# Patient Record
Sex: Female | Born: 1945 | Hispanic: No | Marital: Married | Smoking: Never smoker
Health system: Southern US, Community
[De-identification: ages and names within clinical notes are randomized; demographics above are authoritative.]

## PROBLEM LIST (undated history)

## (undated) DIAGNOSIS — J189 Pneumonia, unspecified organism: Secondary | ICD-10-CM

## (undated) DIAGNOSIS — N289 Disorder of kidney and ureter, unspecified: Secondary | ICD-10-CM

## (undated) DIAGNOSIS — R5600 Simple febrile convulsions: Secondary | ICD-10-CM

## (undated) DIAGNOSIS — K219 Gastro-esophageal reflux disease without esophagitis: Secondary | ICD-10-CM

## (undated) DIAGNOSIS — C539 Malignant neoplasm of cervix uteri, unspecified: Secondary | ICD-10-CM

## (undated) DIAGNOSIS — E785 Hyperlipidemia, unspecified: Secondary | ICD-10-CM

## (undated) DIAGNOSIS — Z8719 Personal history of other diseases of the digestive system: Secondary | ICD-10-CM

## (undated) DIAGNOSIS — M109 Gout, unspecified: Secondary | ICD-10-CM

## (undated) DIAGNOSIS — F329 Major depressive disorder, single episode, unspecified: Secondary | ICD-10-CM

## (undated) DIAGNOSIS — Z992 Dependence on renal dialysis: Secondary | ICD-10-CM

## (undated) DIAGNOSIS — M797 Fibromyalgia: Secondary | ICD-10-CM

## (undated) DIAGNOSIS — F32A Depression, unspecified: Secondary | ICD-10-CM

## (undated) DIAGNOSIS — Z9289 Personal history of other medical treatment: Secondary | ICD-10-CM

## (undated) DIAGNOSIS — A759 Typhus fever, unspecified: Secondary | ICD-10-CM

## (undated) DIAGNOSIS — E119 Type 2 diabetes mellitus without complications: Secondary | ICD-10-CM

## (undated) DIAGNOSIS — I10 Essential (primary) hypertension: Secondary | ICD-10-CM

## (undated) DIAGNOSIS — D649 Anemia, unspecified: Secondary | ICD-10-CM

## (undated) DIAGNOSIS — C649 Malignant neoplasm of unspecified kidney, except renal pelvis: Secondary | ICD-10-CM

## (undated) DIAGNOSIS — G43909 Migraine, unspecified, not intractable, without status migrainosus: Secondary | ICD-10-CM

## (undated) DIAGNOSIS — M545 Low back pain, unspecified: Secondary | ICD-10-CM

## (undated) DIAGNOSIS — J42 Unspecified chronic bronchitis: Secondary | ICD-10-CM

## (undated) DIAGNOSIS — F419 Anxiety disorder, unspecified: Secondary | ICD-10-CM

## (undated) DIAGNOSIS — G8929 Other chronic pain: Secondary | ICD-10-CM

## (undated) DIAGNOSIS — G629 Polyneuropathy, unspecified: Secondary | ICD-10-CM

## (undated) DIAGNOSIS — E215 Disorder of parathyroid gland, unspecified: Secondary | ICD-10-CM

## (undated) DIAGNOSIS — G4730 Sleep apnea, unspecified: Secondary | ICD-10-CM

## (undated) DIAGNOSIS — M810 Age-related osteoporosis without current pathological fracture: Secondary | ICD-10-CM

## (undated) HISTORY — DX: Malignant neoplasm of cervix uteri, unspecified: C53.9

## (undated) HISTORY — DX: Malignant neoplasm of unspecified kidney, except renal pelvis: C64.9

## (undated) HISTORY — PX: UPPER GI ENDOSCOPY: SHX6162

## (undated) HISTORY — DX: Age-related osteoporosis without current pathological fracture: M81.0

## (undated) HISTORY — DX: Depression, unspecified: F32.A

## (undated) HISTORY — PX: APPENDECTOMY: SHX54

## (undated) HISTORY — DX: Hyperlipidemia, unspecified: E78.5

## (undated) HISTORY — PX: BILATERAL SALPINGOOPHORECTOMY: SHX1223

## (undated) HISTORY — PX: BACK SURGERY: SHX140

## (undated) HISTORY — PX: HERNIA REPAIR: SHX51

## (undated) HISTORY — PX: CARDIAC CATHETERIZATION: SHX172

## (undated) HISTORY — PX: EYE SURGERY: SHX253

## (undated) HISTORY — DX: Fibromyalgia: M79.7

## (undated) HISTORY — DX: Essential (primary) hypertension: I10

## (undated) HISTORY — DX: Major depressive disorder, single episode, unspecified: F32.9

## (undated) HISTORY — DX: Polyneuropathy, unspecified: G62.9

## (undated) HISTORY — DX: Personal history of other medical treatment: Z92.89

## (undated) HISTORY — PX: DILATION AND CURETTAGE OF UTERUS: SHX78

---

## 1973-02-12 HISTORY — PX: DIAGNOSTIC LAPAROSCOPY: SUR761

## 1974-02-12 HISTORY — PX: TONSILLECTOMY: SUR1361

## 1975-02-13 HISTORY — PX: ABDOMINAL HYSTERECTOMY: SHX81

## 1981-02-12 HISTORY — PX: INCONTINENCE SURGERY: SHX676

## 1986-02-12 HISTORY — PX: TMJ ARTHROPLASTY: SHX1066

## 1986-02-12 HISTORY — PX: CYSTOSCOPY W/ STONE MANIPULATION: SHX1427

## 1988-10-13 HISTORY — PX: LAPAROSCOPIC CHOLECYSTECTOMY: SUR755

## 1989-02-12 HISTORY — PX: MOUTH SURGERY: SHX715

## 1994-02-12 HISTORY — PX: HIATAL HERNIA REPAIR: SHX195

## 1995-02-13 HISTORY — PX: ESOPHAGOGASTRODUODENOSCOPY (EGD) WITH ESOPHAGEAL DILATION: SHX5812

## 1998-10-14 HISTORY — PX: KNEE ARTHROSCOPY: SUR90

## 1999-01-24 ENCOUNTER — Encounter: Payer: Self-pay | Admitting: Specialist

## 1999-01-30 ENCOUNTER — Encounter (INDEPENDENT_AMBULATORY_CARE_PROVIDER_SITE_OTHER): Payer: Self-pay

## 1999-01-30 ENCOUNTER — Observation Stay (HOSPITAL_COMMUNITY): Admission: RE | Admit: 1999-01-30 | Discharge: 1999-01-31 | Payer: Self-pay | Admitting: Specialist

## 1999-01-30 ENCOUNTER — Encounter: Payer: Self-pay | Admitting: Specialist

## 1999-02-13 HISTORY — PX: LUMBAR DISC SURGERY: SHX700

## 2000-02-09 ENCOUNTER — Ambulatory Visit (HOSPITAL_COMMUNITY): Admission: RE | Admit: 2000-02-09 | Discharge: 2000-02-09 | Payer: Self-pay | Admitting: Specialist

## 2000-02-09 ENCOUNTER — Encounter: Payer: Self-pay | Admitting: Specialist

## 2000-02-13 HISTORY — PX: NEPHRECTOMY: SHX65

## 2000-06-25 ENCOUNTER — Ambulatory Visit (HOSPITAL_COMMUNITY): Admission: RE | Admit: 2000-06-25 | Discharge: 2000-06-25 | Payer: Self-pay | Admitting: Internal Medicine

## 2000-10-15 ENCOUNTER — Encounter: Payer: Self-pay | Admitting: Internal Medicine

## 2000-10-15 ENCOUNTER — Ambulatory Visit (HOSPITAL_COMMUNITY): Admission: RE | Admit: 2000-10-15 | Discharge: 2000-10-15 | Payer: Self-pay | Admitting: Internal Medicine

## 2000-10-30 ENCOUNTER — Ambulatory Visit (HOSPITAL_COMMUNITY): Admission: RE | Admit: 2000-10-30 | Discharge: 2000-10-30 | Payer: Self-pay | Admitting: Internal Medicine

## 2000-10-31 ENCOUNTER — Encounter: Payer: Self-pay | Admitting: Internal Medicine

## 2000-11-14 ENCOUNTER — Encounter: Payer: Self-pay | Admitting: Anesthesiology

## 2000-11-20 ENCOUNTER — Encounter (INDEPENDENT_AMBULATORY_CARE_PROVIDER_SITE_OTHER): Payer: Self-pay | Admitting: *Deleted

## 2000-11-20 ENCOUNTER — Inpatient Hospital Stay (HOSPITAL_COMMUNITY): Admission: RE | Admit: 2000-11-20 | Discharge: 2000-11-25 | Payer: Self-pay | Admitting: Urology

## 2000-11-22 ENCOUNTER — Encounter: Payer: Self-pay | Admitting: Internal Medicine

## 2000-12-17 ENCOUNTER — Ambulatory Visit (HOSPITAL_COMMUNITY): Admission: RE | Admit: 2000-12-17 | Discharge: 2000-12-17 | Payer: Self-pay | Admitting: Internal Medicine

## 2000-12-17 ENCOUNTER — Encounter: Payer: Self-pay | Admitting: Internal Medicine

## 2001-01-07 ENCOUNTER — Emergency Department (HOSPITAL_COMMUNITY): Admission: EM | Admit: 2001-01-07 | Discharge: 2001-01-07 | Payer: Self-pay | Admitting: Emergency Medicine

## 2001-01-07 ENCOUNTER — Encounter: Payer: Self-pay | Admitting: Emergency Medicine

## 2001-02-11 ENCOUNTER — Ambulatory Visit (HOSPITAL_COMMUNITY): Admission: RE | Admit: 2001-02-11 | Discharge: 2001-02-11 | Payer: Self-pay | Admitting: Internal Medicine

## 2001-02-11 ENCOUNTER — Encounter: Payer: Self-pay | Admitting: Internal Medicine

## 2001-02-13 ENCOUNTER — Ambulatory Visit (HOSPITAL_COMMUNITY): Admission: RE | Admit: 2001-02-13 | Discharge: 2001-02-13 | Payer: Self-pay | Admitting: Internal Medicine

## 2001-02-13 ENCOUNTER — Encounter: Payer: Self-pay | Admitting: Internal Medicine

## 2001-02-27 ENCOUNTER — Ambulatory Visit (HOSPITAL_COMMUNITY): Admission: RE | Admit: 2001-02-27 | Discharge: 2001-02-27 | Payer: Self-pay | Admitting: Internal Medicine

## 2001-02-27 ENCOUNTER — Encounter: Payer: Self-pay | Admitting: Internal Medicine

## 2001-04-15 ENCOUNTER — Encounter: Payer: Self-pay | Admitting: Internal Medicine

## 2001-04-15 ENCOUNTER — Ambulatory Visit (HOSPITAL_COMMUNITY): Admission: RE | Admit: 2001-04-15 | Discharge: 2001-04-15 | Payer: Self-pay | Admitting: Internal Medicine

## 2001-07-31 ENCOUNTER — Emergency Department (HOSPITAL_COMMUNITY): Admission: EM | Admit: 2001-07-31 | Discharge: 2001-07-31 | Payer: Self-pay | Admitting: Emergency Medicine

## 2001-07-31 ENCOUNTER — Encounter: Payer: Self-pay | Admitting: Emergency Medicine

## 2001-08-12 ENCOUNTER — Ambulatory Visit (HOSPITAL_COMMUNITY): Admission: RE | Admit: 2001-08-12 | Discharge: 2001-08-12 | Payer: Self-pay | Admitting: Internal Medicine

## 2001-08-12 ENCOUNTER — Encounter: Payer: Self-pay | Admitting: Internal Medicine

## 2002-01-19 ENCOUNTER — Inpatient Hospital Stay (HOSPITAL_COMMUNITY): Admission: AD | Admit: 2002-01-19 | Discharge: 2002-01-21 | Payer: Self-pay | Admitting: *Deleted

## 2002-01-20 ENCOUNTER — Encounter: Payer: Self-pay | Admitting: *Deleted

## 2002-02-26 ENCOUNTER — Encounter: Payer: Self-pay | Admitting: Internal Medicine

## 2002-02-26 ENCOUNTER — Encounter: Admission: RE | Admit: 2002-02-26 | Discharge: 2002-02-26 | Payer: Self-pay | Admitting: Internal Medicine

## 2002-07-29 ENCOUNTER — Encounter (HOSPITAL_COMMUNITY): Admission: RE | Admit: 2002-07-29 | Discharge: 2002-08-28 | Payer: Self-pay | Admitting: Internal Medicine

## 2002-08-05 ENCOUNTER — Emergency Department (HOSPITAL_COMMUNITY): Admission: EM | Admit: 2002-08-05 | Discharge: 2002-08-05 | Payer: Self-pay | Admitting: *Deleted

## 2002-08-05 ENCOUNTER — Encounter: Payer: Self-pay | Admitting: *Deleted

## 2003-04-07 ENCOUNTER — Encounter: Admission: RE | Admit: 2003-04-07 | Discharge: 2003-04-07 | Payer: Self-pay | Admitting: Internal Medicine

## 2003-05-11 ENCOUNTER — Ambulatory Visit (HOSPITAL_COMMUNITY): Admission: RE | Admit: 2003-05-11 | Discharge: 2003-05-11 | Payer: Self-pay | Admitting: Internal Medicine

## 2003-12-21 ENCOUNTER — Ambulatory Visit (HOSPITAL_COMMUNITY): Admission: RE | Admit: 2003-12-21 | Discharge: 2003-12-21 | Payer: Self-pay | Admitting: Internal Medicine

## 2004-02-02 ENCOUNTER — Ambulatory Visit (HOSPITAL_BASED_OUTPATIENT_CLINIC_OR_DEPARTMENT_OTHER): Admission: RE | Admit: 2004-02-02 | Discharge: 2004-02-02 | Payer: Self-pay | Admitting: Specialist

## 2004-02-02 ENCOUNTER — Ambulatory Visit (HOSPITAL_COMMUNITY): Admission: RE | Admit: 2004-02-02 | Discharge: 2004-02-02 | Payer: Self-pay | Admitting: Specialist

## 2004-04-18 ENCOUNTER — Encounter: Admission: RE | Admit: 2004-04-18 | Discharge: 2004-04-18 | Payer: Self-pay | Admitting: Internal Medicine

## 2004-05-16 ENCOUNTER — Ambulatory Visit (HOSPITAL_COMMUNITY): Admission: RE | Admit: 2004-05-16 | Discharge: 2004-05-16 | Payer: Self-pay | Admitting: General Surgery

## 2004-12-15 ENCOUNTER — Encounter (HOSPITAL_COMMUNITY): Admission: RE | Admit: 2004-12-15 | Discharge: 2005-01-14 | Payer: Self-pay | Admitting: Neurology

## 2005-02-23 ENCOUNTER — Ambulatory Visit (HOSPITAL_COMMUNITY): Admission: RE | Admit: 2005-02-23 | Discharge: 2005-02-23 | Payer: Self-pay | Admitting: Pediatrics

## 2005-03-01 ENCOUNTER — Inpatient Hospital Stay (HOSPITAL_COMMUNITY): Admission: AD | Admit: 2005-03-01 | Discharge: 2005-03-02 | Payer: Self-pay | Admitting: *Deleted

## 2006-10-12 ENCOUNTER — Emergency Department (HOSPITAL_COMMUNITY): Admission: EM | Admit: 2006-10-12 | Discharge: 2006-10-12 | Payer: Self-pay | Admitting: Emergency Medicine

## 2007-01-31 ENCOUNTER — Encounter: Admission: RE | Admit: 2007-01-31 | Discharge: 2007-01-31 | Payer: Self-pay | Admitting: Internal Medicine

## 2007-05-14 ENCOUNTER — Ambulatory Visit (HOSPITAL_COMMUNITY): Admission: RE | Admit: 2007-05-14 | Discharge: 2007-05-14 | Payer: Self-pay | Admitting: Internal Medicine

## 2007-06-10 ENCOUNTER — Encounter: Admission: RE | Admit: 2007-06-10 | Discharge: 2007-06-10 | Payer: Self-pay | Admitting: Internal Medicine

## 2008-02-03 ENCOUNTER — Ambulatory Visit (HOSPITAL_COMMUNITY): Admission: RE | Admit: 2008-02-03 | Discharge: 2008-02-03 | Payer: Self-pay | Admitting: Internal Medicine

## 2008-04-02 ENCOUNTER — Ambulatory Visit (HOSPITAL_COMMUNITY): Admission: RE | Admit: 2008-04-02 | Discharge: 2008-04-02 | Payer: Self-pay | Admitting: Internal Medicine

## 2008-06-01 ENCOUNTER — Ambulatory Visit (HOSPITAL_COMMUNITY): Admission: RE | Admit: 2008-06-01 | Discharge: 2008-06-01 | Payer: Self-pay | Admitting: Internal Medicine

## 2009-02-09 ENCOUNTER — Ambulatory Visit (HOSPITAL_COMMUNITY): Admission: RE | Admit: 2009-02-09 | Discharge: 2009-02-09 | Payer: Self-pay | Admitting: Internal Medicine

## 2009-11-14 ENCOUNTER — Ambulatory Visit (HOSPITAL_COMMUNITY): Admission: RE | Admit: 2009-11-14 | Discharge: 2009-11-14 | Payer: Self-pay | Admitting: Internal Medicine

## 2009-11-17 ENCOUNTER — Ambulatory Visit: Admission: RE | Admit: 2009-11-17 | Discharge: 2009-11-17 | Payer: Self-pay | Admitting: Internal Medicine

## 2009-11-17 ENCOUNTER — Encounter: Payer: Self-pay | Admitting: Pulmonary Disease

## 2009-12-28 ENCOUNTER — Ambulatory Visit: Payer: Self-pay | Admitting: Internal Medicine

## 2009-12-28 ENCOUNTER — Ambulatory Visit (HOSPITAL_COMMUNITY): Admission: RE | Admit: 2009-12-28 | Discharge: 2009-12-28 | Payer: Self-pay | Admitting: Internal Medicine

## 2010-01-10 ENCOUNTER — Ambulatory Visit: Payer: Self-pay | Admitting: Pulmonary Disease

## 2010-01-10 DIAGNOSIS — G4733 Obstructive sleep apnea (adult) (pediatric): Secondary | ICD-10-CM

## 2010-01-10 DIAGNOSIS — E785 Hyperlipidemia, unspecified: Secondary | ICD-10-CM

## 2010-01-10 DIAGNOSIS — I10 Essential (primary) hypertension: Secondary | ICD-10-CM

## 2010-02-07 ENCOUNTER — Telehealth: Payer: Self-pay | Admitting: Pulmonary Disease

## 2010-02-12 HISTORY — PX: GASTRIC BYPASS: SHX52

## 2010-02-22 ENCOUNTER — Ambulatory Visit (HOSPITAL_COMMUNITY)
Admission: RE | Admit: 2010-02-22 | Discharge: 2010-02-22 | Payer: Self-pay | Source: Home / Self Care | Attending: Internal Medicine | Admitting: Internal Medicine

## 2010-03-04 ENCOUNTER — Encounter: Payer: Self-pay | Admitting: Specialist

## 2010-03-05 ENCOUNTER — Encounter: Payer: Self-pay | Admitting: Internal Medicine

## 2010-03-09 ENCOUNTER — Telehealth: Payer: Self-pay | Admitting: Pulmonary Disease

## 2010-03-09 ENCOUNTER — Ambulatory Visit
Admission: RE | Admit: 2010-03-09 | Discharge: 2010-03-09 | Payer: Self-pay | Source: Home / Self Care | Attending: Pulmonary Disease | Admitting: Pulmonary Disease

## 2010-03-09 DIAGNOSIS — J309 Allergic rhinitis, unspecified: Secondary | ICD-10-CM | POA: Insufficient documentation

## 2010-03-14 NOTE — Assessment & Plan Note (Signed)
Summary: consult for management of osa   Visit Type:  Initial Consult Copy to:  [name] MD Primary Provider/Referring Provider:  [name] MD  CC:  Sleep Consult. Pt states she can't sleep atleast one night a week and stays up all night.  History of Present Illness: the pt is a 65y/o female who I have been asked to see for management of osa.  She has had multiple sleep studies in the past that are not available, but most recently diagnosed with moderate osa this year at [facility].  She had an AHI of 29/hr with desat to 83% as part of a split night study.  She was titrated to a pressure of 17cm, but continued to have breakthru events.  The pt has been noted to have loud snoring, but no one has ever commented on an abnormal breathing pattern during sleep.  She goes to bed btw 1-4am by choice, and arises 8-9 am to start her day.  She has frequent awakenings at night, as well as gasping arousals.  She does not feel rested in the am's upon arising.  She notes definite sleep pressure during the day with reading or watching tv.  She will often take naps during the day, but denies any issues with sleepiness while driving.  Her weight is actually down 25 pounds over the last 2 years.  Preventive Screening-Counseling & Management  Alcohol-Tobacco     Smoking Status: never  Current Medications (verified): 1)  Furosemide 80 Mg Tabs (Furosemide) .Marland Kitchen.. 1 Two Times A Day 2)  Micardis 80 Mg Tabs (Telmisartan) .... Once Daily 3)  Atenolol 100 Mg Tabs (Atenolol) .Marland Kitchen.. 1 Two Times A Day 4)  Cymbalta 60 Mg Cpep (Duloxetine Hcl) .... Once Daily 5)  Allopurinol 300 Mg Tabs (Allopurinol) .... Once Daily 6)  Crestor 20 Mg Tabs (Rosuvastatin Calcium) .... Once Daily 7)  Flexeril 10 Mg Tabs (Cyclobenzaprine Hcl) .Marland Kitchen.. 1 Three Times A Day 8)  Imdur 30 Mg Xr24h-Tab (Isosorbide Mononitrate) .... Once Daily 9)  Verapamil Hcl Cr 240 Mg Xr24h-Cap (Verapamil Hcl) .... Once Daily 10)  Victoza 1.8 Mg .... Once Daily For  Diabetes 11)  Lantus 100 Unit/ml Soln (Insulin Glargine) .... 70 Units Before Breakfast and 80 Before Dinner 12)  Novolog 100 Unit/ml Soln (Insulin Aspart) .... 5 Units Before Meals 13)  Aspirin 81 Mg Tabs (Aspirin) .Marland Kitchen.. 1 Two Times A Day 14)  Daily Multiple Vitamins  Tabs (Multiple Vitamin) .... Once Daily 15)  Vitamin B-12 1000 Mcg Tabs (Cyanocobalamin) .... Once Daily 16)  Niacin Cr 1000 Mg Cr-Tabs (Niacin) .Marland Kitchen.. 1 Two Times A Day 17)  Omega-3 1000 Mg Caps (Omega-3 Fatty Acids) .Marland Kitchen.. 1 Two Times A Day  Allergies (verified): 1)  ! Vicodin 2)  ! Demerol 3)  ! Morphine 4)  ! Codeine 5)  ! Naprosyn 6)  ! * Dilaudid  Past History:  Past Medical History: Diabetes Hyperlipidemia Hypertension Angina kidney cancer--s/p right nephrectomy cervical cancer  Past Surgical History: Cholecystectomy back surgery partial hysterectomy x2 Appendectomy TMJ surgery reflux surgery D&C X2 tonsillectomy right nephrectomy  Family History: Reviewed history and no changes required. emphysema--father allergies--whole family heart disease--father clotting disorders--mother lung cancer--maternal uncle  Social History: Reviewed history and no changes required. Patient never smoked.  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  The patient denies shortness of breath at rest, coughing up blood, irregular heartbeats, acid heartburn, indigestion, loss of appetite, weight change, tooth/dental problems, joint stiffness or pain, rash, and fever.    Vital Signs:  Patient profile:   65 year old female Height:      63 inches Weight:      265  pounds BMI:     47.11 O2 Sat:      94 % on Room air Temp:     98 degrees F oral Pulse rate:   97 / minute BP sitting:   160 / 92  Vitals Entered By: Charma Igo (January 10, 2010 9:55 AM)  O2 Flow:  Room air  Physical Exam  General:  obese female in nad Eyes:  PERRLA and EOMI.   Nose:  deviation to right with narrowing left patent Mouth:  small oral cavity.Marland Kitchennormal uvula and palate. Neck:  large, no jvd, tmg, LN Lungs:  clear to auscultation Heart:  rrr, no mrg Abdomen:  soft and nontender, bs+ Extremities:  mild edema, no cyanosis  pulses intact distally Neurologic:  alert and oriented,moves all 4. does not appear overly sleepy   Impression & Recommendations:  Problem # 1:  OBSTRUCTIVE SLEEP APNEA (ICD-327.23) the pt has moderate osa by her most recent sleep study, and has significant hypersomnia which is greatly impacting her QOL.  I have also discussed with her the CV effects as well.  Her best treatment options at this point are cpap while working on weight loss.  She is agreeable to trying.  I will set the patient up on cpap at a moderate pressure level to allow for desensitization, and will troubleshoot the device over the next 4-6weeks if needed.  The pt is to call me if having issues with tolerance.  Will then optimize the pressure once patient is able to wear cpap on a consistent basis.  Medications Added to Medication List This Visit: 1)  Furosemide 80 Mg Tabs (Furosemide) .Marland Kitchen.. 1 two times a day 2)  Micardis 80 Mg Tabs (Telmisartan) .... Once daily 3)  Atenolol 100 Mg Tabs (Atenolol) .Marland Kitchen.. 1 two times a day 4)  Cymbalta 60 Mg Cpep (Duloxetine hcl) .... Once daily 5)  Allopurinol 300 Mg Tabs (Allopurinol) .... Once daily 6)  Crestor 20 Mg Tabs (Rosuvastatin calcium) .... Once daily 7)  Flexeril 10 Mg Tabs (Cyclobenzaprine hcl) .Marland Kitchen.. 1 three times a day 8)  Imdur 30 Mg Xr24h-tab (Isosorbide mononitrate) .... Once daily 9)  Verapamil Hcl Cr 240 Mg Xr24h-cap (Verapamil  hcl) .... Once daily 10)  Victoza 1.8 Mg  .... Once daily for diabetes 11)  Lantus 100 Unit/ml Soln (Insulin glargine) .... 70 units before breakfast and 80 before dinner 12)  Novolog 100 Unit/ml Soln (Insulin aspart) .... 5 units before meals 13)  Aspirin 81 Mg Tabs (Aspirin) .Marland Kitchen.. 1 two times a day 14)  Daily Multiple Vitamins Tabs (Multiple vitamin) .... Once daily 15)  Vitamin B-12 1000 Mcg Tabs (Cyanocobalamin) .... Once daily 16)  Niacin Cr 1000 Mg Cr-tabs (Niacin) .Marland Kitchen.. 1 two times a day 17)  Omega-3 1000 Mg Caps (Omega-3 fatty acids) .Marland Kitchen.. 1 two times a day  Other Orders: Consultation Level IV [id number]) DME Referral (DME)  Patient Instructions: 1)  will set up on cpap at a moderate pressure level.  Please call if having issues with tolerance 2)  work on  weight loss 3)  followup with me in 5 weeks.

## 2010-03-16 NOTE — Progress Notes (Signed)
Summary: cpap titration ordered-[id number]  Phone Note From Other Clinic Call back at [phone]   Caller: [facility] Call For: Clance Summary of Call: Mariann Laster from [facility] called on triage line and states that the pt has not been tolerating her cpap well. She states she is feelin glike she is suffocating and she keeps pulling her mask off at night. Mariann Laster states that the current setting is on 10, but auto titration showed optimal [pressure was 17. Mariann Laster wanted to know if [facility] would write an order to have pt pressure changed to 17. Please advise.   Initial call taken by: [place] Bing CMA,  February 07, 2010 3:14 PM  Follow-up for Phone Call        why did she do an autotitration on the patient without an order??? pt was supposed to call me if she had issues with tolerance leading up to my visit.   need auto for 2 weeks with download to me...change pt's apptm so that I see her 2 weeks from now. Follow-up by: Kathee Delton MD,  February 08, 2010 6:15 PM  Additional Follow-up for Phone Call Additional follow up Details #1::        [facility] for [place] with [place] to return my call. Phillips Grout  February 09, 2010 9:46 AM  Mariann Laster with [facility] returned call and stated that they did not do a titration study, she was speaking of the sleep study that was done in the sleep lab. She stated on the sleep study report it stated pt was titrated to a pressure of 17 cm. I advised Mariann Laster that any of our patients that call having issues, she needs to advise the patients to call us with any issues they are having. Mariann Laster stated that she did instruct pt to do so, however, pt is not the one that called.  Please send order to pcc box for CPAP Auto x 2 wks.Phillips Grout  February 09, 2010 9:55 AM     Additional Follow-up for Phone Call Additional follow up Details #2::    Order for autotitrate sent to [facility]. [id number].  Need to resch appt w/ KC in 2-3 weeks to discuss  results.Francesca Jewett CMA  February 09, 2010 10:34 AM  LMTCBx2. to r/s appt with [place] CMA  February 10, 2010 11:52 AM  Pt returned call and is aware of cpap titration study to be arranged by [facility]. Return appt scheduled with Dr. Gwenette Greet for 03/09/10 at 9:30. Pt aware.Phillips Grout  February 10, 2010 2:38 PM

## 2010-03-16 NOTE — Assessment & Plan Note (Signed)
Summary: rov for osa   Copy to:  [name] MD Primary Provider/Referring Provider:  [name] MD  CC:  follow up. Pt states she wears her cpap everynight x 5 hrs a night. Pt states she has no problems with machine/mask. Pt states she can tell it helps her sleep better. Marland Kitchen  History of Present Illness: the pt comes in today for f/u of her osa.  She was started on cpap at 10cm last visit to allow for desensitization, but had an issue tolerating this.  She was subsequently changed over to auto mode, and has done very well.  She feels this is much more comfortable, and is able to wear consistently.  A recent download showed 100% compliance greater than 4 hours, and her pressure ranged as high as 19cm.  She is having no issues with her mask fit at this time.  Current Medications (verified): 1)  Furosemide 80 Mg Tabs (Furosemide) .Marland Kitchen.. 1 Two Times A Day 2)  Micardis 80 Mg Tabs (Telmisartan) .... Once Daily 3)  Atenolol 100 Mg Tabs (Atenolol) .Marland Kitchen.. 1 Two Times A Day 4)  Cymbalta 60 Mg Cpep (Duloxetine Hcl) .... Once Daily 5)  Allopurinol 300 Mg Tabs (Allopurinol) .... Once Daily 6)  Crestor 20 Mg Tabs (Rosuvastatin Calcium) .... Once Daily 7)  Flexeril 10 Mg Tabs (Cyclobenzaprine Hcl) .Marland Kitchen.. 1 Three Times A Day 8)  Imdur 30 Mg Xr24h-Tab (Isosorbide Mononitrate) .... Once Daily 9)  Verapamil Hcl Cr 240 Mg Xr24h-Cap (Verapamil Hcl) .... Once Daily 10)  Victoza 1.8 Mg .... Once Daily For Diabetes 11)  Lantus 100 Unit/ml Soln (Insulin Glargine) .... 70 Units Before Breakfast and 80 Before Dinner 12)  Novolog 100 Unit/ml Soln (Insulin Aspart) .... 5 Units Before Meals 13)  Aspirin 81 Mg Tabs (Aspirin) .Marland Kitchen.. 1 Two Times A Day 14)  Daily Multiple Vitamins  Tabs (Multiple Vitamin) .... Once Daily 15)  Vitamin B-12 1000 Mcg Tabs (Cyanocobalamin) .... Once Daily 16)  Niacin Cr 1000 Mg Cr-Tabs (Niacin) .Marland Kitchen.. 1 Two Times A Day 17)  Omega-3 1000 Mg Caps (Omega-3 Fatty Acids) .Marland Kitchen.. 1 Two Times A Day  Allergies  (verified): 1)  ! Vicodin 2)  ! Demerol 3)  ! Morphine 4)  ! Codeine 5)  ! Naprosyn 6)  ! * Dilaudid  Past History:  Past medical, surgical, family and social histories (including risk factors) reviewed, and no changes noted (except as noted below).  Past Medical History: Reviewed history from 01/10/2010 and no changes required. Diabetes Hyperlipidemia Hypertension Angina kidney cancer--s/p right nephrectomy cervical cancer  Past Surgical History: Reviewed history from 01/10/2010 and no changes required. Cholecystectomy back surgery partial hysterectomy x2 Appendectomy TMJ surgery reflux surgery D&C X2 tonsillectomy right nephrectomy  Family History: Reviewed history from 01/10/2010 and no changes required. emphysema--father allergies--whole family heart disease--father clotting disorders--mother lung cancer--maternal uncle  Social History: Reviewed history from 01/10/2010 and no changes required. Patient never smoked.  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  The patient denies shortness of breath at rest, coughing up blood, chest pain, irregular heartbeats, acid heartburn, indigestion, loss of appetite, weight change, abdominal pain, tooth/dental problems, headaches, sneezing, itching, ear ache, anxiety, depression, rash, change in color of mucus, and fever.    Vital Signs:  Patient profile:   65  year old female Height:      63 inches Weight:      273.50 pounds BMI:     48.62 O2 Sat:      96 % on Room air Temp:     98.1 degrees F oral Pulse rate:   75 / minute BP sitting:   110 / 78  (left arm) Cuff size:   large  Vitals Entered By: Charma Igo (March 09, 2010 9:26 AM)  O2 Flow:  Room air CC: follow  up. Pt states she wears her cpap everynight x 5 hrs a night. Pt states she has no problems with machine/mask. Pt states she can tell it helps her sleep better.  Comments meds and allergies updated Charma Igo  March 09, 2010 9:26 AM Phone number updated    Physical Exam  General:  obese female in nad Nose:  swollen turbs bilat with partial obstruction bilat no purulence seen Mouth:  clear, no exudates. Lungs:  clear Heart:  rrr Extremities:  mild edema, no cyanosis  Neurologic:  alert, does not appear sleepy, moves all 4.   Impression & Recommendations:  Problem # 1:  OBSTRUCTIVE SLEEP APNEA (ICD-327.23)  the pt is doing much better on auto mode on her cpap machine.  She feels this is much more comfortable, and is wearing very compliantly by her recent download.  I have asked her to continue with this, and to work on weight loss.  Problem # 2:  ALLERGIC RHINITIS (ICD-477.9)  the pt has large swollen turbinates with nasal obstruction.  I would like for her to try nasonex to see if will help.  I also asked her to turn up the heat on her humidifier if she feels she needs more moisture.  Other Orders: Est. Patient Level IV [id number]) DME Referral (DME)  Patient Instructions: 1)  will send an order to [place] to leave your machine on auto. 2)  trial of nasonex 2 each nostril with head leaned forward each am..let me know if helps and can send in script. 3)  turn up heat on humidifier if you need more moisture 4)  work on weight loss 5)  followup with me in 96mos.   Immunization History:  Influenza Immunization History:    Influenza:  historical (11/12/2009)  Pneumovax Immunization History:    Pneumovax:  historical (11/13/2007)

## 2010-03-16 NOTE — Progress Notes (Signed)
Summary: [facility] needs office notes for cpap  Phone Note From Pharmacy   Caller: tammy with [facility] Call For: dr Uthman Mroczkowski  Summary of Call: Tammy with [facility] phoned regarding Ms Stones visit today. She wants to know when the note will be ready. She needs office notes regarding patients CPAP. Tammy can be reached at [phone] fax [phone] Initial call taken by: Ozella Rocks,  March 09, 2010 1:37 PM  Follow-up for Phone Call        Will forward to [facility] per her request.  Tilden Dome  March 09, 2010 4:09 PM  Called and spoke with Tammy at [facility] and explained that we could not fax any ov notes. Advised that pt was in for her f/u on her cpap and that pt was doing well. Phillips Grout  March 09, 2010 5:13 PM

## 2010-03-29 ENCOUNTER — Encounter: Payer: Self-pay | Admitting: Pulmonary Disease

## 2010-04-11 NOTE — Letter (Signed)
Summary: CPAP Order / [facility]  CPAP Order / [place] By: Rise Patience 04/07/2010 11:49:35  _____________________________________________________________________  External Attachment:    Type:   Image     Comment:   External Document

## 2010-06-30 NOTE — H&P (Signed)
Margaret Huerta, Margaret Huerta                 ACCOUNT NO.:  [id number]   MEDICAL RECORD NO.:  [id number]          PATIENT TYPE:  AMB   LOCATION:                                 FACILITY:   PHYSICIAN:  Jamesetta So, M.D.  DATE OF BIRTH:  April 07, 1945   DATE OF ADMISSION:  05/16/2004  DATE OF DISCHARGE:  LH                                HISTORY & PHYSICAL   CHIEF COMPLAINT:  1.  Change in bowel habits.  2.  Raspy voice.   HISTORY OF PRESENT ILLNESS:  The patient is a 65 year old white female who  is referred for endoscopic evaluation.  She needs an EGD and colonoscopy for  change in bowel habits and a sore throat.  She has been having episodes of  diarrhea and constipation over the past few weeks.  She last had a  colonoscopy in 2002.  No abdominal pain, weight loss, nausea, vomiting,  melena or hematochezia have been noted.  She last had a colonoscopy in 2002.  She has also had a raspy voice since December of 2005.  She was seen by an  ENT which told her she had reflux disease.  She is status post a Nissan  fundoplication in [id number].  She does not feel that this reflux disease.  There  is no family history of colon carcinoma.   PAST MEDICAL HISTORY:  1.  Gout.  2.  Hypertension.  3.  Kidney disease.  4.  Migraines.  5.  High cholesterol.  6.  Diabetes mellitus.   PAST SURGICAL HISTORY:  Extensive and includes:  1.  Left knee arthroscopy.  2.  Right radical nephrectomy.  3.  Back surgery.  4.  Hysterectomy.  5.  Tonsillectomy.  6.  Cholecystectomy.  7.  Laparoscopic Nissan fundoplication.   CURRENT MEDICATIONS:  Rocaltrol, sodium bicarbonate, Lasix, Rhinocort,  Benicar, atenolol, Prevacid, Cymbalta, allopurinol, Niaspan, Zyrtec, Midrin,  Vytorin, Lantus injections, __________, Darvocet.   ALLERGIES:  1.  VICODIN.  2.  DEMEROL.  3.  MORPHINE.  4.  CODEINE.  5.  NAPROSYN.  6.  DILAUDID.   REVIEW OF SYSTEMS:  The patient does not drink or smoke.   PHYSICAL EXAMINATION:   GENERAL APPEARANCE:  The patient is a well-developed,  well-nourished, white female in no acute distress.  VITAL SIGNS:  She is afebrile and vital signs are stable.  LUNGS:  Clear to auscultation with equal breath sounds bilaterally.  HEART:  Regular rate and rhythm without S3, S4 or murmurs.  ABDOMEN:  Soft, nontender and nondistended.  No hepatosplenomegaly or masses  are noted.  RECTAL:  Examination was deferred to the procedure.   IMPRESSION:  .  1.  Gastrointestinal reflux disease.  2.  Diarrhea/change in bowel habits.   PLAN:  The patient is scheduled for an EGD and colonoscopy on May 16, 2004.  The risks and benefits of the procedure, including bleeding and perforation,  were fully explained to the patient, who gave informed consent.      MAJ/MEDQ  D:  04/27/2004  T:  04/27/2004  Job:  [id number]  cc:   [facility]  Fax: [place] Marnette Burgess, M.D.  [address]  [place]  [place] [address]  Fax: [place] [address], [place]  [place] [address]  Fax: [phone]

## 2010-06-30 NOTE — H&P (Signed)
[facility]  Patient:    Margaret Huerta, Margaret Huerta Visit Number: [id number] MRN: [id number]          Service Type: SUR Location: 1S X003 01 Attending Physician:  Deeann Cree Dictated by:   Lucina Mellow. Terance Hart, M.D. Admit Date:  11/20/2000                           History and Physical  HISTORY OF PRESENT ILLNESS:  This 65 year old white female was seen by me in consultation when she was sent by her internist in [place], [place], for evaluation of a suspected renal mass.  A right renal ultrasound was done at [facility] because she had some fleeting right-sided abdominal pain and a 3.5 cm complex lesion was seen in the mid section of the right kidney.  A CT scan with and without contrast on October 15, 2000, showed a 3.5 cm solid mass consistent with renal cell carcinoma in the mid section of the right kidney.  There was no evidence of metastatic disease. The left kidney was normal.  Pelvic CT was negative.  This lesion did not appear to be amenable to a partial nephrectomy and therefore she was scheduled for radical nephrectomy.  She was advised about the risks of pulmonary problems, DVT, hemorrhage, etc.  She was cleared medically by her internist, Paula Compton. Claudia Pollock, M.D., who had a Cardiolite done and it was normal.  PAST MEDICAL HISTORY:  Hypertension, diabetes, dyspepsia, migraine headaches, irritable bowel syndrome, menstrual abnormalities, and cervical carcinoma. She has also had rheumatic fever and gout.  MEDICATIONS:  1. Zestoretic 20/25 mg daily.  2. Zyrtec 10 mg daily.  3. Estraderm 0.5 mg patch twice weekly.  4. Glucophage 1000 mg b.i.d., which apparently she stopped five days     preoperatively.  5. Prilosec 20 mg b.i.d.  6. Atenolol 25 mg daily.  7. Glucotrol XL 5 mg daily.  8. Lasix 40 mg daily.  9. Nizoral shampoo twice a week. 10. Midrin as needed for migraine headaches along with Imitrex 50 mg and    Indocin 500 mg when her migraine headaches flare up. 11. Multivitamin preparation.  ALLERGIES:  She does not tolerate VICODIN, DEMEROL, CODEINE, MORPHINE, or NAPROSYN.  PAST SURGICAL HISTORY:  D&C, diagnostic laparoscopy, tonsillectomy, hysterectomy, bladder suspension, bilateral salpingo-oophorectomy, cystoscopic Hail removal, TMJ surgery, dental implants, cholecystectomy, hiatal hernia repair, esophageal dilatation, and sigmoidoscopy.  SOCIAL HISTORY:  Tobacco:  None.  Alcohol:  None.  FAMILY HISTORY:  Positive for heart disease, hypertension, diabetes, and kidney stones.  REVIEW OF SYSTEMS:  Noted on her health history form.  PHYSICAL EXAMINATION:  A heavy-set white female in no acute distress.  She is alert and oriented.  VITAL SIGNS:  Blood pressure 128/78, pulse 82, temperature 96.8 degrees.  SKIN:  Warm and dry.  NECK:  Supple.  CHEST:  Clear to auscultation.  HEART:  The heart tones are regular.  ABDOMEN:  Obese, soft, and nontender.  EXTREMITIES:  No edema.  IMPRESSION: 1. Right renal cell carcinoma. 2. Diabetes. 3. Hypertension. 4. Dyspepsia. 5. Migraine headaches. 6. Irritable bowel syndrome. 7. Gout.  PLAN:  Right radical nephrectomy. Dictated by:   Lucina Mellow. Terance Hart, M.D. Attending Physician:  Deeann Cree DD:  11/20/00 TD:  11/20/00 Job: [id number]

## 2010-06-30 NOTE — Discharge Summary (Signed)
Margaret Huerta, Margaret Huerta                 ACCOUNT NO.:  [id number]   MEDICAL RECORD NO.:  [id number]          PATIENT TYPE:  INP   LOCATION:  [id number]                         FACILITY:  [facility]   PHYSICIAN:  Leslye Peer, MD       DATE OF BIRTH:  07-06-1945   DATE OF ADMISSION:  03/01/2005  DATE OF DISCHARGE:  03/02/2005                                 DISCHARGE SUMMARY   ADMISSION DIAGNOSES:  1.  Chest pain with positive Cardiolite.  2.  Hypertension.  3.  Diabetes mellitus, insulin-dependent.  4.  Hyperlipidemia.  5.  Gastroesophageal reflux disease.  6.  History of right nephrectomy, status post renal cell carcinoma.  7.  Cervical cancer, status post resection.  8.  History of back surgery/total abdominal hysterectomy/bilateral salpingo-      oophorectomy/medial meniscus repair and chondroplasty of the patella.  9.  Renal insufficiency with a baseline creatinine of 2.0.   DISCHARGE DIAGNOSES:  1.  Chest pain with positive Cardiolite.  2.  Hypertension.  3.  Diabetes mellitus, insulin-dependent.  4.  Hyperlipidemia.  5.  Gastroesophageal reflux disease.  6.  History of right nephrectomy, status post renal cell carcinoma.  7.  Cervical cancer, status post resection.  8.  History of back surgery/total abdominal hysterectomy/bilateral salpingo-      oophorectomy/medial meniscus repair and chondroplasty of the patella.  9.  Renal insufficiency with a baseline creatinine of 2.   PROCEDURES:  None.   BRIEF HISTORY:  The patient is a 65 year old female with a very complicated  medical history who presented to Dr. Mathis Bud in November of 2006.  She had  previously undergone cardiac catheterization back in 2003 at which time she  had mild left atrial dilatation, mild LVH, normal systolic function, minimal  aortic sclerosis, trivial MR, and mild MAC.  Stress test was positive.  The  EF was 74%.  She had a catheterization by Dr. Nadyne Coombes at that time with normal  coronary arteries with an elevated  left ventricular end-diastolic pressure,  moderate pulmonary hypertension.  Right heart pressures were also checked at  that time and revealed RV pressures of 50/13 with a wedge of 23.  She was  recently referred back to Dr. Mathis Bud with chest pain which had been  ongoing for six months located in the left upper chest, radiation to both of  her arms.  It was associated with shortness of breath, frequently nauseated,  so this is not helpful with regard to her symptoms.  She does have  occasional diaphoresis with chest discomfort.  She has used nitroglycerin  and reported that no results were obtained.  Because of that, she was  admitted early for hydration and cardiac catheterization.  The patient has a  history of prior renal cell carcinoma with prior nephrectomy. She has one  kidney with a baseline creatinine of 2.  Because of this, she was admitted  for elective catheterization today.   CURRENT MEDICATIONS OFF OUR LIST:  1.  Rocaltrol 0.25 mcg daily.  2.  Lasix 40 mg b.i.d.  3.  Benicar 40 mg daily.  4.  Atenolol 100 mg b.i.d.  5.  Prevacid 30 mg b.i.d.  6.  Cymbalta 60 mg daily.  7.  Allopurinol 300 mg daily.  8.  Niaspan 500 mg b.i.d.  9.  Zyrtec 10 mg daily.  10. Midrin 1 tablet q.4 h. p.r.n.  11. Crestor 20 mg daily.  12. Lantus 40 units a.m. and 55 units p.m.  13. Byetta 10 mcg b.i.d.  14. Aspirin 81 mg daily.  15. Multivitamin daily.  16. Calcium supplements daily.   ALLERGIES:  NAPROSYN, IV DYE, VICODIN, DEMEROL, MORPHINE, CODEINE, DILAUDID.  The Naprosyn causes GI upset.  Vicodin and Demerol cause anaphylaxis.  Morphine causes vomiting.  Codeine causes hallucinations, and Dilaudid  causes rash and itching.   HOSPITAL COURSE:  The patient was admitted for catheterization for early  hydration.  After some consideration, she decided she did not want to have a  catheterization, did not want to risk her remaining kidney at this point.  Catheterization was cancelled, and  we requested a nephrology consult prior  to her departure.  It is now 3:30.  Renal has not had the opportunity to see  her.  She does have a kidney doctor/nephrologist in [place] and says  she would like to go back to see him and go one home.  Her husband is  anxious to return to [place].  We will subsequently discharge the patient  home at this time.   DISCHARGE MEDICATIONS:  As listed above.   FOLLOW UP:  We will see her back in the office with Dr. Mathis Bud in  approximately two weeks.  She is to contact her renal physician/nephrologist  in [facility] for a follow-up appointment next week.  She is currently  having no chest pain.      Lydia Guiles, P.A.    ______________________________  Leslye Peer, MD   WDJ/MEDQ  D:  03/02/2005  T:  03/03/2005  Job:  [id number]   cc:   Leslye Peer, MD  Fax: [place]. Willey Blade, MD  Fax: [phone]

## 2010-06-30 NOTE — Op Note (Signed)
[facility]  Patient:    Margaret Huerta, Margaret Huerta Visit Number: [id number] MRN: [id number]          Service Type: SUR Location: 3W [id number] Attending Physician:  Deeann Cree Proc. Date: 11/20/00 Admit Date:  11/20/2000   CC:         [name], M.D., [address], [place], [place] [address]   Operative Report  PREOPERATIVE DIAGNOSIS:  Renal cell carcinoma.  POSTOPERATIVE DIAGNOSIS:  Renal cell carcinoma.  PROCEDURE:  Right radical nephrectomy.  SURGEON:  Lucina Mellow. Terance Hart, M.D.  ASSISTANT:  Marshall Cork. Jeffie Pollock, M.D.  ANESTHESIA:  General.  INDICATIONS:  This 65 year old lady has had multiple medical problems and during the course of a work-up for abdominal pain, was found to have a 3.5 cm renal mass on ultrasound and CT consistent with a renal cell carcinoma of the mid section of the kidney which would not be amenable to partial resection. After medical clearance, she is brought to the OR today for a radical nephrectomy.  DESCRIPTION OF PROCEDURE:  The patient was placed in the supine position with the right side propped up 10 degrees and her abdominal cavity flexed open by table adjustment.  The abdomen was prepped and draped in the usual fashion. Right subcostal incision was made and entered the peritoneal cavity.  She had lots of adhesions from previous surgery.  She had a somewhat fatty-looking liver.  The peritoneum was incised laterally and around the top of the kidney to free it from the liver medially.  Dissection was carried from inferior to superior to open up the plane between the kidney and the duodenum, and a Kocher maneuver was performed without difficulty.  The upper ureter was divided and ligated with metal hemoclips.  The vascular pedicles dissected out using metal hemoclips and coagulation for hemostasis.  The renal vein was identified, and a branch of the renal artery was right behind it.  I was able to get a right angle and a black  silk around the renal vein to retract it, and then the two major branches of the right renal artery were each identified, isolated, and ligated proximally with two #1 silks and a metal hemoclip.  Then the vein was divided and ligated in a similar fashion.  The kidney was then freed up from some posterior attachments and sent to pathology.  There was excellent hemostasis.  In replacing the intestines in their proper position, a 1 cm tear was noted in the renal capsule where there were adhesions.  It was coagulated and obtained complete hemostasis.  Dr. Marlou Starks stepped in from general surgery and concurred with the fact that the coagulation was all that was necessary and seemed to control the problem.  I put some Surgicel on it, but it would not even adhere because there was such good hemostasis.  The renal fossa was hemostatic, and at this point, the wound was closed with two layers of running #1 PDS; the subcu was irrigated and the skin closed with running 3-0 nylon.  The wound was cleaned and dressed with dry sterile gauze dressings.  She was taken to the recovery room in good condition with a Foley catheter in place which had been put in at the beginning of the procedure. She tolerated the procedure well.  Blood loss was 200-250 cc.  Sponge, needle, and instrument counts were correct. Attending Physician:  Deeann Cree DD:  11/20/00 TD:  11/20/00 Job: [id number]

## 2010-06-30 NOTE — Cardiovascular Report (Signed)
NAME:  Margaret Huerta, Margaret Huerta                           ACCOUNT NO.:  [id number]   MEDICAL RECORD NO.:  [id number]                   PATIENT TYPE:  INP   LOCATION:  3706                                 FACILITY:  [facility]   PHYSICIAN:  Eden Lathe. Einar Gip, M.D.                  DATE OF BIRTH:  1945/09/02   DATE OF PROCEDURE:  01/20/2002  DATE OF DISCHARGE:                              CARDIAC CATHETERIZATION   PROCEDURES PERFORMED:  Right and left heart catheterizations including:  1. Selective coronary arteriography.  2. Abdominal aortogram with selective left renal arteriography.  3. Right heart catheterization including hemodynamic monitoring of right     heart pressures and measurement of cardiac output and cardiac index by     Fick method.   INDICATION:  The patient is a 65 year old female with history of  hypertension, diabetes, hypercholesterolemia, who had an abnormal Cardiolite  stress test.  She also has chronic renal insufficiency.  She has significant  symptoms of obstructive sleep apnea.  Given her obesity and multiple cardiac  risk factors, right and left heart catheterizations are being performed to  evaluate pulmonary hypertension and also to evaluate for coronary artery  disease.   LEFT HEART CATHETERIZATION:  Hemodynamic data:  The left ventricular  pressure was [id number] with end-diastolic pressure of 20 mmHg.  The aortic  pressure was 150/83 with a mean of 111 mmHg.  There was no pressure gradient  across the aortic valve.   ANGIOGRAPHIC DATA:  Right coronary artery:  The right coronary artery is a  dominant vessel.  It is normal.  It gives origin to a small PDA.   Left main coronary artery:  The left main coronary artery is a large-caliber  vessel.  It is normal.   Circumflex coronary artery:  Circumflex coronary artery is a large-caliber  vessel.  It gives origin to a large obtuse marginal 1 and continues in the A-  V groove.  It is normal.   LAD:  The LAD is a  large-caliber vessel.  It gives origin to a moderate-  sized diagonal 1 and diagonal 2.  It is normal.  It wraps around the apex.  It is smooth and large in caliber.   RIGHT HEART CATHETERIZATION:  Hemodynamic data:  The right atrial pressure  was 18/16 with a mean of 15 mmHg.  Right ventricular pressure was 50/13,  with an end-diastolic pressure of 16 mmHg.  The pulmonary arterial pressure  was 37/22 with a mean of 29 mmHg.  The pulmonary capillary wedge was 23/23  with a mean of 23 mmHg.  The cardiac output by Fick was 4.7 with a cardiac  index of 2.4.   IMPRESSION:  1. Normal coronary arteries.  2. Mildly elevated left ventricular end-diastolic pressure suggestive of     diastolic dysfunction.  3. Moderate pulmonary hypertension with normal cardiac output and cardiac  index.   RECOMMENDATION:  Based on the present data, continued medical therapy is  advised.  The patient will follow up with  Dr. Leslye Peer.   TECHNIQUE OF PROCEDURE:  Left heart catheterization:  Under the use of  sterile precautions, 6-French right femoral arterial and 8-French right  femoral venous accesses were obtained.  Initially, a 6-French multipurpose  _____ was advanced into the ascending aorta over a 0.035-inch J wire.  The  catheter was gently advanced in the left ventricle and left ventricular  pressures were monitored.  Then the catheter was pulled back into the aortic  arch and pressure gradient across the aortic valve was monitored.  Right  coronary artery was selectively cannulated and angiography was performed.  In a similar fashion, the left main coronary artery was selectively engaged  and angiography was performed.  Then the catheter was pulled back into the  abdominal aorta and selective left renal arteriography was performed and  then an abdominal aortogram was also performed, then the catheter was pulled  out of the body.  A 6-French LIMA catheter was advanced through the  abdominal  aorta and the right renal artery was attempted to cannulate,  however, it was realized that she had a right nephrectomy, hence, the  procedure was abandoned.   A total of 55 cc of contrast were utilized.  One cc of contrast was utilized  in the left renal artery.  The patient tolerated the procedure well.   Right heart catheterization:  A Swan-Ganz catheter was advanced through the  venous sheath and the pulmonary capillary wedge was easily located.  Right  heart pressures were monitoring, including pulmonary capillary wedge,  pulmonary arterial RV pressure, RA pressures and then the catheter was  pulled out of the body.  Peak pulmonary arterial saturation was also  obtained.  The cardiac output was calculated by Fick.  The patient was then  transferred to recovery in stable condition.                                               Eden Lathe. Einar Gip, M.D.    Minna Antis  D:  01/20/2002  T:  01/20/2002  Job:  [id number]   cc:   Paula Compton. Willey Blade, M.D.  [address]  [place] [address]  Fax: [phone]   Leslye Peer, M.D.  [phone] [address], [address]  [place] [address]  Fax: [phone]

## 2010-06-30 NOTE — Discharge Summary (Signed)
NAMEARETTA, Margaret Huerta                           ACCOUNT NO.:  [id number]   MEDICAL RECORD NO.:  [id number]                   PATIENT TYPE:  INP   LOCATION:  3706                                 FACILITY:  [facility]   PHYSICIAN:  Odette Fraction, M.D.                 DATE OF BIRTH:  02-24-1945   DATE OF ADMISSION:  01/19/2002  DATE OF DISCHARGE:  01/21/2002                                 DISCHARGE SUMMARY   DISCHARGE DIAGNOSES:  1. Chest pain, nonspecific.  2. Patent coronary arteries.  3. Type 2 diabetes mellitus.  4. Hyperlipidemia.  5. Gastroesophageal reflux disease.  6. History of right nephrectomy for right renal carcinoma with a stable     creatinine at 1.8.   CONDITION ON DISCHARGE:  Improved.   PROCEDURES:  On 01/20/02, left and right heart catheterization by Dr. Adrian Prows.   DISCHARGE MEDICATIONS:  1. Lipitor 20 mg daily.  2. Magnesium oxide 400 mg t.i.d.  3. Atenolol 50 mg daily.  4. Niaspan or Niacin 1200 mg at bedtime.  5. Enteric coated aspirin 81 mg daily.  6. Lantus 48 units q.h.s.  7. Glucotrol XL 5 mg one b.i.d.  8. Tylenol for pain.  9. Lotensin 20 mg one daily on 01/22/02.  10.      Lasix 20 mg one daily begin 01/23/02.  11.      Prevacid 30 mg b.i.d.   DISCHARGE INSTRUCTIONS:  1. Use Tylenol as needed for pain.  2. No strenuous activity for two days, then resume regular activities.  3. Low fat, low salt, diabetic diet.  4. Wash the catheterization site with soap and water, call if any swelling,     bleeding, or drainage.  5. Have blood work done on Thursday to check kidney function.  6. Follow up with Dr. Mathis Bud in [place] on 02/04/02 at 12:40.   HISTORY OF PRESENT ILLNESS:  The patient is a 65 year old female who had  seen Dr. Mathis Bud in the office for positive Cardiolite study and chest  pain.  It was felt due to multiple cardiac risk factors, including diabetes,  hypertension, hyperlipidemia, and strong family history of coronary artery  disease, with the father dying of myocardial infarction at 33, and sister  who had two myocardial infarctions first occurring at age 85, would be  prudent to do cardiac catheterization.   HOSPITAL COURSE:  The patient was brought into [facility] on the day prior to the  procedure so that she could undergo IV fluids and Mucomyst to protect her  single kidney with a creatinine of 1.8.   PHYSICAL EXAMINATION:  VITAL SIGNS:  Vital signs at discharge showed a blood  pressure of 124/64, pulse 74, respirations 18, temperature 98, oxygen  saturation on room air was 92%.  HEART:  S1 and S2.  Regular rate and rhythm.  LUNGS:  Clear.  ABDOMEN:  Positive bowel sounds.  Right groin catheter site with some  ecchymoses, but no hematoma, no bruit, 2+ pedal pulses bilaterally.   LABORATORY DATA:  Hemoglobin 12.4, hematocrit 37, white blood cell count  7.3, MCV 80.6, platelets 301.  Prothrombin time 13.6, INR 1, PTT 29.  Chemistries:  Sodium 138, potassium 3.8, chloride 104, CO2 26, glucose 241,  BUN 16, creatinine 1.4, calcium 8.3.  Labs remained stable, and at discharge  creatinine was 1.5, and BUN of 12.   Cardiac enzymes were negative x3, at 60,59, and 54, and MB 1.1, 1, and 1.  Troponin-I was negative at less then 0.01 x3.   Chest x-ray showed mild cardiomegaly with pulmonary venous hypertension, no  edema.  EKG showed normal sinus rhythm, within normal limits.  No  significant change from previous tracing.   Cardiac catheterization on 01/20/02, normal coronary arteries, mildly  elevated left ventricular end diastolic pressure, and suggestion of systolic  dysfunction and moderate pulmonary hypertension with normal cardiac output  and cardiac index.   HOSPITAL COURSE:  The patient was admitted on 01/19/02, for IV fluids and  Mucomyst prior to cardiac catheterization for a positive Cardiolite study.   On 01/20/02, the patient underwent cardiac catheterization, found to have  patent coronary arteries,  and did well.   She did develop some pain after the procedure.  Nitroglycerin did not help.  She was given Tylenol.  By the next morning, she was stable without any  complaints.  It was recommended to increase her Prevacid, perhaps chest pain  was related to reflux disease.  She was instructed not to use non-  steroidals.   She was ready for discharge on the afternoon of 01/21/02, we will follow her  creatinine as an outpatient.  Restarted her Lantus and Glucotrol.  She was  able to ambulate without problems.  Her groin remained stable.  She was seen  by Dr. Claiborne Billings and discharged home.     Margaret Huerta, N.P.                     Odette Fraction, M.D.    LRI/MEDQ  D:  03/15/2002  T:  03/15/2002  Job:  [id number]   cc:   Paula Compton. Willey Blade, M.D.  [address]  [place] [address]  Fax: [phone]   L. Deterding, M.D.  [place], [place]  Nephrology Department

## 2010-06-30 NOTE — Op Note (Signed)
NAMEAJALA, FARAONE                 ACCOUNT NO.:  [id number]   MEDICAL RECORD NO.:  [id number]          PATIENT TYPE:  AMB   LOCATION:  [facility]                         FACILITY:  [facility]   PHYSICIAN:  Susa Day, M.D.    DATE OF BIRTH:  November 29, 1945   DATE OF PROCEDURE:  02/02/2004  DATE OF DISCHARGE:                                 OPERATIVE REPORT   PREOPERATIVE DIAGNOSIS:  Degenerative joint disease, left knee.   POSTOPERATIVE DIAGNOSES:  1.  Grade 3 chondromalacia of the patella.  2.  Medial meniscus tear, loose body.   PROCEDURE PERFORMED:  1.  Left knee arthroscopy.  2.  Chondroplasty of the patella.  3.  Shaving and partial anterior horn medial meniscectomy.  4.  Evacuation of loose body.   ANESTHESIA:  General.   SURGEON:  Johnn Hai, M.D.   ASSISTANT:  None.   BRIEF HISTORY AND INDICATIONS:  A 65 year old with left knee pain, locking,  and giving way.  Operative intervention was indicated for diagnosis and  treatment of degenerative changes seen on x-ray.  Risks and benefits  discussed including bleeding, infection, damage to vascular structures, no  change in symptoms, worsening symptoms, etc.   TECHNIQUE:  The patient in supine position.  After induction of adequate  general anesthesia and 1 g of Kefzol, the left lower extremity was prepped  and draped in the usual sterile fashion.  A lateral parapatellar portal and  superior medial parapatellar portal was fashioned with a #11 blade, Ingress  cannula atraumatically placed.  Irrigant was utilized to insufflate the  joint.  Under direct visualization, medial parapatellar portal was fashioned  with a #11 blade after localization with an 18 gauge needle sparing the  medial meniscus.  Inspection of the suprapatellar pouch revealed extensive  grade 3 changes of the patella, and fibrillation was noted, normal  patellofemoral tracking.  The sulcus was unremarkable.  [place] Carbo was  introduced and utilized to perform a  chondroplasty to a stable base of the  patella.  Medial compartment revealed an anterior horn medial meniscus tear.  [place] Carbo was introduced and utilized to resect this to a stable base.  The  meniscus was probed.  It was without evidence of residual tear.  There were  some grade 3 changes of the tibial plateau, and the femoral condyle was  relatively unremarkable.  There was loose cartilaginous body that was  evacuated.  ACL and PCL were unremarkable.  The lateral compartment was  difficult to visualize, but we were able to visualize it.  Some minor grade  2 changes.  Meniscus was with evidence of tear and stable to probe  palpation.  The chondral surfaces were unremarkable.  They were probed.  There were no loose osteochondral defect noted.  Gutters were unremarkable.  The knee was copiously lavaged.  All instrumentation was removed.  Portals  were closed with 4-0 nylon simple sutures.  Marcaine 0.25% with epinephrine  was infiltrated in  the joint.  The wound was dressed sterilely.  She was then woken without  difficulty and transported to the recovery room in  satisfactory condition.   The patient tolerated the procedure well with no complications.     Merry Proud   JB/MEDQ  D:  02/02/2004  T:  02/02/2004  Job:  [id number]

## 2010-06-30 NOTE — Discharge Summary (Signed)
[facility]  Patient:    Margaret Huerta, Margaret Huerta Visit Number: [id number] MRN: [id number]          Service Type: SUR Location: 3W [id number] Attending Physician:  Deeann Cree Dictated by:   Lucina Mellow. Terance Hart, M.D. Admit Date:  11/20/2000 Discharge Date: 11/25/2000   CC:         Court Joy, M.D.                           Discharge Summary  PROCEDURE:  Right radical nephrectomy on 11/20/00.  FINAL DIAGNOSES: 1. Right renal cell carcinoma. 2. Diabetes mellitus. 3. Postoperative anemia. 4. Postoperative ATN. 5. Pneumonia. 6. Hypertension. 7. Dyspepsia. 8. Irritable bowel syndrome. 9. History of migraine headaches.  HISTORY OF PRESENT ILLNESS:  This 65 year old white female was having some fleeting abdominal pain, and an ultrasound done by her internist in [place] revealed a 3.5 cm complex mass in the mid section of the right kidney, and CT scan with and without contrast on October 15, 2000, showed a 3.5 cm solid mass consistent with a renal cell carcinoma located in the mid section of the right kidney with no evidence of metastatic disease.  This lesion was not amenable to partial nephrectomy, and therefore she was scheduled for radical nephrectomy.  She was advised of operative complications.  PAST MEDICAL HISTORY:  She has a history of multiple medical problems with hypertension, diabetes, and dyspepsia.  PAST SURGICAL HISTORY:  She has had multiple operative procedures in the past.  PHYSICAL EXAMINATION:  GENERAL:  A heavy set white female who is alert and oriented.  SKIN:  Warm and dry.  CHEST:  Clear.  ABDOMEN:  Obese, but soft and nontender.  HOSPITAL COURSE:  After admission, she underwent a right radical nephrectomy. [facility] were consulted to help manage her diabetes postoperatively.  She was intolerant of postoperative pain, but her Toradol had to be stopped because of rising creatinine of 2.9,  with a preoperative level of 1.1.  She also had a postoperative anemia of 10.5 g.  Her blood pressure was on the low side, so her blood pressure medications were held, and she was put on some dopamine.  She had her blood pressure medications held. She received some fluid boluses.  Gradually her incisional pain tapered off. She had a chest x-ray showing some left lower lobe infiltrate, and she was treated with pulmonary toilet and antibiotic coverage, included Tequin.  By the day of discharge she was tolerating a diet and ambulating satisfactorily, and she was afebrile.  DISCHARGE MEDICATIONS: 1. Darvocet-N 100 for pain. 2. Tequin 200 mg q.d. 3. Amaryl 2 mg q.d. 4. Humibid LA two tablets b.i.d. 5. Metered dose inhaler Combivent.  She was told to hold her atenolol and Lasix, and stop her Glucotrol and Glucophage.  ACTIVITY:  Limited.  DIET:  Diabetic.  FOLLOWUP:  She will return to the office in one week.  CONDITION ON DISCHARGE:  She was sent home in improved condition.  Her creatinine at the time of discharge had come back down to 1.8.  Her final pathology showed renal cell carcinoma that was felt to be confined t the kidney. Dictated by:   Lucina Mellow. Terance Hart, M.D. Attending Physician:  Deeann Cree DD:  12/09/00 TD:  12/10/00 Job: [id number]

## 2010-09-07 ENCOUNTER — Ambulatory Visit: Payer: Self-pay | Admitting: Pulmonary Disease

## 2010-09-08 ENCOUNTER — Encounter: Payer: Self-pay | Admitting: Pulmonary Disease

## 2010-09-13 ENCOUNTER — Ambulatory Visit: Payer: Self-pay | Admitting: Pulmonary Disease

## 2011-01-01 ENCOUNTER — Other Ambulatory Visit (HOSPITAL_COMMUNITY): Payer: Self-pay | Admitting: Internal Medicine

## 2011-01-01 DIAGNOSIS — Z139 Encounter for screening, unspecified: Secondary | ICD-10-CM

## 2011-01-02 ENCOUNTER — Ambulatory Visit (HOSPITAL_COMMUNITY)
Admission: RE | Admit: 2011-01-02 | Discharge: 2011-01-02 | Disposition: A | Discharge status: Home / Self Care | Payer: Medicare Other | Source: Ambulatory Visit | Attending: Internal Medicine | Admitting: Internal Medicine

## 2011-01-02 DIAGNOSIS — Z139 Encounter for screening, unspecified: Secondary | ICD-10-CM

## 2011-01-02 DIAGNOSIS — Z1231 Encounter for screening mammogram for malignant neoplasm of breast: Secondary | ICD-10-CM | POA: Insufficient documentation

## 2011-11-20 ENCOUNTER — Other Ambulatory Visit (HOSPITAL_COMMUNITY): Payer: Self-pay | Admitting: Internal Medicine

## 2011-11-20 DIAGNOSIS — Z139 Encounter for screening, unspecified: Secondary | ICD-10-CM

## 2012-01-07 ENCOUNTER — Ambulatory Visit (HOSPITAL_COMMUNITY)
Admission: RE | Admit: 2012-01-07 | Discharge: 2012-01-07 | Disposition: A | Discharge status: Home / Self Care | Payer: Medicare Other | Source: Ambulatory Visit | Attending: Internal Medicine | Admitting: Internal Medicine

## 2012-01-07 DIAGNOSIS — Z1231 Encounter for screening mammogram for malignant neoplasm of breast: Secondary | ICD-10-CM | POA: Insufficient documentation

## 2012-01-07 DIAGNOSIS — Z139 Encounter for screening, unspecified: Secondary | ICD-10-CM

## 2012-06-02 ENCOUNTER — Encounter (INDEPENDENT_AMBULATORY_CARE_PROVIDER_SITE_OTHER): Payer: Self-pay | Admitting: General Surgery

## 2012-06-02 ENCOUNTER — Encounter (INDEPENDENT_AMBULATORY_CARE_PROVIDER_SITE_OTHER): Payer: Self-pay

## 2012-06-10 ENCOUNTER — Encounter (INDEPENDENT_AMBULATORY_CARE_PROVIDER_SITE_OTHER): Payer: Self-pay | Admitting: General Surgery

## 2012-06-10 ENCOUNTER — Ambulatory Visit (INDEPENDENT_AMBULATORY_CARE_PROVIDER_SITE_OTHER): Payer: Medicare Other | Admitting: General Surgery

## 2012-06-10 ENCOUNTER — Telehealth (INDEPENDENT_AMBULATORY_CARE_PROVIDER_SITE_OTHER): Payer: Self-pay | Admitting: General Surgery

## 2012-06-10 ENCOUNTER — Other Ambulatory Visit (INDEPENDENT_AMBULATORY_CARE_PROVIDER_SITE_OTHER): Payer: Self-pay | Admitting: General Surgery

## 2012-06-10 VITALS — BP 132/88 | HR 64 | Temp 97.4°F | Resp 20 | Ht 63.0 in | Wt 226.6 lb

## 2012-06-10 DIAGNOSIS — K432 Incisional hernia without obstruction or gangrene: Secondary | ICD-10-CM

## 2012-06-10 NOTE — Telephone Encounter (Signed)
Spoke with patient she is aware of ct scan on 06/12/12 at 3pm she was instructed on contrast and no eating solids 4 hrs prior  To test  .

## 2012-06-10 NOTE — Progress Notes (Signed)
Patient ID: Margaret Huerta, female   DOB: 04/16/1945, 67 y.o.   MRN: [id number]  Chief Complaint  Patient presents with  . Incisional Hernia    new pt- eval incisional hernia    HPI Margaret Huerta is a 67 y.o. female.  Referred by Dr [name] HPI This is a 67 year old female with multiple medical problems including diabetes, high blood pressure, status post gastric bypass for morbid obesity, and multiple abdominal surgeries who presents with an incisional hernia. I have her operative report from her diagnostic laparoscopy, extensive lysis of adhesions, conversion to open laparotomy, Roux-en-Y gastric bypass and closure of abdominal wall with a wound VAC. This was completed on November 28 of 2012 at [facility]. This is a four-page operative report describes this case is a very difficult. She had both abdominal wall adhesions from her prior gynecologic surgery as well as significant lesions from her fundoplication then ended of making this a very difficult bypass. Postoperatively she spent 4 days in intensive care including some on the ventilator and 8 more days in the hospital following that. The procedure was about 8 hours in length she said today as well. Afterwards she began noticing some drainage as well as a bulge at this site. Since then this is gotten a little bit larger over the last year. She has some pain with movement. This goes down when she lies down. She is having bowel movements he really is at her baseline for upper GI function. She did sound like she may have had a wound infection postoperatively. She also has a history of renal cell cancer for which she's had a radical nephrectomy on the right for she has no evidence of disease now. She asked her bypass surgeon about the hernia not it sounds like he told her that he would just follow this for right now. She comes in today for an opinion.  Past Medical History  Diagnosis Date  . Diabetes mellitus   . Hyperlipidemia   .  Hypertension   . Angina pectoris   . Cervical cancer   . Renal cancer   . Thyroid disease     parathyroid  . Neuropathy   . Osteoporosis   . Fibromyalgia   . Depression     Past Surgical History  Procedure Laterality Date  . Nephrectomy      right  . Cholecystectomy    . Back surgery    . Partial hysterectomy    . Appendectomy    . Tmj arthroplasty    . Dilation and curettage of uterus    . Tonsillectomy    . Abdominal hysterectomy    . Incontinence surgery    . Kidney Glander surgery    . Mouth surgery    . Upper gi endoscopy    . Cardiac catheterization    . Knee arthroscopy    . Gastric bypass  2012    Family History  Problem Relation Age of Onset  . Emphysema Father   . Heart disease Father   . Allergies      whole family per pt  . Clotting disorder Mother   . Diabetes Mother   . Kidney disease Mother   . Lung cancer Maternal Uncle     Social History History  Substance Use Topics  . Smoking status: Never Smoker   . Smokeless tobacco: Not on file  . Alcohol Use: No    Allergies  Allergen Reactions  . Codeine  REACTION: hallucination, confused  . Demerol (Meperidine)   . Dilaudid (Hydromorphone Hcl)   . Hydrocodone-Acetaminophen     REACTION: stop breathing  . Hydromorphone Hcl     REACTION: itching, rash, nausea, vomiting  . Meperidine Hcl     REACTION: stop breathing  . Morphine     REACTION: vomiting  . Naproxen     REACTION: stomach lesion  . Vicodin (Hydrocodone-Acetaminophen)     Current Outpatient Prescriptions  Medication Sig Dispense Refill  . allopurinol (ZYLOPRIM) 300 MG tablet Take 300 mg by mouth daily.        Marland Kitchen atenolol (TENORMIN) 100 MG tablet Take 100 mg by mouth 2 (two) times daily.        . cholecalciferol (VITAMIN D) 1000 UNITS tablet Take 1,000 Units by mouth daily.      . cyclobenzaprine (FLEXERIL) 10 MG tablet Take 10 mg by mouth 3 (three) times daily as needed.        . DULoxetine (CYMBALTA) 60 MG capsule Take 60  mg by mouth daily.        . fenofibrate micronized (LOFIBRA) 134 MG capsule Take 134 mg by mouth daily before breakfast.      . furosemide (LASIX) 80 MG tablet Take 80 mg by mouth 2 (two) times daily.        . insulin aspart (NOVOLOG) 100 UNIT/ML injection Inject 5 Units into the skin 3 (three) times daily before meals.        . insulin glargine (LANTUS) 100 UNIT/ML injection 70 units before breakfast and 80 before dinner       . isosorbide mononitrate (IMDUR) 30 MG 24 hr tablet Take 30 mg by mouth daily.        . Multiple Vitamin (MULTIVITAMIN) capsule Take 1 capsule by mouth daily.        Marland Kitchen omeprazole (PRILOSEC) 20 MG capsule Take 20 mg by mouth daily.      . rosuvastatin (CRESTOR) 20 MG tablet Take 20 mg by mouth daily.        Marland Kitchen telmisartan (MICARDIS) 80 MG tablet Take 80 mg by mouth daily.        . verapamil (COVERA HS) 240 MG (CO) 24 hr tablet Take 240 mg by mouth daily.        . vitamin B-12 (CYANOCOBALAMIN) 1000 MCG tablet Take 1,000 mcg by mouth daily.         No current facility-administered medications for this visit.    Review of Systems Review of Systems  Constitutional: Positive for fatigue. Negative for fever, chills and unexpected weight change.  HENT: Negative for hearing loss, congestion, sore throat, trouble swallowing and voice change.   Eyes: Negative for visual disturbance.  Respiratory: Positive for cough. Negative for wheezing.   Cardiovascular: Negative for chest pain, palpitations and leg swelling.  Gastrointestinal: Positive for nausea and abdominal pain. Negative for vomiting, diarrhea, constipation, blood in stool, abdominal distention and anal bleeding.  Genitourinary: Negative for hematuria, vaginal bleeding and difficulty urinating.  Musculoskeletal: Negative for arthralgias.  Skin: Negative for rash and wound.  Neurological: Positive for weakness. Negative for seizures, syncope and headaches.  Hematological: Negative for adenopathy. Bruises/bleeds easily.    Psychiatric/Behavioral: Negative for confusion.    Blood pressure 132/88, pulse 64, temperature 97.4 F (36.3 C), temperature source Temporal, resp. rate 20, height 5\' 3"  (1.6 m), weight 226 lb 9.6 oz (102.785 kg).  Physical Exam Physical Exam  Vitals reviewed. Constitutional: She appears well-developed and well-nourished.  Eyes: No  scleral icterus.  Neck: Neck supple.  Cardiovascular: Normal rate, regular rhythm and normal heart sounds.   Pulmonary/Chest: Effort normal and breath sounds normal. She has no wheezes. She has no rales.  Abdominal: Soft. Bowel sounds are normal. She exhibits no ascites. There is no tenderness. A hernia is present. Hernia confirmed positive in the ventral area.    Lymphadenopathy:    She has no cervical adenopathy.    Data Reviewed All of her old records and note from Dr Willey Blade, Dr Portenier  Assessment    Incisional hernia     Plan    She has a very mildly symptomatic incisional hernia. We discussed the repair of this incisional hernia either with the laparoscope or in an open fashion. I have reviewed all of her records including particularly her operative note from her gastric bypass will be which will be scanned and the computer today. Apparently she also discussed his hernia with the surgeon who repaired( Dr Flavia Shipper) her gastric bypass and was told that he would be best to just monitor this. The operative report describes a difficult laparoscopic converted to open gastrostomy. Much of this appears due to her prior fundoplication but this certainly looks like she has a fair amount of adhesions throughout. She has lost about 60 pounds since then and has remained stable. Her surgery on her is not without its risk. She is interested in understanding what the risk is. I'm going to discuss this with her cardiologist as well as obtain a CT scan prior to proceeding any further. I may tell her that monitoring this is the best plan given her comorbidities as  well as her abdominal surgical history. She will see me after her CT scan.       Wenceslao Loper 06/10/2012, 2:59 PM

## 2012-06-12 ENCOUNTER — Ambulatory Visit
Admission: RE | Admit: 2012-06-12 | Discharge: 2012-06-12 | Disposition: A | Discharge status: Home / Self Care | Payer: Medicare Other | Source: Ambulatory Visit | Attending: General Surgery | Admitting: General Surgery

## 2012-06-12 DIAGNOSIS — K432 Incisional hernia without obstruction or gangrene: Secondary | ICD-10-CM

## 2012-06-20 ENCOUNTER — Telehealth (INDEPENDENT_AMBULATORY_CARE_PROVIDER_SITE_OTHER): Payer: Self-pay

## 2012-06-20 ENCOUNTER — Encounter (INDEPENDENT_AMBULATORY_CARE_PROVIDER_SITE_OTHER): Payer: Self-pay

## 2012-06-20 NOTE — Telephone Encounter (Signed)
Pt notified of our office receiving the cardiac clearance from Dr Kennon Holter office. The pt has a f/u appt with Dr Donne Hazel on 07/11/12 to discuss having surgery. I advised pt that her clearance will be scanned into our system. The pt understands.

## 2012-07-11 ENCOUNTER — Encounter (INDEPENDENT_AMBULATORY_CARE_PROVIDER_SITE_OTHER): Payer: Medicare Other | Admitting: General Surgery

## 2012-07-11 ENCOUNTER — Encounter (INDEPENDENT_AMBULATORY_CARE_PROVIDER_SITE_OTHER): Payer: Self-pay | Admitting: General Surgery

## 2012-07-11 ENCOUNTER — Ambulatory Visit (INDEPENDENT_AMBULATORY_CARE_PROVIDER_SITE_OTHER): Payer: Medicare Other | Admitting: General Surgery

## 2012-07-11 VITALS — BP 130/76 | HR 68 | Resp 18 | Ht 63.0 in | Wt 227.0 lb

## 2012-07-11 DIAGNOSIS — K432 Incisional hernia without obstruction or gangrene: Secondary | ICD-10-CM

## 2012-07-11 NOTE — Progress Notes (Signed)
Subjective:     Patient ID: Margaret Huerta, female   DOB: 03/19/1945, 67 y.o.   MRN: [id number]  HPI This is a 67 year old female whose history is documented well my prior note. She has a complicated surgical history and has multiple medical problems as well. I sent her for a CT scan which she comes back today to review. She has a pretty significant incisional hernia as expected. She also has a lot of laxity in the region of her kidney operation. She has been sick with what sounds like bronchitis for the last several weeks but is otherwise the same today.  Review of Systems    CT ABDOMEN AND PELVIS WITHOUT CONTRAST  Technique: Multidetector CT imaging of the abdomen and pelvis was  performed following the standard protocol without intravenous  contrast.  Comparison: 05/11/2003  Findings: The study was performed during Valsalva.  There is a ventral wall hernia just below the xyphoid. This  contains intraperitoneal fat and proximal small bowel. More  inferiorly, there is a hernia which contains transverse colon and a  knuckle of small bowel. No evidence of bowel obstruction. More  inferiorly, small supraumbilical hernia containing a knuckle of  small bowel. No evidence of bowel obstruction.  Postoperative changes from prior gastric bypass, right nephrectomy  and hysterectomy. Prior cholecystectomy. Liver, spleen, pancreas,  and adrenals have an unremarkable unenhanced appearance. Small  nonobstructing Briseno in the lower pole of the left kidney. No  hydronephrosis. Mild left perinephric stranding, likely related to  chronic/prior insult.  Urinary bladder is unremarkable. No adnexal masses. No free  fluid, free air or adenopathy. Aorta is normal caliber.  No acute bony abnormality.  IMPRESSION:  Multiple multiple ventral hernias as above.  Objective:   Physical Exam Deferred today     Assessment:     Incisional hernia     Plan:     I showed her and her husband her CT scan today.  Her husband was present for the visit today and states that she really has not even returned to her baseline since her gastric bypass. He is concerned about her health as I am as well. After reviewing her CT scan, prior operative report, and discussing her case with she and her husband I think it is best not to proceed to the operating room. We discussed there is a small risk of incarceration and an emergency procedure but I think that the risk of an operation for her for outweighs that. I offered her to go to [place] to see another hernia surgeon down near for another opinion. They're going to consider this for now. I think it even if this can be fixed he may be a better decision not to fix it in her given her comorbidities and other issues. I recommended her wear an abdominal binder as well. I will see her back as needed.

## 2012-07-13 ENCOUNTER — Other Ambulatory Visit: Payer: Self-pay | Admitting: Cardiovascular Disease

## 2012-07-15 ENCOUNTER — Telehealth: Payer: Self-pay | Admitting: Cardiovascular Disease

## 2012-07-15 NOTE — Telephone Encounter (Signed)
Margaret Huerta is needing a prescription refill on her Imdur 60mg  .. The pharmacy says they sent the request on Friday .[facility] in [place] .[facility] [phone]..    Thanks

## 2012-07-16 ENCOUNTER — Other Ambulatory Visit: Payer: Self-pay | Admitting: Cardiovascular Disease

## 2012-07-16 NOTE — Telephone Encounter (Signed)
Confirmation from pharmacy on 6.3.14.

## 2012-07-16 NOTE — Telephone Encounter (Signed)
Already done

## 2012-08-13 ENCOUNTER — Other Ambulatory Visit: Payer: Self-pay

## 2012-08-13 MED ORDER — ISOSORBIDE MONONITRATE ER 60 MG PO TB24: 60.0000 mg | ORAL_TABLET | Freq: Every day | ORAL | Status: DC

## 2012-08-13 NOTE — Telephone Encounter (Signed)
Rx was sent to pharmacy electronically. 

## 2012-08-18 ENCOUNTER — Other Ambulatory Visit: Payer: Self-pay | Admitting: *Deleted

## 2012-08-20 ENCOUNTER — Ambulatory Visit (INDEPENDENT_AMBULATORY_CARE_PROVIDER_SITE_OTHER): Payer: Medicare Other | Admitting: Cardiovascular Disease

## 2012-08-20 ENCOUNTER — Encounter: Payer: Self-pay | Admitting: Cardiovascular Disease

## 2012-08-20 VITALS — BP 128/72 | HR 66 | Ht 63.0 in | Wt 232.6 lb

## 2012-08-20 DIAGNOSIS — I10 Essential (primary) hypertension: Secondary | ICD-10-CM

## 2012-08-20 DIAGNOSIS — E785 Hyperlipidemia, unspecified: Secondary | ICD-10-CM

## 2012-08-20 NOTE — Assessment & Plan Note (Signed)
On statin drug followed by her PCP

## 2012-08-20 NOTE — Progress Notes (Signed)
08/20/2012 Margaret Huerta   [birth year]  [id number]  Primary Physician [name], MD Primary Cardiologist: Lorretta Harp MD Margaret Huerta   HPI:  The patient is a 67 year old, moderately overweight, married Caucasian female, mother of 2 living children (one died at age 79 from a gunshot wound), grandmother to 2 grandchildren who I last saw 3 months ago. She has a history of hypertension, hyperlipidemia, type 2 diabetes, and obstructive sleep apnea with a strong family history for heart disease with her father dying of an MI at age 34, and a sister, who is also a patient of mine, has had 2 myocardial infractions. She has had fibromyalgia as well as right nephrectomy back in 2003 for renal cell carcinoma. She has had bariatric surgery performed at [facility] in February of last year and a remote Nissen fundoplication back in [id number]. She was catheterized by Dr. Einar Gip in 2003 and was found to have normal coronary arteries and normal LV function. She was complaining of chest pain radiating to her jaw. A Myoview stress test performed in March 2013 was nonischemic. I increased her Imdur from 30 mg to 60 mg a day, which resulted in significant improvement in her chest pain. She has had no recurrent chest pain since I saw her 08/02/11. Dr. Willey Blade phospholipid profile.    Current Outpatient Prescriptions  Medication Sig Dispense Refill  . allopurinol (ZYLOPRIM) 300 MG tablet Take 300 mg by mouth daily.        Margaret Huerta atenolol (TENORMIN) 100 MG tablet Take 100 mg by mouth 2 (two) times daily.        . cholecalciferol (VITAMIN D) 1000 UNITS tablet Take 1,000 Units by mouth daily.      . cyclobenzaprine (FLEXERIL) 10 MG tablet Take 10 mg by mouth 3 (three) times daily as needed.        . DULoxetine (CYMBALTA) 60 MG capsule Take 60 mg by mouth daily.        . furosemide (LASIX) 80 MG tablet Take 40 mg by mouth 2 (two) times daily.       Margaret Huerta HUMALOG KWIKPEN 100 UNIT/ML SOPN Inject into the skin 3 (three) times daily  before meals.      . insulin glargine (LANTUS) 100 UNIT/ML injection Inject 50 Units into the skin 2 (two) times daily.       . isosorbide mononitrate (IMDUR) 60 MG 24 hr tablet Take 1 tablet (60 mg total) by mouth daily.  15 tablet  0  . Multiple Vitamin (MULTIVITAMIN) capsule Take 1 capsule by mouth daily.        . rosuvastatin (CRESTOR) 20 MG tablet Take 20 mg by mouth daily.        Margaret Huerta telmisartan (MICARDIS) 80 MG tablet Take 80 mg by mouth daily.        . verapamil (COVERA HS) 240 MG (CO) 24 hr tablet Take 240 mg by mouth daily.        . vitamin B-12 (CYANOCOBALAMIN) 1000 MCG tablet Take 1,000 mcg by mouth daily.         No current facility-administered medications for this visit.    Allergies  Allergen Reactions  . Demerol (Meperidine) Shortness Of Breath    Stop breathing  . Hydrocodone-Acetaminophen Shortness Of Breath    REACTION: stop breathing  . Meperidine Hcl Shortness Of Breath    REACTION: stop breathing  . Vicodin (Hydrocodone-Acetaminophen) Shortness Of Breath    Stop breathing  . Codeine     REACTION: hallucination, confused  .  Hydromorphone Hcl     REACTION: itching, rash, nausea, vomiting  . Morphine     REACTION: vomiting  . Naproxen     REACTION: stomach lesion  . Dilaudid (Hydromorphone Hcl) Itching, Nausea And Vomiting and Rash    History   Social History  . Marital Status: Married    Spouse Name: N/A    Number of Children: N/A  . Years of Education: N/A   Occupational History  . Housewife    Social History Main Topics  . Smoking status: Never Smoker   . Smokeless tobacco: Never Used  . Alcohol Use: No  . Drug Use: No  . Sexually Active: Not on file   Other Topics Concern  . Not on file   Social History Narrative  . No narrative on file     Review of Systems: General: negative for chills, fever, night sweats or weight changes.  Cardiovascular: negative for chest pain, dyspnea on exertion, edema, orthopnea, palpitations, paroxysmal  nocturnal dyspnea or shortness of breath Dermatological: negative for rash Respiratory: negative for cough or wheezing Urologic: negative for hematuria Abdominal: negative for nausea, vomiting, diarrhea, bright red blood per rectum, melena, or hematemesis Neurologic: negative for visual changes, syncope, or dizziness All other systems reviewed and are otherwise negative except as noted above.    Blood pressure 128/72, pulse 66, height 5\' 3"  (1.6 m), weight 232 lb 9.6 oz (105.507 kg).  General appearance: alert and no distress Neck: no adenopathy, no carotid bruit, no JVD, supple, symmetrical, trachea midline and thyroid not enlarged, symmetric, no tenderness/mass/nodules Lungs: clear to auscultation bilaterally Heart: regular rate and rhythm, S1, S2 normal, no murmur, click, rub or gallop Extremities: extremities normal, atraumatic, no cyanosis or edema  EKG normal sinus rhythm at 66 without ST or T wave changes  ASSESSMENT AND PLAN:   HYPERLIPIDEMIA On statin drug followed by her PCP  HYPERTENSION On appropriate medications with normal blood pressure today      Lorretta Harp MD [facility], [facility] 08/20/2012 10:05 AM

## 2012-08-20 NOTE — Assessment & Plan Note (Signed)
On appropriate medications with normal blood pressure today

## 2012-08-20 NOTE — Patient Instructions (Addendum)
Your physician recommends that you schedule a follow-up appointment in: 1 year  

## 2012-08-28 ENCOUNTER — Other Ambulatory Visit: Payer: Self-pay | Admitting: Cardiovascular Disease

## 2012-08-29 NOTE — Telephone Encounter (Signed)
Rx was sent to pharmacy electronically. 

## 2012-12-02 ENCOUNTER — Other Ambulatory Visit (HOSPITAL_COMMUNITY): Payer: Self-pay | Admitting: Internal Medicine

## 2012-12-02 DIAGNOSIS — Z139 Encounter for screening, unspecified: Secondary | ICD-10-CM

## 2012-12-18 ENCOUNTER — Other Ambulatory Visit: Payer: Self-pay

## 2013-01-13 ENCOUNTER — Ambulatory Visit (HOSPITAL_COMMUNITY)
Admission: RE | Admit: 2013-01-13 | Discharge: 2013-01-13 | Disposition: A | Discharge status: Home / Self Care | Payer: Medicare Other | Source: Ambulatory Visit | Attending: Internal Medicine | Admitting: Internal Medicine

## 2013-01-13 DIAGNOSIS — Z139 Encounter for screening, unspecified: Secondary | ICD-10-CM

## 2013-01-13 DIAGNOSIS — Z1231 Encounter for screening mammogram for malignant neoplasm of breast: Secondary | ICD-10-CM | POA: Insufficient documentation

## 2013-01-19 ENCOUNTER — Telehealth (INDEPENDENT_AMBULATORY_CARE_PROVIDER_SITE_OTHER): Payer: Self-pay

## 2013-01-19 DIAGNOSIS — K432 Incisional hernia without obstruction or gangrene: Secondary | ICD-10-CM

## 2013-01-19 NOTE — Telephone Encounter (Signed)
Message copied by Illene Regulus on Mon Jan 19, 2013 10:28 AM ------      Message from: Aviva Signs      Created: Thu Jan 15, 2013 12:26 PM       Pt would like a referel to a [place] doctor as discussed at last apt in May.Marland KitchenMarland KitchenThanks Thayer Headings  ------

## 2013-01-19 NOTE — Telephone Encounter (Signed)
Called pt to let her know that Dr Donne Hazel did give [place] 2 names of physicians to try and schedule with in [place]. I advised pt that once we have the referral taken care of for the appt we will call her back with the appt info. The pt understands.

## 2013-01-20 ENCOUNTER — Telehealth (INDEPENDENT_AMBULATORY_CARE_PROVIDER_SITE_OTHER): Payer: Self-pay | Admitting: *Deleted

## 2013-01-20 NOTE — Telephone Encounter (Signed)
LMOM for pt to return my call.  I was calling to inform pt of her appt with Dr. Trinidad Curet at [facility] surgical group on 12/23 with an arrival time of 1:15pm.  Their address is [phone] in [place].  Their phone number is [phone].

## 2013-01-20 NOTE — Telephone Encounter (Signed)
Pt returned my call and appt information below was given.  Pt was agreeable with this appt.

## 2013-02-12 HISTORY — PX: ABDOMINAL HERNIA REPAIR: SHX539

## 2013-02-12 HISTORY — PX: CATARACT EXTRACTION W/ INTRAOCULAR LENS  IMPLANT, BILATERAL: SHX1307

## 2013-03-02 DIAGNOSIS — K432 Incisional hernia without obstruction or gangrene: Secondary | ICD-10-CM | POA: Diagnosis not present

## 2013-03-09 DIAGNOSIS — R1084 Generalized abdominal pain: Secondary | ICD-10-CM | POA: Diagnosis not present

## 2013-03-09 DIAGNOSIS — K660 Peritoneal adhesions (postprocedural) (postinfection): Secondary | ICD-10-CM | POA: Diagnosis not present

## 2013-03-09 DIAGNOSIS — L299 Pruritus, unspecified: Secondary | ICD-10-CM | POA: Diagnosis not present

## 2013-03-09 DIAGNOSIS — J189 Pneumonia, unspecified organism: Secondary | ICD-10-CM | POA: Diagnosis not present

## 2013-03-09 DIAGNOSIS — Z6841 Body Mass Index (BMI) 40.0 and over, adult: Secondary | ICD-10-CM | POA: Diagnosis not present

## 2013-03-09 DIAGNOSIS — G8918 Other acute postprocedural pain: Secondary | ICD-10-CM | POA: Diagnosis not present

## 2013-03-09 DIAGNOSIS — K432 Incisional hernia without obstruction or gangrene: Secondary | ICD-10-CM | POA: Diagnosis not present

## 2013-03-09 DIAGNOSIS — R079 Chest pain, unspecified: Secondary | ICD-10-CM | POA: Diagnosis not present

## 2013-03-09 DIAGNOSIS — Z9884 Bariatric surgery status: Secondary | ICD-10-CM | POA: Diagnosis not present

## 2013-03-09 DIAGNOSIS — K436 Other and unspecified ventral hernia with obstruction, without gangrene: Secondary | ICD-10-CM | POA: Diagnosis not present

## 2013-03-24 DIAGNOSIS — K432 Incisional hernia without obstruction or gangrene: Secondary | ICD-10-CM | POA: Diagnosis not present

## 2013-04-03 DIAGNOSIS — Z79899 Other long term (current) drug therapy: Secondary | ICD-10-CM | POA: Diagnosis not present

## 2013-04-03 DIAGNOSIS — E119 Type 2 diabetes mellitus without complications: Secondary | ICD-10-CM | POA: Diagnosis not present

## 2013-04-06 DIAGNOSIS — K272 Acute peptic ulcer, site unspecified, with both hemorrhage and perforation: Secondary | ICD-10-CM | POA: Diagnosis not present

## 2013-04-06 DIAGNOSIS — F3289 Other specified depressive episodes: Secondary | ICD-10-CM | POA: Diagnosis not present

## 2013-04-06 DIAGNOSIS — E1129 Type 2 diabetes mellitus with other diabetic kidney complication: Secondary | ICD-10-CM | POA: Diagnosis not present

## 2013-04-06 DIAGNOSIS — F329 Major depressive disorder, single episode, unspecified: Secondary | ICD-10-CM | POA: Diagnosis not present

## 2013-07-07 DIAGNOSIS — E119 Type 2 diabetes mellitus without complications: Secondary | ICD-10-CM | POA: Diagnosis not present

## 2013-07-07 DIAGNOSIS — M109 Gout, unspecified: Secondary | ICD-10-CM | POA: Diagnosis not present

## 2013-07-07 DIAGNOSIS — E569 Vitamin deficiency, unspecified: Secondary | ICD-10-CM | POA: Diagnosis not present

## 2013-07-07 DIAGNOSIS — Z79899 Other long term (current) drug therapy: Secondary | ICD-10-CM | POA: Diagnosis not present

## 2013-07-09 DIAGNOSIS — I10 Essential (primary) hypertension: Secondary | ICD-10-CM | POA: Diagnosis not present

## 2013-07-09 DIAGNOSIS — E1129 Type 2 diabetes mellitus with other diabetic kidney complication: Secondary | ICD-10-CM | POA: Diagnosis not present

## 2013-07-09 DIAGNOSIS — E785 Hyperlipidemia, unspecified: Secondary | ICD-10-CM | POA: Diagnosis not present

## 2013-09-02 ENCOUNTER — Ambulatory Visit (INDEPENDENT_AMBULATORY_CARE_PROVIDER_SITE_OTHER): Payer: Medicare Other | Admitting: Cardiovascular Disease

## 2013-09-02 ENCOUNTER — Encounter: Payer: Self-pay | Admitting: Cardiovascular Disease

## 2013-09-02 VITALS — BP 120/62 | HR 64 | Ht 62.0 in | Wt 228.4 lb

## 2013-09-02 DIAGNOSIS — E785 Hyperlipidemia, unspecified: Secondary | ICD-10-CM | POA: Diagnosis not present

## 2013-09-02 DIAGNOSIS — I10 Essential (primary) hypertension: Secondary | ICD-10-CM | POA: Diagnosis not present

## 2013-09-02 NOTE — Progress Notes (Signed)
09/02/2013 Margaret Huerta   September 16, 1945  [id number]  Primary Physician [name], MD Primary Cardiologist: Lorretta Harp MD Margaret Huerta   HPI:  The patient is a 68 year old, moderately overweight, married Caucasian female, mother of 2 living children (one died at age 65 from a gunshot wound), grandmother to 2 grandchildren who I last saw 72 months ago. She has a history of hypertension, hyperlipidemia, type 2 diabetes, and obstructive sleep apnea with a strong family history for heart disease with her father dying of an MI at age 73, and a sister, who is also a patient of mine, has had 2 myocardial infractions. She has had fibromyalgia as well as right nephrectomy back in 2003 for renal cell carcinoma. She has had bariatric surgery performed at [facility] in February of last year and a remote Nissen fundoplication back in [id number]. She was catheterized by Dr. Einar Gip in 2003 and was found to have normal coronary arteries and normal LV function. She was complaining of chest pain radiating to her jaw. A Myoview stress test performed in March 2013 was nonischemic. I increased her Imdur from 30 mg to 60 mg a day, which resulted in significant improvement in her chest pain. Since I saw her one year ago she's remained clinically stable and denies chest pain or shortness of breath. She did recently come back between in the [facility] where she did hiking and is recovering from that trip.   Current Outpatient Prescriptions  Medication Sig Dispense Refill  . allopurinol (ZYLOPRIM) 300 MG tablet Take 300 mg by mouth daily.        Marland Kitchen atenolol (TENORMIN) 100 MG tablet Take 100 mg by mouth 2 (two) times daily.        . cholecalciferol (VITAMIN D) 1000 UNITS tablet Take 1,000 Units by mouth daily.      . cyclobenzaprine (FLEXERIL) 10 MG tablet Take 10 mg by mouth 3 (three) times daily as needed.        . DULoxetine (CYMBALTA) 60 MG capsule Take 60 mg by mouth daily.        . furosemide (LASIX) 80 MG  tablet Take 40 mg by mouth 2 (two) times daily.       . insulin glargine (LANTUS) 100 UNIT/ML injection Inject 50 Units into the skin 2 (two) times daily.       . isosorbide mononitrate (IMDUR) 60 MG 24 hr tablet Take 1 tablet (60 mg total) by mouth daily.  30 tablet  11  . Multiple Vitamin (MULTIVITAMIN) capsule Take 1 capsule by mouth daily.        . rosuvastatin (CRESTOR) 20 MG tablet Take 20 mg by mouth daily.        Marland Kitchen telmisartan (MICARDIS) 80 MG tablet Take 80 mg by mouth daily.        . verapamil (CALAN) 120 MG tablet Take 120 mg by mouth once.      . vitamin B-12 (CYANOCOBALAMIN) 1000 MCG tablet Take 1,000 mcg by mouth daily.         No current facility-administered medications for this visit.    Allergies  Allergen Reactions  . Demerol [Meperidine] Shortness Of Breath    Stop breathing  . Hydrocodone-Acetaminophen Shortness Of Breath    REACTION: stop breathing  . Meperidine Hcl Shortness Of Breath    REACTION: stop breathing  . Vicodin [Hydrocodone-Acetaminophen] Shortness Of Breath    Stop breathing  . Codeine     REACTION: hallucination, confused  . Hydromorphone  Hcl     REACTION: itching, rash, nausea, vomiting  . Morphine     REACTION: vomiting  . Naproxen     REACTION: stomach lesion  . Dilaudid [Hydromorphone Hcl] Itching, Nausea And Vomiting and Rash    History   Social History  . Marital Status: Married    Spouse Name: N/A    Number of Children: N/A  . Years of Education: N/A   Occupational History  . Housewife    Social History Main Topics  . Smoking status: Never Smoker   . Smokeless tobacco: Never Used  . Alcohol Use: No  . Drug Use: No  . Sexual Activity: Not on file   Other Topics Concern  . Not on file   Social History Narrative  . No narrative on file     Review of Systems: General: negative for chills, fever, night sweats or weight changes.  Cardiovascular: negative for chest pain, dyspnea on exertion, edema, orthopnea,  palpitations, paroxysmal nocturnal dyspnea or shortness of breath Dermatological: negative for rash Respiratory: negative for cough or wheezing Urologic: negative for hematuria Abdominal: negative for nausea, vomiting, diarrhea, bright red blood per rectum, melena, or hematemesis Neurologic: negative for visual changes, syncope, or dizziness All other systems reviewed and are otherwise negative except as noted above.    Blood pressure 120/62, pulse 64, height 5\' 2"  (1.575 m), weight 228 lb 6.4 oz (103.602 kg).  General appearance: alert and no distress Neck: no adenopathy, no carotid bruit, no JVD, supple, symmetrical, trachea midline and thyroid not enlarged, symmetric, no tenderness/mass/nodules Lungs: clear to auscultation bilaterally Heart: regular rate and rhythm, S1, S2 normal, no murmur, click, rub or gallop Extremities: extremities normal, atraumatic, no cyanosis or edema  EKG Normal sinus rhythm at 64 without ST or T wave changes  ASSESSMENT AND PLAN:   HYPERLIPIDEMIA On statin therapy with most recent lipid profile performed 07/07/13 which revealed a total cholesterol of 109, LDL 38 and HDL of 40  HYPERTENSION Controlled on current medications      Lorretta Harp MD [facility], [facility] 09/02/2013 11:27 AM

## 2013-09-02 NOTE — Assessment & Plan Note (Signed)
Controlled on current medications 

## 2013-09-02 NOTE — Assessment & Plan Note (Signed)
On statin therapy with most recent lipid profile performed 07/07/13 which revealed a total cholesterol of 109, LDL 38 and HDL of 40

## 2013-09-02 NOTE — Patient Instructions (Signed)
Your physician recommends that you schedule a follow-up appointment in: One year with Dr.Berry

## 2013-09-07 ENCOUNTER — Other Ambulatory Visit: Payer: Self-pay | Admitting: Cardiovascular Disease

## 2013-09-08 NOTE — Telephone Encounter (Signed)
Rx was sent to pharmacy electronically. 

## 2013-09-17 DIAGNOSIS — E119 Type 2 diabetes mellitus without complications: Secondary | ICD-10-CM | POA: Diagnosis not present

## 2013-09-17 DIAGNOSIS — H2510 Age-related nuclear cataract, unspecified eye: Secondary | ICD-10-CM | POA: Diagnosis not present

## 2013-09-21 DIAGNOSIS — H2510 Age-related nuclear cataract, unspecified eye: Secondary | ICD-10-CM | POA: Diagnosis not present

## 2013-10-05 DIAGNOSIS — Z79899 Other long term (current) drug therapy: Secondary | ICD-10-CM | POA: Diagnosis not present

## 2013-10-05 DIAGNOSIS — M109 Gout, unspecified: Secondary | ICD-10-CM | POA: Diagnosis not present

## 2013-10-05 DIAGNOSIS — E119 Type 2 diabetes mellitus without complications: Secondary | ICD-10-CM | POA: Diagnosis not present

## 2013-10-05 DIAGNOSIS — E569 Vitamin deficiency, unspecified: Secondary | ICD-10-CM | POA: Diagnosis not present

## 2013-10-12 DIAGNOSIS — I10 Essential (primary) hypertension: Secondary | ICD-10-CM | POA: Diagnosis not present

## 2013-10-12 DIAGNOSIS — H2589 Other age-related cataract: Secondary | ICD-10-CM | POA: Diagnosis not present

## 2013-10-12 DIAGNOSIS — E1129 Type 2 diabetes mellitus with other diabetic kidney complication: Secondary | ICD-10-CM | POA: Diagnosis not present

## 2013-10-12 DIAGNOSIS — H2510 Age-related nuclear cataract, unspecified eye: Secondary | ICD-10-CM | POA: Diagnosis not present

## 2013-10-28 DIAGNOSIS — D649 Anemia, unspecified: Secondary | ICD-10-CM | POA: Diagnosis not present

## 2013-10-28 DIAGNOSIS — I129 Hypertensive chronic kidney disease with stage 1 through stage 4 chronic kidney disease, or unspecified chronic kidney disease: Secondary | ICD-10-CM | POA: Diagnosis not present

## 2013-10-28 DIAGNOSIS — E559 Vitamin D deficiency, unspecified: Secondary | ICD-10-CM | POA: Diagnosis not present

## 2013-10-28 DIAGNOSIS — Z905 Acquired absence of kidney: Secondary | ICD-10-CM | POA: Diagnosis not present

## 2013-10-28 DIAGNOSIS — N189 Chronic kidney disease, unspecified: Secondary | ICD-10-CM | POA: Diagnosis not present

## 2013-11-04 DIAGNOSIS — H2510 Age-related nuclear cataract, unspecified eye: Secondary | ICD-10-CM | POA: Diagnosis not present

## 2013-11-09 DIAGNOSIS — N390 Urinary tract infection, site not specified: Secondary | ICD-10-CM | POA: Diagnosis not present

## 2013-11-16 DIAGNOSIS — H2512 Age-related nuclear cataract, left eye: Secondary | ICD-10-CM | POA: Diagnosis not present

## 2013-11-16 DIAGNOSIS — H25812 Combined forms of age-related cataract, left eye: Secondary | ICD-10-CM | POA: Diagnosis not present

## 2013-11-26 DIAGNOSIS — H3563 Retinal hemorrhage, bilateral: Secondary | ICD-10-CM | POA: Diagnosis not present

## 2013-11-26 DIAGNOSIS — G4730 Sleep apnea, unspecified: Secondary | ICD-10-CM | POA: Diagnosis not present

## 2013-11-26 DIAGNOSIS — E119 Type 2 diabetes mellitus without complications: Secondary | ICD-10-CM | POA: Diagnosis not present

## 2013-11-27 ENCOUNTER — Other Ambulatory Visit: Payer: Self-pay

## 2013-12-10 DIAGNOSIS — Z23 Encounter for immunization: Secondary | ICD-10-CM | POA: Diagnosis not present

## 2013-12-14 ENCOUNTER — Other Ambulatory Visit (HOSPITAL_COMMUNITY): Payer: Self-pay | Admitting: Internal Medicine

## 2013-12-14 DIAGNOSIS — Z1231 Encounter for screening mammogram for malignant neoplasm of breast: Secondary | ICD-10-CM

## 2013-12-31 DIAGNOSIS — H35042 Retinal micro-aneurysms, unspecified, left eye: Secondary | ICD-10-CM | POA: Diagnosis not present

## 2013-12-31 DIAGNOSIS — H35041 Retinal micro-aneurysms, unspecified, right eye: Secondary | ICD-10-CM | POA: Diagnosis not present

## 2013-12-31 DIAGNOSIS — H3561 Retinal hemorrhage, right eye: Secondary | ICD-10-CM | POA: Diagnosis not present

## 2014-01-12 DIAGNOSIS — E109 Type 1 diabetes mellitus without complications: Secondary | ICD-10-CM | POA: Diagnosis not present

## 2014-01-12 DIAGNOSIS — N189 Chronic kidney disease, unspecified: Secondary | ICD-10-CM | POA: Diagnosis not present

## 2014-01-12 DIAGNOSIS — Z79899 Other long term (current) drug therapy: Secondary | ICD-10-CM | POA: Diagnosis not present

## 2014-01-15 ENCOUNTER — Ambulatory Visit (HOSPITAL_COMMUNITY): Payer: PRIVATE HEALTH INSURANCE

## 2014-01-18 DIAGNOSIS — N189 Chronic kidney disease, unspecified: Secondary | ICD-10-CM | POA: Diagnosis not present

## 2014-01-18 DIAGNOSIS — G4700 Insomnia, unspecified: Secondary | ICD-10-CM | POA: Diagnosis not present

## 2014-01-18 DIAGNOSIS — E1129 Type 2 diabetes mellitus with other diabetic kidney complication: Secondary | ICD-10-CM | POA: Diagnosis not present

## 2014-01-22 ENCOUNTER — Ambulatory Visit (HOSPITAL_COMMUNITY): Payer: PRIVATE HEALTH INSURANCE

## 2014-01-27 ENCOUNTER — Ambulatory Visit (HOSPITAL_COMMUNITY)
Admission: RE | Admit: 2014-01-27 | Discharge: 2014-01-27 | Disposition: A | Discharge status: Home / Self Care | Payer: Medicare Other | Source: Ambulatory Visit | Attending: Internal Medicine | Admitting: Internal Medicine

## 2014-01-27 DIAGNOSIS — Z1231 Encounter for screening mammogram for malignant neoplasm of breast: Secondary | ICD-10-CM | POA: Insufficient documentation

## 2014-01-28 ENCOUNTER — Encounter: Payer: Medicare Other | Attending: Internal Medicine | Admitting: Nutrition

## 2014-02-25 ENCOUNTER — Encounter: Payer: Self-pay | Admitting: Nutrition

## 2014-02-25 ENCOUNTER — Encounter: Payer: Medicare Other | Attending: Internal Medicine | Admitting: Nutrition

## 2014-02-25 VITALS — Ht 62.5 in | Wt 224.0 lb

## 2014-02-25 DIAGNOSIS — Z794 Long term (current) use of insulin: Secondary | ICD-10-CM | POA: Diagnosis not present

## 2014-02-25 DIAGNOSIS — E118 Type 2 diabetes mellitus with unspecified complications: Secondary | ICD-10-CM | POA: Insufficient documentation

## 2014-02-25 DIAGNOSIS — IMO0002 Reserved for concepts with insufficient information to code with codable children: Secondary | ICD-10-CM

## 2014-02-25 DIAGNOSIS — E1165 Type 2 diabetes mellitus with hyperglycemia: Secondary | ICD-10-CM

## 2014-02-25 DIAGNOSIS — Z713 Dietary counseling and surveillance: Secondary | ICD-10-CM | POA: Insufficient documentation

## 2014-02-25 NOTE — Progress Notes (Signed)
  Medical Nutrition Therapy:  Appt start time: 1330 end time:  1430.  Assessment:  Primary concerns today: Diabetes. Live with her husband. Shops and and cooks. Testing a few times a week. Not daily. A1C was 8.6%. Non compliant with testing and sometimes forgets her insulin at meal times..Only has 1 kidney. Taking Lantus 50 unit twice a day, Metformin 500 MG ER daily, Diet is inconsistent in CHO intake and timing of meals and medications. Drinks excessive amounts of coffee and not enough water.  Preferred Learning Style:     No preference indicated   Learning Readiness:     Ready  Change in progress   MEDICATIONS: see list   DIETARY INTAKE:  24-hr recall:  B ( AM): Cofee Snk ( AM): A piece of fruit L ( PM): skipped: sandwich or bowl of soup Snk ( PM): fruit sometimes. D ( PM): Baked chicken, brown rice, salad, water Snk ( PM): fruit Beverages: water, green tea unsweetened  Usual physical activity: ADL's; walks some  Estimated energy needs: 1200 calories 135 g carbohydrates 90 g protein 33 g fat  Progress Towards Goal(s):  In progress.   Nutritional Diagnosis:  NB-1.1 Food and nutrition-related knowledge deficit As related to Diabetes.  As evidenced by A1C 8.6%.    Intervention:  Nutrition counseling on diabetes, CHO counting, meal planning, target ranges for blood sugars, treatment of hyper/hypoglycemia and complications from DM and benefits of exercise for needed weight loss..  Plan  1. Cut out snacks between meals.  2 Eat meals at regular times B) 6-8 am, Lujnch 12-2 and DInner 5-7 pm. Do not eat between meals. 3. Cut down on coffee and drink 5 bottles of water per day. 4. No cakes, candies, sweets, desserts, cookies or diet sodas. 5. Increase fresh fruits and vegetables. 6. Test blood sugar before meals and take appropriate amount on Novolog based on blood sugar reading. Do not skip medications or meals. Aim for fasting blood sugars before meals to be less  than 130 mg/dl and 2 hours after meal less than 180 mg/dl. Goal: Get A1C to 7.5% in three months.2. Walk 15 minutes twice a day. 3. Lose 1 lb per week.  Teaching Method Utilized:  Visual Auditory Hands on  Handouts given during visit include: The Plate Method Carb Counting and Food Label handouts Meal Plan Card  Barriers to learning/adherence to lifestyle change: none  Demonstrated degree of understanding via:  Teach Back   Monitoring/Evaluation:  Dietary intake, exercise, meal planning, SBG, and body weight in 1 month(s).

## 2014-02-25 NOTE — Patient Instructions (Signed)
Plan  1. Cut out snacks between meals.  2 Eat meals at regular times B) 6-8 am, Lujnch 12-2 and DInner 5-7 pm. Do not eat between meals. 3. Cut down on coffee and drink 5 bottles of water per day. 4. No cakes, candies, sweets, desserts, cookies or diet sodas. 5. Increase fresh fruits and vegetables. 6. Test blood sugar before meals and take appropriate amount on Novolog based on blood sugar reading. Do not skip medications or meals. Aim for fasting blood sugars before meals to be less than 130 mg/dl and 2 hours after meal less than 180 mg/dl. Goal: Get A1C to 7.5% in three months.2. Walk 15 minutes twice a day. 3. Lose 1 lb per week.

## 2014-04-07 ENCOUNTER — Ambulatory Visit: Payer: Medicare Other | Admitting: Nutrition

## 2014-04-19 DIAGNOSIS — E119 Type 2 diabetes mellitus without complications: Secondary | ICD-10-CM | POA: Diagnosis not present

## 2014-04-19 DIAGNOSIS — Z79899 Other long term (current) drug therapy: Secondary | ICD-10-CM | POA: Diagnosis not present

## 2014-04-26 DIAGNOSIS — F339 Major depressive disorder, recurrent, unspecified: Secondary | ICD-10-CM | POA: Diagnosis not present

## 2014-04-26 DIAGNOSIS — E1129 Type 2 diabetes mellitus with other diabetic kidney complication: Secondary | ICD-10-CM | POA: Diagnosis not present

## 2014-04-26 DIAGNOSIS — N183 Chronic kidney disease, stage 3 (moderate): Secondary | ICD-10-CM | POA: Diagnosis not present

## 2014-05-04 ENCOUNTER — Encounter (HOSPITAL_COMMUNITY): Payer: Self-pay | Admitting: *Deleted

## 2014-05-04 ENCOUNTER — Emergency Department (HOSPITAL_COMMUNITY): Payer: Medicare Other

## 2014-05-04 ENCOUNTER — Emergency Department (HOSPITAL_COMMUNITY)
Admission: EM | Admit: 2014-05-04 | Discharge: 2014-05-04 | Disposition: A | Discharge status: Home / Self Care | Payer: Medicare Other | Attending: Emergency Medicine | Admitting: Emergency Medicine

## 2014-05-04 DIAGNOSIS — Y9389 Activity, other specified: Secondary | ICD-10-CM | POA: Insufficient documentation

## 2014-05-04 DIAGNOSIS — E785 Hyperlipidemia, unspecified: Secondary | ICD-10-CM | POA: Diagnosis not present

## 2014-05-04 DIAGNOSIS — S4992XA Unspecified injury of left shoulder and upper arm, initial encounter: Secondary | ICD-10-CM | POA: Insufficient documentation

## 2014-05-04 DIAGNOSIS — I25119 Atherosclerotic heart disease of native coronary artery with unspecified angina pectoris: Secondary | ICD-10-CM | POA: Insufficient documentation

## 2014-05-04 DIAGNOSIS — I10 Essential (primary) hypertension: Secondary | ICD-10-CM | POA: Insufficient documentation

## 2014-05-04 DIAGNOSIS — S0083XA Contusion of other part of head, initial encounter: Secondary | ICD-10-CM | POA: Diagnosis not present

## 2014-05-04 DIAGNOSIS — Y998 Other external cause status: Secondary | ICD-10-CM | POA: Insufficient documentation

## 2014-05-04 DIAGNOSIS — E119 Type 2 diabetes mellitus without complications: Secondary | ICD-10-CM | POA: Diagnosis not present

## 2014-05-04 DIAGNOSIS — Y9289 Other specified places as the place of occurrence of the external cause: Secondary | ICD-10-CM | POA: Insufficient documentation

## 2014-05-04 DIAGNOSIS — Z85528 Personal history of other malignant neoplasm of kidney: Secondary | ICD-10-CM | POA: Diagnosis not present

## 2014-05-04 DIAGNOSIS — M25512 Pain in left shoulder: Secondary | ICD-10-CM | POA: Diagnosis not present

## 2014-05-04 DIAGNOSIS — W19XXXA Unspecified fall, initial encounter: Secondary | ICD-10-CM

## 2014-05-04 DIAGNOSIS — Z794 Long term (current) use of insulin: Secondary | ICD-10-CM | POA: Diagnosis not present

## 2014-05-04 DIAGNOSIS — Z8541 Personal history of malignant neoplasm of cervix uteri: Secondary | ICD-10-CM | POA: Diagnosis not present

## 2014-05-04 DIAGNOSIS — S0990XA Unspecified injury of head, initial encounter: Secondary | ICD-10-CM | POA: Diagnosis not present

## 2014-05-04 DIAGNOSIS — Z8659 Personal history of other mental and behavioral disorders: Secondary | ICD-10-CM | POA: Insufficient documentation

## 2014-05-04 DIAGNOSIS — Z8739 Personal history of other diseases of the musculoskeletal system and connective tissue: Secondary | ICD-10-CM | POA: Diagnosis not present

## 2014-05-04 DIAGNOSIS — W010XXA Fall on same level from slipping, tripping and stumbling without subsequent striking against object, initial encounter: Secondary | ICD-10-CM | POA: Insufficient documentation

## 2014-05-04 DIAGNOSIS — Z79899 Other long term (current) drug therapy: Secondary | ICD-10-CM | POA: Insufficient documentation

## 2014-05-04 LAB — CBC WITH DIFFERENTIAL/PLATELET
Basophils Absolute: 0.1 10*3/uL (ref 0.0–0.1)
Basophils Relative: 1 % (ref 0–1)
EOS PCT: 3 % (ref 0–5)
Eosinophils Absolute: 0.2 10*3/uL (ref 0.0–0.7)
HEMATOCRIT: 37 % (ref 36.0–46.0)
HEMOGLOBIN: 11.5 g/dL — AB (ref 12.0–15.0)
LYMPHS PCT: 30 % (ref 12–46)
Lymphs Abs: 1.6 10*3/uL (ref 0.7–4.0)
MCH: 26.3 pg (ref 26.0–34.0)
MCHC: 31.1 g/dL (ref 30.0–36.0)
MCV: 84.5 fL (ref 78.0–100.0)
MONO ABS: 0.3 10*3/uL (ref 0.1–1.0)
MONOS PCT: 6 % (ref 3–12)
NEUTROS ABS: 3.3 10*3/uL (ref 1.7–7.7)
NEUTROS PCT: 60 % (ref 43–77)
Platelets: 232 10*3/uL (ref 150–400)
RBC: 4.38 MIL/uL (ref 3.87–5.11)
RDW: 16.5 % — ABNORMAL HIGH (ref 11.5–15.5)
WBC: 5.5 10*3/uL (ref 4.0–10.5)

## 2014-05-04 LAB — BASIC METABOLIC PANEL
Anion gap: 6 (ref 5–15)
BUN: 33 mg/dL — AB (ref 6–23)
CHLORIDE: 106 mmol/L (ref 96–112)
CO2: 25 mmol/L (ref 19–32)
CREATININE: 1.73 mg/dL — AB (ref 0.50–1.10)
Calcium: 9.3 mg/dL (ref 8.4–10.5)
GFR calc Af Amer: 34 mL/min — ABNORMAL LOW (ref >90)
GFR, EST NON AFRICAN AMERICAN: 30 mL/min — AB (ref >90)
GLUCOSE: 187 mg/dL — AB (ref 70–99)
Potassium: 4.4 mmol/L (ref 3.5–5.1)
SODIUM: 137 mmol/L (ref 135–145)

## 2014-05-04 LAB — CBG MONITORING, ED: GLUCOSE-CAPILLARY: 191 mg/dL — AB (ref 70–99)

## 2014-05-04 NOTE — ED Notes (Signed)
[address] [address] this am, ? Lost balance, Denies LOC, alert, sl nausea,  Swelling, contusion to lt cheek.  Alert, No neck pain

## 2014-05-04 NOTE — ED Notes (Signed)
Incorrect entry on initial vitals. Incorrect pt.

## 2014-05-04 NOTE — ED Provider Notes (Signed)
CSN: [id number]     Arrival date & time 05/04/14  1126 History  This chart was scribed for Sherwood Gambler, MD by Zola Button, ED Scribe. This patient was seen in room APA14/APA14 and the patient's care was started at 12:06 PM.      Chief Complaint  Patient presents with  . Fall   The history is provided by the patient. No language interpreter was used.   HPI Comments: Margaret Huerta is a 69 y.o. female who presents to the Emergency Department complaining of a fall that occurred this morning. Patient was ambulating on the sidewalk when she fell onto the ground, the left side of her head. She presents with ecchymoses to the left side of her face. Patient also reports having a "funny" feeling to her nose and eyes, dull headache, nausea, left shoulder pain that may be due to the fall, but she is unsure. She does not remember the actual fall, but does remember everything else. She states that she has been falling often for the past few years, but with increased frequency of falls recently. Patient was noted to have fallen last week at a grocery store. As of 6 months ago, patient notes that she has been tending to lean to the left while ambulating.She has seen her PCP regarding her frequent falls, but she has not had any testing done yet. Patient denies lightheadedness, dizziness, SOB, LOC, chest pain, nausea, visual changes, eye pain, nosebleeds, weakness and numbness. She also denies tripping and EtOH use. She is on ASA.    Past Medical History  Diagnosis Date  . Diabetes mellitus   . Hyperlipidemia   . Hypertension   . Angina pectoris   . Cervical cancer   . Thyroid disease     parathyroid  . Neuropathy   . Osteoporosis   . Fibromyalgia   . Depression   . Renal cancer   . Coronary artery disease    Past Surgical History  Procedure Laterality Date  . Nephrectomy      right  . Cholecystectomy    . Back surgery    . Partial hysterectomy    . Appendectomy    . Tmj arthroplasty    .  Dilation and curettage of uterus    . Tonsillectomy    . Abdominal hysterectomy    . Incontinence surgery    . Kidney Hodge surgery    . Mouth surgery    . Upper gi endoscopy    . Knee arthroscopy    . Gastric bypass  2012  . Cardiac catheterization    . Eye surgery     Family History  Problem Relation Age of Onset  . Emphysema Father   . Heart disease Father   . Allergies      whole family per pt  . Clotting disorder Mother   . Diabetes Mother   . Kidney disease Mother   . Lung cancer Maternal Uncle    History  Substance Use Topics  . Smoking status: Never Smoker   . Smokeless tobacco: Never Used  . Alcohol Use: No   OB History    No data available     Review of Systems  HENT: Negative for nosebleeds.   Eyes: Negative for pain and visual disturbance.  Respiratory: Negative for shortness of breath.   Cardiovascular: Negative for chest pain.  Gastrointestinal: Positive for nausea.  Musculoskeletal: Positive for gait problem.  Skin: Positive for wound.  Neurological: Positive for headaches. Negative for dizziness,  syncope, weakness, light-headedness and numbness.  All other systems reviewed and are negative.     Allergies  Demerol; Hydrocodone-acetaminophen; Meperidine hcl; Vicodin; Codeine; Fentanyl; Hydromorphone hcl; Morphine; Naproxen; and Dilaudid  Home Medications   Prior to Admission medications   Medication Sig Start Date End Date Taking? Authorizing Provider  allopurinol (ZYLOPRIM) 300 MG tablet Take 300 mg by mouth daily.      Historical Provider, MD  atenolol (TENORMIN) 100 MG tablet Take 100 mg by mouth 2 (two) times daily.      Historical Provider, MD  cholecalciferol (VITAMIN D) 1000 UNITS tablet Take 1,000 Units by mouth daily.    Historical Provider, MD  cyclobenzaprine (FLEXERIL) 10 MG tablet Take 10 mg by mouth 3 (three) times daily as needed.      Historical Provider, MD  DULoxetine (CYMBALTA) 60 MG capsule Take 60 mg by mouth daily.       Historical Provider, MD  furosemide (LASIX) 80 MG tablet Take 40 mg by mouth 2 (two) times daily.     Historical Provider, MD  insulin aspart (NOVOLOG) 100 UNIT/ML injection Inject 5 Units into the skin 3 (three) times daily before meals.    Historical Provider, MD  insulin glargine (LANTUS) 100 UNIT/ML injection Inject 50 Units into the skin 2 (two) times daily.     Historical Provider, MD  isosorbide mononitrate (IMDUR) 60 MG 24 hr tablet TAKE ONE TABLET BY MOUTH ONCE DAILY    Lorretta Harp, MD  metFORMIN (GLUCOPHAGE-XR) 500 MG 24 hr tablet Take 500 mg by mouth daily with breakfast.    Historical Provider, MD  Multiple Vitamin (MULTIVITAMIN) capsule Take 1 capsule by mouth daily.      Historical Provider, MD  rosuvastatin (CRESTOR) 20 MG tablet Take 20 mg by mouth daily.      Historical Provider, MD  telmisartan (MICARDIS) 80 MG tablet Take 80 mg by mouth daily.      Historical Provider, MD  verapamil (CALAN) 120 MG tablet Take 120 mg by mouth once.    Historical Provider, MD  vitamin B-12 (CYANOCOBALAMIN) 1000 MCG tablet Take 1,000 mcg by mouth daily.      Historical Provider, MD   BP 135/67 mmHg  Pulse 58  Temp(Src) 98.6 F (37 C) (Oral)  Resp 16  Ht 5\' 2"  (1.575 m)  Wt 221 lb (100.245 kg)  BMI 40.41 kg/m2  SpO2 99% Physical Exam  Constitutional: She is oriented to person, place, and time. She appears well-developed and well-nourished. No distress.  HENT:  Head: Normocephalic.  Mouth/Throat: Oropharynx is clear and moist. No oropharyngeal exudate.  Ecchymoses over left maxilla.  Eyes: EOM are normal. Pupils are equal, round, and reactive to light.  Neck: Neck supple.  Cardiovascular: Normal rate, regular rhythm and normal heart sounds.   Pulmonary/Chest: Effort normal and breath sounds normal.  Abdominal: She exhibits no distension. There is no tenderness.  Musculoskeletal: She exhibits no edema.  Neurological: She is alert and oriented to person, place, and time. No cranial  nerve deficit.  CN 2-12 grossly intact. 5/5 strength in all 4 extremities. Normal finger to nose  Skin: Skin is warm and dry. No rash noted.  Psychiatric: She has a normal mood and affect. Her behavior is normal.  Nursing note and vitals reviewed.   ED Course  Procedures  DIAGNOSTIC STUDIES: Oxygen Saturation is 99% on room air, normal by my interpretation.    COORDINATION OF CARE: 12:13 PM-Discussed treatment plan which includes imaging and labs with pt  at bedside and pt agreed to plan.    Labs Review Labs Reviewed  BASIC METABOLIC PANEL - Abnormal; Notable for the following:    Glucose, Bld 187 (*)    BUN 33 (*)    Creatinine, Ser 1.73 (*)    GFR calc non Af Amer 30 (*)    GFR calc Af Amer 34 (*)    All other components within normal limits  CBC WITH DIFFERENTIAL/PLATELET - Abnormal; Notable for the following:    Hemoglobin 11.5 (*)    RDW 16.5 (*)    All other components within normal limits  CBG MONITORING, ED - Abnormal; Notable for the following:    Glucose-Capillary 191 (*)    All other components within normal limits    Imaging Review Dg Clavicle Left  05/04/2014   CLINICAL DATA:  Fall, acute left shoulder/ clavicle pain  EXAM: LEFT CLAVICLE - 2+ VIEWS  COMPARISON:  None.  FINDINGS: There is no evidence of fracture or other focal bone lesions. Soft tissues are unremarkable.  IMPRESSION: Negative.   Electronically Signed   By: Jerilynn Mages.  Shick M.D.   On: 05/04/2014 13:46   Ct Head Wo Contrast  05/04/2014   CLINICAL DATA:  Fall, left face bruising  EXAM: CT HEAD WITHOUT CONTRAST  CT MAXILLOFACIAL WITHOUT CONTRAST  TECHNIQUE: Multidetector CT imaging of the head and maxillofacial structures were performed using the standard protocol without intravenous contrast. Multiplanar CT image reconstructions of the maxillofacial structures were also generated.  COMPARISON:  None.  FINDINGS: CT HEAD FINDINGS  No skull fracture is noted. Paranasal sinuses and mastoid air cells are  unremarkable.  No intracranial hemorrhage, mass effect or midline shift. No acute infarction. Mild periventricular white matter decreased attenuation probable due to chronic small vessel ischemic changes. No mass lesion is noted on this unenhanced scan.  CT MAXILLOFACIAL FINDINGS  Mild mucosal thickening posterior inferior aspect bilateral maxillary sinus. No nasal bone fracture is noted. No intraorbital hematoma. No zygomatic fracture is noted. There is mild soft tissue swelling and mild subcutaneous stranding left face infraorbital. No facial fluid collection. Metallic dental artifacts are noted mandibular region and maxillary region.  No paranasal sinuses air-fluid levels.  There is right nasal septum deviation. Bilateral semilunar canal is patent. No orbital rim or orbital floor fracture. There is no TMJ dislocation. No mandibular fracture is noted.  Sagittal images shows no maxillary spine fracture. The nasopharyngeal and oropharyngeal airway is patent. Mild degenerative changes C1-C2 articulation.  IMPRESSION: 1. No acute intracranial abnormality. 2. Mild periventricular white matter decreased attenuation probable due to chronic small vessel ischemic changes. 3. No facial fractures are noted. No zygomatic fracture. No orbital rim or orbital floor fracture. No intraorbital hematoma. 4. There is mild soft tissue swelling and subcutaneous stranding left face infraorbital region. No facial fluid collection. 5. Mild mucosal thickening inferior posterior aspect bilateral maxillary sinus. No paranasal sinuses air-fluid levels. 6. Patent oropharyngeal and nasopharyngeal airway. 7. There is right nasal septum deviation.   Electronically Signed   By: Lahoma Crocker M.D.   On: 05/04/2014 13:31   Ct Maxillofacial Wo Cm  05/04/2014   CLINICAL DATA:  Fall, left face bruising  EXAM: CT HEAD WITHOUT CONTRAST  CT MAXILLOFACIAL WITHOUT CONTRAST  TECHNIQUE: Multidetector CT imaging of the head and maxillofacial structures were  performed using the standard protocol without intravenous contrast. Multiplanar CT image reconstructions of the maxillofacial structures were also generated.  COMPARISON:  None.  FINDINGS: CT HEAD FINDINGS  No skull fracture  is noted. Paranasal sinuses and mastoid air cells are unremarkable.  No intracranial hemorrhage, mass effect or midline shift. No acute infarction. Mild periventricular white matter decreased attenuation probable due to chronic small vessel ischemic changes. No mass lesion is noted on this unenhanced scan.  CT MAXILLOFACIAL FINDINGS  Mild mucosal thickening posterior inferior aspect bilateral maxillary sinus. No nasal bone fracture is noted. No intraorbital hematoma. No zygomatic fracture is noted. There is mild soft tissue swelling and mild subcutaneous stranding left face infraorbital. No facial fluid collection. Metallic dental artifacts are noted mandibular region and maxillary region.  No paranasal sinuses air-fluid levels.  There is right nasal septum deviation. Bilateral semilunar canal is patent. No orbital rim or orbital floor fracture. There is no TMJ dislocation. No mandibular fracture is noted.  Sagittal images shows no maxillary spine fracture. The nasopharyngeal and oropharyngeal airway is patent. Mild degenerative changes C1-C2 articulation.  IMPRESSION: 1. No acute intracranial abnormality. 2. Mild periventricular white matter decreased attenuation probable due to chronic small vessel ischemic changes. 3. No facial fractures are noted. No zygomatic fracture. No orbital rim or orbital floor fracture. No intraorbital hematoma. 4. There is mild soft tissue swelling and subcutaneous stranding left face infraorbital region. No facial fluid collection. 5. Mild mucosal thickening inferior posterior aspect bilateral maxillary sinus. No paranasal sinuses air-fluid levels. 6. Patent oropharyngeal and nasopharyngeal airway. 7. There is right nasal septum deviation.   Electronically Signed    By: Lahoma Crocker M.D.   On: 05/04/2014 13:31     EKG Interpretation   Date/Time:  Tuesday May 04 2014 12:34:57 EDT Ventricular Rate:  56 PR Interval:  132 QRS Duration: 91 QT Interval:  430 QTC Calculation: 415 R Axis:   36 Text Interpretation:  Sinus rhythm Low voltage, precordial leads Abnormal  R-wave progression, early transition no significant change since 2005  Confirmed by Wilder ([id number]) on 05/04/2014 1:12:32 PM      MDM   Final diagnoses:  Fall, initial encounter  Contusion of face, initial encounter    Patient has no significant injury on workup. Given she is not 100% sure it was mechanical labs and EKG obtained. It sounds like she's had falls like this before without clear etiology. No specific CVA like symptoms or syncopal symptoms. Cr 1.7, was 1.56 in December. Stable for discharge.   I personally performed the services described in this documentation, which was scribed in my presence. The recorded information has been reviewed and is accurate.    Sherwood Gambler, MD 05/04/14 [phone]

## 2014-05-10 ENCOUNTER — Telehealth: Payer: Self-pay | Admitting: Cardiovascular Disease

## 2014-05-10 NOTE — Telephone Encounter (Signed)
Episodes of "falling out".  Has had two episodes and does not remember falling. States she seems to walk sideways. Patient is extremely tired all the time.

## 2014-05-11 NOTE — Telephone Encounter (Signed)
Returned call to patient no answer.LMTC. 

## 2014-05-12 ENCOUNTER — Ambulatory Visit (INDEPENDENT_AMBULATORY_CARE_PROVIDER_SITE_OTHER): Payer: Medicare Other | Admitting: Nurse Practitioner

## 2014-05-12 ENCOUNTER — Encounter: Payer: Self-pay | Admitting: Nurse Practitioner

## 2014-05-12 ENCOUNTER — Encounter: Payer: Self-pay | Admitting: Radiology

## 2014-05-12 ENCOUNTER — Encounter (INDEPENDENT_AMBULATORY_CARE_PROVIDER_SITE_OTHER): Payer: Medicare Other

## 2014-05-12 ENCOUNTER — Ambulatory Visit: Payer: Medicare Other | Admitting: Nutrition

## 2014-05-12 VITALS — BP 131/74 | HR 59 | Ht 62.0 in | Wt 226.0 lb

## 2014-05-12 DIAGNOSIS — R55 Syncope and collapse: Secondary | ICD-10-CM | POA: Diagnosis not present

## 2014-05-12 DIAGNOSIS — R609 Edema, unspecified: Secondary | ICD-10-CM

## 2014-05-12 DIAGNOSIS — R079 Chest pain, unspecified: Secondary | ICD-10-CM | POA: Diagnosis not present

## 2014-05-12 LAB — TSH: TSH: 1.8 u[IU]/mL (ref 0.35–4.50)

## 2014-05-12 MED ORDER — FUROSEMIDE 40 MG PO TABS: 40.0000 mg | ORAL_TABLET | ORAL | Status: DC

## 2014-05-12 NOTE — Telephone Encounter (Signed)
Received call from patient.She stated she is blacking out.Stated she has been blacking out every week for the past 1 month.Stated she would like to be seen.Appointment scheduled with Truitt Merle NP today at 2:30 pm at [facility] office.

## 2014-05-12 NOTE — Progress Notes (Signed)
Patient ID: Margaret Huerta, female   DOB: 1946/02/02, 69 y.o.   MRN: [id number] [employer] 30 day monitor applied. EOS 06-11-14

## 2014-05-12 NOTE — Progress Notes (Signed)
CARDIOLOGY OFFICE NOTE  Date:  05/12/2014    [name] Date of Birth: [birth year] Huerta Medical Record [id number]  PCP:  [name], MD  Cardiologist:  Gwenlyn Found    Chief Complaint  Patient presents with  . Loss of Consciousness    Work in visit -seen for Dr. Gwenlyn Found     History of Present Illness: Margaret Huerta is a 70 y.o. female who presents today for a work in visit. Seen for Dr. Gwenlyn Found. She is moderately overweight, married Caucasian female, mother of 2 living children (one died at age 20 from a gunshot wound), and grandmother to 2 grandchildren. She has a history of hypertension, hyperlipidemia, type 2 diabetes, and obstructive sleep apnea with a strong family history for heart disease with her father dying of an MI at age 84, and a sister who has had 2 myocardial infractions. She has had fibromyalgia as well as right nephrectomy back in 2003 for renal cell carcinoma. She has had bariatric surgery performed at [facility] in February of last year and a remote Nissen fundoplication back in [id number]. She was catheterized by Dr. Einar Gip in 2003 and was found to have normal coronary arteries and normal LV function.  A Myoview stress test performed in March 2013 was nonischemic.  She was last seen by Dr. Gwenlyn Found in July of 2015. Felt to be stable. Had just been to the [facility].  In the ER last week with a "fall".   Phone call earlier this week:  Received call from patient.She stated she is blacking out.Stated she has been blacking out every week for the past 1 month.Stated she would like to be seen.Appointment scheduled with Truitt Merle NP today at 2:30 pm at [facility] office.           Thus added to the FLEX.  Comes in today. Here alone. Her sister brought her here today. She was doing ok up until about Christmas. She says she has had 10 to 12 episodes that she describes as a "fall". She notes she just "drops". No warning. No chest pain. No loss of bowel or bladder function. Always happens  when upright and not long after getting up. One spell she fell out of the bed. She does get dizzy with standing up. She has felt a little anxious. Maybe depressed. Pain in her legs and arms. Some palpitations. Her last fall was last Tuesday which led to the ER visit. She has had recent labs but no TSH. CT negative for acute abnormality. She has had more swelling with trying to cut her lasix back to just 40 mg a day.  Past Medical History  Diagnosis Date  . Diabetes mellitus   . Hyperlipidemia   . Hypertension   . Angina pectoris   . Cervical cancer   . Thyroid disease     parathyroid  . Neuropathy   . Osteoporosis   . Fibromyalgia   . Depression   . Renal cancer   . Coronary artery disease     Past Surgical History  Procedure Laterality Date  . Nephrectomy      right  . Cholecystectomy    . Back surgery    . Partial hysterectomy    . Appendectomy    . Tmj arthroplasty    . Dilation and curettage of uterus    . Tonsillectomy    . Abdominal hysterectomy    . Incontinence surgery    . Kidney Boutwell surgery    . Mouth  surgery    . Upper gi endoscopy    . Knee arthroscopy    . Gastric bypass  2012  . Cardiac catheterization    . Eye surgery       Medications: Current Outpatient Prescriptions  Medication Sig Dispense Refill  . allopurinol (ZYLOPRIM) 300 MG tablet Take 300 mg by mouth daily.      Marland Kitchen atenolol (TENORMIN) 100 MG tablet Take 50 mg by mouth 2 (two) times daily.    . cholecalciferol (VITAMIN D) 1000 UNITS tablet Take 1,000 Units by mouth daily.    . cyclobenzaprine (FLEXERIL) 10 MG tablet Take 10 mg by mouth 3 (three) times daily as needed.      . DULoxetine (CYMBALTA) 60 MG capsule Take 60 mg by mouth daily.      . furosemide (LASIX) 40 MG tablet Take 1 tablet (40 mg total) by mouth as directed. Take 40 mg in the AM and 20 mg in the PM    . gabapentin (NEURONTIN) 100 MG capsule Take 100 mg by mouth 3 (three) times daily.    . insulin aspart (NOVOLOG) 100 UNIT/ML  injection Inject 5 Units into the skin 3 (three) times daily before meals.    . insulin glargine (LANTUS) 100 UNIT/ML injection Inject 50 Units into the skin 2 (two) times daily.     . isosorbide mononitrate (IMDUR) 60 MG 24 hr tablet TAKE ONE TABLET BY MOUTH ONCE DAILY 30 tablet 11  . metFORMIN (GLUCOPHAGE-XR) 500 MG 24 hr tablet Take 1,000 mg by mouth daily with breakfast.     . Multiple Vitamin (MULTIVITAMIN) capsule Take 1 capsule by mouth daily.      . rosuvastatin (CRESTOR) 20 MG tablet Take 20 mg by mouth daily.      Marland Kitchen telmisartan (MICARDIS) 80 MG tablet Take 80 mg by mouth daily.      . verapamil (CALAN) 120 MG tablet Take 120 mg by mouth once.    . vitamin B-12 (CYANOCOBALAMIN) 1000 MCG tablet Take 1,000 mcg by mouth daily.       No current facility-administered medications for this visit.    Allergies: Allergies  Allergen Reactions  . Demerol [Meperidine] Shortness Of Breath    Stop breathing  . Hydrocodone-Acetaminophen Shortness Of Breath    REACTION: stop breathing  . Meperidine Hcl Shortness Of Breath    REACTION: stop breathing  . Vicodin [Hydrocodone-Acetaminophen] Shortness Of Breath    Stop breathing  . Codeine     REACTION: hallucination, confused  . Fentanyl   . Hydromorphone Hcl     REACTION: itching, rash, nausea, vomiting  . Morphine     REACTION: vomiting  . Naproxen     REACTION: stomach lesion  . Dilaudid [Hydromorphone Hcl] Itching, Nausea And Vomiting and Rash    Social History: The patient  reports that she has never smoked. She has never used smokeless tobacco. She reports that she does not drink alcohol or use illicit drugs.   Family History: The patient's family history includes Allergies in an other family member; Clotting disorder in her mother; Diabetes in her mother; Emphysema in her father; Heart disease in her father; Kidney disease in her mother; Lung cancer in her maternal uncle.   Review of Systems: Please see the history of present  illness.   Otherwise, the review of systems is positive for .   All other systems are reviewed and negative.   Physical Exam: VS:  BP 131/74 mmHg  Pulse 59  Ht 5'  2" (1.575 m)  Wt 226 lb (102.513 kg)  BMI 41.33 kg/m2 .  BMI Body mass index is 41.33 kg/(m^2).  Wt Readings from Last 3 Encounters:  05/12/14 226 lb (102.513 kg)  05/04/14 221 lb (100.245 kg)  02/25/14 224 lb (101.606 kg)    General: Pleasant. Well developed, well nourished and in no acute distress.  HEENT: Normal.She has ecchymosis down the left eye.  Neck: Supple, no JVD, carotid bruits, or masses noted.  Cardiac: Regular rate and rhythm. No murmurs, rubs, or gallops. No edema.  Respiratory:  Lungs are clear to auscultation bilaterally with normal work of breathing.  GI: Soft and nontender.  MS: No deformity or atrophy. Gait and ROM intact. Skin: Warm and dry. Color is normal.  Neuro:  Strength and sensation are intact and no gross focal deficits noted.  Psych: Alert, appropriate and with normal affect.   LABORATORY DATA:  EKG:  EKG is ordered today. This demonstrates sinus bradycardia.  Lab Results  Component Value Date   WBC 5.5 05/04/2014   HGB 11.5* 05/04/2014   HCT 37.0 05/04/2014   PLT 232 05/04/2014   GLUCOSE 187* 05/04/2014   NA 137 05/04/2014   K 4.4 05/04/2014   CL 106 05/04/2014   CREATININE 1.73* 05/04/2014   BUN 33* 05/04/2014   CO2 25 05/04/2014    BNP (last 3 results) No results for input(s): BNP in the last 8760 hours.  ProBNP (last 3 results) No results for input(s): PROBNP in the last 8760 hours.   Other Studies Reviewed Today:  CT HEAD IMPRESSION: 1. No acute intracranial abnormality. 2. Mild periventricular white matter decreased attenuation probable due to chronic small vessel ischemic changes. 3. No facial fractures are noted. No zygomatic fracture. No orbital rim or orbital floor fracture. No intraorbital hematoma. 4. There is mild soft tissue swelling and subcutaneous  stranding left face infraorbital region. No facial fluid collection. 5. Mild mucosal thickening inferior posterior aspect bilateral maxillary sinus. No paranasal sinuses air-fluid levels. 6. Patent oropharyngeal and nasopharyngeal airway. 7. There is right nasal septum deviation.   Electronically Signed  By: Lahoma Crocker M.D.  On: 05/04/2014 13:31   Assessment/Plan: 1. Syncope - numerous episodes. She is orthostatic (140/75 lying, 121/71 sitting and 115/69 standing) will cut her lasix back, cut her atenolol in half. Arrange 30 day event monitor. Arrange Lexiscan. See back in 5 weeks for discussion. She is not to drive. Will check TSH.   2. HTN - BP is ok - cutting medicines back today. She will monitor her BP at home.   3. Remote cath - +FH for CAD - arranging Myoview.   4. CKD - cutting Lasix back.   Current medicines are reviewed with the patient today.  The patient does not have concerns regarding medicines other than what has been noted above.  The following changes have been made:  See above.  Labs/ tests ordered today include:    Orders Placed This Encounter  Procedures  . TSH  . Myocardial Perfusion Imaging  . Cardiac event monitor     Disposition:   FU with Dr. Gwenlyn Found in 5 weeks.   Patient is agreeable to this plan and will call if any problems develop in the interim.   Signed: Burtis Junes, RN, ANP-C 05/12/2014 4:05 PM  [facility] [address] [place], [place]  [address] Phone: [phone] Fax: [phone]

## 2014-05-12 NOTE — Patient Instructions (Addendum)
We will be checking the following labs today TSH  Cut atenolol in half - 50 mg twice a day  Cut Lasix to 40 mg in the AM and 20 mg in the PM  We will get a Lexiscan stress test  We will place an event monitor for the next 30 days  See Dr. Gwenlyn Found in 5 weeks  No driving  Call the [place] office at [phone] if you have any questions, problems or concerns.

## 2014-05-14 DIAGNOSIS — Z9289 Personal history of other medical treatment: Secondary | ICD-10-CM

## 2014-05-14 HISTORY — DX: Personal history of other medical treatment: Z92.89

## 2014-05-20 ENCOUNTER — Encounter (HOSPITAL_COMMUNITY): Payer: PRIVATE HEALTH INSURANCE

## 2014-05-20 ENCOUNTER — Ambulatory Visit (HOSPITAL_COMMUNITY): Payer: Medicare Other | Attending: Nurse Practitioner | Admitting: Radiology

## 2014-05-20 VITALS — BP 102/78 | Ht 62.0 in | Wt 222.0 lb

## 2014-05-20 DIAGNOSIS — Z794 Long term (current) use of insulin: Secondary | ICD-10-CM | POA: Insufficient documentation

## 2014-05-20 DIAGNOSIS — R079 Chest pain, unspecified: Secondary | ICD-10-CM

## 2014-05-20 DIAGNOSIS — E119 Type 2 diabetes mellitus without complications: Secondary | ICD-10-CM | POA: Diagnosis not present

## 2014-05-20 DIAGNOSIS — R0602 Shortness of breath: Secondary | ICD-10-CM

## 2014-05-20 DIAGNOSIS — I10 Essential (primary) hypertension: Secondary | ICD-10-CM | POA: Diagnosis not present

## 2014-05-20 DIAGNOSIS — R55 Syncope and collapse: Secondary | ICD-10-CM | POA: Diagnosis not present

## 2014-05-20 DIAGNOSIS — R609 Edema, unspecified: Secondary | ICD-10-CM

## 2014-05-20 MED ORDER — REGADENOSON 0.4 MG/5ML IV SOLN
0.4000 mg | Freq: Once | INTRAVENOUS | Status: AC
  Administered 2014-05-20: 0.4 mg via INTRAVENOUS

## 2014-05-20 MED ORDER — AMINOPHYLLINE 25 MG/ML IV SOLN
75.0000 mg | Freq: Two times a day (BID) | INTRAVENOUS | Status: DC | PRN
Start: 2014-05-20 — End: 2014-05-21
  Administered 2014-05-20: 75 mg via INTRAVENOUS

## 2014-05-20 MED ORDER — TECHNETIUM TC 99M SESTAMIBI GENERIC - CARDIOLITE
30.0000 | Freq: Once | INTRAVENOUS | Status: AC | PRN
  Administered 2014-05-20: 30 via INTRAVENOUS

## 2014-05-20 NOTE — Progress Notes (Signed)
[facility] [address] [place], [place] [address] [phone]    Cardiology Nuclear Med Study  Margaret Huerta is a 69 y.o. female     MRN : [id number]     DOB: 13-Sep-1945  Procedure Date: 05/20/2014  Nuclear Med Background Indication for Stress Test:  Evaluation for Ischemia History:  2013 MPI: EF: NL Cardiac Risk Factors: Hypertension and IDDM Type 2  Symptoms:  Syncope   Nuclear Pre-Procedure Caffeine/Decaff Intake:  None NPO After: 7:00pm   Lungs:  clear O2 Sat: 97% on room air. IV 0.9% NS with Angio Cath:  22g  IV Site: L Antecubital  IV Started by:  Crissie Figures, RN  Chest Size (in):  40 Cup Size: DD  Height: 5\' 2"  (1.575 m)  Weight:  222 lb (100.699 kg)  BMI:  Body mass index is 40.59 kg/(m^2). Tech Comments: Aminophylline 150 mg IV given for continued symptoms. All were resolved before leaving. Bebe Liter EMTP    Nuclear Med Study 1 or 2 day study: 2 day  Stress Test Type:  Carlton Adam  Reading MD: N/A  Order Authorizing Provider:  Quay Burow, MD  Resting Radionuclide: Technetium 75m Sestamibi  Resting Radionuclide Dose: 33.0 mCi on 05/21/2014  Stress Radionuclide:  Technetium 83m Sestamibi  Stress Radionuclide Dose: 33.0 mCi on 05/20/2014          Stress Protocol Rest HR: 58 Stress HR: 69  Rest BP: 102/78 Stress BP: 122/66  Exercise Time (min): n/a METS: n/a   Predicted Max HR: 154 bpm % Max HR: 44.81 bpm Rate Pressure Product: 8418   Dose of Adenosine (mg):  n/a Dose of Lexiscan: 0.4 mg  Dose of Atropine (mg): n/a Dose of Dobutamine: n/a mcg/kg/min (at max HR)  Stress Test Technologist: Perrin Maltese, EMT-P  Nuclear Technologist:  Earl Many, CNMT     Rest Procedure:  Myocardial perfusion imaging was performed at rest 45 minutes following the intravenous administration of Technetium 33m Sestamibi. Rest ECG: Sinus bradycardia; no ST changes.  Stress Procedure:  The patient received IV Lexiscan 0.4 mg over 15-seconds.   Technetium 83m Sestamibi injected at 30-seconds. This patient had sob and chest tightness with the Lexiscan injection. Quantitative spect images were obtained after a 45 minute delay. Stress ECG: No significant ST segment change suggestive of ischemia.  QPS Raw Data Images:  Acquisition technically good; normal left ventricular size. Stress Images:  There is decreased uptake in the anterior lateral wall, inferior wall and apex. Rest Images:  There is decreased uptake in the apex. Subtraction (SDS):  These findings are consistent with ischemia. Transient Ischemic Dilatation (Normal <1.22):  0.69 Lung/Heart Ratio (Normal <0.45):  0.30  Quantitative Gated Spect Images QGS EDV:  60 ml QGS ESV:  19 ml  Impression Exercise Capacity:  Lexiscan with no exercise. BP Response:  Normal blood pressure response. Clinical Symptoms:  There is chest tightness and dyspnea. ECG Impression:  No significant ST segment change suggestive of ischemia. Comparison with Prior Nuclear Study: No previous nuclear study performed  Overall Impression:  High risk stress nuclear study with large, severe, reversible defects in the anterior lateral wall, inferior wall and apex; findings suggest multivessel disease.  LV Ejection Fraction: 68%.  LV Wall Motion:  NL LV Function; NL Wall Motion  Kirk Ruths

## 2014-05-21 ENCOUNTER — Ambulatory Visit (HOSPITAL_COMMUNITY): Payer: Medicare Other | Attending: Nurse Practitioner

## 2014-05-21 MED ORDER — TECHNETIUM TC 99M SESTAMIBI GENERIC - CARDIOLITE
33.0000 | Freq: Once | INTRAVENOUS | Status: AC | PRN
  Administered 2014-05-21: 33 via INTRAVENOUS

## 2014-05-24 ENCOUNTER — Ambulatory Visit (INDEPENDENT_AMBULATORY_CARE_PROVIDER_SITE_OTHER): Payer: Medicare Other | Admitting: Cardiology

## 2014-05-24 ENCOUNTER — Encounter: Payer: Self-pay | Admitting: Physician Assistant

## 2014-05-24 VITALS — BP 100/56 | HR 53 | Ht 62.0 in | Wt 221.4 lb

## 2014-05-24 DIAGNOSIS — N289 Disorder of kidney and ureter, unspecified: Secondary | ICD-10-CM

## 2014-05-24 DIAGNOSIS — R9430 Abnormal result of cardiovascular function study, unspecified: Secondary | ICD-10-CM

## 2014-05-24 DIAGNOSIS — Z01812 Encounter for preprocedural laboratory examination: Secondary | ICD-10-CM | POA: Diagnosis not present

## 2014-05-24 LAB — BASIC METABOLIC PANEL
BUN: 31 mg/dL — ABNORMAL HIGH (ref 6–23)
CALCIUM: 10 mg/dL (ref 8.4–10.5)
CO2: 29 meq/L (ref 19–32)
Chloride: 103 mEq/L (ref 96–112)
Creatinine, Ser: 1.72 mg/dL — ABNORMAL HIGH (ref 0.40–1.20)
GFR: 31.46 mL/min — AB (ref >60.00)
Glucose, Bld: 140 mg/dL — ABNORMAL HIGH (ref 70–99)
Potassium: 4.5 mEq/L (ref 3.5–5.1)
SODIUM: 137 meq/L (ref 135–145)

## 2014-05-24 LAB — CBC WITH DIFFERENTIAL/PLATELET
Basophils Absolute: 0 10*3/uL (ref 0.0–0.1)
Basophils Relative: 0.7 % (ref 0.0–3.0)
EOS PCT: 3.2 % (ref 0.0–5.0)
Eosinophils Absolute: 0.2 10*3/uL (ref 0.0–0.7)
HCT: 38 % (ref 36.0–46.0)
Hemoglobin: 11.9 g/dL — ABNORMAL LOW (ref 12.0–15.0)
Lymphocytes Relative: 32 % (ref 12.0–46.0)
Lymphs Abs: 2.2 10*3/uL (ref 0.7–4.0)
MCHC: 31.3 g/dL (ref 30.0–36.0)
MCV: 83 fl (ref 78.0–100.0)
MONO ABS: 0.5 10*3/uL (ref 0.1–1.0)
MONOS PCT: 7.5 % (ref 3.0–12.0)
NEUTROS PCT: 56.6 % (ref 43.0–77.0)
Neutro Abs: 3.9 10*3/uL (ref 1.4–7.7)
Platelets: 241 10*3/uL (ref 150.0–400.0)
RBC: 4.57 Mil/uL (ref 3.87–5.11)
RDW: 17.7 % — ABNORMAL HIGH (ref 11.5–15.5)
WBC: 6.8 10*3/uL (ref 4.0–10.5)

## 2014-05-24 MED ORDER — NITROGLYCERIN 0.4 MG SL SUBL: 0.4000 mg | SUBLINGUAL_TABLET | SUBLINGUAL | Status: DC | PRN

## 2014-05-24 NOTE — Patient Instructions (Addendum)
Your physician has recommended you make the following change in your medication:   Nitroglycerin 0.4 mg sublingual tablet take as needed for chest pain. Take one tablet wait 5 minutes if chest pain is tell there, take one tablet and wait 5 minutes, you can take up to 3 tablets if you still have chest pain call 911.  Your physician recommends that you have lab work today BMET STAT and CBC  Your physician has requested that you have a cardiac catheterization. Cardiac catheterization is used to diagnose and/or treat various heart conditions. Doctors may recommend this procedure for a number of different reasons. The most common reason is to evaluate chest pain. Chest pain can be a symptom of coronary artery disease (CAD), and cardiac catheterization can show whether plaque is narrowing or blocking your heart's arteries. This procedure is also used to evaluate the valves, as well as measure the blood flow and oxygen levels in different parts of your heart. For further information please visit [email or web address]. Please follow instruction sheet, as given.

## 2014-05-24 NOTE — Progress Notes (Signed)
05/24/2014 Margaret Huerta   1945-10-11  [id number]  Primary Physician: [name], MD Primary Cardiologist: Dr. Gwenlyn Found  HPI:  Margaret Huerta is a 69 y.o. Female, followed by Dr. Gwenlyn Found, who presents to clinic today to review her recent NST results. She is moderately overweight, married Caucasian female, mother of 2 living children (one died at age 56 from a gunshot wound), and grandmother to 2 grandchildren. She has a history of hypertension, hyperlipidemia, type 2 diabetes, and obstructive sleep apnea (noncompliant with CPAP) with a strong family history for heart disease with her father dying of an MI at age 89, and a sister who has had 2 myocardial infractions. She has had fibromyalgia as well as right nephrectomy back in 2003 for renal cell carcinoma. She has had bariatric surgery performed at [facility] in February of 2015 and a remote Nissen fundoplication back in [id number]. She was catheterized by Dr. Einar Gip in 2003 and was found to have normal coronary arteries and normal LV function. A Myoview stress test performed in March 2013 was nonischemic.  She was recently seen in Flex clinic by Truitt Merle, NP, on 05/12/14 for syncope. Cardiac event monitor and NST were both ordered. NST was interpreted as a high risk stress nuclear study with large, severe, reversible defects in the anterior lateral wall, inferior wall and apex; findings suggest multivessel disease. LV Ejection Fraction was normal at 68%.  She presents to clinic today for f/u. Since her last OV, she denies any recurrent syncope/ near syncope. She does note frequent resting SSCP but notes that episodes are very brief, lasting less than 30 sec at a time. She reports full medication compliance. She is currently on a BB, ARB, statin, long acting nitrate and daily ASA. She also takes lasix for LEE. She denies any CP at today's visit. BP is 100/56. Pulse rate is 53.     Current Outpatient Prescriptions  Medication Sig Dispense Refill  .  allopurinol (ZYLOPRIM) 300 MG tablet Take 300 mg by mouth daily.      Marland Kitchen atenolol (TENORMIN) 100 MG tablet Take 50 mg by mouth 2 (two) times daily.    . cholecalciferol (VITAMIN D) 1000 UNITS tablet Take 1,000 Units by mouth daily.    . cyclobenzaprine (FLEXERIL) 10 MG tablet Take 10 mg by mouth 3 (three) times daily as needed.      . DULoxetine (CYMBALTA) 60 MG capsule Take 60 mg by mouth daily.      . furosemide (LASIX) 40 MG tablet Take 1 tablet (40 mg total) by mouth as directed. Take 40 mg in the AM and 20 mg in the PM    . gabapentin (NEURONTIN) 100 MG capsule Take 100 mg by mouth 3 (three) times daily.    . insulin aspart (NOVOLOG) 100 UNIT/ML injection Inject 5 Units into the skin 3 (three) times daily before meals.    . insulin glargine (LANTUS) 100 UNIT/ML injection Inject 50 Units into the skin 2 (two) times daily.     . isosorbide mononitrate (IMDUR) 60 MG 24 hr tablet TAKE ONE TABLET BY MOUTH ONCE DAILY 30 tablet 11  . metFORMIN (GLUCOPHAGE-XR) 500 MG 24 hr tablet Take 1,000 mg by mouth daily with breakfast.     . Multiple Vitamin (MULTIVITAMIN) capsule Take 1 capsule by mouth daily.      . rosuvastatin (CRESTOR) 20 MG tablet Take 20 mg by mouth daily.      Marland Kitchen telmisartan (MICARDIS) 80 MG tablet Take 80 mg by mouth  daily.      . verapamil (CALAN) 120 MG tablet Take 120 mg by mouth once.    . vitamin B-12 (CYANOCOBALAMIN) 1000 MCG tablet Take 1,000 mcg by mouth daily.       No current facility-administered medications for this visit.    Allergies  Allergen Reactions  . Demerol [Meperidine] Shortness Of Breath    Stop breathing  . Hydrocodone-Acetaminophen Shortness Of Breath    REACTION: stop breathing  . Meperidine Hcl Shortness Of Breath    REACTION: stop breathing  . Vicodin [Hydrocodone-Acetaminophen] Shortness Of Breath    Stop breathing  . Codeine     REACTION: hallucination, confused  . Fentanyl   . Hydromorphone Hcl     REACTION: itching, rash, nausea, vomiting  .  Morphine     REACTION: vomiting  . Naproxen     REACTION: stomach lesion  . Dilaudid [Hydromorphone Hcl] Itching, Nausea And Vomiting and Rash    History   Social History  . Marital Status: Married    Spouse Name: N/A  . Number of Children: N/A  . Years of Education: N/A   Occupational History  . Housewife    Social History Main Topics  . Smoking status: Never Smoker   . Smokeless tobacco: Never Used  . Alcohol Use: No  . Drug Use: No  . Sexual Activity: Yes    Birth Control/ Protection: Surgical   Other Topics Concern  . Not on file   Social History Narrative     Review of Systems: General: negative for chills, fever, night sweats or weight changes.  Cardiovascular: negative for chest pain, dyspnea on exertion, edema, orthopnea, palpitations, paroxysmal nocturnal dyspnea or shortness of breath Dermatological: negative for rash Respiratory: negative for cough or wheezing Urologic: negative for hematuria Abdominal: negative for nausea, vomiting, diarrhea, bright red blood per rectum, melena, or hematemesis Neurologic: negative for visual changes, syncope, or dizziness All other systems reviewed and are otherwise negative except as noted above.    Blood pressure 100/56, pulse 53, height 5\' 2"  (1.575 m), weight 221 lb 6.4 oz (100.426 kg), SpO2 95 %.  General appearance: alert, cooperative and no distress Neck: no carotid bruit and no JVD Lungs: clear to auscultation bilaterally Heart: regular rate and rhythm, S1, S2 normal, no murmur, click, rub or gallop Extremities: no LEE Pulses: 2+ and symmetric Skin: warm and dry Neurologic: Grossly normal  EKG not ordered  ASSESSMENT AND PLAN:   1. Abnormal NST: High risk findings suggestive of ischemia. Also with recent h/o syncope and intermittent CP. Will arrange for [facility] +/- PCI with Dr. Dr. Gwenlyn Found 05/27/14. Continue daily ASA, BB, statin and long acting nitrate. Will also prescribe PRN SL NTG. We reviewed proper use of  SL NTG and when to call 911.   2. CKD: h/o rt renal cell carcinoma s/p right nephrectomy. Last Scr 05/04/14 was 1.7. In anticipation for LHC to assess for ischemia, I have instructed patient to hold diuretic, ARB and metformin until after cath. We will recheck BMP today. Will arrange for her to be admitted the day prior for pre-cath hydration (has normal LV systolic function).   3. Syncope: pt denies any further recurrence since last OV. Cardiac event monitor findings pending. May also be subsequent to possible coronary ischemia given recent abnormal NST. Plan for [facility] 05/27/14. No driving.    PLAN  Plan for admission to [facility] on 05/26/14 for IV hydration followed by LHC with Dr. Gwenlyn Found on 05/27/14.   Deshon Hsiao, BRITTAINYPA-C  05/24/2014 11:15 AM

## 2014-05-26 ENCOUNTER — Observation Stay (HOSPITAL_COMMUNITY)
Admission: AD | Admit: 2014-05-26 | Discharge: 2014-05-27 | Disposition: A | Discharge status: Home / Self Care | Payer: Medicare Other | Source: Ambulatory Visit | Attending: Cardiovascular Disease | Admitting: Cardiovascular Disease

## 2014-05-26 ENCOUNTER — Encounter (HOSPITAL_COMMUNITY): Payer: Self-pay | Admitting: General Practice

## 2014-05-26 DIAGNOSIS — R0789 Other chest pain: Secondary | ICD-10-CM | POA: Insufficient documentation

## 2014-05-26 DIAGNOSIS — Z794 Long term (current) use of insulin: Secondary | ICD-10-CM | POA: Insufficient documentation

## 2014-05-26 DIAGNOSIS — E215 Disorder of parathyroid gland, unspecified: Secondary | ICD-10-CM | POA: Insufficient documentation

## 2014-05-26 DIAGNOSIS — Z9884 Bariatric surgery status: Secondary | ICD-10-CM | POA: Insufficient documentation

## 2014-05-26 DIAGNOSIS — Z90710 Acquired absence of both cervix and uterus: Secondary | ICD-10-CM | POA: Diagnosis not present

## 2014-05-26 DIAGNOSIS — Z9049 Acquired absence of other specified parts of digestive tract: Secondary | ICD-10-CM | POA: Diagnosis not present

## 2014-05-26 DIAGNOSIS — N183 Chronic kidney disease, stage 3 (moderate): Secondary | ICD-10-CM

## 2014-05-26 DIAGNOSIS — Z0389 Encounter for observation for other suspected diseases and conditions ruled out: Secondary | ICD-10-CM

## 2014-05-26 DIAGNOSIS — Z7982 Long term (current) use of aspirin: Secondary | ICD-10-CM | POA: Diagnosis not present

## 2014-05-26 DIAGNOSIS — G4733 Obstructive sleep apnea (adult) (pediatric): Secondary | ICD-10-CM | POA: Insufficient documentation

## 2014-05-26 DIAGNOSIS — Z6841 Body Mass Index (BMI) 40.0 and over, adult: Secondary | ICD-10-CM | POA: Diagnosis not present

## 2014-05-26 DIAGNOSIS — M797 Fibromyalgia: Secondary | ICD-10-CM | POA: Insufficient documentation

## 2014-05-26 DIAGNOSIS — Z8541 Personal history of malignant neoplasm of cervix uteri: Secondary | ICD-10-CM | POA: Insufficient documentation

## 2014-05-26 DIAGNOSIS — Z85528 Personal history of other malignant neoplasm of kidney: Secondary | ICD-10-CM

## 2014-05-26 DIAGNOSIS — R9439 Abnormal result of other cardiovascular function study: Secondary | ICD-10-CM | POA: Diagnosis not present

## 2014-05-26 DIAGNOSIS — Z79899 Other long term (current) drug therapy: Secondary | ICD-10-CM | POA: Insufficient documentation

## 2014-05-26 DIAGNOSIS — R609 Edema, unspecified: Secondary | ICD-10-CM

## 2014-05-26 DIAGNOSIS — I2510 Atherosclerotic heart disease of native coronary artery without angina pectoris: Secondary | ICD-10-CM | POA: Diagnosis not present

## 2014-05-26 DIAGNOSIS — N189 Chronic kidney disease, unspecified: Secondary | ICD-10-CM | POA: Insufficient documentation

## 2014-05-26 DIAGNOSIS — I10 Essential (primary) hypertension: Secondary | ICD-10-CM | POA: Diagnosis present

## 2014-05-26 DIAGNOSIS — M810 Age-related osteoporosis without current pathological fracture: Secondary | ICD-10-CM | POA: Diagnosis not present

## 2014-05-26 DIAGNOSIS — IMO0001 Reserved for inherently not codable concepts without codable children: Secondary | ICD-10-CM

## 2014-05-26 DIAGNOSIS — E1140 Type 2 diabetes mellitus with diabetic neuropathy, unspecified: Secondary | ICD-10-CM | POA: Diagnosis not present

## 2014-05-26 DIAGNOSIS — E663 Overweight: Secondary | ICD-10-CM | POA: Insufficient documentation

## 2014-05-26 DIAGNOSIS — Z905 Acquired absence of kidney: Secondary | ICD-10-CM | POA: Insufficient documentation

## 2014-05-26 DIAGNOSIS — I129 Hypertensive chronic kidney disease with stage 1 through stage 4 chronic kidney disease, or unspecified chronic kidney disease: Secondary | ICD-10-CM | POA: Insufficient documentation

## 2014-05-26 DIAGNOSIS — Z8249 Family history of ischemic heart disease and other diseases of the circulatory system: Secondary | ICD-10-CM

## 2014-05-26 DIAGNOSIS — E785 Hyperlipidemia, unspecified: Secondary | ICD-10-CM | POA: Insufficient documentation

## 2014-05-26 DIAGNOSIS — F329 Major depressive disorder, single episode, unspecified: Secondary | ICD-10-CM | POA: Insufficient documentation

## 2014-05-26 HISTORY — DX: Gastro-esophageal reflux disease without esophagitis: K21.9

## 2014-05-26 HISTORY — DX: Type 2 diabetes mellitus without complications: E11.9

## 2014-05-26 HISTORY — DX: Personal history of other medical treatment: Z92.89

## 2014-05-26 HISTORY — DX: Typhus fever, unspecified: A75.9

## 2014-05-26 HISTORY — DX: Personal history of other diseases of the digestive system: Z87.19

## 2014-05-26 HISTORY — DX: Unspecified chronic bronchitis: J42

## 2014-05-26 HISTORY — DX: Low back pain: M54.5

## 2014-05-26 HISTORY — DX: Disorder of kidney and ureter, unspecified: N28.9

## 2014-05-26 HISTORY — DX: Migraine, unspecified, not intractable, without status migrainosus: G43.909

## 2014-05-26 HISTORY — DX: Gout, unspecified: M10.9

## 2014-05-26 HISTORY — DX: Disorder of parathyroid gland, unspecified: E21.5

## 2014-05-26 HISTORY — DX: Anemia, unspecified: D64.9

## 2014-05-26 HISTORY — DX: Simple febrile convulsions: R56.00

## 2014-05-26 HISTORY — DX: Sleep apnea, unspecified: G47.30

## 2014-05-26 HISTORY — DX: Low back pain, unspecified: M54.50

## 2014-05-26 HISTORY — DX: Pneumonia, unspecified organism: J18.9

## 2014-05-26 HISTORY — DX: Anxiety disorder, unspecified: F41.9

## 2014-05-26 HISTORY — DX: Other chronic pain: G89.29

## 2014-05-26 LAB — BASIC METABOLIC PANEL
ANION GAP: 7 (ref 5–15)
BUN: 25 mg/dL — ABNORMAL HIGH (ref 6–23)
CO2: 26 mmol/L (ref 19–32)
Calcium: 9.8 mg/dL (ref 8.4–10.5)
Chloride: 108 mmol/L (ref 96–112)
Creatinine, Ser: 1.52 mg/dL — ABNORMAL HIGH (ref 0.50–1.10)
GFR calc Af Amer: 40 mL/min — ABNORMAL LOW (ref >90)
GFR calc non Af Amer: 35 mL/min — ABNORMAL LOW (ref >90)
Glucose, Bld: 75 mg/dL (ref 70–99)
Potassium: 4.5 mmol/L (ref 3.5–5.1)
Sodium: 141 mmol/L (ref 135–145)

## 2014-05-26 LAB — GLUCOSE, CAPILLARY: Glucose-Capillary: 118 mg/dL — ABNORMAL HIGH (ref 70–99)

## 2014-05-26 MED ORDER — SODIUM CHLORIDE 0.9 % IV SOLN: 250.0000 mL | INTRAVENOUS | Status: DC | PRN

## 2014-05-26 MED ORDER — VERAPAMIL HCL 120 MG PO TABS
120.0000 mg | ORAL_TABLET | Freq: Every day | ORAL | Status: DC
  Administered 2014-05-27: 120 mg via ORAL
  Filled 2014-05-26 (×2): qty 1

## 2014-05-26 MED ORDER — ROSUVASTATIN CALCIUM 20 MG PO TABS
20.0000 mg | ORAL_TABLET | Freq: Every day | ORAL | Status: DC
  Administered 2014-05-27: 20 mg via ORAL
  Filled 2014-05-26 (×2): qty 1

## 2014-05-26 MED ORDER — VITAMIN D3 25 MCG (1000 UNIT) PO TABS
1000.0000 [IU] | ORAL_TABLET | Freq: Every day | ORAL | Status: DC
  Administered 2014-05-27: 1000 [IU] via ORAL
  Filled 2014-05-26: qty 1

## 2014-05-26 MED ORDER — PNEUMOCOCCAL VAC POLYVALENT 25 MCG/0.5ML IJ INJ
0.5000 mL | INJECTION | INTRAMUSCULAR | Status: DC
  Filled 2014-05-26: qty 0.5

## 2014-05-26 MED ORDER — ATENOLOL 50 MG PO TABS
50.0000 mg | ORAL_TABLET | Freq: Two times a day (BID) | ORAL | Status: DC
  Administered 2014-05-26 – 2014-05-27 (×2): 50 mg via ORAL
  Filled 2014-05-26 (×4): qty 1

## 2014-05-26 MED ORDER — ASPIRIN EC 81 MG PO TBEC
81.0000 mg | DELAYED_RELEASE_TABLET | Freq: Every day | ORAL | Status: DC
Start: 2014-05-26 — End: 2014-05-27
  Administered 2014-05-27: 81 mg via ORAL
  Filled 2014-05-26 (×2): qty 1

## 2014-05-26 MED ORDER — DULOXETINE HCL 60 MG PO CPEP
60.0000 mg | ORAL_CAPSULE | Freq: Every day | ORAL | Status: DC
  Administered 2014-05-27: 60 mg via ORAL
  Filled 2014-05-26 (×2): qty 1

## 2014-05-26 MED ORDER — SODIUM CHLORIDE 0.9 % IV SOLN
INTRAVENOUS | Status: DC
  Administered 2014-05-26: 15:00:00 via INTRAVENOUS

## 2014-05-26 MED ORDER — ASPIRIN 81 MG PO CHEW
81.0000 mg | CHEWABLE_TABLET | ORAL | Status: AC
  Administered 2014-05-27: 81 mg via ORAL
  Filled 2014-05-26: qty 1

## 2014-05-26 MED ORDER — SODIUM CHLORIDE 0.9 % IJ SOLN: 3.0000 mL | INTRAMUSCULAR | Status: DC | PRN

## 2014-05-26 MED ORDER — ISOSORBIDE MONONITRATE ER 60 MG PO TB24
60.0000 mg | ORAL_TABLET | Freq: Every day | ORAL | Status: DC
  Administered 2014-05-27: 60 mg via ORAL
  Filled 2014-05-26 (×2): qty 1

## 2014-05-26 MED ORDER — DOCUSATE SODIUM 100 MG PO CAPS
100.0000 mg | ORAL_CAPSULE | Freq: Every day | ORAL | Status: DC
  Administered 2014-05-27: 100 mg via ORAL
  Filled 2014-05-26: qty 1

## 2014-05-26 MED ORDER — ENSURE ENLIVE PO LIQD
237.0000 mL | Freq: Two times a day (BID) | ORAL | Status: DC
  Administered 2014-05-27: 237 mL via ORAL

## 2014-05-26 MED ORDER — GABAPENTIN 100 MG PO CAPS
100.0000 mg | ORAL_CAPSULE | Freq: Three times a day (TID) | ORAL | Status: DC
  Administered 2014-05-26 – 2014-05-27 (×3): 100 mg via ORAL
  Filled 2014-05-26 (×5): qty 1

## 2014-05-26 MED ORDER — NITROGLYCERIN 0.4 MG SL SUBL: 0.4000 mg | SUBLINGUAL_TABLET | SUBLINGUAL | Status: DC | PRN

## 2014-05-26 MED ORDER — ALLOPURINOL 300 MG PO TABS
300.0000 mg | ORAL_TABLET | Freq: Every day | ORAL | Status: DC
  Administered 2014-05-27: 300 mg via ORAL
  Filled 2014-05-26 (×2): qty 1

## 2014-05-26 MED ORDER — SODIUM CHLORIDE 0.9 % IJ SOLN
3.0000 mL | Freq: Two times a day (BID) | INTRAMUSCULAR | Status: DC
  Administered 2014-05-26: 3 mL via INTRAVENOUS

## 2014-05-26 NOTE — H&P (Signed)
Patient ID: Margaret Huerta MRN: [id number], DOB/AGE: [birth year]   Admit date: 05/26/2014  Reason for Admission: Pre-cath Hydration/ Abnormal NST  Primary Physician: [name], MD Primary Cardiologist: Dr. Gwenlyn Found  Pt. Profile:  69 y/o female with history of hypertension, hyperlipidemia, type 2 diabetes, and obstructive sleep apnea (noncompliant with CPAP) with a strong family history for heart disease, remote renal cell carcinoma s/p right nephrectomy, chronic renal insufficieny (baseline Scr 1.7), admitted for pre-cath hydration with plans for LHC due to recent high risk NST and recent h/o syncope and chest pain.   Problem List  Past Medical History  Diagnosis Date  . Diabetes mellitus   . Hyperlipidemia   . Hypertension   . Angina pectoris   . Cervical cancer   . Thyroid disease     parathyroid  . Neuropathy   . Osteoporosis   . Fibromyalgia   . Depression   . Renal cancer   . Coronary artery disease   . Hx of cardiovascular stress test     Lexiscan Myoview 4/16:  Anterior lateral, inferior and apical ischemia, EF 68%; High Risk    Past Surgical History  Procedure Laterality Date  . Nephrectomy      right  . Cholecystectomy    . Back surgery    . Partial hysterectomy    . Appendectomy    . Tmj arthroplasty    . Dilation and curettage of uterus    . Tonsillectomy    . Abdominal hysterectomy    . Incontinence surgery    . Kidney Racca surgery    . Mouth surgery    . Upper gi endoscopy    . Knee arthroscopy    . Gastric bypass  2012  . Cardiac catheterization    . Eye surgery       Allergies  Allergies  Allergen Reactions  . Demerol [Meperidine] Shortness Of Breath    Stop breathing  . Hydrocodone-Acetaminophen Shortness Of Breath    REACTION: stop breathing  . Meperidine Hcl Shortness Of Breath    REACTION: stop breathing  . Vicodin [Hydrocodone-Acetaminophen] Shortness Of Breath    Stop breathing  . Codeine     REACTION: hallucination, confused    . Fentanyl   . Hydromorphone Hcl     REACTION: itching, rash, nausea, vomiting  . Morphine     REACTION: vomiting  . Naproxen     REACTION: stomach lesion  . Dilaudid [Hydromorphone Hcl] Itching, Nausea And Vomiting and Rash    HPI  Margaret Huerta is a 69 y.o. Female, followed by Dr. Gwenlyn Found. She is moderately overweight, married Caucasian female, mother of 2 living children (one died at age 51 from a gunshot wound), and grandmother to 2 grandchildren. She has a history of hypertension, hyperlipidemia, type 2 diabetes, and obstructive sleep apnea (noncompliant with CPAP) with a strong family history for heart disease with her father dying of an MI at age 24, and a sister who has had 2 myocardial infractions. She has had fibromyalgia as well as right nephrectomy back in 2003 for renal cell carcinoma. She has had bariatric surgery performed at [facility] in February of 2015 and a remote Nissen fundoplication back in [id number]. She was catheterized by Dr. Einar Gip in 2003 and was found to have normal coronary arteries and normal LV function. A Myoview stress test performed in March 2013 was nonischemic.  She was recently seen in [facility] by Truitt Merle, NP, on 05/12/14 for syncope. Cardiac event monitor and  NST were both ordered. NST was interpreted as a high risk stress nuclear study with large, severe, reversible defects in the anterior lateral wall, inferior wall and apex; findings suggest multivessel disease. LV Ejection Fraction was normal at 68%.  She was seen back in clinic on 05/24/14 for follow-up. She denied any further syncope but noted continued symptoms of intermitted resting substernal chest pressure. She was CP free during the visit. Labs were checked and Scr was 1.72. She has now been admitted for pre-cath hydration with plans for LHC with Dr. Gwenlyn Found on 05/27/14.   Home Medications  Prior to Admission medications   Medication Sig Start Date End Date Taking? Authorizing Provider  allopurinol  (ZYLOPRIM) 300 MG tablet Take 300 mg by mouth daily.     Yes Historical Provider, MD  aspirin 81 MG tablet Take 81 mg by mouth daily.   Yes Historical Provider, MD  atenolol (TENORMIN) 100 MG tablet Take 50 mg by mouth 2 (two) times daily.   Yes Historical Provider, MD  cholecalciferol (VITAMIN D) 1000 UNITS tablet Take 1,000 Units by mouth daily.   Yes Historical Provider, MD  diphenhydrAMINE (BENADRYL) 25 mg capsule Take 25 mg by mouth 2 (two) times daily as needed.   Yes Historical Provider, MD  docusate sodium (COLACE) 100 MG capsule Take 100 mg by mouth daily.   Yes Historical Provider, MD  DULoxetine (CYMBALTA) 60 MG capsule Take 60 mg by mouth daily.     Yes Historical Provider, MD  furosemide (LASIX) 40 MG tablet Take 1 tablet (40 mg total) by mouth as directed. Take 40 mg in the AM and 20 mg in the PM Patient taking differently: Take 20-40 mg by mouth as directed. Take 40 mg in the AM and 20 mg in the PM 05/12/14  Yes Burtis Junes, NP  gabapentin (NEURONTIN) 100 MG capsule Take 100 mg by mouth 3 (three) times daily. 04/26/14  Yes Historical Provider, MD  insulin aspart (NOVOLOG) 100 UNIT/ML injection Inject 5-10 Units into the skin 3 (three) times daily before meals.    Yes Historical Provider, MD  insulin glargine (LANTUS) 100 UNIT/ML injection Inject 50 Units into the skin at bedtime.    Yes Historical Provider, MD  isosorbide mononitrate (IMDUR) 60 MG 24 hr tablet TAKE ONE TABLET BY MOUTH ONCE DAILY   Yes Lorretta Harp, MD  metFORMIN (GLUCOPHAGE-XR) 500 MG 24 hr tablet Take 1,000 mg by mouth daily with breakfast.    Yes Historical Provider, MD  Multiple Vitamin (MULTIVITAMIN) capsule Take 1 capsule by mouth daily.     Yes Historical Provider, MD  nitroGLYCERIN (NITROSTAT) 0.4 MG SL tablet Place 1 tablet (0.4 mg total) under the tongue every 5 (five) minutes as needed for chest pain. 05/24/14  Yes Brittainy Erie Noe, PA-C  rosuvastatin (CRESTOR) 20 MG tablet Take 20 mg by mouth daily.      Yes Historical Provider, MD  telmisartan (MICARDIS) 80 MG tablet Take 80 mg by mouth daily.     Yes Historical Provider, MD  verapamil (CALAN) 120 MG tablet Take 120 mg by mouth daily.    Yes Historical Provider, MD  vitamin B-12 (CYANOCOBALAMIN) 1000 MCG tablet Take 1,000 mcg by mouth daily.     Yes Historical Provider, MD    Family History  Family History  Problem Relation Age of Onset  . Emphysema Father   . Heart disease Father   . Allergies      whole family per pt  . Clotting  disorder Mother   . Diabetes Mother   . Kidney disease Mother   . Lung cancer Maternal Uncle     Social History  History   Social History  . Marital Status: Married    Spouse Name: N/A  . Number of Children: N/A  . Years of Education: N/A   Occupational History  . Housewife    Social History Main Topics  . Smoking status: Never Smoker   . Smokeless tobacco: Never Used  . Alcohol Use: No  . Drug Use: No  . Sexual Activity: Yes    Birth Control/ Protection: Surgical   Other Topics Concern  . Not on file   Social History Narrative     Review of Systems General:  No chills, fever, night sweats or weight changes.  Cardiovascular:  No chest pain, dyspnea on exertion, edema, orthopnea, palpitations, paroxysmal nocturnal dyspnea. Dermatological: No rash, lesions/masses Respiratory: No cough, dyspnea Urologic: No hematuria, dysuria Abdominal:   No nausea, vomiting, diarrhea, bright red blood per rectum, melena, or hematemesis Neurologic:  No visual changes, wkns, changes in mental status. All other systems reviewed and are otherwise negative except as noted above.  Physical Exam  Blood pressure 110/84, pulse 59, temperature 98 F (36.7 C), temperature source Oral, resp. rate 18, height 5\' 2"  (1.575 m), weight 223 lb 12.8 oz (101.515 kg), SpO2 98 %.  General: Pleasant, NAD Psych: Normal affect. Neuro: Alert and oriented X 3. Moves all extremities spontaneously. HEENT: Normal  Neck:  Supple without bruits or JVD. Lungs:  Resp regular and unlabored, CTA. Heart: RRR no s3, s4, or murmurs. Abdomen: Soft, non-tender, non-distended, BS + x 4.  Extremities: No clubbing, cyanosis or edema. DP/PT/Radials 2+ and equal bilaterally.  Labs  Troponin (Point of Care Test) No results for input(s): TROPIPOC in the last 72 hours. No results for input(s): CKTOTAL, CKMB, TROPONINI in the last 72 hours. Lab Results  Component Value Date   WBC 6.8 05/24/2014   HGB 11.9* 05/24/2014   HCT 38.0 05/24/2014   MCV 83.0 05/24/2014   PLT 241.0 05/24/2014     Recent Labs Lab 05/24/14 1231  NA 137  K 4.5  CL 103  CO2 29  BUN 31*  CREATININE 1.72*  CALCIUM 10.0  GLUCOSE 140*   No results found for: CHOL, HDL, LDLCALC, TRIG No results found for: DDIMER   Radiology/Studies  Dg Clavicle Left  05/04/2014   CLINICAL DATA:  Fall, acute left shoulder/ clavicle pain  EXAM: LEFT CLAVICLE - 2+ VIEWS  COMPARISON:  None.  FINDINGS: There is no evidence of fracture or other focal bone lesions. Soft tissues are unremarkable.  IMPRESSION: Negative.   Electronically Signed   By: Jerilynn Mages.  Shick M.D.   On: 05/04/2014 13:46   Ct Head Wo Contrast  05/04/2014   CLINICAL DATA:  Fall, left face bruising  EXAM: CT HEAD WITHOUT CONTRAST  CT MAXILLOFACIAL WITHOUT CONTRAST  TECHNIQUE: Multidetector CT imaging of the head and maxillofacial structures were performed using the standard protocol without intravenous contrast. Multiplanar CT image reconstructions of the maxillofacial structures were also generated.  COMPARISON:  None.  FINDINGS: CT HEAD FINDINGS  No skull fracture is noted. Paranasal sinuses and mastoid air cells are unremarkable.  No intracranial hemorrhage, mass effect or midline shift. No acute infarction. Mild periventricular white matter decreased attenuation probable due to chronic small vessel ischemic changes. No mass lesion is noted on this unenhanced scan.  CT MAXILLOFACIAL FINDINGS  Mild mucosal  thickening posterior inferior  aspect bilateral maxillary sinus. No nasal bone fracture is noted. No intraorbital hematoma. No zygomatic fracture is noted. There is mild soft tissue swelling and mild subcutaneous stranding left face infraorbital. No facial fluid collection. Metallic dental artifacts are noted mandibular region and maxillary region.  No paranasal sinuses air-fluid levels.  There is right nasal septum deviation. Bilateral semilunar canal is patent. No orbital rim or orbital floor fracture. There is no TMJ dislocation. No mandibular fracture is noted.  Sagittal images shows no maxillary spine fracture. The nasopharyngeal and oropharyngeal airway is patent. Mild degenerative changes C1-C2 articulation.  IMPRESSION: 1. No acute intracranial abnormality. 2. Mild periventricular white matter decreased attenuation probable due to chronic small vessel ischemic changes. 3. No facial fractures are noted. No zygomatic fracture. No orbital rim or orbital floor fracture. No intraorbital hematoma. 4. There is mild soft tissue swelling and subcutaneous stranding left face infraorbital region. No facial fluid collection. 5. Mild mucosal thickening inferior posterior aspect bilateral maxillary sinus. No paranasal sinuses air-fluid levels. 6. Patent oropharyngeal and nasopharyngeal airway. 7. There is right nasal septum deviation.   Electronically Signed   By: Lahoma Crocker M.D.   On: 05/04/2014 13:31   Ct Maxillofacial Wo Cm  05/04/2014   CLINICAL DATA:  Fall, left face bruising  EXAM: CT HEAD WITHOUT CONTRAST  CT MAXILLOFACIAL WITHOUT CONTRAST  TECHNIQUE: Multidetector CT imaging of the head and maxillofacial structures were performed using the standard protocol without intravenous contrast. Multiplanar CT image reconstructions of the maxillofacial structures were also generated.  COMPARISON:  None.  FINDINGS: CT HEAD FINDINGS  No skull fracture is noted. Paranasal sinuses and mastoid air cells are unremarkable.  No  intracranial hemorrhage, mass effect or midline shift. No acute infarction. Mild periventricular white matter decreased attenuation probable due to chronic small vessel ischemic changes. No mass lesion is noted on this unenhanced scan.  CT MAXILLOFACIAL FINDINGS  Mild mucosal thickening posterior inferior aspect bilateral maxillary sinus. No nasal bone fracture is noted. No intraorbital hematoma. No zygomatic fracture is noted. There is mild soft tissue swelling and mild subcutaneous stranding left face infraorbital. No facial fluid collection. Metallic dental artifacts are noted mandibular region and maxillary region.  No paranasal sinuses air-fluid levels.  There is right nasal septum deviation. Bilateral semilunar canal is patent. No orbital rim or orbital floor fracture. There is no TMJ dislocation. No mandibular fracture is noted.  Sagittal images shows no maxillary spine fracture. The nasopharyngeal and oropharyngeal airway is patent. Mild degenerative changes C1-C2 articulation.  IMPRESSION: 1. No acute intracranial abnormality. 2. Mild periventricular white matter decreased attenuation probable due to chronic small vessel ischemic changes. 3. No facial fractures are noted. No zygomatic fracture. No orbital rim or orbital floor fracture. No intraorbital hematoma. 4. There is mild soft tissue swelling and subcutaneous stranding left face infraorbital region. No facial fluid collection. 5. Mild mucosal thickening inferior posterior aspect bilateral maxillary sinus. No paranasal sinuses air-fluid levels. 6. Patent oropharyngeal and nasopharyngeal airway. 7. There is right nasal septum deviation.   Electronically Signed   By: Lahoma Crocker M.D.   On: 05/04/2014 13:31   NST 05/20/14 Impression Exercise Capacity: Lexiscan with no exercise. BP Response: Normal blood pressure response. Clinical Symptoms: There is chest tightness and dyspnea. ECG Impression: No significant ST segment change suggestive of  ischemia. Comparison with Prior Nuclear Study: No previous nuclear study performed  Overall Impression: High risk stress nuclear study with large, severe, reversible defects in the anterior lateral wall, inferior wall  and apex; findings suggest multivessel disease.  LV Ejection Fraction: 68%. LV Wall Motion: NL LV Function; NL Wall Motion    ASSESSMENT AND PLAN Active Problems:   Abnormal stress test   Chronic renal insufficiency   1. High Risk NST: plan for LHC tomorrow to assess for CAD. Continue ASA, statin and BB. Hold ARB for cath.   2. Chronic Renal Insufficiency: has solitary left kidney, s/p right nephrectomy in 2003 due to renal cell carcinoma. Baseline Scr 1.7. Will re-check BMP. Will initiate IVFs for pre-cath hydration. EF on NST was normal. Will hold diuretic, ARB and metformin. F/U BMP in the am. Dr. Gwenlyn Found to limit dye exposure during cath. Will keep for post-cath hydration.    Signed, Lyda Jester, PA-C 05/26/2014, 2:42 PM  Patient seen with PA, agree with the above note.  High risk stress test with history of chest pain.  We are admitting for hydration pre-cath given baseline creatinine 1.7.  Will hold ARB.  Repeat BMET in am, plan cath tomorrow.   Loralie Champagne 05/26/2014 2:56 PM

## 2014-05-27 ENCOUNTER — Encounter (HOSPITAL_COMMUNITY)
Admission: AD | Disposition: A | Discharge status: Home / Self Care | Payer: Self-pay | Source: Ambulatory Visit | Attending: Cardiovascular Disease

## 2014-05-27 ENCOUNTER — Encounter (HOSPITAL_COMMUNITY): Payer: Self-pay | Admitting: Cardiology

## 2014-05-27 DIAGNOSIS — I2510 Atherosclerotic heart disease of native coronary artery without angina pectoris: Secondary | ICD-10-CM | POA: Diagnosis not present

## 2014-05-27 DIAGNOSIS — Z0389 Encounter for observation for other suspected diseases and conditions ruled out: Secondary | ICD-10-CM

## 2014-05-27 DIAGNOSIS — N189 Chronic kidney disease, unspecified: Secondary | ICD-10-CM | POA: Diagnosis not present

## 2014-05-27 DIAGNOSIS — Z8249 Family history of ischemic heart disease and other diseases of the circulatory system: Secondary | ICD-10-CM

## 2014-05-27 DIAGNOSIS — Z85528 Personal history of other malignant neoplasm of kidney: Secondary | ICD-10-CM

## 2014-05-27 DIAGNOSIS — E785 Hyperlipidemia, unspecified: Secondary | ICD-10-CM | POA: Diagnosis not present

## 2014-05-27 DIAGNOSIS — IMO0001 Reserved for inherently not codable concepts without codable children: Secondary | ICD-10-CM

## 2014-05-27 DIAGNOSIS — R0789 Other chest pain: Secondary | ICD-10-CM | POA: Diagnosis not present

## 2014-05-27 DIAGNOSIS — R9439 Abnormal result of other cardiovascular function study: Secondary | ICD-10-CM | POA: Diagnosis not present

## 2014-05-27 DIAGNOSIS — I129 Hypertensive chronic kidney disease with stage 1 through stage 4 chronic kidney disease, or unspecified chronic kidney disease: Secondary | ICD-10-CM | POA: Diagnosis not present

## 2014-05-27 HISTORY — PX: LEFT HEART CATHETERIZATION WITH CORONARY ANGIOGRAM: SHX5451

## 2014-05-27 LAB — BASIC METABOLIC PANEL
ANION GAP: 8 (ref 5–15)
BUN: 21 mg/dL (ref 6–23)
CHLORIDE: 109 mmol/L (ref 96–112)
CO2: 25 mmol/L (ref 19–32)
Calcium: 9.3 mg/dL (ref 8.4–10.5)
Creatinine, Ser: 1.35 mg/dL — ABNORMAL HIGH (ref 0.50–1.10)
GFR calc Af Amer: 46 mL/min — ABNORMAL LOW (ref >90)
GFR, EST NON AFRICAN AMERICAN: 40 mL/min — AB (ref >90)
GLUCOSE: 139 mg/dL — AB (ref 70–99)
Potassium: 4.7 mmol/L (ref 3.5–5.1)
Sodium: 142 mmol/L (ref 135–145)

## 2014-05-27 LAB — PROTIME-INR
INR: 1.21 (ref 0.00–1.49)
Prothrombin Time: 15.4 seconds — ABNORMAL HIGH (ref 11.6–15.2)

## 2014-05-27 LAB — GLUCOSE, CAPILLARY
GLUCOSE-CAPILLARY: 181 mg/dL — AB (ref 70–99)
Glucose-Capillary: 150 mg/dL — ABNORMAL HIGH (ref 70–99)

## 2014-05-27 LAB — CBC
HCT: 34 % — ABNORMAL LOW (ref 36.0–46.0)
HEMOGLOBIN: 10.7 g/dL — AB (ref 12.0–15.0)
MCH: 26.2 pg (ref 26.0–34.0)
MCHC: 31.5 g/dL (ref 30.0–36.0)
MCV: 83.1 fL (ref 78.0–100.0)
PLATELETS: 201 10*3/uL (ref 150–400)
RBC: 4.09 MIL/uL (ref 3.87–5.11)
RDW: 16.1 % — ABNORMAL HIGH (ref 11.5–15.5)
WBC: 4.9 10*3/uL (ref 4.0–10.5)

## 2014-05-27 SURGERY — LEFT HEART CATHETERIZATION WITH CORONARY ANGIOGRAM
Anesthesia: LOCAL

## 2014-05-27 MED ORDER — HEPARIN SODIUM (PORCINE) 1000 UNIT/ML IJ SOLN
INTRAMUSCULAR | Status: AC
  Filled 2014-05-27: qty 1

## 2014-05-27 MED ORDER — ACETAMINOPHEN 325 MG PO TABS: 650.0000 mg | ORAL_TABLET | ORAL | Status: DC | PRN

## 2014-05-27 MED ORDER — ONDANSETRON HCL 4 MG/2ML IJ SOLN: 4.0000 mg | Freq: Four times a day (QID) | INTRAMUSCULAR | Status: DC | PRN

## 2014-05-27 MED ORDER — LIDOCAINE HCL (PF) 1 % IJ SOLN
INTRAMUSCULAR | Status: AC
  Filled 2014-05-27: qty 30

## 2014-05-27 MED ORDER — SODIUM CHLORIDE 0.9 % IV SOLN: INTRAVENOUS | Status: AC

## 2014-05-27 MED ORDER — NITROGLYCERIN 1 MG/10 ML FOR IR/CATH LAB
INTRA_ARTERIAL | Status: AC
  Filled 2014-05-27: qty 10

## 2014-05-27 MED ORDER — METFORMIN HCL ER 500 MG PO TB24: 1000.0000 mg | ORAL_TABLET | Freq: Every day | ORAL | Status: DC

## 2014-05-27 MED ORDER — HEPARIN (PORCINE) IN NACL 2-0.9 UNIT/ML-% IJ SOLN
INTRAMUSCULAR | Status: AC
  Filled 2014-05-27: qty 1000

## 2014-05-27 MED ORDER — ENSURE ENLIVE PO LIQD: 237.0000 mL | Freq: Two times a day (BID) | ORAL | Status: DC

## 2014-05-27 MED ORDER — VERAPAMIL HCL 2.5 MG/ML IV SOLN
INTRAVENOUS | Status: AC
  Filled 2014-05-27: qty 2

## 2014-05-27 MED ORDER — TELMISARTAN 80 MG PO TABS: 80.0000 mg | ORAL_TABLET | Freq: Every day | ORAL | Status: DC

## 2014-05-27 MED ORDER — FUROSEMIDE 40 MG PO TABS: 20.0000 mg | ORAL_TABLET | Freq: Every day | ORAL | Status: DC

## 2014-05-27 NOTE — Progress Notes (Signed)
Discharge education completed by RN. Pt and spouse received a copy of discharge paperwork and confirm understanding of follow up appointments and discharge medications. Both deny any questions at this time. IV removed, site is within normal limits. Pt will discharge from the unit via wheelchair. Spouse to drive pt home.

## 2014-05-27 NOTE — Interval H&P Note (Signed)
Cath Lab Visit (complete for each Cath Lab visit)  Clinical Evaluation Leading to the Procedure:   ACS: No.  Non-ACS:    Anginal Classification: CCS III  Anti-ischemic medical therapy: Maximal Therapy (2 or more classes of medications)  Non-Invasive Test Results: High-risk stress test findings: cardiac mortality >3%/year  Prior CABG: No previous CABG      History and Physical Interval Note:  05/27/2014 7:33 AM  Margaret Huerta  has presented today for surgery, with the diagnosis of abnormal stress test  The various methods of treatment have been discussed with the patient and family. After consideration of risks, benefits and other options for treatment, the patient has consented to  Procedure(s): LEFT HEART CATHETERIZATION WITH CORONARY ANGIOGRAM (N/A) as a surgical intervention .  The patient's history has been reviewed, patient examined, no change in status, stable for surgery.  I have reviewed the patient's chart and labs.  Questions were answered to the patient's satisfaction.     Lorretta Harp

## 2014-05-27 NOTE — Progress Notes (Signed)
Nutrition Brief Note  Patient identified on the Malnutrition Screening Tool (MST) Report.  Wt Readings from Last 15 Encounters:  05/26/14 223 lb 12.8 oz (101.515 kg)  05/24/14 221 lb 6.4 oz (100.426 kg)  05/20/14 222 lb (100.699 kg)  05/12/14 226 lb (102.513 kg)  05/04/14 221 lb (100.245 kg)  02/25/14 224 lb (101.606 kg)  09/02/13 228 lb 6.4 oz (103.602 kg)  08/20/12 232 lb 9.6 oz (105.507 kg)  07/11/12 227 lb (102.967 kg)  06/10/12 226 lb 9.6 oz (102.785 kg)  03/09/10 273 lb 8 oz (124.059 kg)  01/10/10 265 lb (120.203 kg)    Body mass index is 40.92 kg/(m^2). Patient meets criteria for Obesity Class III based on current BMI.   Current diet order is Heart Healthy/Carbohydrate Modified, patient is consuming approximately 100% of meals at this time. Labs and medications reviewed.   No nutrition interventions warranted at this time. If nutrition issues arise, please consult RD.   Arthur Holms, RD, LDN Pager #: [phone] After-Hours Pager #: [phone]

## 2014-05-27 NOTE — Discharge Summary (Signed)
Patient ID: Margaret Huerta,  MRN: [id number], DOB/AGE: August 25, 1945 69 y.o.  Admit date: 05/26/2014 Discharge date: 05/27/2014  Primary Care Provider: [name], MD Primary Cardiologist: Dr Gwenlyn Found  Discharge Diagnoses Principal Problem:   Abnormal stress test (false positive) Active Problems:   Normal coronary arteries 2003 and 05/26/14   Obstructive sleep apnea   Essential hypertension   Chronic renal insufficiency   Dyslipidemia   Family history of coronary artery disease   H/O renal cell cancer-s/p nephrectomy    Procedures: Coronary angiogram 05/27/14   Hospital Course:  69 y.o. moderately overweight, married Caucasian female, mother of 2 living children (one died at age 26 from a gunshot wound), and grandmother to 2 grandchildren. She has a history of hypertension, hyperlipidemia, type 2 diabetes, and obstructive sleep apnea (noncompliant with CPAP) with a strong family history for heart disease with her father dying of an MI at age 44, and a sister who has had 2 myocardial infractions. She has had fibromyalgia as well as right nephrectomy back in 2003 for renal cell carcinoma. She has had bariatric surgery performed at [facility] in February of 2015 and a remote Nissen fundoplication back in [id number]. She was catheterized by Dr. Einar Gip in 2003 and was found to have normal coronary arteries and normal LV function. A Myoview stress test performed in March 2013 was nonischemic.  She was recently seen in [facility] by Truitt Merle, NP, on 05/12/14 for syncope. A cardiac event monitor and NST were both ordered. NST was interpreted as a high risk stress nuclear study with large, severe, reversible defects in the anterior lateral wall, inferior wall and apex; findings suggest multivessel disease. LV Ejection Fraction was normal at 68%.She was admitted 05/26/14 for hydration and diagnostic cath 05/26/14. Her SCr was 1.72 on 4/11- Lasix was held and her SCr was down to 1.35 by 4/14. Cath done 05/28/14  showed normal coronaries and NL LVF. Dr Gwenlyn Found feels she had a false positive Myoview and can be discharged. She still has a monitor and will continue this. Her Glucophage will be held 48 hrs and her Lasix has been cut back.  She'll need a f/u BMP as an OP.   Discharge Vitals:  Blood pressure 138/62, pulse 60, temperature 98.3 F (36.8 C), temperature source Oral, resp. rate 18, height 5\' 2"  (1.575 m), weight 223 lb 12.8 oz (101.515 kg), SpO2 98 %.    Labs: Results for orders placed or performed during the hospital encounter of 05/26/14 (from the past 24 hour(s))  Glucose, capillary     Status: Abnormal   Collection Time: 05/26/14 12:01 PM  Result Value Ref Range   Glucose-Capillary 118 (H) 70 - 99 mg/dL   Comment 1 Notify RN    Comment 2 Document in Chart   Basic metabolic panel     Status: Abnormal   Collection Time: 05/26/14  1:35 PM  Result Value Ref Range   Sodium 141 135 - 145 mmol/L   Potassium 4.5 3.5 - 5.1 mmol/L   Chloride 108 96 - 112 mmol/L   CO2 26 19 - 32 mmol/L   Glucose, Bld 75 70 - 99 mg/dL   BUN 25 (H) 6 - 23 mg/dL   Creatinine, Ser 1.52 (H) 0.50 - 1.10 mg/dL   Calcium 9.8 8.4 - 10.5 mg/dL   GFR calc non Af Amer 35 (L) >90 mL/min   GFR calc Af Amer 40 (L) >90 mL/min   Anion gap 7 5 - 15  Basic  metabolic panel     Status: Abnormal   Collection Time: 05/27/14  5:03 AM  Result Value Ref Range   Sodium 142 135 - 145 mmol/L   Potassium 4.7 3.5 - 5.1 mmol/L   Chloride 109 96 - 112 mmol/L   CO2 25 19 - 32 mmol/L   Glucose, Bld 139 (H) 70 - 99 mg/dL   BUN 21 6 - 23 mg/dL   Creatinine, Ser 1.35 (H) 0.50 - 1.10 mg/dL   Calcium 9.3 8.4 - 10.5 mg/dL   GFR calc non Af Amer 40 (L) >90 mL/min   GFR calc Af Amer 46 (L) >90 mL/min   Anion gap 8 5 - 15  Protime-INR     Status: Abnormal   Collection Time: 05/27/14  5:03 AM  Result Value Ref Range   Prothrombin Time 15.4 (H) 11.6 - 15.2 seconds   INR 1.21 0.00 - 1.49  CBC     Status: Abnormal   Collection Time: 05/27/14   5:03 AM  Result Value Ref Range   WBC 4.9 4.0 - 10.5 K/uL   RBC 4.09 3.87 - 5.11 MIL/uL   Hemoglobin 10.7 (L) 12.0 - 15.0 g/dL   HCT 34.0 (L) 36.0 - 46.0 %   MCV 83.1 78.0 - 100.0 fL   MCH 26.2 26.0 - 34.0 pg   MCHC 31.5 30.0 - 36.0 g/dL   RDW 16.1 (H) 11.5 - 15.5 %   Platelets 201 150 - 400 K/uL  Glucose, capillary     Status: Abnormal   Collection Time: 05/27/14  8:58 AM  Result Value Ref Range   Glucose-Capillary 150 (H) 70 - 99 mg/dL   Comment 1 Notify RN    Comment 2 Document in Chart     Disposition:      Follow-up Information    Follow up with Lorretta Harp, MD.   Specialty:  Cardiology   Why:  office will call you   Contact information:   [address] [place] [address] [place] [address] [phone]       Discharge Medications:    Medication List    STOP taking these medications        aspirin 81 MG tablet     isosorbide mononitrate 60 MG 24 hr tablet  Commonly known as:  IMDUR     multivitamin capsule     nitroGLYCERIN 0.4 MG SL tablet  Commonly known as:  NITROSTAT      TAKE these medications        acetaminophen 325 MG tablet  Commonly known as:  TYLENOL  Take 2 tablets (650 mg total) by mouth every 4 (four) hours as needed for headache or mild pain.     allopurinol 300 MG tablet  Commonly known as:  ZYLOPRIM  Take 300 mg by mouth daily.     atenolol 100 MG tablet  Commonly known as:  TENORMIN  Take 50 mg by mouth 2 (two) times daily.     cholecalciferol 1000 UNITS tablet  Commonly known as:  VITAMIN D  Take 1,000 Units by mouth daily.     diphenhydrAMINE 25 mg capsule  Commonly known as:  BENADRYL  Take 25 mg by mouth 2 (two) times daily as needed.     docusate sodium 100 MG capsule  Commonly known as:  COLACE  Take 100 mg by mouth daily.     DULoxetine 60 MG capsule  Commonly known as:  CYMBALTA  Take 60 mg by mouth daily.  feeding supplement (ENSURE ENLIVE) Liqd  Take 237 mLs by mouth 2 (two) times daily between  meals.     furosemide 40 MG tablet  Commonly known as:  LASIX  Take 0.5 tablets (20 mg total) by mouth daily. Take 40 mg in the AM and 20 mg in the PM  Start taking on:  05/29/2014     gabapentin 100 MG capsule  Commonly known as:  NEURONTIN  Take 100 mg by mouth 3 (three) times daily.     insulin aspart 100 UNIT/ML injection  Commonly known as:  novoLOG  Inject 5-10 Units into the skin 3 (three) times daily before meals.     LANTUS 100 UNIT/ML injection  Generic drug:  insulin glargine  Inject 50 Units into the skin at bedtime.     metFORMIN 500 MG 24 hr tablet  Commonly known as:  GLUCOPHAGE-XR  Take 2 tablets (1,000 mg total) by mouth daily with breakfast.  Start taking on:  05/30/2014     rosuvastatin 20 MG tablet  Commonly known as:  CRESTOR  Take 20 mg by mouth daily.     telmisartan 80 MG tablet  Commonly known as:  MICARDIS  Take 1 tablet (80 mg total) by mouth daily.  Start taking on:  05/29/2014     verapamil 120 MG tablet  Commonly known as:  CALAN  Take 120 mg by mouth daily.     vitamin B-12 1000 MCG tablet  Commonly known as:  CYANOCOBALAMIN  Take 1,000 mcg by mouth daily.         Duration of Discharge Encounter: Greater than 30 minutes including physician time.  Angelena Form PA-C 05/27/2014 11:20 AM

## 2014-05-27 NOTE — Progress Notes (Signed)
UR completed 

## 2014-05-27 NOTE — Progress Notes (Signed)
TR band off at this time; no bleeding at this time; gauze and tegarderm applied to site; will cont. To monitor.

## 2014-05-27 NOTE — Discharge Instructions (Signed)
Radial Site Care °Refer to this sheet in the next few weeks. These instructions provide you with information on caring for yourself after your procedure. Your caregiver may also give you more specific instructions. Your treatment has been planned according to current medical practices, but problems sometimes occur. Call your caregiver if you have any problems or questions after your procedure. °HOME CARE INSTRUCTIONS °· You may shower the day after the procedure. Remove the bandage (dressing) and gently wash the site with plain soap and water. Gently pat the site dry. °· Do not apply powder or lotion to the site. °· Do not submerge the affected site in water for 3 to 5 days. °· Inspect the site at least twice daily. °· Do not flex or bend the affected arm for 24 hours. °· No lifting over 5 pounds (2.3 kg) for 5 days after your procedure. °· Do not drive home if you are discharged the same day of the procedure. Have someone else drive you. °· You may drive 24 hours after the procedure unless otherwise instructed by your caregiver. °· Do not operate machinery or power tools for 24 hours. °· A responsible adult should be with you for the first 24 hours after you arrive home. °What to expect: °· Any bruising will usually fade within 1 to 2 weeks. °· Blood that collects in the tissue (hematoma) may be painful to the touch. It should usually decrease in size and tenderness within 1 to 2 weeks. °SEEK IMMEDIATE MEDICAL CARE IF: °· You have unusual pain at the radial site. °· You have redness, warmth, swelling, or pain at the radial site. °· You have drainage (other than a small amount of blood on the dressing). °· You have chills. °· You have a fever or persistent symptoms for more than 72 hours. °· You have a fever and your symptoms suddenly get worse. °· Your arm becomes pale, cool, tingly, or numb. °· You have heavy bleeding from the site. Hold pressure on the site. °Document Released: 03/03/2010 Document Revised:  04/23/2011 Document Reviewed: 03/03/2010 °ExitCare® Patient Information ©2015 [employer]. This information is not intended to replace advice given to you by your health care provider. Make sure you discuss any questions you have with your health care provider. ° °

## 2014-05-27 NOTE — CV Procedure (Signed)
Margaret Huerta is a 69 y.o. female    [id number] LOCATION:  FACILITY: [facility]  PHYSICIAN: Quay Burow, M.D. 01-25-1946   DATE OF PROCEDURE:  05/27/2014  DATE OF DISCHARGE:     CARDIAC CATHETERIZATION     History obtained from chart review.69 y/o female with history of hypertension, hyperlipidemia, type 2 diabetes, and obstructive sleep apnea (noncompliant with CPAP) with a strong family history for heart disease, remote renal cell carcinoma s/p right nephrectomy, chronic renal insufficieny (baseline Scr 1.7), admitted for pre-cath hydration with plans for LHC due to recent high risk NST and recent h/o syncope and chest pain.    PROCEDURE DESCRIPTION:   The patient was brought to the second floor [facility] cath lab in the postabsorptive state. She was not premedicated . Her right wrist was prepped and shaved in usual sterile fashion. Xylocaine 1% was used for local anesthesia. A 5/6 French sheath was inserted into the right radial artery using standard Seldinger technique. The patient received 5000 units  of Heparin intravenously.  A 5 [place] TIG catheter and pigtail catheters were used for selective coronary angiography and left ventriculography respectively. Visipaque was used for the entirety of the case. Retrograde aortic, left ventricular and pullback pressures were recorded. Radial cocktail was administered via the SideArm sheath.    HEMODYNAMICS:    AO SYSTOLIC/AO DIASTOLIC: 123456   LV SYSTOLIC/LV DIASTOLIC: 123XX123 (there was no obvious pullback gradient noted)  ANGIOGRAPHIC RESULTS:   1. Left main; normal  2. LAD; normal 3. Left circumflex; codominant and normal.  4. Right coronary artery; codominant and normal 5. Left ventriculography; RAO left ventriculogram was performed using  25 mL of Visipaque dye at 12 mL/second. The overall LVEF estimated  60 % Without wall motion abnormalities  IMPRESSION:Margaret Huerta has normal coronary arteries and normal left  ventricular function. I believe her chest pain is noncardiac and her Myoview false-positive. I used a total of 40 mL of contrast for the case. Her serum creatinine was 1.35. She'll be discharged home later today and will see mid-level provider back in the office in 2-3 weeks. The etiology of her syncope is still unclear.  Lorretta Harp MD, [facility] 05/27/2014 8:09 AM

## 2014-06-01 ENCOUNTER — Encounter: Payer: Self-pay | Admitting: *Deleted

## 2014-06-07 DIAGNOSIS — F3340 Major depressive disorder, recurrent, in remission, unspecified: Secondary | ICD-10-CM | POA: Diagnosis not present

## 2014-06-07 DIAGNOSIS — G4700 Insomnia, unspecified: Secondary | ICD-10-CM | POA: Diagnosis not present

## 2014-06-14 ENCOUNTER — Telehealth (HOSPITAL_COMMUNITY): Payer: Self-pay

## 2014-06-21 ENCOUNTER — Ambulatory Visit (INDEPENDENT_AMBULATORY_CARE_PROVIDER_SITE_OTHER): Payer: Medicare Other | Admitting: Cardiology

## 2014-06-21 ENCOUNTER — Encounter: Payer: Self-pay | Admitting: Cardiology

## 2014-06-21 VITALS — BP 116/56 | HR 72 | Ht 62.0 in | Wt 218.5 lb

## 2014-06-21 DIAGNOSIS — R55 Syncope and collapse: Secondary | ICD-10-CM | POA: Diagnosis not present

## 2014-06-21 LAB — BASIC METABOLIC PANEL WITH GFR
BUN: 42 mg/dL — AB (ref 6–23)
CHLORIDE: 104 meq/L (ref 96–112)
CO2: 27 mEq/L (ref 19–32)
CREATININE: 2.74 mg/dL — AB (ref 0.50–1.10)
Calcium: 10 mg/dL (ref 8.4–10.5)
GFR, Est African American: 20 mL/min — ABNORMAL LOW
GFR, Est Non African American: 17 mL/min — ABNORMAL LOW
GLUCOSE: 209 mg/dL — AB (ref 70–99)
Potassium: 5 mEq/L (ref 3.5–5.3)
Sodium: 137 mEq/L (ref 135–145)

## 2014-06-21 MED ORDER — ATENOLOL 25 MG PO TABS: 25.0000 mg | ORAL_TABLET | Freq: Two times a day (BID) | ORAL | Status: DC

## 2014-06-21 NOTE — Patient Instructions (Signed)
Your physician recommends that you schedule a follow-up appointment in: DR BERRY NEXT AVAILABLE  REFERRAL TO ELECTROPHYSIOLOGY FOR SYNCOPE  DECREASE ATENOLOL TO 25 MG TWICE DAILY

## 2014-06-21 NOTE — Progress Notes (Signed)
Cardiology Office Note   Date:  06/21/2014   ID:  Margaret Huerta, DOB 06-26-1945, MRN [id number]  PCP:  [name], MD  Cardiologist:    Dr. Gwenlyn Found  Chief Complaint  Patient presents with  . Dizziness    WHEN STANDING      History of Present Illness: Margaret Huerta is a 69 y.o. female who presents for follow up of syncope and post cath.  Also event monitor for 30 days.    She has a history of hypertension, hyperlipidemia, type 2 diabetes, and obstructive sleep apnea with a strong family history for heart disease with her father dying of an MI at age 58, and a sister, who is also a patient, has had 2 myocardial infractions. She has had fibromyalgia as well as right nephrectomy back in 2003 for renal cell carcinoma. She has had bariatric surgery performed at [facility] in February of last year and a remote Nissen fundoplication back in [id number]. She was catheterized by Dr. Einar Gip in 2003 and was found to have normal coronary arteries and normal LV function. She was complaining of chest pain radiating to her jaw. A Myoview stress test performed in March 2013 was nonischemic. Her Imdur was increased  from 30 mg to 60 mg a day, which resulted in significant improvement in her chest pain. She was seen in flex clinic for syncope, described as standing or walking and falling, not sure what happened.  She did have orthostatic hypotension in clinic and meds were adjusted.  With her lasix decreased and atenolol cut in half to 50 mg BID.  She has a stress test that was + and underwent cardiac cath.  She had normal coronary arteries and normal left ventricular function. Dr. Gwenlyn Found believes her chest pain is noncardiac and her Myoview false-positive.  Her event monitor did not reveal any significant arrhthymias.  She only had brief dizziness while wearing.    Today with dizziness,  When getting up.  She still has occ sharp shooting chest pain.  Reassured not cardiac.    Today Lying BP 112/64, sitting 110/57 standing  97/57 and after 3 min. 120/70.  Pulse in the 40s though this was not noted on event monitor.  Has been told not to drive.   Past Medical History  Diagnosis Date  . Hyperlipidemia   . Hypertension   . Neuropathy   . Osteoporosis   . Fibromyalgia   . Depression   . Hx of cardiovascular stress test April 2016    false positive Myoview  . Pneumonia "several times"  . Chronic bronchitis     "get it q yr"  . Sleep apnea     "wore mask for 2 months; could not take it" (05/26/2014)  . Parathyroid disease     "my PHT levels run high; I can't take calcium"  . Type II diabetes mellitus   . Anemia   . History of blood transfusion     "S/P tonsillectomy"  . History of hiatal hernia   . GERD (gastroesophageal reflux disease)   . Migraine     "monthly" (05/26/2014)  . Febrile seizure     "as a child"  . Typhus fever     "as a child"  . Chronic lower back pain   . Gout   . Anxiety   . Renal insufficiency     "left kidney works at 40-60%" (05/26/2014)  . Cervical cancer   . Renal cancer     "right"-s/p nephrectomy  Past Surgical History  Procedure Laterality Date  . Nephrectomy Right 2002    "cancer"  . Back surgery    . Appendectomy    . Tmj arthroplasty  1988  . Dilation and curettage of uterus    . Tonsillectomy  1976  . Incontinence surgery  1983  . Cystoscopy w/ Hett manipulation  1988  . Mouth surgery  1991    "drilled into gum and put tooth implants uppers"  . Upper gi endoscopy    . Knee arthroscopy Left 2000's  . Gastric bypass  2012  . Cardiac catheterization  2003, April 2016    normal coronaries  . Eye surgery    . Laparoscopic cholecystectomy  1990's  . Hernia repair    . Abdominal hernia repair  02/2013    "in [place]"  . Lumbar disc surgery  2001    "herniated discs"  . Abdominal hysterectomy  1977    "partial"  . Bilateral salpingoophorectomy Bilateral ~ 1993  . Hiatal hernia repair  1996  . Diagnostic laparoscopy  1975  .  Esophagogastroduodenoscopy (egd) with esophageal dilation  1997  . Cataract extraction w/ intraocular lens  implant, bilateral  2015  . Left heart catheterization with coronary angiogram N/A 05/27/2014    Procedure: LEFT HEART CATHETERIZATION WITH CORONARY ANGIOGRAM;  Surgeon: Lorretta Harp, MD;  Location: [facility] CATH LAB;  Service: Cardiovascular;  Laterality: N/A;     Current Outpatient Prescriptions  Medication Sig Dispense Refill  . acetaminophen (TYLENOL) 325 MG tablet Take 2 tablets (650 mg total) by mouth every 4 (four) hours as needed for headache or mild pain.    Marland Kitchen allopurinol (ZYLOPRIM) 300 MG tablet Take 300 mg by mouth daily.      Marland Kitchen atenolol (TENORMIN) 100 MG tablet Take 50 mg by mouth 2 (two) times daily.    . cholecalciferol (VITAMIN D) 1000 UNITS tablet Take 1,000 Units by mouth daily.    . diphenhydrAMINE (BENADRYL) 25 mg capsule Take 25 mg by mouth 2 (two) times daily as needed.    . docusate sodium (COLACE) 100 MG capsule Take 100 mg by mouth daily.    . DULoxetine (CYMBALTA) 60 MG capsule Take 60 mg by mouth daily.      . feeding supplement, ENSURE ENLIVE, (ENSURE ENLIVE) LIQD Take 237 mLs by mouth 2 (two) times daily between meals. 237 mL 12  . furosemide (LASIX) 40 MG tablet Take 0.5 tablets (20 mg total) by mouth daily. Take 40 mg in the AM and 20 mg in the PM 30 tablet   . gabapentin (NEURONTIN) 100 MG capsule Take 100 mg by mouth 3 (three) times daily.    . insulin aspart (NOVOLOG) 100 UNIT/ML injection Inject 5-10 Units into the skin 3 (three) times daily before meals.     . insulin glargine (LANTUS) 100 UNIT/ML injection Inject 50 Units into the skin at bedtime.     . isosorbide mononitrate (IMDUR) 60 MG 24 hr tablet Take 60 mg by mouth daily.    . metFORMIN (GLUCOPHAGE-XR) 500 MG 24 hr tablet Take 2 tablets (1,000 mg total) by mouth daily with breakfast.    . NITROSTAT 0.4 MG SL tablet Place 0.4 mg under the tongue as needed.    . rosuvastatin (CRESTOR) 20 MG tablet  Take 20 mg by mouth daily.      Marland Kitchen telmisartan (MICARDIS) 80 MG tablet Take 1 tablet (80 mg total) by mouth daily.    . verapamil (CALAN) 120 MG tablet Take  120 mg by mouth daily.     . vitamin B-12 (CYANOCOBALAMIN) 1000 MCG tablet Take 1,000 mcg by mouth daily.       No current facility-administered medications for this visit.    Allergies:   Demerol; Hydrocodone-acetaminophen; Meperidine hcl; Vicodin; Codeine; Fentanyl; Hydromorphone hcl; Morphine; Naproxen; and Dilaudid    Social History:  The patient  reports that she has never smoked. She has never used smokeless tobacco. She reports that she does not drink alcohol or use illicit drugs.   Family History:  The patient's family history includes Allergies in an other family member; Clotting disorder in her mother; Diabetes in her mother; Emphysema in her father; Heart disease in her father; Kidney disease in her mother; Lung cancer in her maternal uncle.    ROS:  General:no colds or fevers, no weight changes Skin:no rashes or ulcers HEENT:no blurred vision, no congestion CV:see HPI PUL:see HPI GI:no diarrhea constipation or melena, no indigestion GU:no hematuria, no dysuria MS:no joint pain, no claudication Neuro:no syncope, + lightheadedness but no falls Endo:+ diabetes, no thyroid disease  Wt Readings from Last 3 Encounters:  06/21/14 218 lb 8 oz (99.111 kg)  05/26/14 223 lb 12.8 oz (101.515 kg)  05/24/14 221 lb 6.4 oz (100.426 kg)     PHYSICAL EXAM: VS:  BP 116/56 mmHg  Pulse 72  Ht 5\' 2"  (1.575 m)  Wt 218 lb 8 oz (99.111 kg)  BMI 39.95 kg/m2 , BMI Body mass index is 39.95 kg/(m^2). General:Pleasant affect, NAD Skin:Warm and dry, brisk capillary refill HEENT:normocephalic, sclera clear, mucus membranes moist Neck:supple, no JVD, no bruits  Heart:S1S2 RRR without murmur, gallup, rub or click Lungs:clear without rales, rhonchi, or wheezes [id number], non tender, + BS, do not palpate liver spleen or masses Ext:no lower  ext edema, 2+ pedal pulses, 2+ radial pulses Neuro:alert and oriented X 3, MAE, follows commands, + facial symmetry    EKG:  EKG is NOT ordered today.    Recent Labs: 05/12/2014: TSH 1.80 05/27/2014: BUN 21; Creatinine 1.35*; Hemoglobin 10.7*; Platelets 201; Potassium 4.7; Sodium 142    Lipid Panel No results found for: CHOL, TRIG, HDL, CHOLHDL, VLDL, LDLCALC, LDLDIRECT     Other studies Reviewed: Additional studies/ records that were reviewed today include: cath results, event monitor labs. .   ASSESSMENT AND PLAN:   Abnormal stress test (false positive)   Normal coronary arteries 2003 and 05/26/14 with cardiac cath- she will follow up with Dr. Adora Fridge in 3 moths.    Syncope, numerous episodes, now with only dizziness but continues.  She is on BB and CCB, with orthostatic hypotension will further decrease her BB to 25 mg BID.  I would like to do Tilt study, to eval for neuromediated syncope, to see if only BP or if HR drops.  I will send for EP eval to see if this is appropriate vs loop recorder.  I would prefer her not to drive until symptoms resolved or cleared by EP       Obstructive sleep apnea stable   Essential hypertension now with orthostatic hypotension   Chronic renal insufficiency- recheck labs today post cath   Dyslipidemia   Family history of coronary artery disease   H/O renal cell cancer-s/p nephrectomy   Current medicines are reviewed with the patient today.  The patient Has no concerns regarding medicines.  The following changes have been made:  See above Labs/ tests ordered today include:see above  Disposition:   FU:  see above  Lennie Muckle, NP  06/21/2014 3:42 PM    [facility] [place], [place], [place], [place] Phone: [phone]; Fax: [phone]

## 2014-06-22 ENCOUNTER — Other Ambulatory Visit: Payer: Self-pay | Admitting: *Deleted

## 2014-06-22 DIAGNOSIS — N183 Chronic kidney disease, stage 3 unspecified: Secondary | ICD-10-CM

## 2014-06-24 LAB — BASIC METABOLIC PANEL WITH GFR
BUN: 49 mg/dL — AB (ref 6–23)
CALCIUM: 9.1 mg/dL (ref 8.4–10.5)
CO2: 21 mEq/L (ref 19–32)
Chloride: 105 mEq/L (ref 96–112)
Creat: 2.89 mg/dL — ABNORMAL HIGH (ref 0.50–1.10)
GFR, EST AFRICAN AMERICAN: 19 mL/min — AB
GFR, Est Non African American: 16 mL/min — ABNORMAL LOW
GLUCOSE: 128 mg/dL — AB (ref 70–99)
Potassium: 4.5 mEq/L (ref 3.5–5.3)
Sodium: 135 mEq/L (ref 135–145)

## 2014-06-28 ENCOUNTER — Telehealth: Payer: Self-pay | Admitting: *Deleted

## 2014-06-28 DIAGNOSIS — R748 Abnormal levels of other serum enzymes: Secondary | ICD-10-CM | POA: Diagnosis not present

## 2014-06-28 DIAGNOSIS — R7989 Other specified abnormal findings of blood chemistry: Secondary | ICD-10-CM | POA: Diagnosis not present

## 2014-06-28 NOTE — Telephone Encounter (Signed)
Patient notified of results Labs sent to nephrologist per patient request Order placed for repeat lab

## 2014-06-28 NOTE — Telephone Encounter (Signed)
-----   Message from Isaiah Serge, NP sent at 06/28/2014  8:48 AM EDT ----- Hold lasix for 2 days along with micardis.  Recheck BMP for CKD 5 -4. With recent cath dye.

## 2014-06-29 ENCOUNTER — Encounter: Payer: Self-pay | Admitting: Internal Medicine

## 2014-06-29 ENCOUNTER — Ambulatory Visit (INDEPENDENT_AMBULATORY_CARE_PROVIDER_SITE_OTHER): Payer: Medicare Other | Admitting: Internal Medicine

## 2014-06-29 VITALS — BP 136/73 | HR 56 | Ht 62.0 in | Wt 219.2 lb

## 2014-06-29 DIAGNOSIS — G4733 Obstructive sleep apnea (adult) (pediatric): Secondary | ICD-10-CM | POA: Diagnosis not present

## 2014-06-29 DIAGNOSIS — I10 Essential (primary) hypertension: Secondary | ICD-10-CM

## 2014-06-29 DIAGNOSIS — R55 Syncope and collapse: Secondary | ICD-10-CM

## 2014-06-29 NOTE — Assessment & Plan Note (Signed)
Her symptoms are predominantly orthostatic in nature and I have recommended that she be allowed to drive as they have not occurred while sitting or driving. I have asked that she increase her fluid intake and stop taking nitrates, calcium channel blockers or ARBS. She will have HTN and she will have to live with this but hopefully she will avoid syncope. I have asked her to avoid caffeine or ETOH.

## 2014-06-29 NOTE — Patient Instructions (Signed)
Medication Instructions:  Your physician has recommended you make the following change in your medication:  1) Stop Atenolol 2) Stop Verapamil 3) Stop Imdur     Labwork: None ordered  Testing/Procedures: None ordered  Follow-Up: Your physician recommends that you schedule a follow-up appointment in: 3 months with Dr [name]   Any Other Special Instructions Will Be Listed Below (If Applicable).  No caffeine  And increase fluid intake

## 2014-06-29 NOTE — Progress Notes (Signed)
HPI Margaret Huerta is referred today by Dr. Gwenlyn Found for evaluation of syncope. She is a pleasant 69 yo woman who states that she has passed out since she was a baby. In reality she probably had seizures. She has passed out on multiple occaisions over the past years and she thinks she may have had 15 episodes in the past year. These always occur while standing and she has not injured herself. She has undergone evaluation in the past and orthostatic vitals have demonstrated drops in her blood pressure. She has been on multiple medications which could exacerbate her symptoms including nitrates and calcium channel blockers and ARB drugs. She has systolic hypertension making matters worse.  Allergies  Allergen Reactions  . Demerol [Meperidine] Shortness Of Breath    Stop breathing  . Hydrocodone-Acetaminophen Shortness Of Breath    REACTION: stop breathing  . Meperidine Hcl Shortness Of Breath    REACTION: stop breathing  . Vicodin [Hydrocodone-Acetaminophen] Shortness Of Breath    Stop breathing  . Codeine     REACTION: hallucination, confused  . Fentanyl   . Hydromorphone Hcl     REACTION: itching, rash, nausea, vomiting  . Morphine     REACTION: vomiting  . Naproxen     REACTION: stomach lesion  . Dilaudid [Hydromorphone Hcl] Itching, Nausea And Vomiting and Rash     Current Outpatient Prescriptions  Medication Sig Dispense Refill  . acetaminophen (TYLENOL) 325 MG tablet Take 2 tablets (650 mg total) by mouth every 4 (four) hours as needed for headache or mild pain.    Marland Kitchen allopurinol (ZYLOPRIM) 300 MG tablet Take 300 mg by mouth daily.      . cholecalciferol (VITAMIN D) 1000 UNITS tablet Take 1,000 Units by mouth daily.    . diphenhydrAMINE (BENADRYL) 25 mg capsule Take 25 mg by mouth 2 (two) times daily as needed.    . docusate sodium (COLACE) 100 MG capsule Take 100 mg by mouth daily.    . DULoxetine (CYMBALTA) 60 MG capsule Take 60 mg by mouth daily.      . feeding supplement,  ENSURE ENLIVE, (ENSURE ENLIVE) LIQD Take 237 mLs by mouth 2 (two) times daily between meals. 237 mL 12  . furosemide (LASIX) 40 MG tablet Take 0.5 tablets (20 mg total) by mouth daily. Take 40 mg in the AM and 20 mg in the PM 30 tablet   . gabapentin (NEURONTIN) 100 MG capsule Take 100 mg by mouth 3 (three) times daily.    . insulin aspart (NOVOLOG) 100 UNIT/ML injection Inject 5-10 Units into the skin 3 (three) times daily before meals.     . insulin glargine (LANTUS) 100 UNIT/ML injection Inject 50 Units into the skin at bedtime.     . metFORMIN (GLUCOPHAGE-XR) 500 MG 24 hr tablet Take 2 tablets (1,000 mg total) by mouth daily with breakfast.    . NITROSTAT 0.4 MG SL tablet Place 0.4 mg under the tongue as needed.    . rosuvastatin (CRESTOR) 20 MG tablet Take 20 mg by mouth daily.      Marland Kitchen telmisartan (MICARDIS) 80 MG tablet Take 1 tablet (80 mg total) by mouth daily.    . vitamin B-12 (CYANOCOBALAMIN) 1000 MCG tablet Take 1,000 mcg by mouth daily.       No current facility-administered medications for this visit.     Past Medical History  Diagnosis Date  . Hyperlipidemia   . Hypertension   . Neuropathy   . Osteoporosis   .  Fibromyalgia   . Depression   . Hx of cardiovascular stress test April 2016    false positive Myoview  . Pneumonia "several times"  . Chronic bronchitis     "get it q yr"  . Sleep apnea     "wore mask for 2 months; could not take it" (05/26/2014)  . Parathyroid disease     "my PHT levels run high; I can't take calcium"  . Type II diabetes mellitus   . Anemia   . History of blood transfusion     "S/P tonsillectomy"  . History of hiatal hernia   . GERD (gastroesophageal reflux disease)   . Migraine     "monthly" (05/26/2014)  . Febrile seizure     "as a child"  . Typhus fever     "as a child"  . Chronic lower back pain   . Gout   . Anxiety   . Renal insufficiency     "left kidney works at 40-60%" (05/26/2014)  . Cervical cancer   . Renal cancer      "right"-s/p nephrectomy    ROS:   All systems reviewed and negative except as noted in the HPI.   Past Surgical History  Procedure Laterality Date  . Nephrectomy Right 2002    "cancer"  . Back surgery    . Appendectomy    . Tmj arthroplasty  1988  . Dilation and curettage of uterus    . Tonsillectomy  1976  . Incontinence surgery  1983  . Cystoscopy w/ Martel manipulation  1988  . Mouth surgery  1991    "drilled into gum and put tooth implants uppers"  . Upper gi endoscopy    . Knee arthroscopy Left 2000's  . Gastric bypass  2012  . Cardiac catheterization  2003, April 2016    normal coronaries  . Eye surgery    . Laparoscopic cholecystectomy  1990's  . Hernia repair    . Abdominal hernia repair  02/2013    "in [place]"  . Lumbar disc surgery  2001    "herniated discs"  . Abdominal hysterectomy  1977    "partial"  . Bilateral salpingoophorectomy Bilateral ~ 1993  . Hiatal hernia repair  1996  . Diagnostic laparoscopy  1975  . Esophagogastroduodenoscopy (egd) with esophageal dilation  1997  . Cataract extraction w/ intraocular lens  implant, bilateral  2015  . Left heart catheterization with coronary angiogram N/A 05/27/2014    Procedure: LEFT HEART CATHETERIZATION WITH CORONARY ANGIOGRAM;  Surgeon: Lorretta Harp, MD;  Location: [facility] CATH LAB;  Service: Cardiovascular;  Laterality: N/A;     Family History  Problem Relation Age of Onset  . Emphysema Father   . Heart disease Father   . Allergies      whole family per pt  . Clotting disorder Mother   . Diabetes Mother   . Kidney disease Mother   . Lung cancer Maternal Uncle      History   Social History  . Marital Status: Married    Spouse Name: N/A  . Number of Children: N/A  . Years of Education: N/A   Occupational History  . Housewife    Social History Main Topics  . Smoking status: Never Smoker   . Smokeless tobacco: Never Used  . Alcohol Use: No  . Drug Use: No  . Sexual Activity: Yes     Birth Control/ Protection: Surgical   Other Topics Concern  . Not on file   Social History  Narrative     BP 136/73 mmHg  Pulse 56  Ht 5\' 2"  (1.575 m)  Wt 219 lb 3.2 oz (99.428 kg)  BMI 40.08 kg/m2  Physical Exam:  obese appearing NAD HEENT: Unremarkable Neck:  No JVD, no thyromegally Back:  No CVA tenderness Lungs:  Clear with no wheezes HEART:  Regular rate rhythm, no murmurs, no rubs, no clicks Abd:  soft, positive bowel sounds, no organomegally, no rebound, no guarding Ext:  2 plus pulses, no edema, no cyanosis, no clubbing Skin:  No rashes no nodules Neuro:  CN II through XII intact, motor grossly intact  EKG - old ECG's reviewed and demonstrate NSR   Assess/Plan:

## 2014-06-29 NOTE — Assessment & Plan Note (Signed)
The importance of weight loss was reviewed.

## 2014-06-29 NOTE — Assessment & Plan Note (Signed)
Her blood pressure is elevated today. It will likely go higher but we will need to live with this.

## 2014-06-29 NOTE — Assessment & Plan Note (Signed)
She is encouraged to use cpap as needed.

## 2014-06-30 LAB — BASIC METABOLIC PANEL
BUN: 41 mg/dL — ABNORMAL HIGH (ref 6–23)
CALCIUM: 9.7 mg/dL (ref 8.4–10.5)
CHLORIDE: 102 meq/L (ref 96–112)
CO2: 20 meq/L (ref 19–32)
CREATININE: 2.46 mg/dL — AB (ref 0.50–1.10)
GLUCOSE: 182 mg/dL — AB (ref 70–99)
POTASSIUM: 4.3 meq/L (ref 3.5–5.3)
Sodium: 136 mEq/L (ref 135–145)

## 2014-07-02 ENCOUNTER — Other Ambulatory Visit: Payer: Self-pay

## 2014-07-02 ENCOUNTER — Encounter: Payer: Self-pay | Admitting: Cardiovascular Disease

## 2014-07-02 DIAGNOSIS — R799 Abnormal finding of blood chemistry, unspecified: Secondary | ICD-10-CM

## 2014-07-02 DIAGNOSIS — I10 Essential (primary) hypertension: Secondary | ICD-10-CM

## 2014-07-02 DIAGNOSIS — R7989 Other specified abnormal findings of blood chemistry: Secondary | ICD-10-CM

## 2014-07-05 DIAGNOSIS — I10 Essential (primary) hypertension: Secondary | ICD-10-CM | POA: Diagnosis not present

## 2014-07-05 DIAGNOSIS — R748 Abnormal levels of other serum enzymes: Secondary | ICD-10-CM | POA: Diagnosis not present

## 2014-07-05 DIAGNOSIS — R7989 Other specified abnormal findings of blood chemistry: Secondary | ICD-10-CM | POA: Diagnosis not present

## 2014-07-05 DIAGNOSIS — R799 Abnormal finding of blood chemistry, unspecified: Secondary | ICD-10-CM | POA: Diagnosis not present

## 2014-07-06 LAB — BASIC METABOLIC PANEL
BUN: 42 mg/dL — AB (ref 6–23)
CALCIUM: 9.8 mg/dL (ref 8.4–10.5)
CO2: 25 meq/L (ref 19–32)
Chloride: 104 mEq/L (ref 96–112)
Creat: 2.4 mg/dL — ABNORMAL HIGH (ref 0.50–1.10)
Glucose, Bld: 112 mg/dL — ABNORMAL HIGH (ref 70–99)
POTASSIUM: 3.7 meq/L (ref 3.5–5.3)
SODIUM: 138 meq/L (ref 135–145)

## 2014-07-07 ENCOUNTER — Ambulatory Visit (INDEPENDENT_AMBULATORY_CARE_PROVIDER_SITE_OTHER): Payer: Medicare Other | Admitting: Cardiovascular Disease

## 2014-07-07 ENCOUNTER — Encounter: Payer: Self-pay | Admitting: Cardiovascular Disease

## 2014-07-07 VITALS — BP 146/80 | HR 116 | Ht 62.0 in | Wt 220.8 lb

## 2014-07-07 DIAGNOSIS — E785 Hyperlipidemia, unspecified: Secondary | ICD-10-CM

## 2014-07-07 DIAGNOSIS — N183 Chronic kidney disease, stage 3 unspecified: Secondary | ICD-10-CM

## 2014-07-07 DIAGNOSIS — I10 Essential (primary) hypertension: Secondary | ICD-10-CM | POA: Diagnosis not present

## 2014-07-07 MED ORDER — ATENOLOL 50 MG PO TABS: 50.0000 mg | ORAL_TABLET | Freq: Every day | ORAL | Status: DC

## 2014-07-07 NOTE — Progress Notes (Signed)
07/07/2014 Margaret Huerta   May 20, 1945  [id number]  Primary Physician [name], MD Primary Cardiologist: Lorretta Harp MD Margaret Huerta   HPI:  69 y/o female with history of hypertension, hyperlipidemia, type 2 diabetes, and obstructive sleep apnea (noncompliant with CPAP with a strong family history for heart disease, remote renal cell carcinoma s/p right nephrectomy, chronic renal insufficieny (baseline Scr 1.7), admitted for pre-cath hydration with plans for LHC due to recent high risk NST and recent h/o syncope and chest pain. She had cardiac catheterization performed by Dr. Einar Gip 2003 revealing normal coronary arteries. She recently had a high risk nuclear stress test and was admitted for hydration prior to her cath which I performed on 05/27/14 was revealed normal coronary arteries and normal LV function. She saw Dr. Crissie Sickles for evaluation syncope which she thought was related to orthostasis and adjusted her anti-hypertensive medications.   Current Outpatient Prescriptions  Medication Sig Dispense Refill  . acetaminophen (TYLENOL) 325 MG tablet Take 2 tablets (650 mg total) by mouth every 4 (four) hours as needed for headache or mild pain.    Marland Kitchen allopurinol (ZYLOPRIM) 300 MG tablet Take 300 mg by mouth daily.      . cholecalciferol (VITAMIN D) 1000 UNITS tablet Take 1,000 Units by mouth daily.    . diphenhydrAMINE (BENADRYL) 25 mg capsule Take 25 mg by mouth 2 (two) times daily as needed.    . docusate sodium (COLACE) 100 MG capsule Take 100 mg by mouth daily.    . DULoxetine (CYMBALTA) 60 MG capsule Take 60 mg by mouth daily.      . feeding supplement, ENSURE ENLIVE, (ENSURE ENLIVE) LIQD Take 237 mLs by mouth 2 (two) times daily between meals. 237 mL 12  . furosemide (LASIX) 40 MG tablet Take 0.5 tablets (20 mg total) by mouth daily. Take 40 mg in the AM and 20 mg in the PM 30 tablet   . gabapentin (NEURONTIN) 100 MG capsule Take 100 mg by mouth 3 (three) times daily.     . insulin aspart (NOVOLOG) 100 UNIT/ML injection Inject 5-10 Units into the skin 3 (three) times daily before meals.     . insulin glargine (LANTUS) 100 UNIT/ML injection Inject 50 Units into the skin at bedtime.     . metFORMIN (GLUCOPHAGE-XR) 500 MG 24 hr tablet Take 2 tablets (1,000 mg total) by mouth daily with breakfast.    . NITROSTAT 0.4 MG SL tablet Place 0.4 mg under the tongue as needed.    . rosuvastatin (CRESTOR) 20 MG tablet Take 20 mg by mouth daily.      Marland Kitchen telmisartan (MICARDIS) 80 MG tablet Take 1 tablet (80 mg total) by mouth daily.    . vitamin B-12 (CYANOCOBALAMIN) 1000 MCG tablet Take 1,000 mcg by mouth daily.      Marland Kitchen atenolol (TENORMIN) 50 MG tablet Take 1 tablet (50 mg total) by mouth daily. 30 tablet 11  . Multiple Vitamin (MULTIVITAMIN) capsule Take 1 capsule by mouth daily. Take 1 tab daily     No current facility-administered medications for this visit.    Allergies  Allergen Reactions  . Demerol [Meperidine] Shortness Of Breath    Stop breathing  . Hydrocodone-Acetaminophen Shortness Of Breath    REACTION: stop breathing  . Meperidine Hcl Shortness Of Breath    REACTION: stop breathing  . Vicodin [Hydrocodone-Acetaminophen] Shortness Of Breath    Stop breathing  . Ace Inhibitors Cough  . Codeine     REACTION:  hallucination, confused  . Fentanyl   . Hydromorphone Hcl     REACTION: itching, rash, nausea, vomiting  . Morphine     REACTION: vomiting  . Naproxen     REACTION: stomach lesion  . Dilaudid [Hydromorphone Hcl] Itching, Nausea And Vomiting and Rash    History   Social History  . Marital Status: Married    Spouse Name: N/A  . Number of Children: N/A  . Years of Education: N/A   Occupational History  . Housewife    Social History Main Topics  . Smoking status: Never Smoker   . Smokeless tobacco: Never Used  . Alcohol Use: No  . Drug Use: No  . Sexual Activity: Yes    Birth Control/ Protection: Surgical   Other Topics Concern  .  Not on file   Social History Narrative     Review of Systems: General: negative for chills, fever, night sweats or weight changes.  Cardiovascular: negative for chest pain, dyspnea on exertion, edema, orthopnea, palpitations, paroxysmal nocturnal dyspnea or shortness of breath Dermatological: negative for rash Respiratory: negative for cough or wheezing Urologic: negative for hematuria Abdominal: negative for nausea, vomiting, diarrhea, bright red blood per rectum, melena, or hematemesis Neurologic: negative for visual changes, syncope, or dizziness All other systems reviewed and are otherwise negative except as noted above.    Blood pressure 146/80, pulse 116, height 5\' 2"  (1.575 m), weight 220 lb 12.8 oz (100.154 kg).  General appearance: alert and no distress Neck: no adenopathy, no carotid bruit, no JVD, supple, symmetrical, trachea midline and thyroid not enlarged, symmetric, no tenderness/mass/nodules Lungs: clear to auscultation bilaterally Heart: regular rate and rhythm, S1, S2 normal, no murmur, click, rub or gallop Extremities: extremities normal, atraumatic, no cyanosis or edema  EKG not performed today  ASSESSMENT AND PLAN:   Syncope History of syncope thought to be related to orthostasis. She saw Dr. Crissie Sickles who adjusted her medications.   Essential hypertension History of hypertension blood pressure measured today at 146/80. Her ARB, calcium channel blocker and beta blocker recently discontinued. Blood pressure is higher than it usually is assess her is her heart rate. I'm going to restart a low-dose beta blocker.   Dyslipidemia History of hyperlipidemia on Crestor 20 mg a day followed by her PCP   Chronic renal insufficiency History of chronic renal insufficiency worsened recently probably as a result of radiocontrast nephropathy from her diagnostic cardiac catheterization. Her creatinine peaked at around 2.9 and currently is 2.4. Her ARB was  held.       Lorretta Harp MD FACP,FACC,FAHA, [facility] 07/07/2014 4:19 PM

## 2014-07-07 NOTE — Assessment & Plan Note (Signed)
History of syncope thought to be related to orthostasis. She saw Dr. Crissie Sickles who adjusted her medications.

## 2014-07-07 NOTE — Assessment & Plan Note (Signed)
History of hypertension blood pressure measured today at 146/80. Her ARB, calcium channel blocker and beta blocker recently discontinued. Blood pressure is higher than it usually is assess her is her heart rate. I'm going to restart a low-dose beta blocker.

## 2014-07-07 NOTE — Assessment & Plan Note (Signed)
History of chronic renal insufficiency worsened recently probably as a result of radiocontrast nephropathy from her diagnostic cardiac catheterization. Her creatinine peaked at around 2.9 and currently is 2.4. Her ARB was held.

## 2014-07-07 NOTE — Assessment & Plan Note (Signed)
History of hyperlipidemia on Crestor 20 mg a day followed by her PCP 

## 2014-07-07 NOTE — Patient Instructions (Signed)
We request that you follow-up in: 4-6 weeks with an extender Cecilie Kicks NP) and in 6 months with Dr Andria Rhein will receive a reminder letter in the mail two months in advance. If you don't receive a letter, please call our office to schedule the follow-up appointment.   Start Atenolol 50mg  daily

## 2014-07-20 ENCOUNTER — Ambulatory Visit (INDEPENDENT_AMBULATORY_CARE_PROVIDER_SITE_OTHER): Payer: Medicare Other | Admitting: Psychiatry

## 2014-07-20 ENCOUNTER — Encounter (HOSPITAL_COMMUNITY): Payer: Self-pay | Admitting: Psychiatry

## 2014-07-20 VITALS — BP 126/73 | HR 69 | Ht 62.0 in | Wt 217.6 lb

## 2014-07-20 DIAGNOSIS — F32A Depression, unspecified: Secondary | ICD-10-CM | POA: Insufficient documentation

## 2014-07-20 DIAGNOSIS — F329 Major depressive disorder, single episode, unspecified: Secondary | ICD-10-CM | POA: Insufficient documentation

## 2014-07-20 MED ORDER — ZOLPIDEM TARTRATE ER 12.5 MG PO TBCR: 12.5000 mg | EXTENDED_RELEASE_TABLET | Freq: Every evening | ORAL | Status: DC | PRN
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
## 2014-07-22 DIAGNOSIS — N189 Chronic kidney disease, unspecified: Secondary | ICD-10-CM | POA: Diagnosis not present

## 2014-07-22 DIAGNOSIS — E119 Type 2 diabetes mellitus without complications: Secondary | ICD-10-CM | POA: Diagnosis not present

## 2014-07-22 DIAGNOSIS — Z79899 Other long term (current) drug therapy: Secondary | ICD-10-CM | POA: Diagnosis not present

## 2014-07-22 DIAGNOSIS — E1142 Type 2 diabetes mellitus with diabetic polyneuropathy: Secondary | ICD-10-CM | POA: Diagnosis not present

## 2014-07-29 DIAGNOSIS — N184 Chronic kidney disease, stage 4 (severe): Secondary | ICD-10-CM | POA: Diagnosis not present

## 2014-07-29 DIAGNOSIS — I10 Essential (primary) hypertension: Secondary | ICD-10-CM | POA: Diagnosis not present

## 2014-07-29 DIAGNOSIS — E1129 Type 2 diabetes mellitus with other diabetic kidney complication: Secondary | ICD-10-CM | POA: Diagnosis not present

## 2014-08-06 DIAGNOSIS — Z905 Acquired absence of kidney: Secondary | ICD-10-CM | POA: Diagnosis not present

## 2014-08-09 ENCOUNTER — Other Ambulatory Visit: Payer: Self-pay

## 2014-08-17 ENCOUNTER — Ambulatory Visit (HOSPITAL_COMMUNITY): Payer: Self-pay | Admitting: Psychiatry

## 2014-08-18 ENCOUNTER — Ambulatory Visit (INDEPENDENT_AMBULATORY_CARE_PROVIDER_SITE_OTHER): Payer: Medicare Other | Admitting: Cardiology

## 2014-08-18 ENCOUNTER — Encounter: Payer: Self-pay | Admitting: Cardiology

## 2014-08-18 VITALS — BP 128/72 | HR 68 | Ht 62.0 in | Wt 210.0 lb

## 2014-08-18 DIAGNOSIS — R55 Syncope and collapse: Secondary | ICD-10-CM

## 2014-08-18 DIAGNOSIS — I10 Essential (primary) hypertension: Secondary | ICD-10-CM | POA: Diagnosis not present

## 2014-08-18 DIAGNOSIS — E785 Hyperlipidemia, unspecified: Secondary | ICD-10-CM

## 2014-08-18 NOTE — Progress Notes (Signed)
Cardiology Office Note   Date:  08/18/2014   ID:  Margaret Huerta, DOB 04/03/1945, MRN [id number]  PCP:  [name], MD  Cardiologist:  Dr. Gwenlyn Found    Chief Complaint  Patient presents with  . Hypertension    c/o no chest pain, no swelling in legs, No SOB, and has been light headed and dizziness      History of Present Illness: Margaret Huerta is a 69 y.o. female who presents for HTN follow up and orthostasis.  She has a history of hypertension, hyperlipidemia, type 2 diabetes, and obstructive sleep apnea (noncompliant with CPAP with a strong family history for heart disease, remote renal cell carcinoma s/p right nephrectomy, chronic renal insufficieny (baseline Scr 1.7), admitted for pre-cath hydration with plans for LHC due to recent high risk NST and recent h/o syncope and chest pain. She had cardiac catheterization performed by Dr. Einar Gip 2003 revealing normal coronary arteries. She recently had a high risk nuclear stress test and was admitted for hydration prior to her cath which Dr. Adora Fridge  performed on 05/27/14 was revealed normal coronary arteries and normal LV function. She saw Dr. Crissie Sickles for evaluation syncope which she thought was related to orthostasis and adjusted her anti-hypertensive medications.  Her last visit her BP was 146/80 and her BB was resumed at 50 mg BID.  She does have a hx of migraines and BB has helped.  Today occ. Lightheadedness.  No chest pain.  No SOB.  She does have episodes of after meals of feeling hot and flushed, nausea and then diarrhea, she has had chole.  Asked her to see GI.     Past Medical History  Diagnosis Date  . Hyperlipidemia   . Hypertension   . Neuropathy   . Osteoporosis   . Fibromyalgia   . Depression   . Hx of cardiovascular stress test April 2016    false positive Myoview  . Pneumonia "several times"  . Chronic bronchitis     "get it q yr"  . Sleep apnea     "wore mask for 2 months; could not take it" (05/26/2014)  .  Parathyroid disease     "my PHT levels run high; I can't take calcium"  . Type II diabetes mellitus   . Anemia   . History of blood transfusion     "S/P tonsillectomy"  . History of hiatal hernia   . GERD (gastroesophageal reflux disease)   . Migraine     "monthly" (05/26/2014)  . Febrile seizure     "as a child"  . Typhus fever     "as a child"  . Chronic lower back pain   . Gout   . Anxiety   . Renal insufficiency     "left kidney works at 40-60%" (05/26/2014)  . Cervical cancer   . Renal cancer     "right"-s/p nephrectomy    Past Surgical History  Procedure Laterality Date  . Nephrectomy Right 2002    "cancer"  . Back surgery    . Appendectomy    . Tmj arthroplasty  1988  . Dilation and curettage of uterus    . Tonsillectomy  1976  . Incontinence surgery  1983  . Cystoscopy w/ [place] manipulation  1988  . Mouth surgery  1991    "drilled into gum and put tooth implants uppers"  . Upper gi endoscopy    . Knee arthroscopy Left 2000's  . Gastric bypass  2012  . Cardiac catheterization  2003, April 2016    normal coronaries  . Eye surgery    . Laparoscopic cholecystectomy  1990's  . Hernia repair    . Abdominal hernia repair  02/2013    "in [place]"  . Lumbar disc surgery  2001    "herniated discs"  . Abdominal hysterectomy  1977    "partial"  . Bilateral salpingoophorectomy Bilateral ~ 1993  . Hiatal hernia repair  1996  . Diagnostic laparoscopy  1975  . Esophagogastroduodenoscopy (egd) with esophageal dilation  1997  . Cataract extraction w/ intraocular lens  implant, bilateral  2015  . Left heart catheterization with coronary angiogram N/A 05/27/2014    Procedure: LEFT HEART CATHETERIZATION WITH CORONARY ANGIOGRAM;  Surgeon: Lorretta Harp, MD;  Location: [facility] CATH LAB;  Service: Cardiovascular;  Laterality: N/A;     Current Outpatient Prescriptions  Medication Sig Dispense Refill  . acetaminophen (TYLENOL) 325 MG tablet Take 2 tablets (650 mg total) by  mouth every 4 (four) hours as needed for headache or mild pain.    Marland Kitchen aspirin 81 MG tablet Take 81 mg by mouth daily.    Marland Kitchen atenolol (TENORMIN) 50 MG tablet Take 1 tablet (50 mg total) by mouth daily. 30 tablet 11  . cholecalciferol (VITAMIN D) 1000 UNITS tablet Take 1,000 Units by mouth daily.    . diphenhydrAMINE (BENADRYL) 25 mg capsule Take 25 mg by mouth 2 (two) times daily as needed.    . docusate sodium (COLACE) 100 MG capsule Take 100 mg by mouth daily.    . DULoxetine (CYMBALTA) 60 MG capsule Take 60 mg by mouth daily.      . furosemide (LASIX) 40 MG tablet Take 0.5 tablets (20 mg total) by mouth daily. Take 40 mg in the AM and 20 mg in the PM (Patient taking differently: Take by mouth. Take 40 mg in the AM and 20 mg in the PM) 30 tablet   . insulin aspart (NOVOLOG) 100 UNIT/ML injection Inject 5-10 Units into the skin 3 (three) times daily before meals.     . insulin glargine (LANTUS) 100 UNIT/ML injection Inject 50 Units into the skin at bedtime.     . Multiple Vitamin (MULTIVITAMIN) capsule Take 1 capsule by mouth daily. Take 1 tab daily    . NITROSTAT 0.4 MG SL tablet Place 0.4 mg under the tongue as needed.    . rosuvastatin (CRESTOR) 20 MG tablet Take 20 mg by mouth daily.      Marland Kitchen telmisartan (MICARDIS) 80 MG tablet Take 1 tablet (80 mg total) by mouth daily.    . vitamin B-12 (CYANOCOBALAMIN) 1000 MCG tablet Take 1,000 mcg by mouth daily.      Marland Kitchen zolpidem (AMBIEN CR) 12.5 MG CR tablet Take 1 tablet (12.5 mg total) by mouth at bedtime as needed for sleep. 30 tablet 2   No current facility-administered medications for this visit.    Allergies:   Demerol; Hydrocodone-acetaminophen; Meperidine hcl; Vicodin; Ace inhibitors; Codeine; Fentanyl; Hydromorphone hcl; Morphine; Naproxen; and Dilaudid    Social History:  The patient  reports that she has never smoked. She has never used smokeless tobacco. She reports that she does not drink alcohol or use illicit drugs.   Family History:  The  patient's family history includes Alcohol abuse in her maternal uncle; Allergies in an other family member; Clotting disorder in her mother; Diabetes in her mother; Emphysema in her father; Heart disease in her father; Kidney disease in her mother; Lung cancer in her maternal  uncle.    ROS:  General:no colds or fevers, + weight loss Skin:no rashes or ulcers HEENT:no blurred vision, no congestion CV:see HPI PUL:see HPI GI:occ diarrhea after meals, no constipation or melena, no indigestion GU:no hematuria, no dysuria MS:no joint pain, no claudication Neuro:no syncope, + lightheadedness Endo:+ diabetes- controlled.  no thyroid disease  Wt Readings from Last 3 Encounters:  08/18/14 210 lb (95.255 kg)  07/20/14 217 lb 9.6 oz (98.703 kg)  07/07/14 220 lb 12.8 oz (100.154 kg)     PHYSICAL EXAM: VS:  BP 128/72 mmHg  Pulse 68  Ht 5\' 2"  (1.575 m)  Wt 210 lb (95.255 kg)  BMI 38.40 kg/m2 , BMI Body mass index is 38.4 kg/(m^2). General:Pleasant affect, NAD Skin:Warm and dry, brisk capillary refill HEENT:normocephalic, sclera clear, mucus membranes moist Neck:supple, no JVD, no bruits  Heart:S1S2 RRR without murmur, gallup, rub or click Lungs:clear without rales, rhonchi, or wheezes [id number], non tender, + BS, do not palpate liver spleen or masses Ext:no lower ext edema, 2+ pedal pulses, 2+ radial pulses Neuro:alert and oriented X 3, MAE, follows commands, + facial symmetry  Lying  124/75 P 67 Sitting 121/69  P 64 Standing 96/67  p 71  EKG:  EKG is NOT ordered today.    Recent Labs: 05/12/2014: TSH 1.80 05/27/2014: Hemoglobin 10.7*; Platelets 201 07/05/2014: BUN 42*; Creat 2.40*; Potassium 3.7; Sodium 138    Lipid Panel No results found for: CHOL, TRIG, HDL, CHOLHDL, VLDL, LDLCALC, LDLDIRECT     Other studies Reviewed: Additional studies/ records that were reviewed today include: cath report. .   ASSESSMENT AND PLAN:  Syncope History of syncope thought to be related to  orthostasis. She saw Dr. Crissie Sickles who adjusted her medications. Today with addition of BB some drop in BP.  Would not increase BP meds at this time.  Asked her to stay hydrated for the summer.  Lean against chair or wall with standing.    Essential hypertension History of hypertension blood pressure measured today at 146/80. Her ARB, calcium channel blocker and beta blocker had been discontinued. Blood pressure was higher so BB resumed with orthostasis today no increase of BP meds.  She may need to run a little higher. Follow up with Dr. Adora Fridge in 5 mnths.   Dyslipidemia History of hyperlipidemia on Crestor 20 mg a day followed by her PCP   Chronic renal insufficiency History of chronic renal insufficiency worsened recently probably as a result of radiocontrast nephropathy from her diagnostic cardiac catheterization. Her creatinine peaked at around 2.9 and currently is 2.4. Her ARB was held.      Current medicines are reviewed with the patient today.  The patient Has no concerns regarding medicines.  The following changes have been made:  See above Labs/ tests ordered today include:see above  Disposition:   FU:  see above  Signed, Isaiah Serge, NP  08/18/2014 4:08 PM    [facility] [place], [place] [place] [place], [place] Phone: [phone]; Fax: [phone]

## 2014-08-18 NOTE — Patient Instructions (Addendum)
Your physician recommends that you schedule a follow-up appointment in: 5 months with Dr. Gwenlyn Found. No changes have been made today in your therapy.

## 2014-08-31 ENCOUNTER — Ambulatory Visit (HOSPITAL_COMMUNITY): Payer: Self-pay | Admitting: Psychiatry

## 2014-09-17 ENCOUNTER — Encounter (INDEPENDENT_AMBULATORY_CARE_PROVIDER_SITE_OTHER): Payer: Self-pay | Admitting: *Deleted

## 2014-10-07 ENCOUNTER — Encounter: Payer: Self-pay | Admitting: Internal Medicine

## 2014-10-07 ENCOUNTER — Ambulatory Visit (INDEPENDENT_AMBULATORY_CARE_PROVIDER_SITE_OTHER): Payer: Medicare Other | Admitting: Internal Medicine

## 2014-10-07 VITALS — BP 130/74 | HR 56 | Ht 62.0 in | Wt 219.0 lb

## 2014-10-07 DIAGNOSIS — I10 Essential (primary) hypertension: Secondary | ICD-10-CM

## 2014-10-07 DIAGNOSIS — R55 Syncope and collapse: Secondary | ICD-10-CM | POA: Diagnosis not present

## 2014-10-07 NOTE — Progress Notes (Signed)
HPI [name] returns today for followup of syncope. She is a pleasant 69 yo woman with a h/o orthostasis and autonomic dysfunction who I saw several weeks ago. She has had her medication adjusted. Her symptoms have improved. She has had no additional syncope. She still gets dizzy if she tries to get up too quickly. She has lost weight and is trying to lose more. Overall she is better.  Allergies  Allergen Reactions  . Demerol [Meperidine] Shortness Of Breath    Stop breathing  . Hydrocodone-Acetaminophen Shortness Of Breath    REACTION: stop breathing  . Meperidine Hcl Shortness Of Breath    REACTION: stop breathing  . Vicodin [Hydrocodone-Acetaminophen] Shortness Of Breath    Stop breathing  . Ace Inhibitors Cough  . Codeine     REACTION: hallucination, confused  . Fentanyl   . Hydromorphone Hcl     REACTION: itching, rash, nausea, vomiting  . Morphine     REACTION: vomiting  . Naproxen     REACTION: stomach lesion  . Dilaudid [Hydromorphone Hcl] Itching, Nausea And Vomiting and Rash     Current Outpatient Prescriptions  Medication Sig Dispense Refill  . acetaminophen (TYLENOL) 325 MG tablet Take 2 tablets (650 mg total) by mouth every 4 (four) hours as needed for headache or mild pain.    Marland Kitchen aspirin 81 MG tablet Take 81 mg by mouth daily.    Marland Kitchen atenolol (TENORMIN) 50 MG tablet Take 1 tablet (50 mg total) by mouth daily. 30 tablet 11  . cholecalciferol (VITAMIN D) 1000 UNITS tablet Take 1,000 Units by mouth daily.    . diphenhydrAMINE (BENADRYL) 25 mg capsule Take 25 mg by mouth 2 (two) times daily as needed.    . docusate sodium (COLACE) 100 MG capsule Take 100 mg by mouth daily.    . DULoxetine (CYMBALTA) 60 MG capsule Take 60 mg by mouth daily.      . furosemide (LASIX) 40 MG tablet Take 0.5 tablets (20 mg total) by mouth daily. Take 40 mg in the AM and 20 mg in the PM (Patient taking differently: Take by mouth. Take 40 mg in the AM and 20 mg in the PM) 30 tablet   .  insulin aspart (NOVOLOG) 100 UNIT/ML injection Inject 5-10 Units into the skin 3 (three) times daily before meals.     . insulin glargine (LANTUS) 100 UNIT/ML injection Inject 50 Units into the skin at bedtime.     . Multiple Vitamin (MULTIVITAMIN) capsule Take 1 capsule by mouth daily. Take 1 tab daily    . NITROSTAT 0.4 MG SL tablet Place 0.4 mg under the tongue as needed.    . rosuvastatin (CRESTOR) 20 MG tablet Take 20 mg by mouth daily.      Marland Kitchen telmisartan (MICARDIS) 80 MG tablet Take 1 tablet (80 mg total) by mouth daily.    . vitamin B-12 (CYANOCOBALAMIN) 1000 MCG tablet Take 1,000 mcg by mouth daily.       No current facility-administered medications for this visit.     Past Medical History  Diagnosis Date  . Hyperlipidemia   . Hypertension   . Neuropathy   . Osteoporosis   . Fibromyalgia   . Depression   . Hx of cardiovascular stress test April 2016    false positive Myoview  . Pneumonia "several times"  . Chronic bronchitis     "get it q yr"  . Sleep apnea     "wore mask for 2  months; could not take it" (05/26/2014)  . Parathyroid disease     "my PHT levels run high; I can't take calcium"  . Type II diabetes mellitus   . Anemia   . History of blood transfusion     "S/P tonsillectomy"  . History of hiatal hernia   . GERD (gastroesophageal reflux disease)   . Migraine     "monthly" (05/26/2014)  . Febrile seizure     "as a child"  . Typhus fever     "as a child"  . Chronic lower back pain   . Gout   . Anxiety   . Renal insufficiency     "left kidney works at 40-60%" (05/26/2014)  . Cervical cancer   . Renal cancer     "right"-s/p nephrectomy    ROS:   All systems reviewed and negative except as noted in the HPI.   Past Surgical History  Procedure Laterality Date  . Nephrectomy Right 2002    "cancer"  . Back surgery    . Appendectomy    . Tmj arthroplasty  1988  . Dilation and curettage of uterus    . Tonsillectomy  1976  . Incontinence surgery   1983  . Cystoscopy w/ [place] manipulation  1988  . Mouth surgery  1991    "drilled into gum and put tooth implants uppers"  . Upper gi endoscopy    . Knee arthroscopy Left 2000's  . Gastric bypass  2012  . Cardiac catheterization  2003, April 2016    normal coronaries  . Eye surgery    . Laparoscopic cholecystectomy  1990's  . Hernia repair    . Abdominal hernia repair  02/2013    "in [place]"  . Lumbar disc surgery  2001    "herniated discs"  . Abdominal hysterectomy  1977    "partial"  . Bilateral salpingoophorectomy Bilateral ~ 1993  . Hiatal hernia repair  1996  . Diagnostic laparoscopy  1975  . Esophagogastroduodenoscopy (egd) with esophageal dilation  1997  . Cataract extraction w/ intraocular lens  implant, bilateral  2015  . Left heart catheterization with coronary angiogram N/A 05/27/2014    Procedure: LEFT HEART CATHETERIZATION WITH CORONARY ANGIOGRAM;  Surgeon: Lorretta Harp, MD;  Location: [facility] CATH LAB;  Service: Cardiovascular;  Laterality: N/A;     Family History  Problem Relation Age of Onset  . Emphysema Father   . Heart disease Father   . Allergies      whole family per pt  . Clotting disorder Mother   . Diabetes Mother   . Kidney disease Mother   . Lung cancer Maternal Uncle   . Alcohol abuse Maternal Uncle      Social History   Social History  . Marital Status: Married    Spouse Name: N/A  . Number of Children: N/A  . Years of Education: N/A   Occupational History  . Housewife    Social History Main Topics  . Smoking status: Never Smoker   . Smokeless tobacco: Never Used  . Alcohol Use: No  . Drug Use: No  . Sexual Activity: Yes    Birth Control/ Protection: Surgical   Other Topics Concern  . Not on file   Social History Narrative     BP 130/74 mmHg  Pulse 56  Ht 5\' 2"  (1.575 m)  Wt 219 lb (99.338 kg)  BMI 40.05 kg/m2  SpO2 97%  Physical Exam:  Well appearing 69 yo woman, NAD HEENT: Unremarkable Neck:  6 cm JVD, no  thyromegally Back:  No CVA tenderness Lungs:  Clear with no wheezes HEART:  Regular rate rhythm, no murmurs, no rubs, no clicks Abd:  soft, positive bowel sounds, no organomegally, no rebound, no guarding Ext:  2 plus pulses, no edema, no cyanosis, no clubbing Skin:  No rashes no nodules Neuro:  CN II through XII intact, motor grossly intact   Assess/Plan:

## 2014-10-07 NOTE — Assessment & Plan Note (Signed)
Weight loss has been discussed. I have encouraged the patient to increase her physical activity.

## 2014-10-07 NOTE — Patient Instructions (Signed)
Your physician recommends that you schedule a follow-up appointment in: As needed with Dr. [name].  Your physician recommends that you continue on your current medications as directed. Please refer to the Current Medication list given to you today.  Your physician discussed the importance of regular exercise and recommended that you start or continue a regular exercise program for good health.  Thank you for choosing [facility]!

## 2014-10-07 NOTE — Assessment & Plan Note (Signed)
She is stable and improved. I discussed the possibility of worsening over time. She is encouraged to increase her physical activity.

## 2014-10-07 NOTE — Assessment & Plan Note (Signed)
Her blood pressure is actually better. She will continue her current meds.

## 2014-10-22 DIAGNOSIS — Z794 Long term (current) use of insulin: Secondary | ICD-10-CM | POA: Diagnosis not present

## 2014-10-22 DIAGNOSIS — Z905 Acquired absence of kidney: Secondary | ICD-10-CM | POA: Diagnosis not present

## 2014-10-22 DIAGNOSIS — I951 Orthostatic hypotension: Secondary | ICD-10-CM | POA: Diagnosis not present

## 2014-10-22 DIAGNOSIS — D649 Anemia, unspecified: Secondary | ICD-10-CM | POA: Diagnosis not present

## 2014-10-22 DIAGNOSIS — E559 Vitamin D deficiency, unspecified: Secondary | ICD-10-CM | POA: Diagnosis not present

## 2014-10-22 DIAGNOSIS — E1121 Type 2 diabetes mellitus with diabetic nephropathy: Secondary | ICD-10-CM | POA: Diagnosis not present

## 2014-10-22 DIAGNOSIS — E1122 Type 2 diabetes mellitus with diabetic chronic kidney disease: Secondary | ICD-10-CM | POA: Diagnosis not present

## 2014-10-22 DIAGNOSIS — I129 Hypertensive chronic kidney disease with stage 1 through stage 4 chronic kidney disease, or unspecified chronic kidney disease: Secondary | ICD-10-CM | POA: Diagnosis not present

## 2014-10-22 DIAGNOSIS — Z9884 Bariatric surgery status: Secondary | ICD-10-CM | POA: Diagnosis not present

## 2014-10-22 DIAGNOSIS — Z85528 Personal history of other malignant neoplasm of kidney: Secondary | ICD-10-CM | POA: Diagnosis not present

## 2014-10-22 DIAGNOSIS — N189 Chronic kidney disease, unspecified: Secondary | ICD-10-CM | POA: Diagnosis not present

## 2014-10-25 DIAGNOSIS — Z79899 Other long term (current) drug therapy: Secondary | ICD-10-CM | POA: Diagnosis not present

## 2014-10-25 DIAGNOSIS — E119 Type 2 diabetes mellitus without complications: Secondary | ICD-10-CM | POA: Diagnosis not present

## 2014-10-25 DIAGNOSIS — E785 Hyperlipidemia, unspecified: Secondary | ICD-10-CM | POA: Diagnosis not present

## 2014-10-25 DIAGNOSIS — M109 Gout, unspecified: Secondary | ICD-10-CM | POA: Diagnosis not present

## 2014-10-26 ENCOUNTER — Other Ambulatory Visit (INDEPENDENT_AMBULATORY_CARE_PROVIDER_SITE_OTHER): Payer: Self-pay | Admitting: Internal Medicine

## 2014-10-26 ENCOUNTER — Telehealth (INDEPENDENT_AMBULATORY_CARE_PROVIDER_SITE_OTHER): Payer: Self-pay | Admitting: *Deleted

## 2014-10-26 ENCOUNTER — Encounter (INDEPENDENT_AMBULATORY_CARE_PROVIDER_SITE_OTHER): Payer: Self-pay | Admitting: Internal Medicine

## 2014-10-26 ENCOUNTER — Ambulatory Visit (INDEPENDENT_AMBULATORY_CARE_PROVIDER_SITE_OTHER): Payer: Medicare Other | Admitting: Internal Medicine

## 2014-10-26 VITALS — BP 128/70 | HR 80 | Temp 97.5°F | Ht 62.0 in | Wt 207.8 lb

## 2014-10-26 DIAGNOSIS — R197 Diarrhea, unspecified: Secondary | ICD-10-CM

## 2014-10-26 DIAGNOSIS — Z86010 Personal history of colonic polyps: Secondary | ICD-10-CM | POA: Diagnosis not present

## 2014-10-26 DIAGNOSIS — R110 Nausea: Secondary | ICD-10-CM

## 2014-10-26 DIAGNOSIS — R634 Abnormal weight loss: Secondary | ICD-10-CM

## 2014-10-26 DIAGNOSIS — K219 Gastro-esophageal reflux disease without esophagitis: Secondary | ICD-10-CM

## 2014-10-26 MED ORDER — PEG 3350-KCL-NA BICARB-NACL 420 G PO SOLR: 4000.0000 mL | Freq: Once | ORAL | Status: DC

## 2014-10-26 NOTE — Telephone Encounter (Signed)
Patient needs trilyte 

## 2014-10-26 NOTE — Progress Notes (Signed)
Subjective:    Patient ID: Margaret Huerta, female    DOB: 1945/11/16, 69 y.o.   MRN: [id number]  HPI Referred to our office by Dr. Willey Blade for nausea/diarrhea She says after she eats she will have a hot flash and then she will go to the bathroom and have a BM She has had these symptoms since May.  She says this occurs at every meal. She is having 5-6 stools a day and they are always soft to loose. Before May, she was having 2-4 a day and were soft. She has not seen any melena or BRRB.  She also c/o pain rt upper quadrant and some lower abdominal pain.  She says she has frequent nausea. She says she has lost 40 pounds since December which was unintentional. She had gastric bypass in [place] for weight loss. She does have some acid reflux. She takes Tums prn for acid reflux. Her last colonoscopy was in 2011 (screening). Exam to cecum. Small polyp ablated via cold biopsy from proximal transverse colon. Few small diverticula at sigmoid colon and internal hemorrhoids. Biopsy: Sessile serated adenoma.  Hemoglobin 10.7 05/28/2014. Hx of reflux surgery 1996. Her appetite is okay. Hx of diabetes.  CBC    Component Value Date/Time   WBC 4.9 05/27/2014 0503   RBC 4.09 05/27/2014 0503   HGB 10.7* 05/27/2014 0503   HCT 34.0* 05/27/2014 0503   PLT 201 05/27/2014 0503   MCV 83.1 05/27/2014 0503   MCH 26.2 05/27/2014 0503   MCHC 31.5 05/27/2014 0503   RDW 16.1* 05/27/2014 0503   LYMPHSABS 2.2 05/24/2014 1231   MONOABS 0.5 05/24/2014 1231   EOSABS 0.2 05/24/2014 1231   BASOSABS 0.0 05/24/2014 1231    BMET    Component Value Date/Time   NA 138 07/05/2014 1110   K 3.7 07/05/2014 1110   CL 104 07/05/2014 1110   CO2 25 07/05/2014 1110   GLUCOSE 112* 07/05/2014 1110   BUN 42* 07/05/2014 1110   CREATININE 2.40* 07/05/2014 1110   CREATININE 1.35* 05/27/2014 0503   CALCIUM 9.8 07/05/2014 1110   GFRNONAA 16* 06/22/2014 1209   GFRNONAA 40* 05/27/2014 0503   GFRAA 19* 06/22/2014 1209   GFRAA 46*  05/27/2014 0503     Review of Systems Past Medical History  Diagnosis Date  . Hyperlipidemia   . Hypertension   . Neuropathy   . Osteoporosis   . Fibromyalgia   . Depression   . Hx of cardiovascular stress test April 2016    false positive Myoview  . Pneumonia "several times"  . Chronic bronchitis     "get it q yr"  . Sleep apnea     "wore mask for 2 months; could not take it" (05/26/2014)  . Parathyroid disease     "my PHT levels run high; I can't take calcium"  . Type II diabetes mellitus   . Anemia   . History of blood transfusion     "S/P tonsillectomy"  . History of hiatal hernia   . GERD (gastroesophageal reflux disease)   . Migraine     "monthly" (05/26/2014)  . Febrile seizure     "as a child"  . Typhus fever     "as a child"  . Chronic lower back pain   . Gout   . Anxiety   . Renal insufficiency     "left kidney works at 40-60%" (05/26/2014)  . Cervical cancer   . Renal cancer     "right"-s/p nephrectomy  Past Surgical History  Procedure Laterality Date  . Nephrectomy Right 2002    "cancer"  . Back surgery    . Appendectomy    . Tmj arthroplasty  1988  . Dilation and curettage of uterus    . Tonsillectomy  1976  . Incontinence surgery  1983  . Cystoscopy w/ [place] manipulation  1988  . Mouth surgery  1991    "drilled into gum and put tooth implants uppers"  . Upper gi endoscopy    . Knee arthroscopy Left 2000's  . Gastric bypass  2012  . Cardiac catheterization  2003, April 2016    normal coronaries  . Eye surgery    . Laparoscopic cholecystectomy  1990's  . Hernia repair    . Abdominal hernia repair  02/2013    "in [place]"  . Lumbar disc surgery  2001    "herniated discs"  . Abdominal hysterectomy  1977    "partial"  . Bilateral salpingoophorectomy Bilateral ~ 1993  . Hiatal hernia repair  1996  . Diagnostic laparoscopy  1975  . Esophagogastroduodenoscopy (egd) with esophageal dilation  1997  . Cataract extraction w/ intraocular  lens  implant, bilateral  2015  . Left heart catheterization with coronary angiogram N/A 05/27/2014    Procedure: LEFT HEART CATHETERIZATION WITH CORONARY ANGIOGRAM;  Surgeon: Lorretta Harp, MD;  Location: [facility] CATH LAB;  Service: Cardiovascular;  Laterality: N/A;    Allergies  Allergen Reactions  . Demerol [Meperidine] Shortness Of Breath    Stop breathing  . Hydrocodone-Acetaminophen Shortness Of Breath    REACTION: stop breathing  . Meperidine Hcl Shortness Of Breath    REACTION: stop breathing  . Vicodin [Hydrocodone-Acetaminophen] Shortness Of Breath    Stop breathing  . Ace Inhibitors Cough  . Codeine     REACTION: hallucination, confused  . Fentanyl   . Hydromorphone Hcl     REACTION: itching, rash, nausea, vomiting  . Morphine     REACTION: vomiting  . Naproxen     REACTION: stomach lesion  . Dilaudid [Hydromorphone Hcl] Itching, Nausea And Vomiting and Rash    Current Outpatient Prescriptions on File Prior to Visit  Medication Sig Dispense Refill  . acetaminophen (TYLENOL) 325 MG tablet Take 2 tablets (650 mg total) by mouth every 4 (four) hours as needed for headache or mild pain.    Marland Kitchen aspirin 81 MG tablet Take 81 mg by mouth daily.    Marland Kitchen atenolol (TENORMIN) 50 MG tablet Take 1 tablet (50 mg total) by mouth daily. 30 tablet 11  . cholecalciferol (VITAMIN D) 1000 UNITS tablet Take 5,000 Units by mouth daily.     . diphenhydrAMINE (BENADRYL) 25 mg capsule Take 25 mg by mouth 2 (two) times daily as needed.    . docusate sodium (COLACE) 100 MG capsule Take 100 mg by mouth daily.    . DULoxetine (CYMBALTA) 60 MG capsule Take 60 mg by mouth daily.      . furosemide (LASIX) 40 MG tablet Take 0.5 tablets (20 mg total) by mouth daily. Take 40 mg in the AM and 20 mg in the PM (Patient taking differently: Take 40 mg by mouth 2 (two) times daily. Take 40 mg in the AM and 20 mg in the PM) 30 tablet   . insulin aspart (NOVOLOG) 100 UNIT/ML injection Inject 5-10 Units into the skin 3  (three) times daily before meals.     . insulin glargine (LANTUS) 100 UNIT/ML injection Inject 55 Units into the skin at  bedtime.     . rosuvastatin (CRESTOR) 20 MG tablet Take 20 mg by mouth daily.      Marland Kitchen telmisartan (MICARDIS) 80 MG tablet Take 1 tablet (80 mg total) by mouth daily.    . vitamin B-12 (CYANOCOBALAMIN) 1000 MCG tablet Take 1,000 mcg by mouth daily.      . Multiple Vitamin (MULTIVITAMIN) capsule Take 1 capsule by mouth daily. Take 1 tab daily    . NITROSTAT 0.4 MG SL tablet Place 0.4 mg under the tongue as needed.     No current facility-administered medications on file prior to visit.        Objective:   Physical Exam  Blood pressure 128/70, pulse 80, temperature 97.5 F (36.4 C), height 5\' 2"  (1.575 m), weight 207 lb 12.8 oz (94.257 kg). Alert and oriented. Skin warm and dry. Oral mucosa is moist.   . Sclera anicteric, conjunctivae is pink. Thyroid not enlarged. No cervical lymphadenopathy. Lungs clear. Heart regular rate and rhythm.  Abdomen is soft. Bowel sounds are positive. No hepatomegaly. No abdominal masses felt. No tenderness.  No edema to lower extremities.         Assessment & Plan:  Nausea, weight loss, PUD needs to be ruled out.  Diarrhea, change in her stool. Hx of colon polyp. Colonic neoplasm needs to be ruled out. The risks and benefits such as perforation, bleeding, and infection were reviewed with the patient and is agreeable.

## 2014-10-26 NOTE — Patient Instructions (Signed)
EGD/Colonoscopy. The risks and benefits such as perforation, bleeding, and infection were reviewed with the patient and is agreeable. 

## 2014-11-01 DIAGNOSIS — Z6838 Body mass index (BMI) 38.0-38.9, adult: Secondary | ICD-10-CM | POA: Diagnosis not present

## 2014-11-01 DIAGNOSIS — E785 Hyperlipidemia, unspecified: Secondary | ICD-10-CM | POA: Diagnosis not present

## 2014-11-01 DIAGNOSIS — Z23 Encounter for immunization: Secondary | ICD-10-CM | POA: Diagnosis not present

## 2014-11-01 DIAGNOSIS — E1129 Type 2 diabetes mellitus with other diabetic kidney complication: Secondary | ICD-10-CM | POA: Diagnosis not present

## 2014-11-01 DIAGNOSIS — N184 Chronic kidney disease, stage 4 (severe): Secondary | ICD-10-CM | POA: Diagnosis not present

## 2014-11-05 ENCOUNTER — Encounter (INDEPENDENT_AMBULATORY_CARE_PROVIDER_SITE_OTHER): Payer: Self-pay

## 2014-11-24 NOTE — OR Nursing (Signed)
Patient scheduled for an EGD and TCS with Dr. Laural Golden tomorrow. Patient is allergic to Demerol, Fentanyl and Morphine. Spoke with Dr. Laural Golden who said patient could be done with propofol. Patient had a TCS in 2011 where Fentanyl and Versed were used for sedation. Called and spoke with patient who said that Fentanyl caused itching after last procedure. Offered to do procedure with propofol and patient stated, "I can't take that either." Spoke with Dr. Laural Golden who said he would give Benadryl prior to sedation tomorrow and would proceed with moderate sedation as originally planned. Patient notified and is agreeable.

## 2014-11-25 ENCOUNTER — Ambulatory Visit (HOSPITAL_COMMUNITY)
Admission: RE | Admit: 2014-11-25 | Discharge: 2014-11-25 | Disposition: A | Discharge status: Home / Self Care | Payer: Medicare Other | Source: Ambulatory Visit | Attending: Internal Medicine | Admitting: Internal Medicine

## 2014-11-25 ENCOUNTER — Encounter (HOSPITAL_COMMUNITY): Payer: Self-pay | Admitting: *Deleted

## 2014-11-25 ENCOUNTER — Encounter (HOSPITAL_COMMUNITY)
Admission: RE | Disposition: A | Discharge status: Home / Self Care | Payer: Self-pay | Source: Ambulatory Visit | Attending: Internal Medicine

## 2014-11-25 ENCOUNTER — Encounter (INDEPENDENT_AMBULATORY_CARE_PROVIDER_SITE_OTHER): Payer: Self-pay

## 2014-11-25 DIAGNOSIS — Z905 Acquired absence of kidney: Secondary | ICD-10-CM | POA: Diagnosis not present

## 2014-11-25 DIAGNOSIS — R630 Anorexia: Secondary | ICD-10-CM | POA: Diagnosis not present

## 2014-11-25 DIAGNOSIS — K5730 Diverticulosis of large intestine without perforation or abscess without bleeding: Secondary | ICD-10-CM | POA: Insufficient documentation

## 2014-11-25 DIAGNOSIS — R634 Abnormal weight loss: Secondary | ICD-10-CM | POA: Diagnosis not present

## 2014-11-25 DIAGNOSIS — E119 Type 2 diabetes mellitus without complications: Secondary | ICD-10-CM | POA: Insufficient documentation

## 2014-11-25 DIAGNOSIS — I10 Essential (primary) hypertension: Secondary | ICD-10-CM | POA: Diagnosis not present

## 2014-11-25 DIAGNOSIS — Z8541 Personal history of malignant neoplasm of cervix uteri: Secondary | ICD-10-CM | POA: Diagnosis not present

## 2014-11-25 DIAGNOSIS — Z9884 Bariatric surgery status: Secondary | ICD-10-CM | POA: Diagnosis not present

## 2014-11-25 DIAGNOSIS — Z86010 Personal history of colonic polyps: Secondary | ICD-10-CM | POA: Insufficient documentation

## 2014-11-25 DIAGNOSIS — R110 Nausea: Secondary | ICD-10-CM | POA: Insufficient documentation

## 2014-11-25 DIAGNOSIS — R197 Diarrhea, unspecified: Secondary | ICD-10-CM | POA: Diagnosis not present

## 2014-11-25 DIAGNOSIS — F419 Anxiety disorder, unspecified: Secondary | ICD-10-CM | POA: Diagnosis not present

## 2014-11-25 DIAGNOSIS — Q438 Other specified congenital malformations of intestine: Secondary | ICD-10-CM | POA: Diagnosis not present

## 2014-11-25 DIAGNOSIS — Z980 Intestinal bypass and anastomosis status: Secondary | ICD-10-CM | POA: Diagnosis not present

## 2014-11-25 DIAGNOSIS — K219 Gastro-esophageal reflux disease without esophagitis: Secondary | ICD-10-CM

## 2014-11-25 DIAGNOSIS — D649 Anemia, unspecified: Secondary | ICD-10-CM

## 2014-11-25 DIAGNOSIS — E785 Hyperlipidemia, unspecified: Secondary | ICD-10-CM | POA: Diagnosis not present

## 2014-11-25 DIAGNOSIS — K319 Disease of stomach and duodenum, unspecified: Secondary | ICD-10-CM | POA: Diagnosis not present

## 2014-11-25 DIAGNOSIS — K644 Residual hemorrhoidal skin tags: Secondary | ICD-10-CM | POA: Diagnosis not present

## 2014-11-25 DIAGNOSIS — C649 Malignant neoplasm of unspecified kidney, except renal pelvis: Secondary | ICD-10-CM | POA: Insufficient documentation

## 2014-11-25 HISTORY — PX: COLONOSCOPY: SHX5424

## 2014-11-25 HISTORY — PX: ESOPHAGOGASTRODUODENOSCOPY: SHX5428

## 2014-11-25 LAB — FOLATE: Folate: 28 ng/mL (ref >5.9)

## 2014-11-25 LAB — IRON AND TIBC
Iron: 69 ug/dL (ref 28–170)
SATURATION RATIOS: 17 % (ref 10.4–31.8)
TIBC: 396 ug/dL (ref 250–450)
UIBC: 327 ug/dL

## 2014-11-25 LAB — GLUCOSE, CAPILLARY: GLUCOSE-CAPILLARY: 177 mg/dL — AB (ref 65–99)

## 2014-11-25 LAB — FERRITIN: FERRITIN: 21 ng/mL (ref 11–307)

## 2014-11-25 LAB — VITAMIN B12: Vitamin B-12: 1134 pg/mL — ABNORMAL HIGH (ref 180–914)

## 2014-11-25 SURGERY — EGD (ESOPHAGOGASTRODUODENOSCOPY)
Anesthesia: Moderate Sedation

## 2014-11-25 MED ORDER — STERILE WATER FOR IRRIGATION IR SOLN
Status: DC | PRN
  Administered 2014-11-25: 12:00:00

## 2014-11-25 MED ORDER — SODIUM CHLORIDE 0.9 % IV SOLN
INTRAVENOUS | Status: DC
  Administered 2014-11-25: 11:00:00 via INTRAVENOUS

## 2014-11-25 MED ORDER — MIDAZOLAM HCL 5 MG/5ML IJ SOLN
INTRAMUSCULAR | Status: AC
  Filled 2014-11-25: qty 10

## 2014-11-25 MED ORDER — BUTAMBEN-TETRACAINE-BENZOCAINE 2-2-14 % EX AERO
INHALATION_SPRAY | CUTANEOUS | Status: DC | PRN
  Administered 2014-11-25: 2 via TOPICAL

## 2014-11-25 MED ORDER — FENTANYL CITRATE (PF) 100 MCG/2ML IJ SOLN
INTRAMUSCULAR | Status: DC
Start: 2014-11-25 — End: 2014-11-25
  Filled 2014-11-25: qty 2

## 2014-11-25 MED ORDER — DIPHENHYDRAMINE HCL 50 MG/ML IJ SOLN
INTRAMUSCULAR | Status: AC
  Filled 2014-11-25: qty 1

## 2014-11-25 MED ORDER — MIDAZOLAM HCL 5 MG/5ML IJ SOLN
INTRAMUSCULAR | Status: DC | PRN
  Administered 2014-11-25 (×2): 1 mg via INTRAVENOUS
  Administered 2014-11-25 (×3): 2 mg via INTRAVENOUS
  Administered 2014-11-25: 1 mg via INTRAVENOUS

## 2014-11-25 MED ORDER — DIPHENHYDRAMINE HCL 50 MG/ML IJ SOLN
INTRAMUSCULAR | Status: DC | PRN
  Administered 2014-11-25: 25 mg via INTRAVENOUS

## 2014-11-25 MED ORDER — FENTANYL CITRATE (PF) 100 MCG/2ML IJ SOLN
INTRAMUSCULAR | Status: DC | PRN
  Administered 2014-11-25 (×2): 25 ug via INTRAVENOUS

## 2014-11-25 MED ORDER — DICYCLOMINE HCL 10 MG PO CAPS
10.0000 mg | ORAL_CAPSULE | Freq: Three times a day (TID) | ORAL | Status: DC
Start: 2014-11-25 — End: 2015-06-02

## 2014-11-25 NOTE — H&P (Signed)
Margaret Huerta is an 69 y.o. female.   Chief Complaint: Patient is  here for EGD and colonoscopy. HPI: Patient is 69 year old Caucasian female with multiple medical problems including diabetes mellitus of 26 years duration who presents with postprandial nausea, diaphoresis is an diarrhea. She is having 5-6 bowel movements per day. She denies melena or rectal bleeding. She has not had any episode of vomiting. She has heartburn no more than once or twice a week. She does not have good appetite. She has lost 40 pounds in the last 10 months. She states since she has had these symptoms have A1c has gone up to 9.2. Last colonoscopy was about 5 years ago with removal of single small polyp was sessile serrated polyp. Family history is positive for celiac disease in first cousins negative for CRC. Patient was recently evaluated for syncopal episodes diagnosed with orthostatic hypotension.  Past Medical History  Diagnosis Date  . Hyperlipidemia   . Hypertension   . Neuropathy ([place])   . Osteoporosis   . Fibromyalgia   . Depression   . Hx of cardiovascular stress test April 2016    false positive Myoview  . Pneumonia "several times"  . Chronic bronchitis ([place])     "get it q yr"  . Sleep apnea     "wore mask for 2 months; could not take it" (05/26/2014)  . Parathyroid disease ([place])     "my PHT levels run high; I can't take calcium"  . Type II diabetes mellitus ([place])   . Anemia   . History of blood transfusion     "S/P tonsillectomy"  . History of hiatal hernia   . GERD (gastroesophageal reflux disease)   . Migraine     "monthly" (05/26/2014)  . Febrile seizure ([place])     "as a child"  . Typhus fever     "as a child"  . Chronic lower back pain   . Gout   . Anxiety   . Renal insufficiency     "left kidney works at 40-60%" (05/26/2014)  . Cervical cancer ([place])   . Renal cancer [facility])     "right"-s/p nephrectomy    Past Surgical History  Procedure Laterality Date  . Nephrectomy Right 2002    "cancer"  . Back surgery    . Appendectomy    . Tmj arthroplasty  1988  . Dilation and curettage of uterus    . Tonsillectomy  1976  . Incontinence surgery  1983  . Cystoscopy w/ [place] manipulation  1988  . Mouth surgery  1991    "drilled into gum and put tooth implants uppers"  . Upper gi endoscopy    . Knee arthroscopy Left 2000's  . Gastric bypass  2012  . Cardiac catheterization  2003, April 2016    normal coronaries  . Eye surgery    . Laparoscopic cholecystectomy  1990's  . Hernia repair      2015  . Abdominal hernia repair  02/2013    "in [place]"  . Lumbar disc surgery  2001    "herniated discs"  . Abdominal hysterectomy  1977    "partial"  . Bilateral salpingoophorectomy Bilateral ~ 1993  . Hiatal hernia repair  1996  . Diagnostic laparoscopy  1975  . Esophagogastroduodenoscopy (egd) with esophageal dilation  1997  . Cataract extraction w/ intraocular lens  implant, bilateral  2015  . Left heart catheterization with coronary angiogram N/A 05/27/2014    Procedure: LEFT HEART CATHETERIZATION WITH CORONARY ANGIOGRAM;  Surgeon:  Lorretta Harp, MD;  Location: [facility] CATH LAB;  Service: Cardiovascular;  Laterality: N/A;    Family History  Problem Relation Age of Onset  . Emphysema Father   . Heart disease Father   . Allergies      whole family per pt  . Clotting disorder Mother   . Diabetes Mother   . Kidney disease Mother   . Lung cancer Maternal Uncle   . Alcohol abuse Maternal Uncle    Social History:  reports that she has never smoked. She has never used smokeless tobacco. She reports that she does not drink alcohol or use illicit drugs.  Allergies:  Allergies  Allergen Reactions  . Demerol [Meperidine] Shortness Of Breath    Stop breathing  . Hydrocodone-Acetaminophen Shortness Of Breath    REACTION: stop breathing  . Meperidine Hcl Shortness Of Breath    REACTION: stop breathing  . Vicodin [Hydrocodone-Acetaminophen] Shortness Of Breath    Stop  breathing  . Ace Inhibitors Cough  . Codeine     REACTION: hallucination, confused  . Fentanyl Itching  . Hydromorphone Hcl     REACTION: itching, rash, nausea, vomiting  . Morphine     REACTION: vomiting  . Naproxen     REACTION: stomach lesion  . Dilaudid [Hydromorphone Hcl] Itching, Nausea And Vomiting and Rash    Medications Prior to Admission  Medication Sig Dispense Refill  . acetaminophen (TYLENOL) 325 MG tablet Take 2 tablets (650 mg total) by mouth every 4 (four) hours as needed for headache or mild pain.    Marland Kitchen aspirin 81 MG tablet Take 81 mg by mouth daily.    Marland Kitchen atenolol (TENORMIN) 50 MG tablet Take 1 tablet (50 mg total) by mouth daily. 30 tablet 11  . cholecalciferol (VITAMIN D) 1000 UNITS tablet Take 5,000 Units by mouth daily.     . diphenhydrAMINE (BENADRYL) 25 mg capsule Take 25 mg by mouth 2 (two) times daily as needed.    . docusate sodium (COLACE) 100 MG capsule Take 100 mg by mouth daily.    . DULoxetine (CYMBALTA) 60 MG capsule Take 60 mg by mouth daily.      . furosemide (LASIX) 40 MG tablet Take 0.5 tablets (20 mg total) by mouth daily. Take 40 mg in the AM and 20 mg in the PM (Patient taking differently: Take 40 mg by mouth 2 (two) times daily. Take 40 mg in the AM and 20 mg in the PM) 30 tablet   . insulin aspart (NOVOLOG) 100 UNIT/ML injection Inject 5-10 Units into the skin 3 (three) times daily before meals.     . insulin glargine (LANTUS) 100 UNIT/ML injection Inject 55 Units into the skin at bedtime.     . metFORMIN (GLUCOPHAGE) 500 MG tablet Take 500 mg by mouth daily with breakfast.     . polyethylene glycol-electrolytes (NULYTELY/GOLYTELY) 420 G solution Take 4,000 mLs by mouth once. 4000 mL 0  . rosuvastatin (CRESTOR) 20 MG tablet Take 20 mg by mouth daily.      Marland Kitchen telmisartan (MICARDIS) 80 MG tablet Take 1 tablet (80 mg total) by mouth daily.    . vitamin B-12 (CYANOCOBALAMIN) 1000 MCG tablet Take 1,000 mcg by mouth daily.      . Multiple Vitamin  (MULTIVITAMIN) capsule Take 1 capsule by mouth daily. Take 1 tab daily    . NITROSTAT 0.4 MG SL tablet Place 0.4 mg under the tongue as needed.    . Omega-3 Fatty Acids (FISH OIL) 875  MG CAPS Take 1 capsule by mouth daily.       Results for orders placed or performed during the hospital encounter of 11/25/14 (from the past 48 hour(s))  Glucose, capillary     Status: Abnormal   Collection Time: 11/25/14 11:02 AM  Result Value Ref Range   Glucose-Capillary 177 (H) 65 - 99 mg/dL   No results found.  ROS  Blood pressure 136/81, pulse 64, temperature 97.9 F (36.6 C), temperature source Oral, resp. rate 18, height 5\' 2"  (1.575 m), weight 200 lb (90.719 kg), SpO2 99 %. Physical Exam  Constitutional: She appears well-developed and well-nourished.  HENT:  Mouth/Throat: Oropharynx is clear and moist.  Eyes: Conjunctivae are normal. No scleral icterus.  Neck: No thyromegaly present.  Cardiovascular:  Regular rhythm normal S1 and split S2. No murmur noted.  Respiratory: Effort normal and breath sounds normal.  GI:  Abdomen is full with midline scar. On palpation it is soft and nontender without organomegaly or masses.  Musculoskeletal: She exhibits no edema.  Lymphadenopathy:    She has no cervical adenopathy.  Neurological: She is alert.  Skin: Skin is warm and dry.     Assessment/Plan Nausea anorexia weight loss. Chronic diarrhea. History of sessile serrated polyp. Diagnostic EGD and colonoscopy.  Karlynn Furrow U 11/25/2014, 11:29 AM

## 2014-11-25 NOTE — Op Note (Signed)
EGD PROCEDURE REPORT  PATIENT:  Margaret Huerta  MR#:  [id number] Birthdate:  March 18, 1945, 69 y.o., female Endoscopist:  Dr. Rogene Houston, MD Referred By:  Dr. [name], MD  Procedure Date: 11/25/2014  Procedure:   EGD & Colonoscopy  Indications:  Patient is 69 year old Caucasian female with multiple medical problems including long-standing type 2 diabetes mellitus who presents with over 9 month history of nausea and anorexia 40 pound weight loss and diarrhea. She has history of colonic polyp. She had sessile serrated polyp removed 5 years ago.            Informed Consent:  The risks, benefits, alternatives & imponderables which include, but are not limited to, bleeding, infection, perforation, drug reaction and potential missed lesion have been reviewed.  The potential for biopsy, lesion removal, esophageal dilation, etc. have also been discussed.  Questions have been answered.  All parties agreeable.  Please see history & physical in medical record for more information.  Medications:  Fentanyl 50 g IV Versed 9 mg IV Benadryl 25 mg IV Cetacaine spray topically for oropharyngeal anesthesia  EGD  Description of procedure:  The endoscope was introduced through the mouth and advanced to the second portion of the duodenum without difficulty or limitations. The mucosal surfaces were surveyed very carefully during advancement of the scope and upon withdrawal.  Findings:  Esophagus:  Mucosa of the esophagus was normal. GE junction was unremarkable. GEJ:  39 cm Stomach: Very small gastric pouch. Mucosa of gastric pouch was normal. Scope could not be retroflexed. Jejunum:  Jejunal mucosa was examined for up to 30 cm and was normal. Short afferent limb blind loop with normal mucosa.  Therapeutic/Diagnostic Maneuvers Performed:   Multiple biopsies taken from jejunal mucosa distal to anastomosis.  COLONOSCOPY Description of procedure:  After a digital rectal exam was performed, that colonoscope  was advanced from the anus through the rectum and colon to the area of the cecum, ileocecal valve and appendiceal orifice. The cecum was deeply intubated. These structures were well-seen and photographed for the record. From the level of the cecum and ileocecal valve, the scope was slowly and cautiously withdrawn. The mucosal surfaces were carefully surveyed utilizing scope tip to flexion to facilitate fold flattening as needed. The scope was pulled down into the rectum where a thorough exam including retroflexion was performed. Slim scope was exchanged with pediatric colonoscope to complete the examination.  Findings:   Prep satisfactory. Redundant colon with normal mucosa throughout. Single diverticulum at splenic flexure. Random biopsies taken from mucosa of sigmoid colon. Normal rectal mucosa. Small hemorrhoids above and below the dentate line. Soft sentinel skin tags.   Therapeutic/Diagnostic Maneuvers Performed: See above.  Complications:  None  Cecal Withdrawal Time:  11 minutes  EBL: None  Impression:  EGD findings: Small gastric pouch with patent gastrojejunostomy. Short afferent limb of Roux-en-Y anastomosis. Normal jejunal mucosa for up to 30 cm. Random biopsies taken from small bowel mucosa distal to gastrojejunostomy.  Colonoscopy findings: Examination performed to cecum. 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 No evidence of recurrent polyps or endoscopic colitis. Random biopsies taken from mucosa of sigmoid colon. Small internal and external hemorrhoids.  Comment: Postprandial symptoms may be secondary to dumping or IBS. Given history of gastric bypass surgery iron B12 or folate ration she needs to be ruled out.  Recommendations:  Standard instructions given. Dicyclomine 10 mg by mouth before meals. Check serum iron TIBC ferritin B12 and folate levels today. Would be contacting  patient results of biopsy and blood tests.  REHMAN,NAJEEB U   11/25/2014 12:53 PM  CC: Dr. [name], MD & Dr. Rayne Du ref. provider found

## 2014-11-25 NOTE — Discharge Instructions (Signed)
Resume usual medications and diet. Dicyclomine 10 mg by mouth 15-30 minutes before each meal. No driving for 24 hours. Physician call with results of biopsy and blood tests.  Gastrointestinal Endoscopy, Care After Refer to this sheet in the next few weeks. These instructions provide you with information on caring for yourself after your procedure. Your caregiver may also give you more specific instructions. Your treatment has been planned according to current medical practices, but problems sometimes occur. Call your caregiver if you have any problems or questions after your procedure. HOME CARE INSTRUCTIONS  If you were given medicine to help you relax (sedative), do not drive, operate machinery, or sign important documents for 24 hours.  Avoid alcohol and hot or warm beverages for the first 24 hours after the procedure.  Only take over-the-counter or prescription medicines for pain, discomfort, or fever as directed by your caregiver. You may resume taking your normal medicines unless your caregiver tells you otherwise. Ask your caregiver when you may resume taking medicines that may cause bleeding, such as aspirin, clopidogrel, or warfarin.  You may return to your normal diet and activities on the day after your procedure, or as directed by your caregiver. Walking may help to reduce any bloated feeling in your abdomen.  Drink enough fluids to keep your urine clear or pale yellow.  You may gargle with salt water if you have a sore throat. SEEK IMMEDIATE MEDICAL CARE IF:  You have severe nausea or vomiting.  You have severe abdominal pain, abdominal cramps that last longer than 6 hours, or abdominal swelling (distention).  You have severe shoulder or back pain.  You have trouble swallowing.  You have shortness of breath, your breathing is shallow, or you are breathing faster than normal.  You have a fever or a rapid heartbeat.  You vomit blood or material that looks like coffee  grounds.  You have bloody, black, or tarry stools. MAKE SURE YOU:  Understand these instructions.  Will watch your condition.  Will get help right away if you are not doing well or get worse.   This information is not intended to replace advice given to you by your health care provider. Make sure you discuss any questions you have with your health care provider.   Document Released: 09/13/2003 Document Revised: 02/19/2014 Document Reviewed: 05/01/2011 Elsevier Interactive Patient Education [employer].

## 2014-12-01 ENCOUNTER — Encounter (HOSPITAL_COMMUNITY): Payer: Self-pay | Admitting: Internal Medicine

## 2014-12-07 ENCOUNTER — Telehealth (INDEPENDENT_AMBULATORY_CARE_PROVIDER_SITE_OTHER): Payer: Self-pay | Admitting: *Deleted

## 2014-12-07 DIAGNOSIS — D509 Iron deficiency anemia, unspecified: Secondary | ICD-10-CM

## 2014-12-07 NOTE — Telephone Encounter (Signed)
Per Dr.Rehman the patient will need to have labs drawn first week in November.

## 2014-12-09 ENCOUNTER — Encounter (INDEPENDENT_AMBULATORY_CARE_PROVIDER_SITE_OTHER): Payer: Self-pay | Admitting: *Deleted

## 2014-12-09 ENCOUNTER — Other Ambulatory Visit (INDEPENDENT_AMBULATORY_CARE_PROVIDER_SITE_OTHER): Payer: Self-pay | Admitting: *Deleted

## 2014-12-09 DIAGNOSIS — D509 Iron deficiency anemia, unspecified: Secondary | ICD-10-CM

## 2014-12-16 DIAGNOSIS — D509 Iron deficiency anemia, unspecified: Secondary | ICD-10-CM | POA: Diagnosis not present

## 2014-12-17 LAB — HEMOGLOBIN AND HEMATOCRIT, BLOOD
HCT: 34.3 % — ABNORMAL LOW (ref 36.0–46.0)
HEMOGLOBIN: 11.6 g/dL — AB (ref 12.0–15.0)

## 2014-12-24 DIAGNOSIS — Z23 Encounter for immunization: Secondary | ICD-10-CM | POA: Diagnosis not present

## 2015-01-27 DIAGNOSIS — M65332 Trigger finger, left middle finger: Secondary | ICD-10-CM | POA: Diagnosis not present

## 2015-01-27 DIAGNOSIS — R208 Other disturbances of skin sensation: Secondary | ICD-10-CM | POA: Diagnosis not present

## 2015-02-05 DIAGNOSIS — M65332 Trigger finger, left middle finger: Secondary | ICD-10-CM | POA: Diagnosis not present

## 2015-02-05 DIAGNOSIS — R208 Other disturbances of skin sensation: Secondary | ICD-10-CM | POA: Diagnosis not present

## 2015-02-15 DIAGNOSIS — M65332 Trigger finger, left middle finger: Secondary | ICD-10-CM | POA: Diagnosis not present

## 2015-02-15 DIAGNOSIS — G5622 Lesion of ulnar nerve, left upper limb: Secondary | ICD-10-CM | POA: Diagnosis not present

## 2015-02-18 DIAGNOSIS — Z79899 Other long term (current) drug therapy: Secondary | ICD-10-CM | POA: Diagnosis not present

## 2015-02-18 DIAGNOSIS — I10 Essential (primary) hypertension: Secondary | ICD-10-CM | POA: Diagnosis not present

## 2015-02-18 DIAGNOSIS — M109 Gout, unspecified: Secondary | ICD-10-CM | POA: Diagnosis not present

## 2015-02-18 DIAGNOSIS — E119 Type 2 diabetes mellitus without complications: Secondary | ICD-10-CM | POA: Diagnosis not present

## 2015-02-25 DIAGNOSIS — Z6835 Body mass index (BMI) 35.0-35.9, adult: Secondary | ICD-10-CM | POA: Diagnosis not present

## 2015-02-25 DIAGNOSIS — M1000 Idiopathic gout, unspecified site: Secondary | ICD-10-CM | POA: Diagnosis not present

## 2015-02-25 DIAGNOSIS — N181 Chronic kidney disease, stage 1: Secondary | ICD-10-CM | POA: Diagnosis not present

## 2015-02-25 DIAGNOSIS — E1129 Type 2 diabetes mellitus with other diabetic kidney complication: Secondary | ICD-10-CM | POA: Diagnosis not present

## 2015-03-15 ENCOUNTER — Ambulatory Visit (INDEPENDENT_AMBULATORY_CARE_PROVIDER_SITE_OTHER): Payer: Medicare Other | Admitting: Cardiovascular Disease

## 2015-03-15 ENCOUNTER — Encounter: Payer: Self-pay | Admitting: Cardiovascular Disease

## 2015-03-15 DIAGNOSIS — I10 Essential (primary) hypertension: Secondary | ICD-10-CM | POA: Diagnosis not present

## 2015-03-15 DIAGNOSIS — E785 Hyperlipidemia, unspecified: Secondary | ICD-10-CM

## 2015-03-15 NOTE — Progress Notes (Signed)
03/15/2015 Margaret Huerta   1945-03-04  [id number]  Primary Physician [name], MD Primary Cardiologist: Lorretta Harp MD [name]   HPI: 70 y/o female with history of hypertension, hyperlipidemia, type 2 diabetes, and obstructive sleep apnea (noncompliant with CPAP with a strong family history for heart disease, remote renal cell carcinoma s/p right nephrectomy, chronic renal insufficieny (baseline Scr 1.7), admitted for pre-cath hydration with plans for LHC due to recent high risk NST and recent h/o syncope and chest pain. She had cardiac catheterization performed by Dr. Einar Gip 2003 revealing normal coronary arteries. She recently had a high risk nuclear stress test and was admitted for hydration prior to her cath which I performed on 05/27/14 was revealed normal coronary arteries and normal LV function. She saw Dr. Crissie Sickles for evaluation syncope which she thought was related to orthostasis and adjusted her anti-hypertensive medications. Since I saw her back in 07/07/14 she's remained clinically stable. Blood pressure has been well-controlled. She has occasional atypical chest pain. She is scheduled to have left hand surgery tomorrow which I am clearing her for at low cardiovascular risk.   Current Outpatient Prescriptions  Medication Sig Dispense Refill  . acetaminophen (TYLENOL) 325 MG tablet Take 2 tablets (650 mg total) by mouth every 4 (four) hours as needed for headache or mild pain.    Marland Kitchen aspirin 81 MG tablet Take 81 mg by mouth daily.    Marland Kitchen atenolol (TENORMIN) 50 MG tablet Take 1 tablet (50 mg total) by mouth daily. 30 tablet 11  . cholecalciferol (VITAMIN D) 1000 UNITS tablet Take 5,000 Units by mouth daily.     Marland Kitchen dicyclomine (BENTYL) 10 MG capsule Take 1 capsule (10 mg total) by mouth 3 (three) times daily before meals. 90 capsule 2  . diphenhydrAMINE (BENADRYL) 25 mg capsule Take 25 mg by mouth 2 (two) times daily as needed.    . docusate sodium (COLACE) 100 MG  capsule Take 100 mg by mouth daily.    . DULoxetine (CYMBALTA) 60 MG capsule Take 60 mg by mouth daily.      . furosemide (LASIX) 40 MG tablet Take 0.5 tablets (20 mg total) by mouth daily. Take 40 mg in the AM and 20 mg in the PM (Patient taking differently: Take 40 mg by mouth 2 (two) times daily. Take 40 mg in the AM and 20 mg in the PM) 30 tablet   . insulin glargine (LANTUS) 100 UNIT/ML injection Inject 55 Units into the skin at bedtime.     . metFORMIN (GLUCOPHAGE) 500 MG tablet Take 500 mg by mouth daily with breakfast.     . Multiple Vitamin (MULTIVITAMIN) capsule Take 1 capsule by mouth daily. Take 1 tab daily    . NITROSTAT 0.4 MG SL tablet Place 0.4 mg under the tongue as needed.    . Omega-3 Fatty Acids (FISH OIL) 875 MG CAPS Take 1 capsule by mouth daily.     . rosuvastatin (CRESTOR) 20 MG tablet Take 20 mg by mouth daily.      . vitamin B-12 (CYANOCOBALAMIN) 1000 MCG tablet Take 1,000 mcg by mouth daily.       No current facility-administered medications for this visit.    Allergies  Allergen Reactions  . Demerol [Meperidine] Shortness Of Breath    Stop breathing  . Hydrocodone-Acetaminophen Shortness Of Breath    REACTION: stop breathing  . Meperidine Hcl Shortness Of Breath    REACTION: stop breathing  . Vicodin [Hydrocodone-Acetaminophen] Shortness Of  Breath    Stop breathing  . Ace Inhibitors Cough  . Codeine     REACTION: hallucination, confused  . Fentanyl Itching  . Hydromorphone Hcl     REACTION: itching, rash, nausea, vomiting  . Morphine     REACTION: vomiting  . Naproxen     REACTION: stomach lesion  . Dilaudid [Hydromorphone Hcl] Itching, Nausea And Vomiting and Rash    Social History   Social History  . Marital Status: Married    Spouse Name: N/A  . Number of Children: N/A  . Years of Education: N/A   Occupational History  . Housewife    Social History Main Topics  . Smoking status: Never Smoker   . Smokeless tobacco: Never Used  . Alcohol  Use: No  . Drug Use: No  . Sexual Activity: Yes    Birth Control/ Protection: Surgical   Other Topics Concern  . Not on file   Social History Narrative     Review of Systems: General: negative for chills, fever, night sweats or weight changes.  Cardiovascular: negative for chest pain, dyspnea on exertion, edema, orthopnea, palpitations, paroxysmal nocturnal dyspnea or shortness of breath Dermatological: negative for rash Respiratory: negative for cough or wheezing Urologic: negative for hematuria Abdominal: negative for nausea, vomiting, diarrhea, bright red blood per rectum, melena, or hematemesis Neurologic: negative for visual changes, syncope, or dizziness All other systems reviewed and are otherwise negative except as noted above.    Blood pressure 120/70, pulse 65, height 5\' 2"  (1.575 m), weight 201 lb (91.173 kg).  General appearance: alert and no distress Neck: no adenopathy, no carotid bruit, no JVD, supple, symmetrical, trachea midline and thyroid not enlarged, symmetric, no tenderness/mass/nodules Lungs: clear to auscultation bilaterally Heart: regular rate and rhythm, S1, S2 normal, no murmur, click, rub or gallop Extremities: extremities normal, atraumatic, no cyanosis or edema  EKG normal sinus rhythm at 65 without ST or T-wave changes. I personally reviewed this EKG  ASSESSMENT AND PLAN:   Dyslipidemia History of dyslipidemia on Crestor followed by her PCP  Essential hypertension History of hypertension blood pressure measured today 120/70. She is on atenolol. Continued current meds at current dosing  Normal coronary arteries 2003 and 05/26/14 History of normal coronary arteries by cath remotely and again most recently by myself 05/27/14. She is scheduled for left hand surgery tomorrow which I'm clearing her for at low risk.      Lorretta Harp MD FACP,FACC,FAHA, [facility] 03/15/2015 11:30 AM

## 2015-03-15 NOTE — Assessment & Plan Note (Signed)
History of hypertension blood pressure measured today 120/70. She is on atenolol. Continued current meds at current dosing

## 2015-03-15 NOTE — Assessment & Plan Note (Signed)
History of normal coronary arteries by cath remotely and again most recently by myself 05/27/14. She is scheduled for left hand surgery tomorrow which I'm clearing her for at low risk.

## 2015-03-15 NOTE — Assessment & Plan Note (Signed)
History of dyslipidemia on Crestor followed by her PCP

## 2015-03-15 NOTE — Patient Instructions (Signed)

## 2015-03-16 DIAGNOSIS — G5622 Lesion of ulnar nerve, left upper limb: Secondary | ICD-10-CM | POA: Diagnosis not present

## 2015-03-16 DIAGNOSIS — M65332 Trigger finger, left middle finger: Secondary | ICD-10-CM | POA: Diagnosis not present

## 2015-03-28 DIAGNOSIS — Z4789 Encounter for other orthopedic aftercare: Secondary | ICD-10-CM | POA: Diagnosis not present

## 2015-03-28 DIAGNOSIS — G5622 Lesion of ulnar nerve, left upper limb: Secondary | ICD-10-CM | POA: Diagnosis not present

## 2015-04-01 ENCOUNTER — Encounter (INDEPENDENT_AMBULATORY_CARE_PROVIDER_SITE_OTHER): Payer: Self-pay

## 2015-04-12 DIAGNOSIS — G5622 Lesion of ulnar nerve, left upper limb: Secondary | ICD-10-CM | POA: Diagnosis not present

## 2015-04-25 DIAGNOSIS — Z4789 Encounter for other orthopedic aftercare: Secondary | ICD-10-CM | POA: Diagnosis not present

## 2015-05-09 ENCOUNTER — Ambulatory Visit (INDEPENDENT_AMBULATORY_CARE_PROVIDER_SITE_OTHER): Payer: Self-pay | Admitting: Internal Medicine

## 2015-05-16 DIAGNOSIS — E119 Type 2 diabetes mellitus without complications: Secondary | ICD-10-CM | POA: Diagnosis not present

## 2015-05-16 DIAGNOSIS — N184 Chronic kidney disease, stage 4 (severe): Secondary | ICD-10-CM | POA: Diagnosis not present

## 2015-05-16 DIAGNOSIS — Z79899 Other long term (current) drug therapy: Secondary | ICD-10-CM | POA: Diagnosis not present

## 2015-05-16 DIAGNOSIS — M109 Gout, unspecified: Secondary | ICD-10-CM | POA: Diagnosis not present

## 2015-05-30 DIAGNOSIS — N184 Chronic kidney disease, stage 4 (severe): Secondary | ICD-10-CM | POA: Diagnosis not present

## 2015-05-30 DIAGNOSIS — E1129 Type 2 diabetes mellitus with other diabetic kidney complication: Secondary | ICD-10-CM | POA: Diagnosis not present

## 2015-05-30 DIAGNOSIS — I10 Essential (primary) hypertension: Secondary | ICD-10-CM | POA: Diagnosis not present

## 2015-05-31 DIAGNOSIS — E113293 Type 2 diabetes mellitus with mild nonproliferative diabetic retinopathy without macular edema, bilateral: Secondary | ICD-10-CM | POA: Diagnosis not present

## 2015-05-31 DIAGNOSIS — H26493 Other secondary cataract, bilateral: Secondary | ICD-10-CM | POA: Diagnosis not present

## 2015-05-31 DIAGNOSIS — H524 Presbyopia: Secondary | ICD-10-CM | POA: Diagnosis not present

## 2015-05-31 DIAGNOSIS — H52222 Regular astigmatism, left eye: Secondary | ICD-10-CM | POA: Diagnosis not present

## 2015-05-31 DIAGNOSIS — H5211 Myopia, right eye: Secondary | ICD-10-CM | POA: Diagnosis not present

## 2015-05-31 DIAGNOSIS — H35033 Hypertensive retinopathy, bilateral: Secondary | ICD-10-CM | POA: Diagnosis not present

## 2015-06-01 DIAGNOSIS — H3561 Retinal hemorrhage, right eye: Secondary | ICD-10-CM | POA: Diagnosis not present

## 2015-06-01 DIAGNOSIS — H35042 Retinal micro-aneurysms, unspecified, left eye: Secondary | ICD-10-CM | POA: Diagnosis not present

## 2015-06-01 DIAGNOSIS — H35073 Retinal telangiectasis, bilateral: Secondary | ICD-10-CM | POA: Diagnosis not present

## 2015-06-01 DIAGNOSIS — E113291 Type 2 diabetes mellitus with mild nonproliferative diabetic retinopathy without macular edema, right eye: Secondary | ICD-10-CM | POA: Diagnosis not present

## 2015-06-02 ENCOUNTER — Encounter (INDEPENDENT_AMBULATORY_CARE_PROVIDER_SITE_OTHER): Payer: Self-pay | Admitting: Internal Medicine

## 2015-06-02 ENCOUNTER — Encounter (INDEPENDENT_AMBULATORY_CARE_PROVIDER_SITE_OTHER): Payer: Self-pay

## 2015-06-02 ENCOUNTER — Ambulatory Visit (INDEPENDENT_AMBULATORY_CARE_PROVIDER_SITE_OTHER): Payer: Medicare Other | Admitting: Internal Medicine

## 2015-06-02 VITALS — BP 130/67 | HR 72 | Temp 98.0°F | Ht 62.0 in | Wt 191.2 lb

## 2015-06-02 DIAGNOSIS — R197 Diarrhea, unspecified: Secondary | ICD-10-CM

## 2015-06-02 DIAGNOSIS — R634 Abnormal weight loss: Secondary | ICD-10-CM | POA: Diagnosis not present

## 2015-06-02 LAB — CBC WITH DIFFERENTIAL/PLATELET
BASOS PCT: 1 %
Basophils Absolute: 62 cells/uL (ref 0–200)
Eosinophils Absolute: 186 cells/uL (ref 15–500)
Eosinophils Relative: 3 %
HEMATOCRIT: 34 % — AB (ref 35.0–45.0)
Hemoglobin: 10.8 g/dL — ABNORMAL LOW (ref 11.7–15.5)
LYMPHS PCT: 21 %
Lymphs Abs: 1302 cells/uL (ref 850–3900)
MCH: 26.7 pg — ABNORMAL LOW (ref 27.0–33.0)
MCHC: 31.8 g/dL — AB (ref 32.0–36.0)
MCV: 84.2 fL (ref 80.0–100.0)
MONO ABS: 434 {cells}/uL (ref 200–950)
MONOS PCT: 7 %
MPV: 10.6 fL (ref 7.5–12.5)
NEUTROS ABS: 4216 {cells}/uL (ref 1500–7800)
NEUTROS PCT: 68 %
PLATELETS: 226 10*3/uL (ref 140–400)
RBC: 4.04 MIL/uL (ref 3.80–5.10)
RDW: 16.9 % — AB (ref 11.0–15.0)
WBC: 6.2 10*3/uL (ref 3.8–10.8)

## 2015-06-02 MED ORDER — DICYCLOMINE HCL 10 MG PO CAPS: 10.0000 mg | ORAL_CAPSULE | Freq: Three times a day (TID) | ORAL | Status: DC

## 2015-06-02 NOTE — Patient Instructions (Addendum)
CBC.  Stop the Colace. Dicyclomine 10mg  TID. OV in 6 months.

## 2015-06-02 NOTE — Progress Notes (Addendum)
Subjective:    Patient ID: Margaret Huerta, female    DOB: 1945-11-06, 70 y.o.   MRN: [id number]  HPI Here today for f/u. Recently underwent an EGD/Colonosocpy for nausea/anorexia. See below. She tells me she does not have an appetite. She is eating 2 meals a day. She has nausea after she eats and she has some diarrhea. She is having about 5-6 loose stools (soft) a day. If she doesn't take a stool softener she will become constipated. She has nausea but no vomiting.   Diabetic since 1990.  10/26/2014 weight 203. 06/02/2015 Weight 191 11/25/2014 Ferritin 121, Iron 69,  Vitamin B12 1134, folate 28  1013/2016: EGD & Colonoscopy  Indications: Patient is 70 year old Caucasian female with multiple medical problems including long-standing type 2 diabetes mellitus who presents with over 9 month history of nausea and anorexia 40 pound weight loss and diarrhea. She has history of colonic polyp. She had sessile serrated polyp removed 5 years ago.  Impression:  EGD findings: Small gastric pouch with patent gastrojejunostomy. Short afferent limb of Roux-en-Y anastomosis. Normal jejunal mucosa for up to 30 cm. Random biopsies taken from small bowel mucosa distal to gastrojejunostomy.  Colonoscopy findings: Examination performed to cecum. 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 No evidence of recurrent polyps or endoscopic colitis. Random biopsies taken from mucosa of sigmoid colon. Small internal and external hemorrhoids.  Lab results reviewed with patient. Anemia does not appear to be secondary to iron deficiency or B12 folate deficiency. Will check H&H in first week of November 2016. Report to PCP.  CBC    Component Value Date/Time   WBC 4.9 05/27/2014 0503   RBC 4.09 05/27/2014 0503   HGB 11.6* 12/16/2014 1049   HCT 34.3* 12/16/2014 1049   PLT  201 05/27/2014 0503   MCV 83.1 05/27/2014 0503   MCH 26.2 05/27/2014 0503   MCHC 31.5 05/27/2014 0503   RDW 16.1* 05/27/2014 0503   LYMPHSABS 2.2 05/24/2014 1231   MONOABS 0.5 05/24/2014 1231   EOSABS 0.2 05/24/2014 1231   BASOSABS 0.0 05/24/2014 1231       Review of Systems Past Medical History  Diagnosis Date  . Hyperlipidemia   . Hypertension   . Neuropathy ([place])   . Osteoporosis   . Fibromyalgia   . Depression   . Hx of cardiovascular stress test April 2016    false positive Myoview  . Pneumonia "several times"  . Chronic bronchitis ([place])     "get it q yr"  . Sleep apnea     "wore mask for 2 months; could not take it" (05/26/2014)  . Parathyroid disease ([place])     "my PHT levels run high; I can't take calcium"  . Type II diabetes mellitus ([place])   . Anemia   . History of blood transfusion     "S/P tonsillectomy"  . History of hiatal hernia   . GERD (gastroesophageal reflux disease)   . Migraine     "monthly" (05/26/2014)  . Febrile seizure ([place])     "as a child"  . Typhus fever     "as a child"  . Chronic lower back pain   . Gout   . Anxiety   . Renal insufficiency     "left kidney works at 40-60%" (05/26/2014)  . Cervical cancer ([place])   . Renal cancer [facility])     "right"-s/p nephrectomy    Past Surgical History  Procedure Laterality Date  . Nephrectomy Right 2002    "  cancer"  . Back surgery    . Appendectomy    . Tmj arthroplasty  1988  . Dilation and curettage of uterus    . Tonsillectomy  1976  . Incontinence surgery  1983  . Cystoscopy w/ [place] manipulation  1988  . Mouth surgery  1991    "drilled into gum and put tooth implants uppers"  . Upper gi endoscopy    . Knee arthroscopy Left 2000's  . Gastric bypass  2012  . Cardiac catheterization  2003, April 2016    normal coronaries  . Eye surgery    . Laparoscopic cholecystectomy  1990's  . Hernia repair      2015  . Abdominal hernia repair  02/2013    "in [place]"  . Lumbar disc surgery   2001    "herniated discs"  . Abdominal hysterectomy  1977    "partial"  . Bilateral salpingoophorectomy Bilateral ~ 1993  . Hiatal hernia repair  1996  . Diagnostic laparoscopy  1975  . Esophagogastroduodenoscopy (egd) with esophageal dilation  1997  . Cataract extraction w/ intraocular lens  implant, bilateral  2015  . Left heart catheterization with coronary angiogram N/A 05/27/2014    Procedure: LEFT HEART CATHETERIZATION WITH CORONARY ANGIOGRAM;  Surgeon: Lorretta Harp, MD;  Location: [facility] CATH LAB;  Service: Cardiovascular;  Laterality: N/A;  . Esophagogastroduodenoscopy N/A 11/25/2014    Procedure: ESOPHAGOGASTRODUODENOSCOPY (EGD);  Surgeon: Rogene Houston, MD;  Location: [facility] ENDO SUITE;  Service: Endoscopy;  Laterality: N/A;  9:30 - moved to 11:25 - [name] to notify pt  . Colonoscopy N/A 11/25/2014    Procedure: COLONOSCOPY;  Surgeon: Rogene Houston, MD;  Location: [facility] ENDO SUITE;  Service: Endoscopy;  Laterality: N/A;    Allergies  Allergen Reactions  . Demerol [Meperidine] Shortness Of Breath    Stop breathing  . Hydrocodone-Acetaminophen Shortness Of Breath    REACTION: stop breathing  . Meperidine Hcl Shortness Of Breath    REACTION: stop breathing  . Vicodin [Hydrocodone-Acetaminophen] Shortness Of Breath    Stop breathing  . Ace Inhibitors Cough  . Codeine     REACTION: hallucination, confused  . Fentanyl Itching  . Hydromorphone Hcl     REACTION: itching, rash, nausea, vomiting  . Morphine     REACTION: vomiting  . Naproxen     REACTION: stomach lesion  . Dilaudid [Hydromorphone Hcl] Itching, Nausea And Vomiting and Rash    Current Outpatient Prescriptions on File Prior to Visit  Medication Sig Dispense Refill  . acetaminophen (TYLENOL) 325 MG tablet Take 2 tablets (650 mg total) by mouth every 4 (four) hours as needed for headache or mild pain.    Marland Kitchen aspirin 81 MG tablet Take 81 mg by mouth daily.    Marland Kitchen atenolol (TENORMIN) 50 MG tablet Take 1 tablet (50 mg total)  by mouth daily. 30 tablet 11  . cholecalciferol (VITAMIN D) 1000 UNITS tablet Take 5,000 Units by mouth daily.     . diphenhydrAMINE (BENADRYL) 25 mg capsule Take 25 mg by mouth 2 (two) times daily as needed.    . docusate sodium (COLACE) 100 MG capsule Take 100 mg by mouth daily.    . DULoxetine (CYMBALTA) 60 MG capsule Take 60 mg by mouth daily.      . furosemide (LASIX) 40 MG tablet Take 0.5 tablets (20 mg total) by mouth daily. Take 40 mg in the AM and 20 mg in the PM (Patient taking differently: Take 40 mg by mouth 2 (two)  times daily. Take 40 mg in the AM and 20 mg in the PM) 30 tablet   . insulin glargine (LANTUS) 100 UNIT/ML injection Inject 50 Units into the skin at bedtime.     . metFORMIN (GLUCOPHAGE) 500 MG tablet Take 500 mg by mouth daily with breakfast. Reported on 06/02/2015    . Multiple Vitamin (MULTIVITAMIN) capsule Take 1 capsule by mouth daily. Take 1 tab daily    . NITROSTAT 0.4 MG SL tablet Place 0.4 mg under the tongue as needed.    . Omega-3 Fatty Acids (FISH OIL) 875 MG CAPS Take 1 capsule by mouth daily.     . rosuvastatin (CRESTOR) 20 MG tablet Take 20 mg by mouth daily.      . vitamin B-12 (CYANOCOBALAMIN) 1000 MCG tablet Take 1,000 mcg by mouth daily.       No current facility-administered medications on file prior to visit.        Objective:   Physical ExamBlood pressure 130/67, pulse 72, temperature 98 F (36.7 C), height 5\' 2"  (1.575 m), weight 191 lb 3.2 oz (86.728 kg). Alert and oriented. Skin warm and dry. Oral mucosa is moist.   . Sclera anicteric, conjunctivae is pink. Thyroid not enlarged. No cervical lymphadenopathy. Lungs clear. Heart regular rate and rhythm.  Abdomen is soft. Bowel sounds are positive. No hepatomegaly. No abdominal masses felt. No tenderness.  No edema to lower extremities.           Assessment & Plan:  Weight loss. She had a normal EGD and colonoscopy. Hx of gastric bypass in 2012. Will get a CBC on her today.  Ov in 6  months.

## 2015-06-13 ENCOUNTER — Telehealth (INDEPENDENT_AMBULATORY_CARE_PROVIDER_SITE_OTHER): Payer: Self-pay | Admitting: Internal Medicine

## 2015-06-13 NOTE — Telephone Encounter (Signed)
Results given to patient.  Margaret Huerta, CBC in 3 weeks

## 2015-06-15 ENCOUNTER — Telehealth (INDEPENDENT_AMBULATORY_CARE_PROVIDER_SITE_OTHER): Payer: Self-pay | Admitting: *Deleted

## 2015-06-15 ENCOUNTER — Encounter (INDEPENDENT_AMBULATORY_CARE_PROVIDER_SITE_OTHER): Payer: Self-pay | Admitting: *Deleted

## 2015-06-15 DIAGNOSIS — R634 Abnormal weight loss: Secondary | ICD-10-CM

## 2015-06-15 NOTE — Telephone Encounter (Signed)
.  Per Lelon Perla patient to have CBC in 3 weeks.

## 2015-06-15 NOTE — Telephone Encounter (Signed)
CBC has been noted.  A letter has been sent to the patient also as a reminder.

## 2015-06-20 DIAGNOSIS — E1121 Type 2 diabetes mellitus with diabetic nephropathy: Secondary | ICD-10-CM | POA: Diagnosis not present

## 2015-06-20 DIAGNOSIS — Z905 Acquired absence of kidney: Secondary | ICD-10-CM | POA: Diagnosis not present

## 2015-06-20 DIAGNOSIS — Z79899 Other long term (current) drug therapy: Secondary | ICD-10-CM | POA: Diagnosis not present

## 2015-06-20 DIAGNOSIS — E559 Vitamin D deficiency, unspecified: Secondary | ICD-10-CM | POA: Diagnosis not present

## 2015-06-20 DIAGNOSIS — I129 Hypertensive chronic kidney disease with stage 1 through stage 4 chronic kidney disease, or unspecified chronic kidney disease: Secondary | ICD-10-CM | POA: Diagnosis not present

## 2015-06-20 DIAGNOSIS — N189 Chronic kidney disease, unspecified: Secondary | ICD-10-CM | POA: Diagnosis not present

## 2015-06-20 DIAGNOSIS — Z9884 Bariatric surgery status: Secondary | ICD-10-CM | POA: Diagnosis not present

## 2015-06-20 DIAGNOSIS — Z85528 Personal history of other malignant neoplasm of kidney: Secondary | ICD-10-CM | POA: Diagnosis not present

## 2015-07-06 DIAGNOSIS — R634 Abnormal weight loss: Secondary | ICD-10-CM | POA: Diagnosis not present

## 2015-07-06 LAB — CBC
HEMATOCRIT: 35.8 % (ref 35.0–45.0)
HEMOGLOBIN: 11.1 g/dL — AB (ref 11.7–15.5)
MCH: 26.1 pg — AB (ref 27.0–33.0)
MCHC: 31 g/dL — ABNORMAL LOW (ref 32.0–36.0)
MCV: 84.2 fL (ref 80.0–100.0)
MPV: 11 fL (ref 7.5–12.5)
Platelets: 238 10*3/uL (ref 140–400)
RBC: 4.25 MIL/uL (ref 3.80–5.10)
RDW: 17.3 % — ABNORMAL HIGH (ref 11.0–15.0)
WBC: 5.3 10*3/uL (ref 3.8–10.8)

## 2015-07-13 ENCOUNTER — Encounter (INDEPENDENT_AMBULATORY_CARE_PROVIDER_SITE_OTHER): Payer: Self-pay | Admitting: *Deleted

## 2015-07-13 ENCOUNTER — Telehealth (INDEPENDENT_AMBULATORY_CARE_PROVIDER_SITE_OTHER): Payer: Self-pay | Admitting: *Deleted

## 2015-07-13 DIAGNOSIS — R634 Abnormal weight loss: Secondary | ICD-10-CM

## 2015-07-13 NOTE — Telephone Encounter (Signed)
.  Per Terri Setzer,NP patient to have lab work in 4 weeks. 

## 2015-07-14 ENCOUNTER — Telehealth (INDEPENDENT_AMBULATORY_CARE_PROVIDER_SITE_OTHER): Payer: Self-pay | Admitting: Internal Medicine

## 2015-07-14 NOTE — Telephone Encounter (Signed)
Patient called, stated she was returning Terri's call.  [phone]

## 2015-07-25 NOTE — Telephone Encounter (Signed)
Her diarrhea has resolved after stopping the Metformin. She feels better. WIll get repeat CBC in 4 weeks

## 2015-07-25 NOTE — Telephone Encounter (Signed)
I have talked with patient  

## 2015-07-28 ENCOUNTER — Other Ambulatory Visit: Payer: Self-pay | Admitting: Cardiovascular Disease

## 2015-07-28 NOTE — Telephone Encounter (Signed)
Rx(s) sent to pharmacy electronically.  

## 2015-08-10 DIAGNOSIS — R634 Abnormal weight loss: Secondary | ICD-10-CM | POA: Diagnosis not present

## 2015-08-10 LAB — HEMOGLOBIN AND HEMATOCRIT, BLOOD
HCT: 36.7 % (ref 35.0–45.0)
Hemoglobin: 11.9 g/dL (ref 11.7–15.5)

## 2015-08-17 ENCOUNTER — Telehealth: Payer: Self-pay | Admitting: Cardiovascular Disease

## 2015-08-17 ENCOUNTER — Telehealth (INDEPENDENT_AMBULATORY_CARE_PROVIDER_SITE_OTHER): Payer: Self-pay | Admitting: *Deleted

## 2015-08-17 DIAGNOSIS — R634 Abnormal weight loss: Secondary | ICD-10-CM

## 2015-08-17 DIAGNOSIS — D649 Anemia, unspecified: Secondary | ICD-10-CM

## 2015-08-17 NOTE — Telephone Encounter (Signed)
.  Per Margaret Huerta patient is to have lab work n 1 months.

## 2015-08-17 NOTE — Telephone Encounter (Signed)
Agree with those recs. If there is an increase in nitro use over time, she may need to follow-up with her cardiologist for this.  Dr. Lemmie Evens

## 2015-08-17 NOTE — Telephone Encounter (Signed)
Patient had chest pain at 3am Monday night. She took 2 NTG tabs and felt better.  Notes she has not had to take NTG recently. She did not have any shortness of breath, radiating pain, or other symptoms at the time. She has been asymptomatic since. She does note occasional twinges of chest pain periodically, may have been better controlled on isosorbide.  Patient used to take isosorbide mono 60mg  24hr daily, was taken off this med about 1 year ago due to her BP dropping/syncope. Not clear why this was discontinued instead of reduced. Reports she also was taken off atenolol and verapamil around the same time. She notes no recent issues related to BP fluctuations.  Pt aware of recommendation to go to ED if chest pain returns and NTG fails to resolve her symptoms. Will route to [facility] for recommendations - pt aware to consider flex app visit or restart of isosorbide.

## 2015-08-17 NOTE — Telephone Encounter (Signed)
Pt c/o of Chest Pain: 1. Are you having CP right now?no  2. Are you experiencing any other symptoms (ex. SOB, nausea, vomiting, sweating)? NO . How long have you been experiencing CP? Just had some on Monday  4. Is your CP continuous or coming and going?comiong and going 5. Have you taken Nitroglycerin?yes

## 2015-08-18 NOTE — Telephone Encounter (Signed)
Patient agreeable to recommendations - she is aware to call if she needs to address new concerns and/or if the freq of symptoms increases. Pt expressed that she is comfortable following this at home for now and will call if she notes using the nitro more frequently.  She is aware we can arrange for a mid-level provider visit if she feels she needs to be seen in office.

## 2015-08-18 NOTE — Telephone Encounter (Signed)
Left msg for patient to call back

## 2015-08-23 DIAGNOSIS — E119 Type 2 diabetes mellitus without complications: Secondary | ICD-10-CM | POA: Diagnosis not present

## 2015-08-23 DIAGNOSIS — Z79899 Other long term (current) drug therapy: Secondary | ICD-10-CM | POA: Diagnosis not present

## 2015-08-25 DIAGNOSIS — N189 Chronic kidney disease, unspecified: Secondary | ICD-10-CM | POA: Diagnosis not present

## 2015-08-25 DIAGNOSIS — M793 Panniculitis, unspecified: Secondary | ICD-10-CM | POA: Diagnosis not present

## 2015-08-25 DIAGNOSIS — E1122 Type 2 diabetes mellitus with diabetic chronic kidney disease: Secondary | ICD-10-CM | POA: Diagnosis not present

## 2015-08-25 DIAGNOSIS — Z794 Long term (current) use of insulin: Secondary | ICD-10-CM | POA: Diagnosis not present

## 2015-08-30 DIAGNOSIS — I10 Essential (primary) hypertension: Secondary | ICD-10-CM | POA: Diagnosis not present

## 2015-08-30 DIAGNOSIS — N184 Chronic kidney disease, stage 4 (severe): Secondary | ICD-10-CM | POA: Diagnosis not present

## 2015-08-30 DIAGNOSIS — E1129 Type 2 diabetes mellitus with other diabetic kidney complication: Secondary | ICD-10-CM | POA: Diagnosis not present

## 2015-09-05 ENCOUNTER — Telehealth: Payer: Self-pay | Admitting: Cardiovascular Disease

## 2015-09-05 NOTE — Telephone Encounter (Signed)
Spoke with patient. She called in early July w/similar complaints of chest pain. Patient was taken off imdur last year (passed out). Patient's chest pain (starts feeling like indigestion so she took 8 tums w/no relief) started back yesterday - took 2 NTG w/relief. Patient has a headache this AM, tired, no energy. Patient has a constant ache in her chest. Patient gets weak with chest pain - no shortness of breath, nausea, vomiting   Patient was offered appt 7/25 @ 0730 w/Dr. Gwenlyn Found or could send MD message for advice. She opted for in office eval, which I feel is appropriate given the recurrence of symptoms.   Scheduled for 7/25 @ 0730

## 2015-09-05 NOTE — Telephone Encounter (Signed)
Pt started having chest pains again yestesterday. She took nitro,had to take 2 before it stopped.This morning,she just have a bad headache. She was told a few weeks ago if the chest pains started again,call so medicine could be adjusted.

## 2015-09-06 ENCOUNTER — Ambulatory Visit (INDEPENDENT_AMBULATORY_CARE_PROVIDER_SITE_OTHER): Payer: Medicare Other | Admitting: Cardiovascular Disease

## 2015-09-06 VITALS — BP 142/68 | HR 63 | Ht 63.0 in | Wt 195.6 lb

## 2015-09-06 DIAGNOSIS — IMO0001 Reserved for inherently not codable concepts without codable children: Secondary | ICD-10-CM

## 2015-09-06 DIAGNOSIS — Z0389 Encounter for observation for other suspected diseases and conditions ruled out: Secondary | ICD-10-CM

## 2015-09-06 DIAGNOSIS — E785 Hyperlipidemia, unspecified: Secondary | ICD-10-CM | POA: Diagnosis not present

## 2015-09-06 DIAGNOSIS — I10 Essential (primary) hypertension: Secondary | ICD-10-CM | POA: Diagnosis not present

## 2015-09-06 NOTE — Addendum Note (Signed)
Addended by: Zebedee Iba on: 09/06/2015 08:23 AM   Modules accepted: Orders

## 2015-09-06 NOTE — Assessment & Plan Note (Signed)
History of hypertension blood pressure measured at 142/68. She is on atenolol. Continue current meds. Doesn't

## 2015-09-06 NOTE — Patient Instructions (Signed)
Medication Instructions:  Your physician recommends that you continue on your current medications as directed. Please refer to the Current Medication list given to you today.   Follow-Up: Your physician recommends that you schedule a follow-up appointment ON AN AS NEEDED BASIS.  If you need a refill on your cardiac medications before your next appointment, please call your pharmacy.   

## 2015-09-06 NOTE — Assessment & Plan Note (Signed)
History of syncope in the past thought to be orthostatic in nature. Her blood pressure medicines have been adjusted. She's had no further syncopal episodes since I saw her 6 months ago

## 2015-09-06 NOTE — Assessment & Plan Note (Signed)
History of normal coronary arteries by cardiac catheterization in 2003 and again 05/27/14. I've communicated to the patient that I believe her chest pain is noncardiac.

## 2015-09-06 NOTE — Progress Notes (Signed)
09/06/2015 Margaret Huerta   02-06-1946  [id number]  Primary Physician [name], MD Primary Cardiologist: Lorretta Harp MD [name]  HPI:  70y/o female with history of hypertension, hyperlipidemia, type 2 diabetes, and obstructive sleep apnea (noncompliant with CPAP) with a strong family history for heart disease, remote renal cell carcinoma s/p right nephrectomy, chronic renal insufficieny (baseline Scr 1.7), admitted for pre-cath hydration with plans for LHC due to recent high risk NST and recent h/o syncope and chest pain. I last saw her in the office 03/15/15. She had cardiac catheterization performed by Dr. Einar Gip 2003 revealing normal coronary arteries. She recently had a high risk nuclear stress test and was admitted for hydration prior to her cath which I performed on 05/27/14 was revealed normal coronary arteries and normal LV function. She saw Dr. Crissie Sickles for evaluation syncope which she thought was related to orthostasis and adjusted her anti-hypertensive medications. Since I saw her back in 07/07/14 she's remained clinically stable. Blood pressure has been well-controlled. She has occasional atypical chest pain. Since I saw her 6 months ago she's had some mild atypical chest pain. She is also being worked up for mild anemia. She's had no further syncopal episodes.   Current Outpatient Prescriptions  Medication Sig Dispense Refill  . acetaminophen (TYLENOL) 325 MG tablet Take 2 tablets (650 mg total) by mouth every 4 (four) hours as needed for headache or mild pain.    Marland Kitchen aspirin 81 MG tablet Take 81 mg by mouth daily.    Marland Kitchen atenolol (TENORMIN) 50 MG tablet TAKE ONE TABLET BY MOUTH ONCE DAILY 30 tablet 7  . diphenhydrAMINE (BENADRYL) 25 mg capsule Take 25 mg by mouth 2 (two) times daily as needed.    . DULoxetine (CYMBALTA) 60 MG capsule Take 60 mg by mouth daily.      . furosemide (LASIX) 40 MG tablet Take 0.5 tablets (20 mg total) by mouth daily. Take 40 mg in the  AM and 20 mg in the PM (Patient taking differently: Take 40 mg by mouth 2 (two) times daily. Take 40 mg in the AM and 20 mg in the PM) 30 tablet   . insulin glargine (LANTUS) 100 UNIT/ML injection Inject 50 Units into the skin at bedtime.     . Multiple Vitamin (MULTIVITAMIN) capsule Take 1 capsule by mouth daily. Take 1 tab daily    . NITROSTAT 0.4 MG SL tablet Place 0.4 mg under the tongue as needed.    . rosuvastatin (CRESTOR) 20 MG tablet Take 20 mg by mouth daily.      . cholecalciferol (VITAMIN D) 1000 UNITS tablet Take 5,000 Units by mouth daily.     Marland Kitchen dicyclomine (BENTYL) 10 MG capsule Take 1 capsule (10 mg total) by mouth 3 (three) times daily before meals. (Patient not taking: Reported on 09/06/2015) 90 capsule 3  . docusate sodium (COLACE) 100 MG capsule Take 100 mg by mouth daily.    . metFORMIN (GLUCOPHAGE) 500 MG tablet Take 500 mg by mouth daily with breakfast. Reported on 06/02/2015    . Omega-3 Fatty Acids (FISH OIL) 875 MG CAPS Take 1 capsule by mouth daily.     . vitamin B-12 (CYANOCOBALAMIN) 1000 MCG tablet Take 1,000 mcg by mouth daily.       No current facility-administered medications for this visit.     Allergies  Allergen Reactions  . Demerol [Meperidine] Shortness Of Breath    Stop breathing  . Hydrocodone-Acetaminophen Shortness Of  Breath    REACTION: stop breathing  . Meperidine Hcl Shortness Of Breath    REACTION: stop breathing  . Vicodin [Hydrocodone-Acetaminophen] Shortness Of Breath    Stop breathing  . Ace Inhibitors Cough  . Codeine     REACTION: hallucination, confused  . Fentanyl Itching  . Hydromorphone Hcl     REACTION: itching, rash, nausea, vomiting  . Morphine     REACTION: vomiting  . Naproxen     REACTION: stomach lesion  . Dilaudid [Hydromorphone Hcl] Itching, Nausea And Vomiting and Rash    Social History   Social History  . Marital status: Married    Spouse name: N/A  . Number of children: N/A  . Years of education: N/A    Occupational History  . Housewife    Social History Main Topics  . Smoking status: Never Smoker  . Smokeless tobacco: Never Used  . Alcohol use No  . Drug use: No  . Sexual activity: Yes    Birth control/ protection: Surgical   Other Topics Concern  . Not on file   Social History Narrative  . No narrative on file     Review of Systems: General: negative for chills, fever, night sweats or weight changes.  Cardiovascular: negative for chest pain, dyspnea on exertion, edema, orthopnea, palpitations, paroxysmal nocturnal dyspnea or shortness of breath Dermatological: negative for rash Respiratory: negative for cough or wheezing Urologic: negative for hematuria Abdominal: negative for nausea, vomiting, diarrhea, bright red blood per rectum, melena, or hematemesis Neurologic: negative for visual changes, syncope, or dizziness All other systems reviewed and are otherwise negative except as noted above.    Blood pressure (!) 142/68, pulse 63, height 5\' 3"  (1.6 m), weight 195 lb 9.6 oz (88.7 kg).  General appearance: alert and no distress Neck: no adenopathy, no carotid bruit, no JVD, supple, symmetrical, trachea midline and thyroid not enlarged, symmetric, no tenderness/mass/nodules Lungs: clear to auscultation bilaterally Heart: regular rate and rhythm, S1, S2 normal, no murmur, click, rub or gallop Extremities: extremities normal, atraumatic, no cyanosis or edema  EKG normal sinus rhythm at 63 with ST or T-wave changes. I personally reviewed this EKG  ASSESSMENT AND PLAN:   Dyslipidemia History of dyslipidemia on statin therapy followed by her PCP  Essential hypertension History of hypertension blood pressure measured at 142/68. She is on atenolol. Continue current meds. Doesn't  Normal coronary arteries 2003 and 05/26/14 History of normal coronary arteries by cardiac catheterization in 2003 and again 05/27/14. I've communicated to the patient that I believe her chest pain  is noncardiac.  Syncope History of syncope in the past thought to be orthostatic in nature. Her blood pressure medicines have been adjusted. She's had no further syncopal episodes since I saw her 6 months ago      Lorretta Harp MD [facility], [facility] 09/06/2015 8:02 AM

## 2015-09-06 NOTE — Assessment & Plan Note (Signed)
History of dyslipidemia on statin therapy followed by her PCP 

## 2015-09-08 ENCOUNTER — Encounter (INDEPENDENT_AMBULATORY_CARE_PROVIDER_SITE_OTHER): Payer: Self-pay | Admitting: *Deleted

## 2015-09-08 ENCOUNTER — Other Ambulatory Visit (INDEPENDENT_AMBULATORY_CARE_PROVIDER_SITE_OTHER): Payer: Self-pay | Admitting: *Deleted

## 2015-09-08 DIAGNOSIS — D649 Anemia, unspecified: Secondary | ICD-10-CM

## 2015-09-08 DIAGNOSIS — R634 Abnormal weight loss: Secondary | ICD-10-CM

## 2015-09-17 DIAGNOSIS — R634 Abnormal weight loss: Secondary | ICD-10-CM | POA: Diagnosis not present

## 2015-09-17 DIAGNOSIS — D649 Anemia, unspecified: Secondary | ICD-10-CM | POA: Diagnosis not present

## 2015-09-17 LAB — CBC
HEMATOCRIT: 36 % (ref 35.0–45.0)
HEMOGLOBIN: 11.4 g/dL — AB (ref 11.7–15.5)
MCH: 26.3 pg — AB (ref 27.0–33.0)
MCHC: 31.7 g/dL — AB (ref 32.0–36.0)
MCV: 82.9 fL (ref 80.0–100.0)
MPV: 10.1 fL (ref 7.5–12.5)
Platelets: 224 10*3/uL (ref 140–400)
RBC: 4.34 MIL/uL (ref 3.80–5.10)
RDW: 15.7 % — ABNORMAL HIGH (ref 11.0–15.0)
WBC: 5.9 10*3/uL (ref 3.8–10.8)

## 2015-09-30 ENCOUNTER — Other Ambulatory Visit (INDEPENDENT_AMBULATORY_CARE_PROVIDER_SITE_OTHER): Payer: Self-pay | Admitting: *Deleted

## 2015-09-30 DIAGNOSIS — D508 Other iron deficiency anemias: Secondary | ICD-10-CM

## 2015-10-03 DIAGNOSIS — R109 Unspecified abdominal pain: Secondary | ICD-10-CM | POA: Diagnosis not present

## 2015-10-10 ENCOUNTER — Other Ambulatory Visit (INDEPENDENT_AMBULATORY_CARE_PROVIDER_SITE_OTHER): Payer: Self-pay | Admitting: *Deleted

## 2015-10-10 ENCOUNTER — Encounter (INDEPENDENT_AMBULATORY_CARE_PROVIDER_SITE_OTHER): Payer: Self-pay | Admitting: *Deleted

## 2015-10-10 DIAGNOSIS — D508 Other iron deficiency anemias: Secondary | ICD-10-CM

## 2015-10-31 DIAGNOSIS — D649 Anemia, unspecified: Secondary | ICD-10-CM | POA: Diagnosis not present

## 2015-10-31 DIAGNOSIS — E1122 Type 2 diabetes mellitus with diabetic chronic kidney disease: Secondary | ICD-10-CM | POA: Diagnosis not present

## 2015-10-31 DIAGNOSIS — Z85528 Personal history of other malignant neoplasm of kidney: Secondary | ICD-10-CM | POA: Diagnosis not present

## 2015-10-31 DIAGNOSIS — E1121 Type 2 diabetes mellitus with diabetic nephropathy: Secondary | ICD-10-CM | POA: Diagnosis not present

## 2015-10-31 DIAGNOSIS — I129 Hypertensive chronic kidney disease with stage 1 through stage 4 chronic kidney disease, or unspecified chronic kidney disease: Secondary | ICD-10-CM | POA: Diagnosis not present

## 2015-10-31 DIAGNOSIS — D631 Anemia in chronic kidney disease: Secondary | ICD-10-CM | POA: Diagnosis not present

## 2015-10-31 DIAGNOSIS — Z794 Long term (current) use of insulin: Secondary | ICD-10-CM | POA: Diagnosis not present

## 2015-10-31 DIAGNOSIS — Z79899 Other long term (current) drug therapy: Secondary | ICD-10-CM | POA: Diagnosis not present

## 2015-10-31 DIAGNOSIS — Z87442 Personal history of urinary calculi: Secondary | ICD-10-CM | POA: Diagnosis not present

## 2015-10-31 DIAGNOSIS — Z905 Acquired absence of kidney: Secondary | ICD-10-CM | POA: Diagnosis not present

## 2015-10-31 DIAGNOSIS — N189 Chronic kidney disease, unspecified: Secondary | ICD-10-CM | POA: Diagnosis not present

## 2015-10-31 DIAGNOSIS — E559 Vitamin D deficiency, unspecified: Secondary | ICD-10-CM | POA: Diagnosis not present

## 2015-10-31 DIAGNOSIS — Q600 Renal agenesis, unilateral: Secondary | ICD-10-CM | POA: Diagnosis not present

## 2015-11-10 DIAGNOSIS — E1165 Type 2 diabetes mellitus with hyperglycemia: Secondary | ICD-10-CM | POA: Diagnosis not present

## 2015-11-10 DIAGNOSIS — Z794 Long term (current) use of insulin: Secondary | ICD-10-CM | POA: Diagnosis not present

## 2015-11-10 DIAGNOSIS — N189 Chronic kidney disease, unspecified: Secondary | ICD-10-CM | POA: Diagnosis not present

## 2015-11-11 DIAGNOSIS — D508 Other iron deficiency anemias: Secondary | ICD-10-CM | POA: Diagnosis not present

## 2015-11-11 LAB — CBC
HEMATOCRIT: 36.1 % (ref 35.0–45.0)
Hemoglobin: 11.5 g/dL — ABNORMAL LOW (ref 11.7–15.5)
MCH: 25.9 pg — ABNORMAL LOW (ref 27.0–33.0)
MCHC: 31.9 g/dL — ABNORMAL LOW (ref 32.0–36.0)
MCV: 81.3 fL (ref 80.0–100.0)
MPV: 11.1 fL (ref 7.5–12.5)
PLATELETS: 214 10*3/uL (ref 140–400)
RBC: 4.44 MIL/uL (ref 3.80–5.10)
RDW: 16.6 % — AB (ref 11.0–15.0)
WBC: 4.5 10*3/uL (ref 3.8–10.8)

## 2015-11-14 ENCOUNTER — Telehealth (INDEPENDENT_AMBULATORY_CARE_PROVIDER_SITE_OTHER): Payer: Self-pay | Admitting: Internal Medicine

## 2015-11-14 NOTE — Telephone Encounter (Signed)
Patient called to report her weight, it's 186  [phone]

## 2015-11-16 NOTE — Addendum Note (Signed)
Addended by: Zebedee Iba on: 11/16/2015 09:59 AM   Modules accepted: Orders

## 2015-11-16 NOTE — Telephone Encounter (Signed)
Noted  

## 2015-11-17 ENCOUNTER — Other Ambulatory Visit (INDEPENDENT_AMBULATORY_CARE_PROVIDER_SITE_OTHER): Payer: Self-pay | Admitting: *Deleted

## 2015-11-17 DIAGNOSIS — D508 Other iron deficiency anemias: Secondary | ICD-10-CM

## 2015-11-23 DIAGNOSIS — I10 Essential (primary) hypertension: Secondary | ICD-10-CM | POA: Diagnosis not present

## 2015-11-23 DIAGNOSIS — N189 Chronic kidney disease, unspecified: Secondary | ICD-10-CM | POA: Diagnosis not present

## 2015-11-23 DIAGNOSIS — M109 Gout, unspecified: Secondary | ICD-10-CM | POA: Diagnosis not present

## 2015-11-23 DIAGNOSIS — Z79899 Other long term (current) drug therapy: Secondary | ICD-10-CM | POA: Diagnosis not present

## 2015-11-23 DIAGNOSIS — K219 Gastro-esophageal reflux disease without esophagitis: Secondary | ICD-10-CM | POA: Diagnosis not present

## 2015-11-23 DIAGNOSIS — E119 Type 2 diabetes mellitus without complications: Secondary | ICD-10-CM | POA: Diagnosis not present

## 2015-12-01 DIAGNOSIS — E785 Hyperlipidemia, unspecified: Secondary | ICD-10-CM | POA: Diagnosis not present

## 2015-12-01 DIAGNOSIS — F411 Generalized anxiety disorder: Secondary | ICD-10-CM | POA: Diagnosis not present

## 2015-12-01 DIAGNOSIS — Z23 Encounter for immunization: Secondary | ICD-10-CM | POA: Diagnosis not present

## 2015-12-01 DIAGNOSIS — N184 Chronic kidney disease, stage 4 (severe): Secondary | ICD-10-CM | POA: Diagnosis not present

## 2015-12-01 DIAGNOSIS — E1122 Type 2 diabetes mellitus with diabetic chronic kidney disease: Secondary | ICD-10-CM | POA: Diagnosis not present

## 2015-12-02 ENCOUNTER — Ambulatory Visit (INDEPENDENT_AMBULATORY_CARE_PROVIDER_SITE_OTHER): Payer: Medicare Other | Admitting: Internal Medicine

## 2015-12-02 ENCOUNTER — Encounter (INDEPENDENT_AMBULATORY_CARE_PROVIDER_SITE_OTHER): Payer: Self-pay | Admitting: Internal Medicine

## 2015-12-02 VITALS — BP 128/70 | HR 72 | Temp 98.0°F | Ht 63.0 in | Wt 189.1 lb

## 2015-12-02 DIAGNOSIS — R634 Abnormal weight loss: Secondary | ICD-10-CM | POA: Diagnosis not present

## 2015-12-02 DIAGNOSIS — R630 Anorexia: Secondary | ICD-10-CM

## 2015-12-02 NOTE — Progress Notes (Signed)
   Subjective:    Patient ID: Margaret Huerta, female    DOB: 1945/11/12, 70 y.o.   MRN: [id number]  HPI Here today for f/u.  Hx of type 2 diabetes. Hx of nausea and anorexia. Her last weight in office was 191 in April. On 11/14/2015 her weight was 186 (telephone call). She states she feels better. She is eating. Her appetite is a little better. She says she does get hungry at times. She is eating some red meat. She does not want to gain any more weight.  She had gastric bypass in 2012.  She usually has a BM daily.    CBC    Component Value Date/Time   WBC 4.5 11/11/2015 1125   RBC 4.44 11/11/2015 1125   HGB 11.5 (L) 11/11/2015 1125   HCT 36.1 11/11/2015 1125   PLT 214 11/11/2015 1125   MCV 81.3 11/11/2015 1125   MCH 25.9 (L) 11/11/2015 1125   MCHC 31.9 (L) 11/11/2015 1125   RDW 16.6 (H) 11/11/2015 1125   LYMPHSABS 1,302 06/02/2015 1127   MONOABS 434 06/02/2015 1127   EOSABS 186 06/02/2015 1127   BASOSABS 62 06/02/2015 1127  '  1013/2016: EGD & Colonoscopy  Indications: Patient is 70 year old Caucasian female with multiple medical problems including long-standing type 2 diabetes mellitus who presents with over 9 month history of nausea and anorexia 40 pound weight loss and diarrhea. She has history of colonic polyp. She had sessile serrated polyp removed 5 years ago.  Impression:  EGD findings: Small gastric pouch with patent gastrojejunostomy. Short afferent limb of Roux-en-Y anastomosis. Normal jejunal mucosa for up to 30 cm. Random biopsies taken from small bowel mucosa distal to gastrojejunostomy.  Colonoscopy findings: Examination performed to cecum. 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 No evidence of recurrent polyps or endoscopic colitis. Random biopsies taken from mucosa of sigmoid colon. Small internal and external  hemorrhoids.  Review of Systems     Objective:   Physical Exam Blood pressure 128/70, pulse 72, temperature 98 F (36.7 C), height 5\' 3"  (1.6 m), weight 189 lb 1.6 oz (85.8 kg). Alert and oriented. Skin warm and dry. Oral mucosa is moist.   . Sclera anicteric, conjunctivae is pink. Thyroid not enlarged. No cervical lymphadenopathy. Lungs clear. Heart regular rate and rhythm.  Abdomen is soft. Bowel sounds are positive. No hepatomegaly. No abdominal masses felt. No tenderness.  No edema to lower extremities.          Assessment & Plan:     Weight loss. She had a normal EGD and colonoscopy. Hx of gastric bypass in 2012 in [place].  She is maintaining her weight. She feels better.  Ov in 6 months.

## 2015-12-02 NOTE — Patient Instructions (Signed)
Weight loss. She is maintaining her weight. She feels better. Metformin discontinued.

## 2016-01-18 ENCOUNTER — Other Ambulatory Visit (INDEPENDENT_AMBULATORY_CARE_PROVIDER_SITE_OTHER): Payer: Self-pay | Admitting: Internal Medicine

## 2016-01-18 DIAGNOSIS — R197 Diarrhea, unspecified: Secondary | ICD-10-CM

## 2016-02-08 ENCOUNTER — Other Ambulatory Visit (INDEPENDENT_AMBULATORY_CARE_PROVIDER_SITE_OTHER): Payer: Self-pay | Admitting: *Deleted

## 2016-02-08 ENCOUNTER — Encounter (INDEPENDENT_AMBULATORY_CARE_PROVIDER_SITE_OTHER): Payer: Self-pay | Admitting: *Deleted

## 2016-02-08 DIAGNOSIS — D508 Other iron deficiency anemias: Secondary | ICD-10-CM

## 2016-02-17 DIAGNOSIS — D508 Other iron deficiency anemias: Secondary | ICD-10-CM | POA: Diagnosis not present

## 2016-02-17 LAB — HEMOGLOBIN AND HEMATOCRIT, BLOOD
HEMATOCRIT: 36.9 % (ref 35.0–45.0)
HEMOGLOBIN: 11.6 g/dL — AB (ref 11.7–15.5)

## 2016-02-20 ENCOUNTER — Other Ambulatory Visit (INDEPENDENT_AMBULATORY_CARE_PROVIDER_SITE_OTHER): Payer: Self-pay | Admitting: *Deleted

## 2016-02-20 DIAGNOSIS — D508 Other iron deficiency anemias: Secondary | ICD-10-CM

## 2016-03-07 IMAGING — CT CT HEAD W/O CM
3 of 5 series · 15 of 47 positions shown, 18 images · non-contrast
Comparison: None.

CLINICAL DATA: Fall, left face bruising

EXAM:
CT HEAD WITHOUT CONTRAST
CT MAXILLOFACIAL WITHOUT CONTRAST
TECHNIQUE: Multidetector CT imaging of the head and maxillofacial structures
were performed using the standard protocol without intravenous
contrast. Multiplanar CT image reconstructions of the maxillofacial
structures were also generated.

[Series 4: max st 2.0 h31s · axial · 0.32mm/px · z∈[+30,+170]mm · 9 of 84 slices shown, 12 images]
[im 7/84  brain]
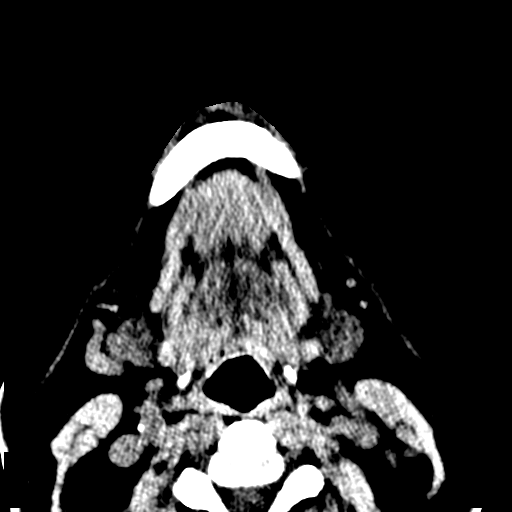
[im 7/84  bone]
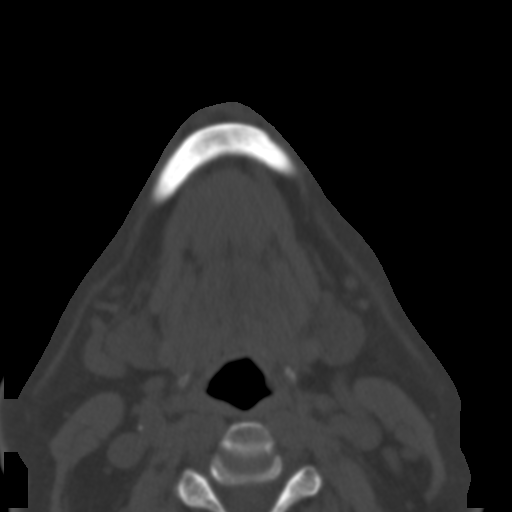
[im 20/84  brain]
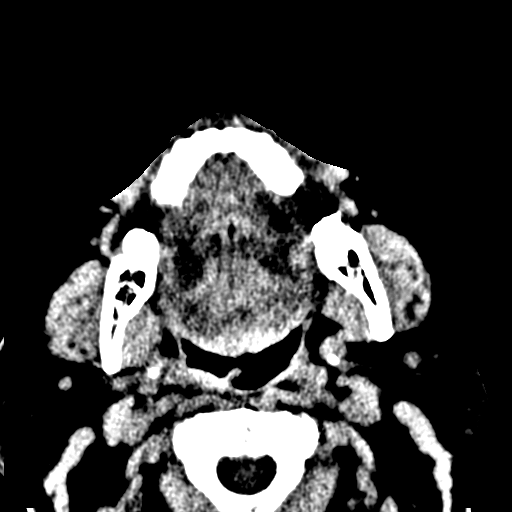
[im 26/84  brain]
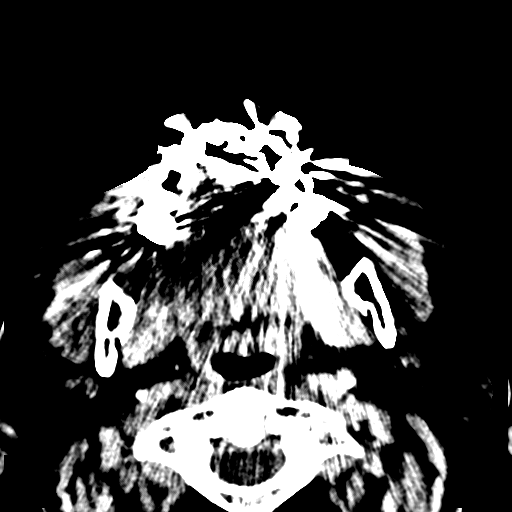
[im 32/84  brain]
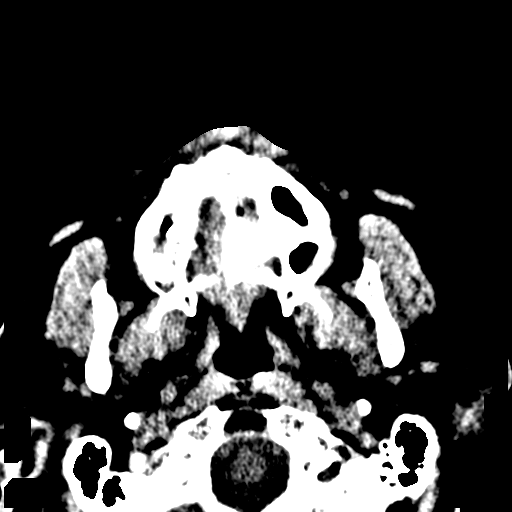
[im 45/84  brain]
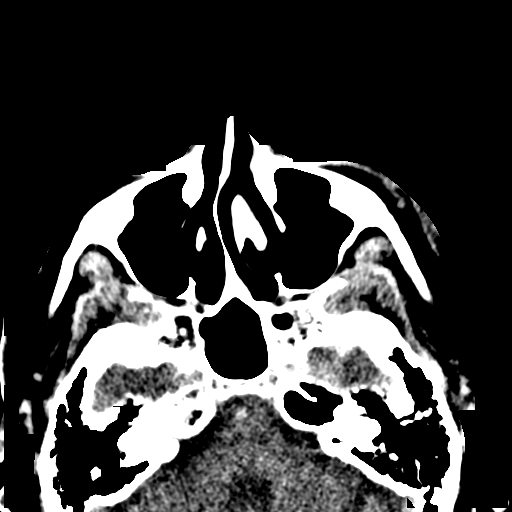
[im 45/84  bone]
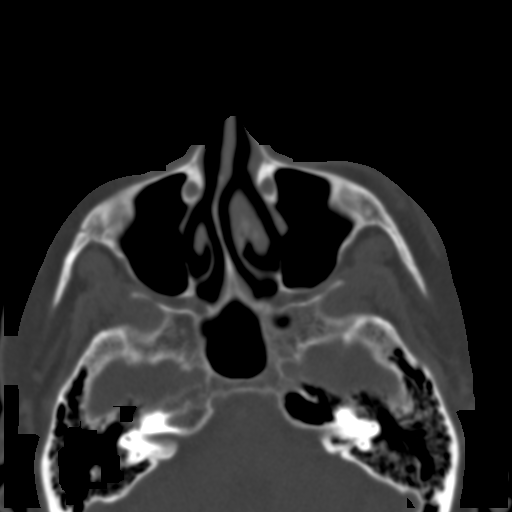
[im 52/84  brain]
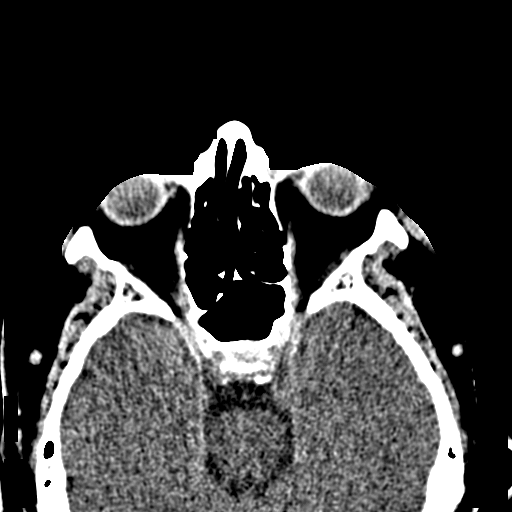
[im 58/84  brain]
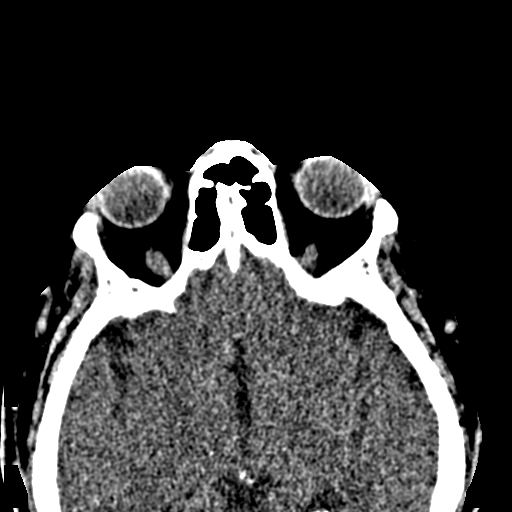
[im 71/84  brain]
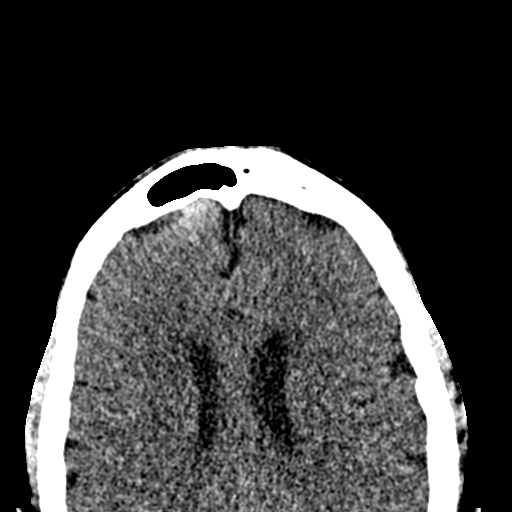
[im 77/84  brain]
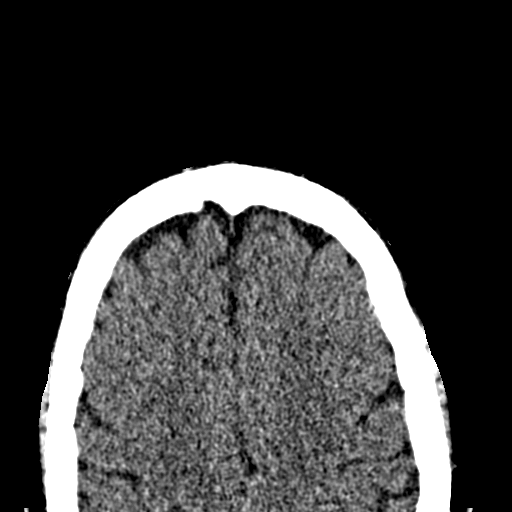
[im 77/84  bone]
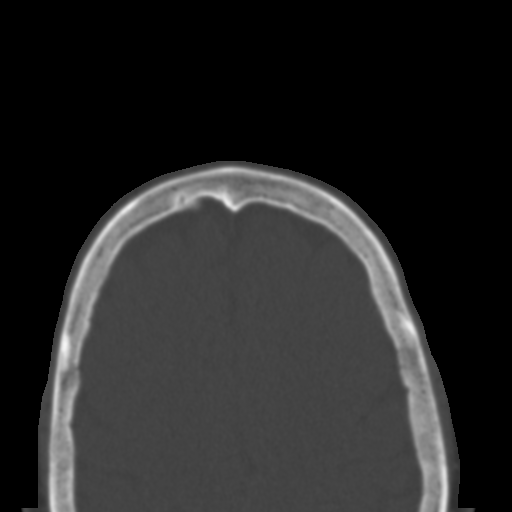

[Series 6: max st coronal · coronal · 0.34mm/px · 3 of 71 slices shown]
[im 24/71  brain]
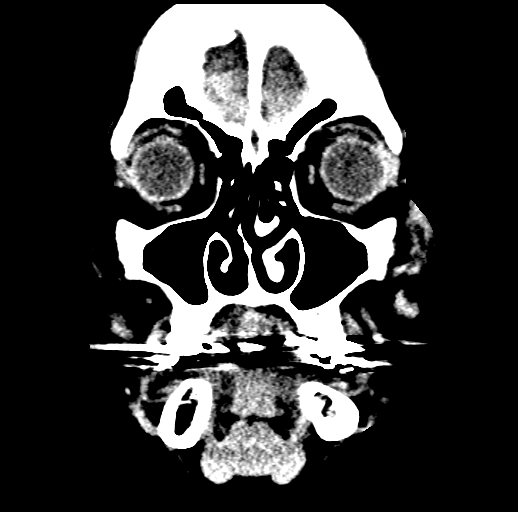
[im 32/71  brain]
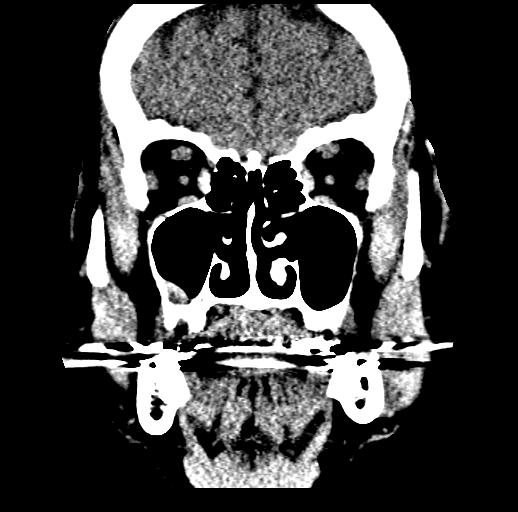
[im 39/71  brain]
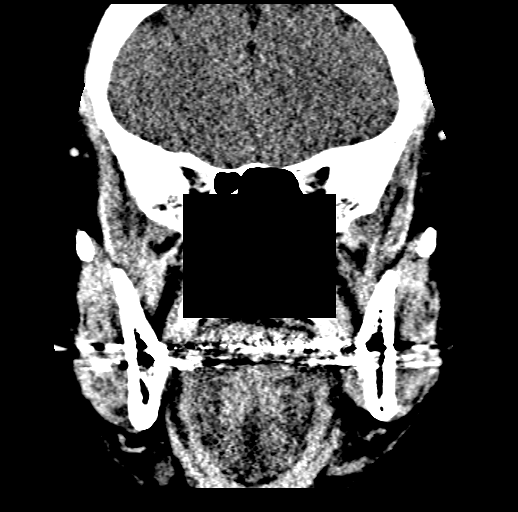

[Series 7: max st sag · sagittal · 0.29mm/px · 3 of 86 slices shown]
[im 29/86  brain]
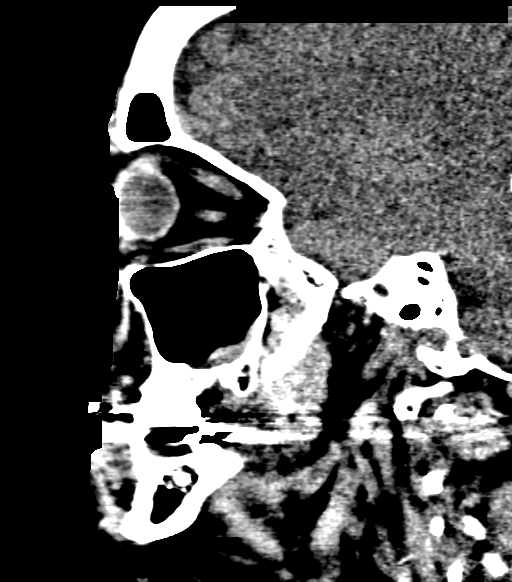
[im 43/86  brain]
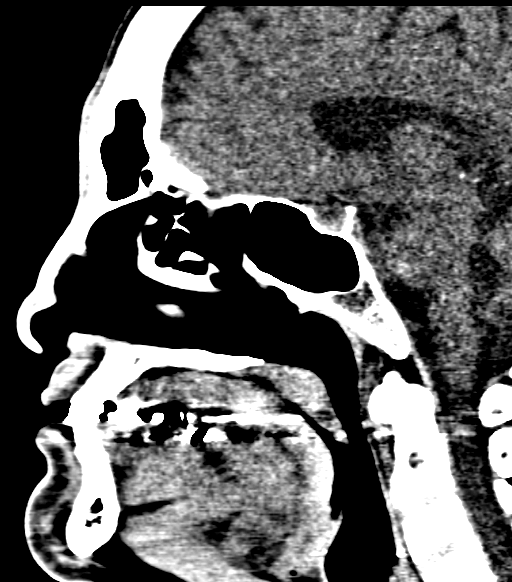
[im 57/86  brain]
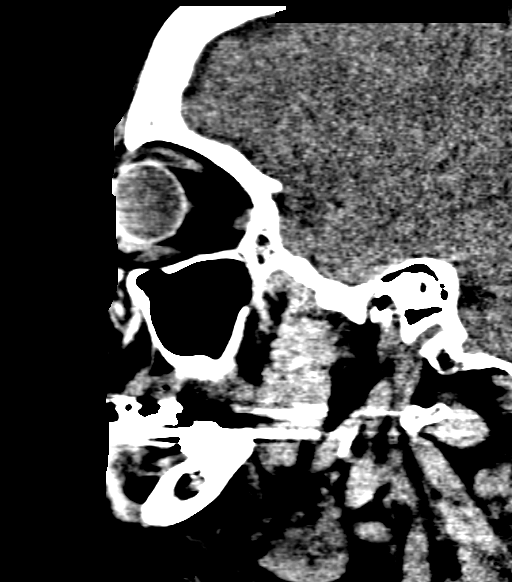

[15 of 47 positions shown; findings below may reference images not displayed]

FINDINGS: CT HEAD FINDINGS

No skull fracture is noted. Paranasal sinuses and mastoid air cells
are unremarkable.

No intracranial hemorrhage, mass effect or midline shift. No acute
infarction. Mild periventricular white matter decreased attenuation
probable due to chronic small vessel ischemic changes. No mass
lesion is noted on this unenhanced scan.

CT MAXILLOFACIAL FINDINGS

Mild mucosal thickening posterior inferior aspect bilateral
maxillary sinus. No nasal bone fracture is noted. No intraorbital
hematoma. No zygomatic fracture is noted. There is mild soft tissue
swelling and mild subcutaneous stranding left face infraorbital. No
facial fluid collection. Metallic dental artifacts are noted
mandibular region and maxillary region.

No paranasal sinuses air-fluid levels.

There is right nasal septum deviation. Bilateral semilunar canal is
patent. No orbital rim or orbital floor fracture. There is no TMJ
dislocation. No mandibular fracture is noted.

Sagittal images shows no maxillary spine fracture. The
nasopharyngeal and oropharyngeal airway is patent. Mild degenerative
changes C1-C2 articulation.
IMPRESSION: 1. No acute intracranial abnormality.
2. Mild periventricular white matter decreased attenuation probable
due to chronic small vessel ischemic changes.
3. No facial fractures are noted. No zygomatic fracture. No orbital
rim or orbital floor fracture. No intraorbital hematoma.
4. There is mild soft tissue swelling and subcutaneous stranding
left face infraorbital region. No facial fluid collection.
5. Mild mucosal thickening inferior posterior aspect bilateral
maxillary sinus. No paranasal sinuses air-fluid levels.
6. Patent oropharyngeal and nasopharyngeal airway.
7. There is right nasal septum deviation.

## 2016-03-08 DIAGNOSIS — Z79899 Other long term (current) drug therapy: Secondary | ICD-10-CM | POA: Diagnosis not present

## 2016-03-08 DIAGNOSIS — E119 Type 2 diabetes mellitus without complications: Secondary | ICD-10-CM | POA: Diagnosis not present

## 2016-03-08 DIAGNOSIS — I10 Essential (primary) hypertension: Secondary | ICD-10-CM | POA: Diagnosis not present

## 2016-03-08 DIAGNOSIS — N183 Chronic kidney disease, stage 3 (moderate): Secondary | ICD-10-CM | POA: Diagnosis not present

## 2016-03-08 DIAGNOSIS — M109 Gout, unspecified: Secondary | ICD-10-CM | POA: Diagnosis not present

## 2016-03-15 DIAGNOSIS — Z6834 Body mass index (BMI) 34.0-34.9, adult: Secondary | ICD-10-CM | POA: Diagnosis not present

## 2016-03-15 DIAGNOSIS — E1122 Type 2 diabetes mellitus with diabetic chronic kidney disease: Secondary | ICD-10-CM | POA: Diagnosis not present

## 2016-03-15 DIAGNOSIS — N184 Chronic kidney disease, stage 4 (severe): Secondary | ICD-10-CM | POA: Diagnosis not present

## 2016-03-15 DIAGNOSIS — G43909 Migraine, unspecified, not intractable, without status migrainosus: Secondary | ICD-10-CM | POA: Diagnosis not present

## 2016-04-04 ENCOUNTER — Encounter: Payer: Self-pay | Admitting: Neurology

## 2016-04-04 ENCOUNTER — Ambulatory Visit (INDEPENDENT_AMBULATORY_CARE_PROVIDER_SITE_OTHER): Payer: Medicare Other | Admitting: Neurology

## 2016-04-04 VITALS — BP 124/77 | HR 70 | Resp 18 | Ht 63.0 in | Wt 189.5 lb

## 2016-04-04 DIAGNOSIS — M7552 Bursitis of left shoulder: Secondary | ICD-10-CM

## 2016-04-04 DIAGNOSIS — F329 Major depressive disorder, single episode, unspecified: Secondary | ICD-10-CM | POA: Diagnosis not present

## 2016-04-04 DIAGNOSIS — R200 Anesthesia of skin: Secondary | ICD-10-CM

## 2016-04-04 DIAGNOSIS — E1142 Type 2 diabetes mellitus with diabetic polyneuropathy: Secondary | ICD-10-CM

## 2016-04-04 DIAGNOSIS — IMO0002 Reserved for concepts with insufficient information to code with codable children: Secondary | ICD-10-CM | POA: Insufficient documentation

## 2016-04-04 DIAGNOSIS — R269 Unspecified abnormalities of gait and mobility: Secondary | ICD-10-CM

## 2016-04-04 DIAGNOSIS — G4489 Other headache syndrome: Secondary | ICD-10-CM | POA: Diagnosis not present

## 2016-04-04 DIAGNOSIS — G43109 Migraine with aura, not intractable, without status migrainosus: Secondary | ICD-10-CM | POA: Diagnosis not present

## 2016-04-04 DIAGNOSIS — E1140 Type 2 diabetes mellitus with diabetic neuropathy, unspecified: Secondary | ICD-10-CM | POA: Insufficient documentation

## 2016-04-04 DIAGNOSIS — M542 Cervicalgia: Secondary | ICD-10-CM | POA: Insufficient documentation

## 2016-04-04 DIAGNOSIS — G43709 Chronic migraine without aura, not intractable, without status migrainosus: Secondary | ICD-10-CM | POA: Insufficient documentation

## 2016-04-04 DIAGNOSIS — F32A Depression, unspecified: Secondary | ICD-10-CM

## 2016-04-04 NOTE — Progress Notes (Signed)
[facility]  PATIENT: Margaret Huerta DOB: 07-05-1945  REFERRING DOCTOR OR PCP:  [name] SOURCE: patient, notes from Dr. Willey Blade, labs/imaging reports, 2016 CT on PACS  _________________________________   HISTORICAL  CHIEF COMPLAINT:  Chief Complaint  Patient presents with  . Headache    Margaret Huerta is here for eval of headaches.  Sts. she has also had migraines,  helped with Atenolol.  Sts. in the last 6 mos. she has had a constant left sided h/a that is different from her usual migraine.  Sts. husband tells her she now leans to the left when she walks.  She also c/o numbness/tingling left 3rd, 4th and 5th digits, occasionally extending to elbow.  Hx. of surgery left elbow ? ulnar nerve on 03-16-15 by [facility]/fim  . Numbness  . Gait Disturbance    HISTORY OF PRESENT ILLNESS:  I had the pleasure seeing you patient, Margaret Huerta, at [facility] for neurologic consultation regarding her headaches.  She is a 71 year old woman with a long history of migraine headaches who has had a more constant left sided headache for the last 6 months.  She has had migraine headaches with aura since age 37.  They usually last one day and occur once a month.    When one occurs, she gets throbbing pain associated with nausea (used to vomit not x many years), photophobia and phonophobia.   Moving worsens the pain.  Laying down in a dark room helps.    If she takes Tylenol when the aura occurs the pain is much milder.   Imitrex has not helped.   She notes stress may trigger one.   Atenolol has helped.     She has had a different type of headache the past 6-7 months.  Pain is always present but fluctuates from mild to moderate (never as bad as migraines).    She has more constant achy left sided pain from the occiput to the forehead.   She has left sided neck and shoulder pain.   Moving or staying still does not alter the pain much.   She does not get nausea or  photo/phonophobia.    When headache gets more intense she takes a Tylenol but benefit is not very great.   She can't take NSAID's due to her kidney disease.     In the past, before other pain issues, she had been placed on gabapentin but could not tolerate it. She was placed on Lyrica and felt better but she gained weight.  She gets left hand numbness that comes and goes.    She had a left ulnar transposition in the past.   She also has left sided neck and shoulder pain.     Neck pain is worse when she wakes up and it improves with movements.   She sleeps on her right side or back.   She also noted mild left hand weakness.   These symptoms all worsened 6 months ago.    Also, for the past 6 months she veers to the left as she walks.   She has hit the left side of door jambs and display cases in stores as she walks.      She had a CT scan 05/04/2014 after a head injury. It shows no acute intracranial abnormality though there was some mild periventricular white matter changes most consistent with chronic microvascular ischemic change related to aging. He has sequela of the fall including left facial soft tissue injury  but no fractures.   Recent lab results showed a creatinine 1.3 (has mild chronic kidney disease).  Hemoglobin A1c is 8.0.   REVIEW OF SYSTEMS: Constitutional: No fevers, chills, sweats, or change in appetite Eyes: No visual changes, double vision, eye pain Ear, nose and throat: No hearing loss, ear pain, nasal congestion, sore throat Cardiovascular: No chest pain, palpitations Respiratory: No shortness of breath at rest or with exertion.   No wheezes GastrointestinaI: No nausea, vomiting, diarrhea, abdominal pain, fecal incontinence Genitourinary: No dysuria, urinary retention or frequency.  No nocturia. Musculoskeletal: No neck pain, back pain Integumentary: No rash, pruritus, skin lesions Neurological: as above Psychiatric: No depression at this time.  No anxiety Endocrine: No  palpitations, diaphoresis, change in appetite, change in weigh or increased thirst Hematologic/Lymphatic: No anemia, purpura, petechiae. Allergic/Immunologic: No itchy/runny eyes, nasal congestion, recent allergic reactions, rashes  ALLERGIES: Allergies  Allergen Reactions  . Demerol [Meperidine] Shortness Of Breath    Stop breathing  . Hydrocodone-Acetaminophen Shortness Of Breath    REACTION: stop breathing  . Meperidine Hcl Shortness Of Breath    REACTION: stop breathing  . Vicodin [Hydrocodone-Acetaminophen] Shortness Of Breath    Stop breathing  . Ace Inhibitors Cough  . Codeine     REACTION: hallucination, confused  . Fentanyl Itching  . Hydromorphone Hcl     REACTION: itching, rash, nausea, vomiting  . Morphine     REACTION: vomiting  . Naproxen     REACTION: stomach lesion  . Dilaudid [Hydromorphone Hcl] Itching, Nausea And Vomiting and Rash    HOME MEDICATIONS:  Current Outpatient Prescriptions:  .  acetaminophen (TYLENOL) 325 MG tablet, Take 2 tablets (650 mg total) by mouth every 4 (four) hours as needed for headache or mild pain., Disp: , Rfl:  .  aspirin 81 MG tablet, Take 81 mg by mouth daily., Disp: , Rfl:  .  atenolol (TENORMIN) 50 MG tablet, TAKE ONE TABLET BY MOUTH ONCE DAILY (Patient taking differently: TAKE ONE TABLET BY MOUTH twice a day), Disp: 30 tablet, Rfl: 7 .  cholecalciferol (VITAMIN D) 1000 UNITS tablet, Take 5,000 Units by mouth daily. , Disp: , Rfl:  .  dicyclomine (BENTYL) 10 MG capsule, TAKE ONE CAPSULE BY MOUTH THREE TIMES DAILY BEFORE MEAL(S), Disp: 90 capsule, Rfl: 3 .  DULoxetine (CYMBALTA) 60 MG capsule, Take 60 mg by mouth daily.  , Disp: , Rfl:  .  furosemide (LASIX) 40 MG tablet, Take 0.5 tablets (20 mg total) by mouth daily. Take 40 mg in the AM and 20 mg in the PM (Patient taking differently: Take 40 mg by mouth 2 (two) times daily. Take 40 mg in the AM and 20 mg in the PM), Disp: 30 tablet, Rfl:  .  insulin glargine (LANTUS) 100  UNIT/ML injection, Inject 65 Units into the skin at bedtime. , Disp: , Rfl:  .  Multiple Vitamin (MULTIVITAMIN) capsule, Take 1 capsule by mouth daily. Take 1 tab daily, Disp: , Rfl:  .  rosuvastatin (CRESTOR) 20 MG tablet, Take 20 mg by mouth daily.  , Disp: , Rfl:  .  vitamin B-12 (CYANOCOBALAMIN) 1000 MCG tablet, Take 1,000 mcg by mouth daily.  , Disp: , Rfl:  .  diphenhydrAMINE (BENADRYL) 25 mg capsule, Take 25 mg by mouth 2 (two) times daily as needed., Disp: , Rfl:  .  docusate sodium (COLACE) 100 MG capsule, Take 100 mg by mouth daily., Disp: , Rfl:  .  NITROSTAT 0.4 MG SL tablet, Place 0.4 mg under  the tongue as needed., Disp: , Rfl:   PAST MEDICAL HISTORY: Past Medical History:  Diagnosis Date  . Anemia   . Anxiety   . Cervical cancer ([place])   . Chronic bronchitis ([place])    "get it q yr"  . Chronic lower back pain   . Depression   . Febrile seizure ([place])    "as a child"  . Fibromyalgia   . GERD (gastroesophageal reflux disease)   . Gout   . History of blood transfusion    "S/P tonsillectomy"  . History of hiatal hernia   . Hx of cardiovascular stress test April 2016   false positive Myoview  . Hyperlipidemia   . Hypertension   . Migraine    "monthly" (05/26/2014)  . Neuropathy ([place])   . Osteoporosis   . Parathyroid disease ([place])    "my PHT levels run high; I can't take calcium"  . Pneumonia "several times"  . Renal cancer ([place])    "right"-s/p nephrectomy  . Renal insufficiency    "left kidney works at 40-60%" (05/26/2014)  . Sleep apnea    "wore mask for 2 months; could not take it" (05/26/2014)  . Type II diabetes mellitus ([place])   . Typhus fever    "as a child"    PAST SURGICAL HISTORY: Past Surgical History:  Procedure Laterality Date  . ABDOMINAL HERNIA REPAIR  02/2013   "in [place]"  . ABDOMINAL HYSTERECTOMY  1977   "partial"  . APPENDECTOMY    . BACK SURGERY    . BILATERAL SALPINGOOPHORECTOMY Bilateral ~ 1993  . CARDIAC CATHETERIZATION  2003, April  2016   normal coronaries  . CATARACT EXTRACTION W/ INTRAOCULAR LENS  IMPLANT, BILATERAL  2015  . COLONOSCOPY N/A 11/25/2014   Procedure: COLONOSCOPY;  Surgeon: Rogene Houston, MD;  [facility];  Service: Endoscopy;  Laterality: N/A;  . CYSTOSCOPY W/ [place] MANIPULATION  1988  . DIAGNOSTIC LAPAROSCOPY  1975  . DILATION AND CURETTAGE OF UTERUS    . ESOPHAGOGASTRODUODENOSCOPY N/A 11/25/2014   Procedure: ESOPHAGOGASTRODUODENOSCOPY (EGD);  Surgeon: Rogene Houston, MD;  [facility];  Service: Endoscopy;  Laterality: N/A;  9:30 - moved to 11:25 - [name] to notify pt  . ESOPHAGOGASTRODUODENOSCOPY (EGD) WITH ESOPHAGEAL DILATION  1997  . EYE SURGERY    . GASTRIC BYPASS  2012  . HERNIA REPAIR     2015  . HIATAL HERNIA REPAIR  1996  . INCONTINENCE SURGERY  1983  . KNEE ARTHROSCOPY Left 2000's  . LAPAROSCOPIC CHOLECYSTECTOMY  1990's  . LEFT HEART CATHETERIZATION WITH CORONARY ANGIOGRAM N/A 05/27/2014   Procedure: LEFT HEART CATHETERIZATION WITH CORONARY ANGIOGRAM;  Surgeon: Lorretta Harp, MD;  Location: [facility];  Service: Cardiovascular;  Laterality: N/A;  . LUMBAR [place] SURGERY  2001   "herniated discs"  . [place]   "drilled into gum and put tooth implants uppers"  . NEPHRECTOMY Right 2002   "cancer"  . TMJ ARTHROPLASTY  1988  . TONSILLECTOMY  1976  . UPPER GI ENDOSCOPY      FAMILY HISTORY: Family History  Problem Relation Age of Onset  . Emphysema Father   . Heart disease Father   . Clotting disorder Mother   . Diabetes Mother   . Kidney disease Mother   . Allergies      whole family per pt  . Lung cancer Maternal Uncle   . Alcohol abuse Maternal Uncle     SOCIAL HISTORY:  Social History  Social History  . Marital status: Married    Spouse name: N/A  . Number of children: N/A  . Years of education: N/A   Occupational History  . Housewife    Social History Main Topics  . Smoking status: Never Smoker  . Smokeless tobacco: Never  Used  . Alcohol use No  . Drug use: No  . Sexual activity: Yes    Birth control/ protection: Surgical   Other Topics Concern  . Not on file   Social History Narrative  . No narrative on file     PHYSICAL EXAM  Vitals:   04/04/16 0852  BP: 124/77  Pulse: 70  Resp: 18  Weight: 189 lb 8 oz (86 kg)  Height: 5\' 3"  (1.6 m)    Body mass index is 33.57 kg/m.   General: The patient is well-developed and well-nourished and in no acute distress  Eyes:  Funduscopic exam shows normal optic discs and retinal vessels.  Neck: The neck is supple, no carotid bruits are noted.  The neck is  Tender over the left much more than right occiput. There is also milder tenderness over the left greater than right mid cervical paraspinal muscles and the trapezius muscle..  Cardiovascular: The heart has a regular rate and rhythm with a normal S1 and S2. There were no murmurs, gallops or rubs. Lungs are clear to auscultation.  Skin: Extremities are without significant edema.  Musculoskeletal:  Back is mildly tender.  She has moderate tenderness over the subacromial bursa on the left. Reduced range of motion with elevation and external rotation.  Neurologic Exam  Mental status: The patient is alert and oriented x 3 at the time of the examination. The patient has apparent normal recent and remote memory, with an apparently normal attention span and concentration ability.   Speech is normal.  Cranial nerves: Extraocular movements are full. Pupils are equal, round, and reactive to light and accomodation.  Visual fields are full.  Facial symmetry is present. There is good facial sensation to soft touch bilaterally.Facial strength is normal.  Trapezius and sternocleidomastoid strength is normal. No dysarthria is noted.  The tongue is midline, and the patient has symmetric elevation of the soft palate. No obvious hearing deficits are noted.  Motor:  Muscle bulk is normal.   Tone is normal. Strength is  5 /  5 in all 4 extremities except 4+/5 EHL in both feet.    Sensory: Sensory examination, she reported reduced touch sensation in the left thenar eminence. She had a Tinel sign at the left wrist she had a Phalen sign into the left hand. Vibration sensation was reduced moderately in the toes compared to the knees. Sensation was reduced mildly at the ankles to vibration  Coordination: Cerebellar testing reveals good finger-nose-finger and heel-to-shin bilaterally.  Gait and station: Station is normal.   Gait is normal. Tandem gait is wide. Romberg is borderline.   Reflexes: Deep tendon reflexes are symmetric and normal bilaterally.   Plantar responses are flexor.    DIAGNOSTIC DATA (LABS, IMAGING, TESTING) - I reviewed patient records, labs, notes, testing and imaging myself where available.  Lab Results  Component Value Date   WBC 4.5 11/11/2015   HGB 11.6 (L) 02/17/2016   HCT 36.9 02/17/2016   MCV 81.3 11/11/2015   PLT 214 11/11/2015      Component Value Date/Time   NA 138 07/05/2014 1110   K 3.7 07/05/2014 1110   CL 104 07/05/2014 1110   CO2 25  07/05/2014 1110   GLUCOSE 112 (H) 07/05/2014 1110   BUN 42 (H) 07/05/2014 1110   CREATININE 2.40 (H) 07/05/2014 1110   CALCIUM 9.8 07/05/2014 1110   GFRNONAA 16 (L) 06/22/2014 1209   GFRAA 19 (L) 06/22/2014 1209   No results found for: CHOL, HDL, LDLCALC, LDLDIRECT, TRIG, CHOLHDL No results found for: HGBA1C Lab Results  Component Value Date   VITAMINB12 1,134 (H) 11/25/2014   Lab Results  Component Value Date   TSH 1.80 05/12/2014       ASSESSMENT AND PLAN  Depression, unspecified depression type  Migraine with aura and without status migrainosus, not intractable  Neck pain  Other headache syndrome  Gait disturbance  Diabetic polyneuropathy associated with type 2 diabetes mellitus (HCC)  Subacromial bursitis of left shoulder joint   In summary, Abi Shoults is a 71 year old woman with migraine headaches who has  had a different type of daily headache for the past 6 months. Associated with the headache she also has had difficulties with gait disturbance and left arm numbness.    Due to her neurologic symptoms, we need to determine if there is a pathologic process such as neoplasm, stroke or inflammation and we will check an MRI of the brain to better assess. Additionally, because of the gait disturbance and left arm numbness we will check an MRI of the cervical spine to rule out a myelopathy or other process. She also appears to have a left subacromial bursitis. She had very poorly controlled sugars after a joint injection in the past and prefers not to have a bursa injection. Imipramine was offered to help with her pain but she would prefer to hold off a treatment at this time.   She also appears to have a mild diabetic polyneuropathy with mild numbness in her feet. This is not painful and no additional treatment as needed for this.  We will call her with the results of the MRIs that she will return to see me in 2 months for reevaluation. She should call us sooner if there are any new or worsening neurologic symptoms.  Thank you for asking me to see Mrs. Gapinski for a neurologic consultation. Please let me know if I can be of further assistance with her or other patients in the future.    Richard A. Felecia Shelling, MD, PhD 2/29/7989, 2:11 AM Certified in Neurology, Clinical Neurophysiology, Sleep Medicine, Pain Medicine and Neuroimaging  [facility] [address] [place], [place] [address] [phone]

## 2016-04-12 ENCOUNTER — Ambulatory Visit
Admission: RE | Admit: 2016-04-12 | Discharge: 2016-04-12 | Disposition: A | Discharge status: Home / Self Care | Payer: Medicare Other | Source: Ambulatory Visit | Attending: Neurology | Admitting: Neurology

## 2016-04-12 DIAGNOSIS — R269 Unspecified abnormalities of gait and mobility: Secondary | ICD-10-CM

## 2016-04-12 DIAGNOSIS — M542 Cervicalgia: Secondary | ICD-10-CM

## 2016-04-12 DIAGNOSIS — R200 Anesthesia of skin: Secondary | ICD-10-CM

## 2016-04-12 DIAGNOSIS — R51 Headache: Secondary | ICD-10-CM | POA: Diagnosis not present

## 2016-04-12 DIAGNOSIS — G4489 Other headache syndrome: Secondary | ICD-10-CM | POA: Diagnosis not present

## 2016-04-16 ENCOUNTER — Telehealth: Payer: Self-pay | Admitting: *Deleted

## 2016-04-16 NOTE — Telephone Encounter (Signed)
-----   Message from Britt Bottom, MD sent at 04/13/2016 10:29 PM EST ----- MRI of the cervical spine is normal for age with just 1 small disc protrusion at C4-C5 but does not lead to any nerve root impingement.  MRI of the brain shows age related changes but nothing looks serious or new.

## 2016-04-16 NOTE — Telephone Encounter (Signed)
I have spoken with Margaret Huerta this afternoon and per RAS, reviewed MRI results as below.  She verbalized understanding of same/fim

## 2016-05-23 DIAGNOSIS — Z85528 Personal history of other malignant neoplasm of kidney: Secondary | ICD-10-CM | POA: Diagnosis not present

## 2016-05-23 DIAGNOSIS — N183 Chronic kidney disease, stage 3 (moderate): Secondary | ICD-10-CM | POA: Diagnosis not present

## 2016-05-23 DIAGNOSIS — Z9049 Acquired absence of other specified parts of digestive tract: Secondary | ICD-10-CM | POA: Diagnosis not present

## 2016-05-23 DIAGNOSIS — E1122 Type 2 diabetes mellitus with diabetic chronic kidney disease: Secondary | ICD-10-CM | POA: Diagnosis not present

## 2016-05-23 DIAGNOSIS — Z87442 Personal history of urinary calculi: Secondary | ICD-10-CM | POA: Diagnosis not present

## 2016-05-23 DIAGNOSIS — Z9884 Bariatric surgery status: Secondary | ICD-10-CM | POA: Diagnosis not present

## 2016-05-23 DIAGNOSIS — I129 Hypertensive chronic kidney disease with stage 1 through stage 4 chronic kidney disease, or unspecified chronic kidney disease: Secondary | ICD-10-CM | POA: Diagnosis not present

## 2016-05-23 DIAGNOSIS — Z794 Long term (current) use of insulin: Secondary | ICD-10-CM | POA: Diagnosis not present

## 2016-05-23 DIAGNOSIS — Z905 Acquired absence of kidney: Secondary | ICD-10-CM | POA: Diagnosis not present

## 2016-05-23 DIAGNOSIS — Z79899 Other long term (current) drug therapy: Secondary | ICD-10-CM | POA: Diagnosis not present

## 2016-05-23 DIAGNOSIS — E559 Vitamin D deficiency, unspecified: Secondary | ICD-10-CM | POA: Diagnosis not present

## 2016-05-31 DIAGNOSIS — H35073 Retinal telangiectasis, bilateral: Secondary | ICD-10-CM | POA: Diagnosis not present

## 2016-05-31 DIAGNOSIS — E113293 Type 2 diabetes mellitus with mild nonproliferative diabetic retinopathy without macular edema, bilateral: Secondary | ICD-10-CM | POA: Diagnosis not present

## 2016-05-31 DIAGNOSIS — H35359 Cystoid macular degeneration, unspecified eye: Secondary | ICD-10-CM | POA: Diagnosis not present

## 2016-05-31 DIAGNOSIS — H3561 Retinal hemorrhage, right eye: Secondary | ICD-10-CM | POA: Diagnosis not present

## 2016-06-01 ENCOUNTER — Ambulatory Visit (INDEPENDENT_AMBULATORY_CARE_PROVIDER_SITE_OTHER): Payer: Medicare Other | Admitting: Internal Medicine

## 2016-06-01 ENCOUNTER — Encounter (INDEPENDENT_AMBULATORY_CARE_PROVIDER_SITE_OTHER): Payer: Self-pay | Admitting: Internal Medicine

## 2016-06-01 VITALS — BP 128/68 | HR 60 | Temp 98.1°F | Wt 191.7 lb

## 2016-06-01 DIAGNOSIS — R634 Abnormal weight loss: Secondary | ICD-10-CM

## 2016-06-01 NOTE — Progress Notes (Signed)
Subjective:    Patient ID: Margaret Huerta, female    DOB: 01-12-1946, 71 y.o.   MRN: [id number]  HPI Here today for f/u. She was last seen in October 2017. Hx of nausea and anorexia. Last weight 189 in October.  She is doing good. Her appetite has improved. She has gained from 189 to 191.7.  She has a BM daily.  Gastric bypass in 2012.   CKD stage 3. Followed by Dr. Al Corpus  Hx of rt renal cell cancer, s/p nephrectomy.       Component Value Date/Time   WBC 4.5 11/11/2015 1125   RBC 4.44 11/11/2015 1125   HGB 11.5 (L) 11/11/2015 1125   HCT 36.1 11/11/2015 1125   PLT 214 11/11/2015 1125   MCV 81.3 11/11/2015 1125   MCH 25.9 (L) 11/11/2015 1125   MCHC 31.9 (L) 11/11/2015 1125   RDW 16.6 (H) 11/11/2015 1125   LYMPHSABS 1,302 06/02/2015 1127   MONOABS 434 06/02/2015 1127   EOSABS 186 06/02/2015 1127   BASOSABS 62 06/02/2015 1127    '  1013/2016: EGD &Colonoscopy  Indications: Patient is 71 year old Caucasian female with multiple medical problems including long-standing type 2 diabetes mellitus who presents with over 9 month history of nausea and anorexia 40 pound weight loss and diarrhea. She has history of colonic polyp. She had sessile serrated polyp removed 5 years ago.  Impression:  EGD findings: Small gastric pouch with patent gastrojejunostomy. Short afferent limb of Roux-en-Y anastomosis. Normal jejunal mucosa for up to 30 cm. Random biopsies taken from small bowel mucosa distal to gastrojejunostomy.  Colonoscopy findings: Examination performed to cecum. 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 No evidence of recurrent polyps or endoscopic colitis. Random biopsies taken from mucosa of sigmoid colon. Small internal and external hemorrhoids.     Review of Systems Past Medical History:  Diagnosis Date    . Anemia   . Anxiety   . Cervical cancer ([place])   . Chronic bronchitis ([place])    "get it q yr"  . Chronic lower back pain   . Depression   . Febrile seizure ([place])    "as a child"  . Fibromyalgia   . GERD (gastroesophageal reflux disease)   . Gout   . History of blood transfusion    "S/P tonsillectomy"  . History of hiatal hernia   . Hx of cardiovascular stress test April 2016   false positive Myoview  . Hyperlipidemia   . Hypertension   . Migraine    "monthly" (05/26/2014)  . Neuropathy   . Osteoporosis   . Parathyroid disease ([place])    "my PHT levels run high; I can't take calcium"  . Pneumonia "several times"  . Renal cancer ([place])    "right"-s/p nephrectomy  . Renal insufficiency    "left kidney works at 40-60%" (05/26/2014)  . Sleep apnea    "wore mask for 2 months; could not take it" (05/26/2014)  . Type II diabetes mellitus ([place])   . Typhus fever    "as a child"    Past Surgical History:  Procedure Laterality Date  . ABDOMINAL HERNIA REPAIR  02/2013   "in [place]"  . ABDOMINAL HYSTERECTOMY  1977   "partial"  . APPENDECTOMY    . BACK SURGERY    . BILATERAL SALPINGOOPHORECTOMY Bilateral ~ 1993  . CARDIAC CATHETERIZATION  2003, April 2016   normal coronaries  . CATARACT EXTRACTION W/ INTRAOCULAR LENS  IMPLANT, BILATERAL  2015  . COLONOSCOPY N/A 11/25/2014  Procedure: COLONOSCOPY;  Surgeon: Rogene Houston, MD;  [facility];  Service: Endoscopy;  Laterality: N/A;  . CYSTOSCOPY W/ [place] MANIPULATION  1988  . DIAGNOSTIC LAPAROSCOPY  1975  . DILATION AND CURETTAGE OF UTERUS    . ESOPHAGOGASTRODUODENOSCOPY N/A 11/25/2014   Procedure: ESOPHAGOGASTRODUODENOSCOPY (EGD);  Surgeon: Rogene Houston, MD;  [facility];  Service: Endoscopy;  Laterality: N/A;  9:30 - moved to 11:25 - [name] to notify pt  . ESOPHAGOGASTRODUODENOSCOPY (EGD) WITH ESOPHAGEAL DILATION  1997  . EYE SURGERY    . GASTRIC BYPASS  2012  . HERNIA REPAIR     2015  . HIATAL  HERNIA REPAIR  1996  . INCONTINENCE SURGERY  1983  . KNEE ARTHROSCOPY Left 2000's  . LAPAROSCOPIC CHOLECYSTECTOMY  1990's  . LEFT HEART CATHETERIZATION WITH CORONARY ANGIOGRAM N/A 05/27/2014   Procedure: LEFT HEART CATHETERIZATION WITH CORONARY ANGIOGRAM;  Surgeon: Lorretta Harp, MD;  Location: [facility] CATH LAB;  Service: Cardiovascular;  Laterality: N/A;  . LUMBAR [place] SURGERY  2001   "herniated discs"  . [place]   "drilled into gum and put tooth implants uppers"  . NEPHRECTOMY Right 2002   "cancer"  . TMJ ARTHROPLASTY  1988  . TONSILLECTOMY  1976  . UPPER GI ENDOSCOPY      Allergies  Allergen Reactions  . Demerol [Meperidine] Shortness Of Breath    Stop breathing  . Hydrocodone-Acetaminophen Shortness Of Breath    REACTION: stop breathing  . Meperidine Hcl Shortness Of Breath    REACTION: stop breathing  . Vicodin [Hydrocodone-Acetaminophen] Shortness Of Breath    Stop breathing  . Ace Inhibitors Cough  . Codeine     REACTION: hallucination, confused  . Contrast Media [Iodinated Diagnostic Agents]     MRI dye. Shaky, nausea  . Fentanyl Itching  . Hydromorphone Hcl     REACTION: itching, rash, nausea, vomiting  . Morphine     REACTION: vomiting  . Naproxen     REACTION: stomach lesion  . Dilaudid [Hydromorphone Hcl] Itching, Nausea And Vomiting and Rash    Current Outpatient Prescriptions on File Prior to Visit  Medication Sig Dispense Refill  . acetaminophen (TYLENOL) 325 MG tablet Take 2 tablets (650 mg total) by mouth every 4 (four) hours as needed for headache or mild pain.    Marland Kitchen aspirin 81 MG tablet Take 81 mg by mouth daily.    Marland Kitchen atenolol (TENORMIN) 50 MG tablet TAKE ONE TABLET BY MOUTH ONCE DAILY (Patient taking differently: TAKE ONE TABLET BY MOUTH twice a day) 30 tablet 7  . cholecalciferol (VITAMIN D) 1000 UNITS tablet Take 5,000 Units by mouth daily.     Marland Kitchen dicyclomine (BENTYL) 10 MG capsule TAKE ONE CAPSULE BY MOUTH THREE TIMES DAILY BEFORE MEAL(S)  90 capsule 3  . diphenhydrAMINE (BENADRYL) 25 mg capsule Take 25 mg by mouth 2 (two) times daily as needed.    . docusate sodium (COLACE) 100 MG capsule Take 100 mg by mouth daily.    . DULoxetine (CYMBALTA) 60 MG capsule Take 60 mg by mouth daily.      . furosemide (LASIX) 40 MG tablet Take 0.5 tablets (20 mg total) by mouth daily. Take 40 mg in the AM and 20 mg in the PM (Patient taking differently: Take 40 mg by mouth 2 (two) times daily. Take 40 mg in the AM and 20 mg in the PM) 30 tablet   . insulin glargine (LANTUS) 100 UNIT/ML injection Inject 65  Units into the skin at bedtime.     . Multiple Vitamin (MULTIVITAMIN) capsule Take 1 capsule by mouth daily. Take 1 tab daily    . NITROSTAT 0.4 MG SL tablet Place 0.4 mg under the tongue as needed.    . rosuvastatin (CRESTOR) 20 MG tablet Take 20 mg by mouth daily.       No current facility-administered medications on file prior to visit.        Objective:   Physical Exam Blood pressure 128/68, pulse 60, temperature 98.1 F (36.7 C), weight 191 lb 11.2 oz (87 kg). Alert and oriented. Skin warm and dry. Oral mucosa is moist.   . Sclera anicteric, conjunctivae is pink. Thyroid not enlarged. No cervical lymphadenopathy. Lungs clear. Heart regular rate and rhythm.  Abdomen is soft. Bowel sounds are positive. No hepatomegaly. No abdominal masses felt. No tenderness.  No edema to lower extremities.         Assessment & Plan:  Weight loss.  Hx of gastric bypass in 2012 in [place]. She is doing good. She has gained about 6 pounds.  OV in 1 year.

## 2016-06-01 NOTE — Patient Instructions (Signed)
OV in 1 year.  

## 2016-06-07 DIAGNOSIS — E1129 Type 2 diabetes mellitus with other diabetic kidney complication: Secondary | ICD-10-CM | POA: Diagnosis not present

## 2016-06-07 DIAGNOSIS — I10 Essential (primary) hypertension: Secondary | ICD-10-CM | POA: Diagnosis not present

## 2016-06-07 DIAGNOSIS — Z79899 Other long term (current) drug therapy: Secondary | ICD-10-CM | POA: Diagnosis not present

## 2016-06-07 DIAGNOSIS — K219 Gastro-esophageal reflux disease without esophagitis: Secondary | ICD-10-CM | POA: Diagnosis not present

## 2016-06-07 DIAGNOSIS — N184 Chronic kidney disease, stage 4 (severe): Secondary | ICD-10-CM | POA: Diagnosis not present

## 2016-06-14 DIAGNOSIS — J069 Acute upper respiratory infection, unspecified: Secondary | ICD-10-CM | POA: Diagnosis not present

## 2016-06-14 DIAGNOSIS — E1122 Type 2 diabetes mellitus with diabetic chronic kidney disease: Secondary | ICD-10-CM | POA: Diagnosis not present

## 2016-06-14 DIAGNOSIS — N184 Chronic kidney disease, stage 4 (severe): Secondary | ICD-10-CM | POA: Diagnosis not present

## 2016-06-15 ENCOUNTER — Encounter: Payer: Self-pay | Admitting: Neurology

## 2016-06-15 ENCOUNTER — Ambulatory Visit (INDEPENDENT_AMBULATORY_CARE_PROVIDER_SITE_OTHER): Payer: Medicare Other | Admitting: Neurology

## 2016-06-15 VITALS — BP 126/60 | HR 68 | Resp 18 | Ht 63.0 in | Wt 191.5 lb

## 2016-06-15 DIAGNOSIS — G43709 Chronic migraine without aura, not intractable, without status migrainosus: Secondary | ICD-10-CM

## 2016-06-15 DIAGNOSIS — M542 Cervicalgia: Secondary | ICD-10-CM | POA: Diagnosis not present

## 2016-06-15 DIAGNOSIS — IMO0002 Reserved for concepts with insufficient information to code with codable children: Secondary | ICD-10-CM

## 2016-06-15 DIAGNOSIS — R200 Anesthesia of skin: Secondary | ICD-10-CM | POA: Diagnosis not present

## 2016-06-15 DIAGNOSIS — R269 Unspecified abnormalities of gait and mobility: Secondary | ICD-10-CM | POA: Diagnosis not present

## 2016-06-15 MED ORDER — NORTRIPTYLINE HCL 10 MG PO CAPS: ORAL_CAPSULE | ORAL | 5 refills | Status: DC

## 2016-06-15 NOTE — Progress Notes (Signed)
[facility]  PATIENT: Margaret Huerta DOB: 03-24-1945  REFERRING DOCTOR OR PCP:  [name] SOURCE: patient, notes from Dr. Willey Blade, labs/imaging reports, 2016 CT on PACS  _________________________________   HISTORICAL  CHIEF COMPLAINT:  Chief Complaint  Patient presents with  . Migraines    Sts. she continues to have daily h/a's.  "Some days are just worse than other days."  MRI's of brain and c/spine were done and in EPIC/fim    HISTORY OF PRESENT ILLNESS:  Margaret Huerta is a 71 year old woman with a long history of chronic migraine headaches .   She also has neck pain.  She has DM and kidney disease.    HA:   She has a migraine 29-30 out of 30 days a month lasting 4 hours or more each day.    She has a mild to moderate HA daily, usually on the left.    She has an aura followed by throbbing pain, nausea and photophobia.   Moving increases the pain.   A couple times a week, the pain is much more severe.    MRI of the brain did not show any acute findings. She did have evidence of chronic microvascular ischemic change. These MRIs were personally reviewed today.  Neck pain:   She also reports neck pain, left greater than right. The neck pain intensifies with headache pain intensifies.  MRI of the cervical spine 04/13/2016 showed that the spinal cord was normal. She has degenerative disc changes at C4-C5 that are not severe enough to lead to any significant nerve root impingement.  Medications:   She does not get benefit from Imitrex.   Excedrin migraine helps reduce the intensity (but does not eliminate the pain).  It works better than other NSAIDs.   Sometimes, when pain is intense, she gets opiates at the hospital.   For prophylaxis she has tried beta blockers with mild benefit (atenolol).   Tricyclics/SNRIs have not helped (Cymbalta).   Antielpileptics were either poorly tolerated (topamax) or ineffective (Lyrica)  HA history:  She has had migraine headaches with aura  since age 5.  Until a couple years ago, they would last one day and occur a few times a month.   Starting in early 2017, headaches became daily with a more constant left sided pain.   HA's also associated with neck pain.  In the past, before other pain issues, she had been placed on gabapentin but could not tolerate it. She was placed on Lyrica and felt better but she gained weight.  Numbness/Ulnar neuropathy:   She gets left hand numbness that comes and goes.    She had a left ulnar transposition in the past.        REVIEW OF SYSTEMS: Constitutional: No fevers, chills, sweats, or change in appetite Eyes: No visual changes, double vision, eye pain Ear, nose and throat: No hearing loss, ear pain, nasal congestion, sore throat Cardiovascular: No chest pain, palpitations Respiratory: No shortness of breath at rest or with exertion.   No wheezes GastrointestinaI: No nausea, vomiting, diarrhea, abdominal pain, fecal incontinence Genitourinary: No dysuria, urinary retention or frequency.  No nocturia. Musculoskeletal: No neck pain, back pain Integumentary: No rash, pruritus, skin lesions Neurological: as above Psychiatric: No depression at this time.  No anxiety Endocrine: No palpitations, diaphoresis, change in appetite, change in weigh or increased thirst Hematologic/Lymphatic: No anemia, purpura, petechiae. Allergic/Immunologic: No itchy/runny eyes, nasal congestion, recent allergic reactions, rashes  ALLERGIES: Allergies  Allergen Reactions  .  Demerol [Meperidine] Shortness Of Breath    Stop breathing  . Hydrocodone-Acetaminophen Shortness Of Breath    REACTION: stop breathing  . Meperidine Hcl Shortness Of Breath    REACTION: stop breathing  . Vicodin [Hydrocodone-Acetaminophen] Shortness Of Breath    Stop breathing  . Ace Inhibitors Cough  . Codeine     REACTION: hallucination, confused  . Contrast Media [Iodinated Diagnostic Agents]     MRI dye. Shaky, nausea  . Fentanyl  Itching  . Hydromorphone Hcl     REACTION: itching, rash, nausea, vomiting  . Morphine     REACTION: vomiting  . Naproxen     REACTION: stomach lesion  . Dilaudid [Hydromorphone Hcl] Itching, Nausea And Vomiting and Rash    HOME MEDICATIONS:  Current Outpatient Prescriptions:  .  acetaminophen (TYLENOL) 325 MG tablet, Take 2 tablets (650 mg total) by mouth every 4 (four) hours as needed for headache or mild pain., Disp: , Rfl:  .  aspirin 81 MG tablet, Take 81 mg by mouth daily., Disp: , Rfl:  .  atenolol (TENORMIN) 50 MG tablet, TAKE ONE TABLET BY MOUTH ONCE DAILY (Patient taking differently: TAKE ONE TABLET BY MOUTH twice a day), Disp: 30 tablet, Rfl: 7 .  cholecalciferol (VITAMIN D) 1000 UNITS tablet, Take 5,000 Units by mouth daily. , Disp: , Rfl:  .  dicyclomine (BENTYL) 10 MG capsule, TAKE ONE CAPSULE BY MOUTH THREE TIMES DAILY BEFORE MEAL(S), Disp: 90 capsule, Rfl: 3 .  diphenhydrAMINE (BENADRYL) 25 mg capsule, Take 25 mg by mouth 2 (two) times daily as needed., Disp: , Rfl:  .  docusate sodium (COLACE) 100 MG capsule, Take 100 mg by mouth daily., Disp: , Rfl:  .  DULoxetine (CYMBALTA) 60 MG capsule, Take 60 mg by mouth daily.  , Disp: , Rfl:  .  furosemide (LASIX) 40 MG tablet, Take 0.5 tablets (20 mg total) by mouth daily. Take 40 mg in the AM and 20 mg in the PM (Patient taking differently: Take 40 mg by mouth 2 (two) times daily. Take 40 mg in the AM and 20 mg in the PM), Disp: 30 tablet, Rfl:  .  insulin glargine (LANTUS) 100 UNIT/ML injection, Inject 65 Units into the skin at bedtime. , Disp: , Rfl:  .  Multiple Vitamin (MULTIVITAMIN) capsule, Take 1 capsule by mouth daily. Take 1 tab daily, Disp: , Rfl:  .  multivitamin (RENA-VIT) TABS tablet, Take 1 tablet by mouth daily., Disp: , Rfl:  .  NITROSTAT 0.4 MG SL tablet, Place 0.4 mg under the tongue as needed., Disp: , Rfl:  .  rosuvastatin (CRESTOR) 20 MG tablet, Take 20 mg by mouth daily.  , Disp: , Rfl:  .  nortriptyline  (PAMELOR) 10 MG capsule, Take one or two pills at night, Disp: 60 capsule, Rfl: 5  PAST MEDICAL HISTORY: Past Medical History:  Diagnosis Date  . Anemia   . Anxiety   . Cervical cancer ([place])   . Chronic bronchitis ([place])    "get it q yr"  . Chronic lower back pain   . Depression   . Febrile seizure ([place])    "as a child"  . Fibromyalgia   . GERD (gastroesophageal reflux disease)   . Gout   . History of blood transfusion    "S/P tonsillectomy"  . History of hiatal hernia   . Hx of cardiovascular stress test April 2016   false positive Myoview  . Hyperlipidemia   . Hypertension   . Migraine    "  monthly" (05/26/2014)  . Neuropathy   . Osteoporosis   . Parathyroid disease ([place])    "my PHT levels run high; I can't take calcium"  . Pneumonia "several times"  . Renal cancer ([place])    "right"-s/p nephrectomy  . Renal insufficiency    "left kidney works at 40-60%" (05/26/2014)  . Sleep apnea    "wore mask for 2 months; could not take it" (05/26/2014)  . Type II diabetes mellitus ([place])   . Typhus fever    "as a child"    PAST SURGICAL HISTORY: Past Surgical History:  Procedure Laterality Date  . ABDOMINAL HERNIA REPAIR  02/2013   "in [place]"  . ABDOMINAL HYSTERECTOMY  1977   "partial"  . APPENDECTOMY    . BACK SURGERY    . BILATERAL SALPINGOOPHORECTOMY Bilateral ~ 1993  . CARDIAC CATHETERIZATION  2003, April 2016   normal coronaries  . CATARACT EXTRACTION W/ INTRAOCULAR LENS  IMPLANT, BILATERAL  2015  . COLONOSCOPY N/A 11/25/2014   Procedure: COLONOSCOPY;  Surgeon: Rogene Houston, MD;  Location: [facility];  Service: Endoscopy;  Laterality: N/A;  . CYSTOSCOPY W/ [place] MANIPULATION  1988  . DIAGNOSTIC LAPAROSCOPY  1975  . DILATION AND CURETTAGE OF UTERUS    . ESOPHAGOGASTRODUODENOSCOPY N/A 11/25/2014   Procedure: ESOPHAGOGASTRODUODENOSCOPY (EGD);  Surgeon: Rogene Houston, MD;  Location: [facility];  Service: Endoscopy;  Laterality: N/A;  9:30 - moved to 11:25 -  [name] to notify pt  . ESOPHAGOGASTRODUODENOSCOPY (EGD) WITH ESOPHAGEAL DILATION  1997  . EYE SURGERY    . GASTRIC BYPASS  2012  . HERNIA REPAIR     2015  . HIATAL HERNIA REPAIR  1996  . INCONTINENCE SURGERY  1983  . KNEE ARTHROSCOPY Left 2000's  . LAPAROSCOPIC CHOLECYSTECTOMY  1990's  . LEFT HEART CATHETERIZATION WITH CORONARY ANGIOGRAM N/A 05/27/2014   Procedure: LEFT HEART CATHETERIZATION WITH CORONARY ANGIOGRAM;  Surgeon: Lorretta Harp, MD;  Location: [facility] CATH LAB;  Service: Cardiovascular;  Laterality: N/A;  . LUMBAR [place] SURGERY  2001   "herniated discs"  . [place]   "drilled into gum and put tooth implants uppers"  . NEPHRECTOMY Right 2002   "cancer"  . TMJ ARTHROPLASTY  1988  . TONSILLECTOMY  1976  . UPPER GI ENDOSCOPY      FAMILY HISTORY: Family History  Problem Relation Age of Onset  . Emphysema Father   . Heart disease Father   . Clotting disorder Mother   . Diabetes Mother   . Kidney disease Mother   . Allergies      whole family per pt  . Lung cancer Maternal Uncle   . Alcohol abuse Maternal Uncle     SOCIAL HISTORY:  Social History   Social History  . Marital status: Married    Spouse name: N/A  . Number of children: N/A  . Years of education: N/A   Occupational History  . Housewife    Social History Main Topics  . Smoking status: Never Smoker  . Smokeless tobacco: Never Used  . Alcohol use No  . Drug use: No  . Sexual activity: Yes    Birth control/ protection: Surgical   Other Topics Concern  . Not on file   Social History Narrative  . No narrative on file     PHYSICAL EXAM  Vitals:   06/15/16 0933  BP: 126/60  Pulse: 68  Resp: 18  Weight: 191 lb 8 oz (86.9 kg)  Height: 5'  3" (1.6 m)    Body mass index is 33.92 kg/m.   General: The patient is well-developed and well-nourished and in no acute distress  Eyes:  Funduscopic exam shows normal optic discs and retinal vessels.  Neck: Neck is tender over the left  much more than right occiput. There is also milder tenderness over the left greater than right mid cervical paraspinal muscles and the trapezius muscle.Marland Kitchen   Neurologic Exam  Mental status: The patient is alert and oriented x 3 at the time of the examination. The patient has apparent normal recent and remote memory, with an apparently normal attention span and concentration ability.   Speech is normal.  Cranial nerves: Extraocular movements are full. Pupils are equal, round, and reactive to light and accomodation.  Visual fields are full.  She has normal facial strength and sensation. Trapezius and sternocleidomastoid strength is normal. No dysarthria is noted.  The tongue is midline, and the patient has symmetric elevation of the soft palate. No obvious hearing deficits are noted.  Motor:  Muscle bulk is normal.   Tone is normal. Strength is  5 / 5 in all 4 extremities except 4+/5 EHL in both feet.    Sensory:  She has reduced touch sensation in the left thenar eminence and more symmetric sensation in the hyperthenar eminences. She did have a mild Tinel sign at the left wrist. Vibration sensation was reduced at the toes.   Coordination: Cerebellar testing reveals good finger-nose-finger and heel-to-shin bilaterally.  Gait and station: Station is normal.   Gait is normal. Tandem gait is wide. Romberg is borderline.   Reflexes: Deep tendon reflexes are symmetric and normal bilaterally.       DIAGNOSTIC DATA (LABS, IMAGING, TESTING) - I reviewed patient records, labs, notes, testing and imaging myself where available.  Lab Results  Component Value Date   WBC 4.5 11/11/2015   HGB 11.6 (L) 02/17/2016   HCT 36.9 02/17/2016   MCV 81.3 11/11/2015   PLT 214 11/11/2015      Component Value Date/Time   NA 138 07/05/2014 1110   K 3.7 07/05/2014 1110   CL 104 07/05/2014 1110   CO2 25 07/05/2014 1110   GLUCOSE 112 (H) 07/05/2014 1110   BUN 42 (H) 07/05/2014 1110   CREATININE 2.40 (H)  07/05/2014 1110   CALCIUM 9.8 07/05/2014 1110   GFRNONAA 16 (L) 06/22/2014 1209   GFRAA 19 (L) 06/22/2014 1209   No results found for: CHOL, HDL, LDLCALC, LDLDIRECT, TRIG, CHOLHDL No results found for: HGBA1C Lab Results  Component Value Date   VITAMINB12 1,134 (H) 11/25/2014   Lab Results  Component Value Date   TSH 1.80 05/12/2014       ASSESSMENT AND PLAN  Chronic migraine  Neck pain  Gait disturbance  Numbness   1.   Add nortriptyline 10 to 20 mg nightly.  Consider an anti-CGRP when available if not effective (she does not want Botox injecitions)   she would prefer not to do a trigger point injection as steroids can raise her sugars. 2.   Continue atenolol 3.   Excedrin Migraine, (try not to take more than 3 times a week) 4.   If hand numbness worsens, consider a nerve conduction study to assess the severity of the probable left carpal tunnel syndrome.  rtc 6 months, sooner if problems  Rainy Rothman A. Felecia Shelling, MD, PhD 08/19/6765, 20:94 AM Certified in Neurology, Clinical Neurophysiology, Sleep Medicine, Pain Medicine and Neuroimaging  [facility] [id number]  [address] [place], [place] [address] [phone]

## 2016-07-02 ENCOUNTER — Other Ambulatory Visit (INDEPENDENT_AMBULATORY_CARE_PROVIDER_SITE_OTHER): Payer: Self-pay | Admitting: Internal Medicine

## 2016-07-02 DIAGNOSIS — R197 Diarrhea, unspecified: Secondary | ICD-10-CM

## 2016-07-31 DIAGNOSIS — Z961 Presence of intraocular lens: Secondary | ICD-10-CM | POA: Diagnosis not present

## 2016-07-31 DIAGNOSIS — H0289 Other specified disorders of eyelid: Secondary | ICD-10-CM | POA: Diagnosis not present

## 2016-07-31 DIAGNOSIS — H10413 Chronic giant papillary conjunctivitis, bilateral: Secondary | ICD-10-CM | POA: Diagnosis not present

## 2016-08-09 ENCOUNTER — Telehealth: Payer: Self-pay | Admitting: Neurology

## 2016-08-09 NOTE — Telephone Encounter (Signed)
Pt said she was asked to f/u with Dr Felecia Shelling re: how she was doing on the nortriptyline (PAMELOR) 10 MG capsule, pt said she has not had many head aches and the pain in her shoulder and leg has gone away also.

## 2016-08-09 NOTE — Telephone Encounter (Signed)
I have spoken with Margaret Huerta this afternoon and advised that RAS will be happy to hear that her h/a's are better with Nortriptyline.  He is out of the office until Monday--I will give him the update then/fim

## 2016-08-24 DIAGNOSIS — M545 Low back pain: Secondary | ICD-10-CM | POA: Diagnosis not present

## 2016-08-24 DIAGNOSIS — M5431 Sciatica, right side: Secondary | ICD-10-CM | POA: Diagnosis not present

## 2016-08-24 DIAGNOSIS — M5136 Other intervertebral disc degeneration, lumbar region: Secondary | ICD-10-CM | POA: Diagnosis not present

## 2016-08-24 DIAGNOSIS — M546 Pain in thoracic spine: Secondary | ICD-10-CM | POA: Diagnosis not present

## 2016-09-13 DIAGNOSIS — N184 Chronic kidney disease, stage 4 (severe): Secondary | ICD-10-CM | POA: Diagnosis not present

## 2016-09-13 DIAGNOSIS — E785 Hyperlipidemia, unspecified: Secondary | ICD-10-CM | POA: Diagnosis not present

## 2016-09-13 DIAGNOSIS — E1142 Type 2 diabetes mellitus with diabetic polyneuropathy: Secondary | ICD-10-CM | POA: Diagnosis not present

## 2016-09-13 DIAGNOSIS — Z79899 Other long term (current) drug therapy: Secondary | ICD-10-CM | POA: Diagnosis not present

## 2016-09-13 DIAGNOSIS — E1129 Type 2 diabetes mellitus with other diabetic kidney complication: Secondary | ICD-10-CM | POA: Diagnosis not present

## 2016-09-13 DIAGNOSIS — K219 Gastro-esophageal reflux disease without esophagitis: Secondary | ICD-10-CM | POA: Diagnosis not present

## 2016-09-13 DIAGNOSIS — M109 Gout, unspecified: Secondary | ICD-10-CM | POA: Diagnosis not present

## 2016-09-20 DIAGNOSIS — G43909 Migraine, unspecified, not intractable, without status migrainosus: Secondary | ICD-10-CM | POA: Diagnosis not present

## 2016-09-20 DIAGNOSIS — E1129 Type 2 diabetes mellitus with other diabetic kidney complication: Secondary | ICD-10-CM | POA: Diagnosis not present

## 2016-09-20 DIAGNOSIS — N184 Chronic kidney disease, stage 4 (severe): Secondary | ICD-10-CM | POA: Diagnosis not present

## 2016-10-24 DIAGNOSIS — I129 Hypertensive chronic kidney disease with stage 1 through stage 4 chronic kidney disease, or unspecified chronic kidney disease: Secondary | ICD-10-CM | POA: Diagnosis not present

## 2016-10-24 DIAGNOSIS — N189 Chronic kidney disease, unspecified: Secondary | ICD-10-CM | POA: Diagnosis not present

## 2016-10-24 DIAGNOSIS — E559 Vitamin D deficiency, unspecified: Secondary | ICD-10-CM | POA: Diagnosis not present

## 2016-10-24 DIAGNOSIS — Z905 Acquired absence of kidney: Secondary | ICD-10-CM | POA: Diagnosis not present

## 2016-11-08 ENCOUNTER — Other Ambulatory Visit (INDEPENDENT_AMBULATORY_CARE_PROVIDER_SITE_OTHER): Payer: Self-pay | Admitting: Internal Medicine

## 2016-11-08 DIAGNOSIS — R197 Diarrhea, unspecified: Secondary | ICD-10-CM

## 2016-11-19 DIAGNOSIS — M7981 Nontraumatic hematoma of soft tissue: Secondary | ICD-10-CM | POA: Diagnosis not present

## 2016-12-10 DIAGNOSIS — Z23 Encounter for immunization: Secondary | ICD-10-CM | POA: Diagnosis not present

## 2016-12-18 ENCOUNTER — Telehealth: Payer: Self-pay | Admitting: *Deleted

## 2016-12-18 ENCOUNTER — Encounter: Payer: Self-pay | Admitting: Neurology

## 2016-12-18 ENCOUNTER — Ambulatory Visit (INDEPENDENT_AMBULATORY_CARE_PROVIDER_SITE_OTHER): Payer: Medicare Other | Admitting: Neurology

## 2016-12-18 VITALS — BP 153/87 | HR 73 | Resp 18 | Ht 63.0 in | Wt 190.0 lb

## 2016-12-18 DIAGNOSIS — E1142 Type 2 diabetes mellitus with diabetic polyneuropathy: Secondary | ICD-10-CM

## 2016-12-18 DIAGNOSIS — IMO0002 Reserved for concepts with insufficient information to code with codable children: Secondary | ICD-10-CM

## 2016-12-18 DIAGNOSIS — G43709 Chronic migraine without aura, not intractable, without status migrainosus: Secondary | ICD-10-CM | POA: Diagnosis not present

## 2016-12-18 DIAGNOSIS — M542 Cervicalgia: Secondary | ICD-10-CM | POA: Diagnosis not present

## 2016-12-18 DIAGNOSIS — R200 Anesthesia of skin: Secondary | ICD-10-CM | POA: Diagnosis not present

## 2016-12-18 MED ORDER — FREMANEZUMAB-VFRM 225 MG/1.5ML ~~LOC~~ SOSY: 1.0000 | PREFILLED_SYRINGE | SUBCUTANEOUS | 4 refills | Status: DC

## 2016-12-18 NOTE — Progress Notes (Signed)
[facility]  PATIENT: Margaret Huerta DOB: 07-Jan-1946  REFERRING DOCTOR OR PCP:  [name] SOURCE: patient, notes from Dr. Willey Blade, labs/imaging reports, 2016 CT on PACS  _________________________________   HISTORICAL  CHIEF COMPLAINT:  Chief Complaint  Patient presents with  . Migraines    Sts. h/a's are still daily, but less severe.  She couldn't tolerate Nortriptyline--sts. it caused blurry vision. Still taking Atenolol.  Takes Tylenol 1,000mg  daily./fim  . Left Hand Numbness    HISTORY OF PRESENT ILLNESS:  Margaret Huerta is a 71 year old woman with a long history of chronic migraine headaches .   She also has neck pain.  She has DM and kidney disease.    Update 12/18/2016:   She felt her eyesight got blurry on the nortriptyline.    Her headache pain is located in the left forehead and the occiput on the left. The pain occurs 30/30 days a month for more than 4 hours a day. When the pain intensifies, she would then get migrainous features including nausea, photophobia and phonophobia. Moving will increase the pain then. She tries to take Tylenol as soon as the pain starts to intensify and that will sometimes help. She does have some neck pain at the top of the cervical spine on the neck. This also worsened when the headache worsens. At her last visit, she started nortriptyline. The headache frequency and intensity did improve some but eyesight became blurry so she needed to stop. He has not tried any of the anti-CGRP compound yet.  She reports left hand numbness, worse in the 4th and 5th fingers but also in the 2nd and third.   She has had ulnar nerve transposition in the past (2016)   From 06/15/2016: HA:   She has a migraine 29-30 out of 30 days a month lasting 4 hours or more each day.    She has a mild to moderate HA daily, usually on the left.    She has an aura followed by throbbing pain, nausea and photophobia.   Moving increases the pain.   A couple times a week,  the pain is much more severe.    MRI of the brain did not show any acute findings. She did have evidence of chronic microvascular ischemic change. These MRIs were personally reviewed today.  Neck pain:   She also reports neck pain, left greater than right. The neck pain intensifies with headache pain intensifies.  MRI of the cervical spine 04/13/2016 showed that the spinal cord was normal. She has degenerative disc changes at C4-C5 that are not severe enough to lead to any significant nerve root impingement.  Medications:   She does not get benefit from Imitrex.   Excedrin migraine helps reduce the intensity (but does not eliminate the pain).  It works better than other NSAIDs.   Sometimes, when pain is intense, she gets opiates at the hospital.   For prophylaxis she has tried beta blockers with mild benefit (atenolol).   Tricyclics/SNRIs have not helped (Cymbalta).   Antielpileptics were either poorly tolerated (topamax) or ineffective (Lyrica)  HA history:  She has had migraine headaches with aura since age 71.  Until a couple years ago, they would last one day and occur a few times a month.   Starting in early 2017, headaches became daily with a more constant left sided pain.   HA's also associated with neck pain.  In the past, before other pain issues, she had been placed on gabapentin but  could not tolerate it. She was placed on Lyrica and felt better but she gained weight.  Numbness/Ulnar neuropathy:   She gets left hand numbness that comes and goes.    She had a left ulnar transposition in the past.        REVIEW OF SYSTEMS: Constitutional: No fevers, chills, sweats, or change in appetite Eyes: No visual changes, double vision, eye pain Ear, nose and throat: No hearing loss, ear pain, nasal congestion, sore throat Cardiovascular: No chest pain, palpitations Respiratory: No shortness of breath at rest or with exertion.   No wheezes GastrointestinaI: No nausea, vomiting, diarrhea, abdominal  pain, fecal incontinence Genitourinary: No dysuria, urinary retention or frequency.  No nocturia. Musculoskeletal: No neck pain, back pain Integumentary: No rash, pruritus, skin lesions Neurological: as above Psychiatric: No depression at this time.  No anxiety Endocrine: No palpitations, diaphoresis, change in appetite, change in weigh or increased thirst Hematologic/Lymphatic: No anemia, purpura, petechiae. Allergic/Immunologic: No itchy/runny eyes, nasal congestion, recent allergic reactions, rashes  ALLERGIES: Allergies  Allergen Reactions  . Demerol [Meperidine] Shortness Of Breath    Stop breathing  . Hydrocodone-Acetaminophen Shortness Of Breath    REACTION: stop breathing  . Meperidine Hcl Shortness Of Breath    REACTION: stop breathing  . Vicodin [Hydrocodone-Acetaminophen] Shortness Of Breath    Stop breathing  . Ace Inhibitors Cough  . Codeine     REACTION: hallucination, confused  . Contrast Media [Iodinated Diagnostic Agents]     MRI dye. Shaky, nausea  . Fentanyl Itching  . Hydromorphone Hcl     REACTION: itching, rash, nausea, vomiting  . Morphine     REACTION: vomiting  . Naproxen     REACTION: stomach lesion  . Dilaudid [Hydromorphone Hcl] Itching, Nausea And Vomiting and Rash    HOME MEDICATIONS:  Current Outpatient Medications:  .  acetaminophen (TYLENOL) 325 MG tablet, Take 2 tablets (650 mg total) by mouth every 4 (four) hours as needed for headache or mild pain., Disp: , Rfl:  .  atenolol (TENORMIN) 50 MG tablet, TAKE ONE TABLET BY MOUTH ONCE DAILY (Patient taking differently: TAKE ONE TABLET BY MOUTH twice a day), Disp: 30 tablet, Rfl: 7 .  cholecalciferol (VITAMIN D) 1000 UNITS tablet, Take 5,000 Units by mouth daily. , Disp: , Rfl:  .  diphenhydrAMINE (BENADRYL) 25 mg capsule, Take 25 mg by mouth 2 (two) times daily as needed., Disp: , Rfl:  .  DULoxetine (CYMBALTA) 60 MG capsule, Take 60 mg by mouth daily.  , Disp: , Rfl:  .  furosemide (LASIX)  40 MG tablet, Take 0.5 tablets (20 mg total) by mouth daily. Take 40 mg in the AM and 20 mg in the PM (Patient taking differently: Take 40 mg by mouth 2 (two) times daily. Take 40 mg in the AM and 20 mg in the PM), Disp: 30 tablet, Rfl:  .  insulin glargine (LANTUS) 100 UNIT/ML injection, Inject 65 Units into the skin at bedtime. , Disp: , Rfl:  .  Multiple Vitamin (MULTIVITAMIN) capsule, Take 1 capsule by mouth daily. Take 1 tab daily, Disp: , Rfl:  .  multivitamin (RENA-VIT) TABS tablet, Take 1 tablet by mouth daily., Disp: , Rfl:  .  NITROSTAT 0.4 MG SL tablet, Place 0.4 mg under the tongue as needed., Disp: , Rfl:  .  rosuvastatin (CRESTOR) 20 MG tablet, Take 20 mg by mouth daily.  , Disp: , Rfl:   PAST MEDICAL HISTORY: Past Medical History:  Diagnosis Date  .  Anemia   . Anxiety   . Cervical cancer ([place])   . Chronic bronchitis ([place])    "get it q yr"  . Chronic lower back pain   . Depression   . Febrile seizure ([place])    "as a child"  . Fibromyalgia   . GERD (gastroesophageal reflux disease)   . Gout   . History of blood transfusion    "S/P tonsillectomy"  . History of hiatal hernia   . Hx of cardiovascular stress test April 2016   false positive Myoview  . Hyperlipidemia   . Hypertension   . Migraine    "monthly" (05/26/2014)  . Neuropathy   . Osteoporosis   . 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 Pneumonia "several times"  . Renal cancer ([place])    "right"-s/p nephrectomy  . Renal insufficiency    "left kidney works at 40-60%" (05/26/2014)  . Sleep apnea    "wore mask for 2 months; could not take it" (05/26/2014)  . Type II diabetes mellitus ([place])   . Typhus fever    "as a child"    PAST SURGICAL HISTORY: Past Surgical History:  Procedure Laterality Date  . ABDOMINAL HERNIA REPAIR  02/2013   "in [place]"  . ABDOMINAL HYSTERECTOMY  1977   "partial"  . APPENDECTOMY    . BACK SURGERY    . BILATERAL SALPINGOOPHORECTOMY  Bilateral ~ 1993  . CARDIAC CATHETERIZATION  2003, April 2016   normal coronaries  . CATARACT EXTRACTION W/ INTRAOCULAR LENS  IMPLANT, BILATERAL  2015  . CYSTOSCOPY W/ [place] MANIPULATION  1988  . DIAGNOSTIC LAPAROSCOPY  1975  . DILATION AND CURETTAGE OF UTERUS    . ESOPHAGOGASTRODUODENOSCOPY (EGD) WITH ESOPHAGEAL DILATION  1997  . EYE SURGERY    . GASTRIC BYPASS  2012  . HERNIA REPAIR     2015  . HIATAL HERNIA REPAIR  1996  . INCONTINENCE SURGERY  1983  . KNEE ARTHROSCOPY Left 2000's  . LAPAROSCOPIC CHOLECYSTECTOMY  1990's  . [place] SURGERY  2001   "herniated discs"  . [place]   "drilled into gum and put tooth implants uppers"  . NEPHRECTOMY Right 2002   "cancer"  . TMJ ARTHROPLASTY  1988  . TONSILLECTOMY  1976  . UPPER GI ENDOSCOPY      FAMILY HISTORY: Family History  Problem Relation Age of Onset  . Emphysema Father   . Heart disease Father   . Clotting disorder Mother   . Diabetes Mother   . Kidney disease Mother   . Allergies Unknown        whole family per pt  . Lung cancer Maternal Uncle   . Alcohol abuse Maternal Uncle     SOCIAL HISTORY:  Social History   Socioeconomic History  . Marital status: Married    Spouse name: Not on file  . Number of children: Not on file  . Years of education: Not on file  . Highest education level: Not on file  Social Needs  . Financial resource strain: Not on file  . Food insecurity - worry: Not on file  . Food insecurity - inability: Not on file  . Transportation needs - medical: Not on file  . Transportation needs - non-medical: Not on file  Occupational History  . Occupation: Housewife  Tobacco Use  . Smoking status: Never Smoker  . Smokeless tobacco: Never Used  Substance and Sexual Activity  . Alcohol  use: No    Alcohol/week: 0.0 oz  . Drug use: No  . Sexual activity: Yes    Birth control/protection: Surgical  Other Topics Concern  . Not on file  Social History Narrative  . Not on file       PHYSICAL EXAM  Vitals:   12/18/16 0823  BP: (!) 153/87  Pulse: 73  Resp: 18  Weight: 190 lb (86.2 kg)  Height: 5\' 3"  (1.6 m)    Body mass index is 33.66 kg/m.   General: The patient is well-developed and well-nourished and in no acute distress   Neck: There is tenderness at the left occiput much greater than the right. There is also mild to moderate left upper paraspinal muscle tenderness. The range of motion of the neck is slightly reduced.   Neurologic Exam  Mental status: The patient is alert and oriented x 3 at the time of the examination. The patient has apparent normal recent and remote memory, with an apparently normal attention span and concentration ability.   Speech is normal.  Cranial nerves: Extraocular movements are full. Pupils are equal, round, and reactive to light and accomodation.  Visual fields are full.  She has normal facial strength and sensation. Trapezius and sternocleidomastoid strength is normal. No dysarthria is noted.  The tongue is midline, and the patient has symmetric elevation of the soft palate. No obvious hearing deficits are noted.  Motor:  Muscle bulk is normal.   Tone is normal. Strength is  5 / 5 in all 4 extremities except 4+/5 EHL in both feet.    Sensory:  The left palm has reduced sensation compared to the right. She did have a mild Tinel sign at the left wrist and elbow. Vibration sensation was reduced at the toes but normal at the ankles  Coordination: Cerebellar testing reveals good finger-nose-finger and heel-to-shin bilaterally.  Gait and station: Station is normal.   Gait is normal. Tandem gait is wide. Romberg is borderline.   Reflexes: Deep tendon reflexes are symmetric and normal bilaterally.       DIAGNOSTIC DATA (LABS, IMAGING, TESTING) - I reviewed patient records, labs, notes, testing and imaging myself where available.  Lab Results  Component Value Date   WBC 4.5 11/11/2015   HGB 11.6 (L) 02/17/2016   HCT 36.9  02/17/2016   MCV 81.3 11/11/2015   PLT 214 11/11/2015      Component Value Date/Time   NA 138 07/05/2014 1110   K 3.7 07/05/2014 1110   CL 104 07/05/2014 1110   CO2 25 07/05/2014 1110   GLUCOSE 112 (H) 07/05/2014 1110   BUN 42 (H) 07/05/2014 1110   CREATININE 2.40 (H) 07/05/2014 1110   CALCIUM 9.8 07/05/2014 1110   GFRNONAA 16 (L) 06/22/2014 1209   GFRAA 19 (L) 06/22/2014 1209   No results found for: CHOL, HDL, LDLCALC, LDLDIRECT, TRIG, CHOLHDL No results found for: HGBA1C Lab Results  Component Value Date   VITAMINB12 1,134 (H) 11/25/2014   Lab Results  Component Value Date   TSH 1.80 05/12/2014       ASSESSMENT AND PLAN  Chronic migraine  Neck pain  Diabetic polyneuropathy associated with type 2 diabetes mellitus (HCC)  Numbness  Numbness of left hand   1.   Injection of the left splenius capitis muscle with 40 mg Depo-Medrol in 3 mL Marcaine. He tolerated the procedure well and there were no complications. 2.   Ajovy 1 syringe monthly for her chronic migraine.   Continue atenolol  3.   Excedrin Migraine prn, (try not to take more than 3 times a week) 4.   If hand numbness worsens, consider a nerve conduction study to assess the etiology of numbness.  5.   Her stable mild diabetic polyneuropathy does not require treatment at this time. rtc 6 months, sooner if problems  Macky Galik A. Felecia Shelling, MD, PhD 71/02/6577, 0:38 AM Certified in Neurology, Clinical Neurophysiology, Sleep Medicine, Pain Medicine and Neuroimaging  [facility] [address] [place], [place] [address] [phone]

## 2016-12-18 NOTE — Telephone Encounter (Signed)
PA for Ajovy 225mg /1.35ml SQ inj. #1/30 completed by phone with [facility], phone # [phone].  Dx: G43.909 (chronic migraine).  Tried and failed meds: Imitrex, Excedrin Migraine, Atenolol, Cymbalta--all ineffective.  Gabapentin, Lyrica, Topamax--all not tolerated.  Fentanyl, Naproxen, Dilaudid, Demerol, Vicodin, Ace inhibitors, codeine--allergic to all./fim

## 2016-12-19 DIAGNOSIS — C649 Malignant neoplasm of unspecified kidney, except renal pelvis: Secondary | ICD-10-CM | POA: Diagnosis not present

## 2016-12-19 DIAGNOSIS — C539 Malignant neoplasm of cervix uteri, unspecified: Secondary | ICD-10-CM | POA: Diagnosis not present

## 2016-12-19 DIAGNOSIS — N184 Chronic kidney disease, stage 4 (severe): Secondary | ICD-10-CM | POA: Diagnosis not present

## 2016-12-19 DIAGNOSIS — E785 Hyperlipidemia, unspecified: Secondary | ICD-10-CM | POA: Diagnosis not present

## 2016-12-19 DIAGNOSIS — E1129 Type 2 diabetes mellitus with other diabetic kidney complication: Secondary | ICD-10-CM | POA: Diagnosis not present

## 2016-12-19 DIAGNOSIS — Z79899 Other long term (current) drug therapy: Secondary | ICD-10-CM | POA: Diagnosis not present

## 2016-12-19 DIAGNOSIS — M109 Gout, unspecified: Secondary | ICD-10-CM | POA: Diagnosis not present

## 2016-12-19 DIAGNOSIS — I10 Essential (primary) hypertension: Secondary | ICD-10-CM | POA: Diagnosis not present

## 2016-12-19 DIAGNOSIS — K219 Gastro-esophageal reflux disease without esophagitis: Secondary | ICD-10-CM | POA: Diagnosis not present

## 2016-12-28 DIAGNOSIS — H10413 Chronic giant papillary conjunctivitis, bilateral: Secondary | ICD-10-CM | POA: Diagnosis not present

## 2016-12-28 DIAGNOSIS — E119 Type 2 diabetes mellitus without complications: Secondary | ICD-10-CM | POA: Diagnosis not present

## 2016-12-28 DIAGNOSIS — Z961 Presence of intraocular lens: Secondary | ICD-10-CM | POA: Diagnosis not present

## 2016-12-28 DIAGNOSIS — H26492 Other secondary cataract, left eye: Secondary | ICD-10-CM | POA: Diagnosis not present

## 2017-01-08 DIAGNOSIS — E1122 Type 2 diabetes mellitus with diabetic chronic kidney disease: Secondary | ICD-10-CM | POA: Diagnosis not present

## 2017-01-08 DIAGNOSIS — E785 Hyperlipidemia, unspecified: Secondary | ICD-10-CM | POA: Diagnosis not present

## 2017-01-08 DIAGNOSIS — N184 Chronic kidney disease, stage 4 (severe): Secondary | ICD-10-CM | POA: Diagnosis not present

## 2017-01-15 ENCOUNTER — Encounter: Payer: Self-pay | Admitting: *Deleted

## 2017-01-15 NOTE — Telephone Encounter (Signed)
Appeal letter for [facility] faxed to [facility] ([facility]), fax# [phone]

## 2017-01-17 NOTE — Telephone Encounter (Signed)
Fax received from [employer].(phone# [phone]  Ajovy appeal approved for dates 01/16/17 thru 01/16/18.  Ref# [id number]

## 2017-01-17 NOTE — Telephone Encounter (Signed)
Spoke with [employer] and explained that ins. has approved Ajovy/fim

## 2017-03-22 DIAGNOSIS — Z79899 Other long term (current) drug therapy: Secondary | ICD-10-CM | POA: Diagnosis not present

## 2017-03-22 DIAGNOSIS — E1129 Type 2 diabetes mellitus with other diabetic kidney complication: Secondary | ICD-10-CM | POA: Diagnosis not present

## 2017-03-22 DIAGNOSIS — E785 Hyperlipidemia, unspecified: Secondary | ICD-10-CM | POA: Diagnosis not present

## 2017-03-22 DIAGNOSIS — N184 Chronic kidney disease, stage 4 (severe): Secondary | ICD-10-CM | POA: Diagnosis not present

## 2017-03-28 DIAGNOSIS — F411 Generalized anxiety disorder: Secondary | ICD-10-CM | POA: Diagnosis not present

## 2017-03-28 DIAGNOSIS — E1129 Type 2 diabetes mellitus with other diabetic kidney complication: Secondary | ICD-10-CM | POA: Diagnosis not present

## 2017-03-28 DIAGNOSIS — N184 Chronic kidney disease, stage 4 (severe): Secondary | ICD-10-CM | POA: Diagnosis not present

## 2017-03-28 DIAGNOSIS — Z6835 Body mass index (BMI) 35.0-35.9, adult: Secondary | ICD-10-CM | POA: Diagnosis not present

## 2017-05-30 ENCOUNTER — Emergency Department (HOSPITAL_COMMUNITY)
Admission: EM | Admit: 2017-05-30 | Discharge: 2017-05-30 | Disposition: A | Discharge status: Home / Self Care | Payer: Medicare Other | Attending: Emergency Medicine | Admitting: Emergency Medicine

## 2017-05-30 ENCOUNTER — Encounter (HOSPITAL_COMMUNITY): Payer: Self-pay

## 2017-05-30 ENCOUNTER — Other Ambulatory Visit: Payer: Self-pay

## 2017-05-30 ENCOUNTER — Emergency Department (HOSPITAL_COMMUNITY): Payer: Medicare Other

## 2017-05-30 DIAGNOSIS — N2889 Other specified disorders of kidney and ureter: Secondary | ICD-10-CM | POA: Insufficient documentation

## 2017-05-30 DIAGNOSIS — Z794 Long term (current) use of insulin: Secondary | ICD-10-CM | POA: Insufficient documentation

## 2017-05-30 DIAGNOSIS — N189 Chronic kidney disease, unspecified: Secondary | ICD-10-CM | POA: Diagnosis not present

## 2017-05-30 DIAGNOSIS — N3000 Acute cystitis without hematuria: Secondary | ICD-10-CM | POA: Insufficient documentation

## 2017-05-30 DIAGNOSIS — E1140 Type 2 diabetes mellitus with diabetic neuropathy, unspecified: Secondary | ICD-10-CM | POA: Insufficient documentation

## 2017-05-30 DIAGNOSIS — I129 Hypertensive chronic kidney disease with stage 1 through stage 4 chronic kidney disease, or unspecified chronic kidney disease: Secondary | ICD-10-CM | POA: Diagnosis not present

## 2017-05-30 DIAGNOSIS — R1012 Left upper quadrant pain: Secondary | ICD-10-CM | POA: Diagnosis not present

## 2017-05-30 LAB — CBC WITH DIFFERENTIAL/PLATELET
Basophils Absolute: 0.1 10*3/uL (ref 0.0–0.1)
Basophils Relative: 1 %
EOS PCT: 3 %
Eosinophils Absolute: 0.2 10*3/uL (ref 0.0–0.7)
HCT: 37.2 % (ref 36.0–46.0)
HEMOGLOBIN: 10.9 g/dL — AB (ref 12.0–15.0)
LYMPHS ABS: 1.8 10*3/uL (ref 0.7–4.0)
Lymphocytes Relative: 24 %
MCH: 23.5 pg — AB (ref 26.0–34.0)
MCHC: 29.3 g/dL — AB (ref 30.0–36.0)
MCV: 80.3 fL (ref 78.0–100.0)
Monocytes Absolute: 0.5 10*3/uL (ref 0.1–1.0)
Monocytes Relative: 7 %
NEUTROS PCT: 65 %
Neutro Abs: 4.6 10*3/uL (ref 1.7–7.7)
Platelets: 228 10*3/uL (ref 150–400)
RBC: 4.63 MIL/uL (ref 3.87–5.11)
RDW: 16.7 % — ABNORMAL HIGH (ref 11.5–15.5)
WBC: 7.2 10*3/uL (ref 4.0–10.5)

## 2017-05-30 LAB — URINALYSIS, ROUTINE W REFLEX MICROSCOPIC
Bilirubin Urine: NEGATIVE
Glucose, UA: NEGATIVE mg/dL
Hgb urine dipstick: NEGATIVE
KETONES UR: NEGATIVE mg/dL
NITRITE: NEGATIVE
PH: 5 (ref 5.0–8.0)
Protein, ur: 100 mg/dL — AB
Specific Gravity, Urine: 1.012 (ref 1.005–1.030)

## 2017-05-30 LAB — COMPREHENSIVE METABOLIC PANEL
ALT: 25 U/L (ref 14–54)
ANION GAP: 11 (ref 5–15)
AST: 36 U/L (ref 15–41)
Albumin: 3.5 g/dL (ref 3.5–5.0)
Alkaline Phosphatase: 122 U/L (ref 38–126)
BUN: 26 mg/dL — ABNORMAL HIGH (ref 6–20)
CO2: 26 mmol/L (ref 22–32)
CREATININE: 1.5 mg/dL — AB (ref 0.44–1.00)
Calcium: 9.5 mg/dL (ref 8.9–10.3)
Chloride: 103 mmol/L (ref 101–111)
GFR, EST AFRICAN AMERICAN: 40 mL/min — AB (ref >60)
GFR, EST NON AFRICAN AMERICAN: 34 mL/min — AB (ref >60)
Glucose, Bld: 128 mg/dL — ABNORMAL HIGH (ref 65–99)
Potassium: 3.7 mmol/L (ref 3.5–5.1)
Sodium: 140 mmol/L (ref 135–145)
TOTAL PROTEIN: 7.1 g/dL (ref 6.5–8.1)
Total Bilirubin: 0.7 mg/dL (ref 0.3–1.2)

## 2017-05-30 LAB — LIPASE, BLOOD: Lipase: 34 U/L (ref 11–51)

## 2017-05-30 MED ORDER — SODIUM CHLORIDE 0.9 % IV SOLN
INTRAVENOUS | Status: DC
  Administered 2017-05-30: 14:00:00 via INTRAVENOUS

## 2017-05-30 MED ORDER — SODIUM CHLORIDE 0.9 % IV SOLN
1.0000 g | Freq: Once | INTRAVENOUS | Status: AC
  Administered 2017-05-30: 1 g via INTRAVENOUS
  Filled 2017-05-30: qty 10

## 2017-05-30 MED ORDER — ONDANSETRON HCL 4 MG/2ML IJ SOLN
4.0000 mg | Freq: Once | INTRAMUSCULAR | Status: DC
  Filled 2017-05-30: qty 2

## 2017-05-30 MED ORDER — ONDANSETRON 4 MG PO TBDP: 4.0000 mg | ORAL_TABLET | Freq: Three times a day (TID) | ORAL | 1 refills | Status: DC | PRN

## 2017-05-30 MED ORDER — CEPHALEXIN 500 MG PO CAPS: 500.0000 mg | ORAL_CAPSULE | Freq: Four times a day (QID) | ORAL | 0 refills | Status: DC

## 2017-05-30 MED ORDER — BARIUM SULFATE 2.1 % PO SUSP
ORAL | Status: AC
  Filled 2017-05-30: qty 2

## 2017-05-30 NOTE — ED Notes (Signed)
Pt ambulated to restroom with steady and even gait. 

## 2017-05-30 NOTE — ED Notes (Signed)
Pt to CT at this time.

## 2017-05-30 NOTE — ED Provider Notes (Signed)
[facility] EMERGENCY DEPARTMENT Provider Note   CSN: [id number] Arrival date & time: 05/30/17  1232     History   Chief Complaint Chief Complaint  Patient presents with  . Abdominal Pain    HPI Margaret Huerta is a 72 y.o. female.  Patient reports left upper quadrant abdominal pain for a month.  Associated with some nausea no vomiting is been some loose stools but no diarrhea.  Pain is in the lower anterior part of the chest and left upper quadrant part of the abdomen and moves to the back.  Patient has only one kidney and that is her left kidney.  Symptoms got worse today where it was just an ache before but now it has become a little stronger and more constant.     Past Medical History:  Diagnosis Date  . Anemia   . Anxiety   . Cervical cancer ([place])   . Chronic bronchitis ([place])    "get it q yr"  . Chronic lower back pain   . Depression   . Febrile seizure ([place])    "as a child"  . Fibromyalgia   . GERD (gastroesophageal reflux disease)   . Gout   . History of blood transfusion    "S/P tonsillectomy"  . History of hiatal hernia   . Hx of cardiovascular stress test April 2016   false positive Myoview  . Hyperlipidemia   . Hypertension   . Migraine    "monthly" (05/26/2014)  . Neuropathy   . Osteoporosis   . 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 Pneumonia "several times"  . Renal cancer ([place])    "right"-s/p nephrectomy  . Renal insufficiency    "left kidney works at 40-60%" (05/26/2014)  . Sleep apnea    "wore mask for 2 months; could not take it" (05/26/2014)  . Type II diabetes mellitus ([place])   . Typhus fever    "as a child"    Patient Active Problem List   Diagnosis Date Noted  . Numbness of left hand 12/18/2016  . Numbness 06/15/2016  . Chronic migraine 04/04/2016  . Neck pain 04/04/2016  . Other headache syndrome 04/04/2016  . Gait disturbance 04/04/2016  . Diabetic neuropathy ([place]) 04/04/2016  .  Subacromial bursitis of left shoulder joint 04/04/2016  . Depression 07/20/2014  . Syncope 06/29/2014  . Morbid obesity ([place]) 06/29/2014  . Normal coronary arteries 2003 and 05/26/14 05/27/2014  . Family history of coronary artery disease 05/27/2014  . H/O renal cell cancer-s/p nephrectomy 05/27/2014  . Abnormal stress test (false positive) 05/26/2014  . Chronic renal insufficiency 05/26/2014  . ALLERGIC RHINITIS 03/09/2010  . Dyslipidemia 01/10/2010  . Obstructive sleep apnea 01/10/2010  . Essential hypertension 01/10/2010    Past Surgical History:  Procedure Laterality Date  . ABDOMINAL HERNIA REPAIR  02/2013   "in [place]"  . ABDOMINAL HYSTERECTOMY  1977   "partial"  . APPENDECTOMY    . BACK SURGERY    . BILATERAL SALPINGOOPHORECTOMY Bilateral ~ 1993  . CARDIAC CATHETERIZATION  2003, April 2016   normal coronaries  . CATARACT EXTRACTION W/ INTRAOCULAR LENS  IMPLANT, BILATERAL  2015  . COLONOSCOPY N/A 11/25/2014   Procedure: COLONOSCOPY;  Surgeon: Rogene Houston, MD;  Location: [facility] SUITE;  Service: Endoscopy;  Laterality: N/A;  . CYSTOSCOPY W/ [place] MANIPULATION  1988  . DIAGNOSTIC LAPAROSCOPY  1975  . DILATION AND CURETTAGE OF UTERUS    . ESOPHAGOGASTRODUODENOSCOPY  N/A 11/25/2014   Procedure: ESOPHAGOGASTRODUODENOSCOPY (EGD);  Surgeon: Rogene Houston, MD;  Location: [facility] SU[facility];  Service: Endoscopy;  Laterality: N/A;  9:30 - moved to 11:25 - [name] to notify pt  . ESOPHAGOGASTRODUODENOSCOPY (EGD) WITH ESOPHAGEAL DILATION  1997  . EYE SURGERY    . GASTRIC BYPASS  2012  . HERNIA REPAIR     2015  . HIATAL HERNIA REPAIR  1996  . INCONTINENCE SURGERY  1983  . KNEE ARTHROSCOPY Left 2000's  . LAPAROSCOPIC CHOLECYSTECTOMY  1990's  . LEFT HEART CATHETERIZATION WITH CORONARY ANGIOGRAM N/A 05/27/2014   Procedure: LEFT HEART CATHETERIZATION WITH CORONARY ANGIOGRAM;  Surgeon: Lorretta Harp, MD;  Location: [facility] CATH LAB;  Service: Cardiovascular;  Laterality: N/A;  . LUMBAR  [place] SURGERY  2001   "herniated discs"  . [place]   "drilled into gum and put tooth implants uppers"  . NEPHRECTOMY Right 2002   "cancer"  . TMJ ARTHROPLASTY  1988  . TONSILLECTOMY  1976  . UPPER GI ENDOSCOPY       OB History   None      Home Medications    Prior to Admission medications   Medication Sig Start Date End Date Taking? Authorizing Provider  acetaminophen (TYLENOL) 325 MG tablet Take 2 tablets (650 mg total) by mouth every 4 (four) hours as needed for headache or mild pain. 05/27/14  Yes Kilroy, Luke K, PA-C  allopurinol (ZYLOPRIM) 300 MG tablet Take 300 mg by mouth daily.  05/09/17  Yes [provider]  atenolol (TENORMIN) 50 MG tablet TAKE ONE TABLET BY MOUTH ONCE DAILY Patient taking differently: TAKE ONE TABLET BY MOUTH twice a day 07/28/15  Yes Lorretta Harp, MD  cholecalciferol (VITAMIN D) 1000 UNITS tablet Take 5,000 Units by mouth daily.    Yes [provider]  Cyanocobalamin (VITAMIN B 12 PO) Take 1 tablet by mouth daily.   Yes [provider]  diphenhydrAMINE (BENADRYL) 25 mg capsule Take 25 mg by mouth 2 (two) times daily as needed.   Yes [provider]  DULoxetine (CYMBALTA) 60 MG capsule Take 60 mg by mouth daily.     Yes [provider]  furosemide (LASIX) 40 MG tablet Take 0.5 tablets (20 mg total) by mouth daily. Take 40 mg in the AM and 20 mg in the PM Patient taking differently: Take 40 mg by mouth 2 (two) times daily. Take 40 mg in the AM and 20 mg in the PM 05/29/14  Yes Kilroy, Lurena Joiner K, PA-C  insulin glargine (LANTUS) 100 UNIT/ML injection Inject 60 Units into the skin at bedtime.    Yes [provider]  LORazepam (ATIVAN) 0.5 MG tablet Take 1 tablet by mouth 2 (two) times daily as needed for sleep.  05/08/17  Yes [provider]  rosuvastatin (CRESTOR) 20 MG tablet Take 20 mg by mouth daily.     Yes [provider]  telmisartan (MICARDIS) 80 MG tablet Take 1 tablet by  mouth daily. 03/29/17  Yes [provider]  cephALEXin (KEFLEX) 500 MG capsule Take 1 capsule (500 mg total) by mouth 4 (four) times daily. 05/30/17   Fredia Sorrow, MD  NITROSTAT 0.4 MG SL tablet Place 0.4 mg under the tongue as needed. 05/24/14   [provider]    Family History Family History  Problem Relation Age of Onset  . Emphysema Father   . Heart disease Father   . Clotting disorder Mother   . Diabetes Mother   .  Kidney disease Mother   . Allergies Unknown        whole family per pt  . Lung cancer Maternal Uncle   . Alcohol abuse Maternal Uncle     Social History Social History   Tobacco Use  . Smoking status: Never Smoker  . Smokeless tobacco: Never Used  Substance Use Topics  . Alcohol use: No    Alcohol/week: 0.0 oz  . Drug use: No     Allergies   Demerol [meperidine]; Hydrocodone-acetaminophen; Meperidine hcl; Vicodin [hydrocodone-acetaminophen]; Ace inhibitors; Codeine; Contrast media [iodinated diagnostic agents]; Fentanyl; Hydromorphone hcl; Morphine; Naproxen; and Dilaudid [hydromorphone hcl]   Review of Systems Review of Systems  Constitutional: Negative for fever.  HENT: Negative for congestion.   Eyes: Negative for redness.  Respiratory: Negative for shortness of breath.   Cardiovascular: Positive for chest pain.  Gastrointestinal: Positive for abdominal pain. Negative for nausea and vomiting.  Genitourinary: Negative for dysuria.  Musculoskeletal: Positive for back pain.  Skin: Negative for rash.  Neurological: Negative for syncope and headaches.  Hematological: Does not bruise/bleed easily.  Psychiatric/Behavioral: Negative for confusion.     Physical Exam Updated Vital Signs BP (!) 170/89   Pulse (!) 58   Temp 98.1 F (36.7 C) (Oral)   Resp 18   Ht 1.575 m (5\' 2" )   Wt 87.1 kg (192 lb)   SpO2 99%   BMI 35.12 kg/m   Physical Exam  Constitutional: She is oriented to person, place, and time. She appears  well-developed and well-nourished. She does not appear ill. No distress.  HENT:  Head: Normocephalic and atraumatic.  Mouth/Throat: Oropharynx is clear and moist.  Eyes: Pupils are equal, round, and reactive to light. Conjunctivae and EOM are normal.  Neck: Neck supple.  Cardiovascular: Normal rate, regular rhythm and intact distal pulses.  Pulmonary/Chest: Effort normal and breath sounds normal. No respiratory distress. She exhibits tenderness.  Tender to palpation over anterior lower ribs on the left side.  Abdominal: Soft. Bowel sounds are normal. There is tenderness. There is no guarding.  Musculoskeletal: Normal range of motion.  Neurological: She is alert and oriented to person, place, and time. No cranial nerve deficit or sensory deficit. She exhibits normal muscle tone. Coordination normal.  Skin: Skin is warm. No rash noted.  Nursing note and vitals reviewed.    ED Treatments / Results  Labs (all labs ordered are listed, but only abnormal results are displayed) Labs Reviewed  CBC WITH DIFFERENTIAL/PLATELET - Abnormal; Notable for the following components:      Result Value   Hemoglobin 10.9 (*)    MCH 23.5 (*)    MCHC 29.3 (*)    RDW 16.7 (*)    All other components within normal limits  COMPREHENSIVE METABOLIC PANEL - Abnormal; Notable for the following components:   Glucose, Bld 128 (*)    BUN 26 (*)    Creatinine, Ser 1.50 (*)    GFR calc non Af Amer 34 (*)    GFR calc Af Amer 40 (*)    All other components within normal limits  URINALYSIS, ROUTINE W REFLEX MICROSCOPIC - Abnormal; Notable for the following components:   APPearance CLOUDY (*)    Protein, ur 100 (*)    Leukocytes, UA MODERATE (*)    Bacteria, UA MANY (*)    Squamous Epithelial / LPF 6-30 (*)    All other components within normal limits  URINE CULTURE  LIPASE, BLOOD    EKG None  Radiology Ct Abdomen  Pelvis Wo Contrast  Result Date: 05/30/2017 CLINICAL DATA:  LEFT upper quadrant pain for 2  months, worsening. Nausea and loose stools. History of diabetes, renal cancer, cervical cancer, hypertension, abdominal hernia repair, appendectomy, gastric bypass, cholecystectomy and RIGHT nephrectomy. EXAM: CT CHEST, ABDOMEN AND PELVIS WITHOUT CONTRAST TECHNIQUE: Multidetector CT imaging of the chest, abdomen and pelvis was performed following the standard protocol without IV contrast. COMPARISON:  CT abdomen dated 06/12/2012. FINDINGS: CT CHEST FINDINGS Cardiovascular: Heart size is normal. No pericardial effusion. Thoracic aorta is normal in caliber and configuration. Mediastinum/Nodes: No mass or enlarged lymph nodes within the mediastinum or perihilar regions. Esophagus appears normal. Trachea and central bronchi are unremarkable. Lungs/Pleura: Lungs are clear.  No pleural effusion or pneumothorax. Musculoskeletal: No acute or suspicious osseous finding. CT ABDOMEN PELVIS FINDINGS Hepatobiliary: No focal liver abnormality is seen. Status post cholecystectomy. No biliary dilatation. Pancreas: Unremarkable. No pancreatic ductal dilatation or surrounding inflammatory changes. Spleen: Normal in size without focal abnormality. Adrenals/Urinary Tract: Status post RIGHT nephrectomy. Multiple LEFT renal masses, largest isodense to hyperdense measuring up to 1.5 cm greatest dimension. LEFT perinephric stranding, of uncertain chronicity, more prominent than on previous CT of 06/12/2012. No hydronephrosis. No renal or ureteral calculi. Bladder appears normal. Stomach/Bowel: No dilated large or small bowel loops. No bowel wall thickening or evidence of bowel wall inflammation. Surgical changes about the stomach, without evidence of surgical complicating feature. Vascular/Lymphatic: Aortic atherosclerosis. No enlarged abdominal or pelvic lymph nodes. Reproductive: Status post hysterectomy. No adnexal masses. Other: No free fluid or abscess collection. No free intraperitoneal air. Musculoskeletal: No acute or suspicious  osseous finding. Mild degenerative change in the lumbar spine. Surgical changes compatible with previous entire abdominal wall hernia repair. No evidence of recurrent hernia or post surgical complicating feature. IMPRESSION: 1. No acute findings within the chest, abdomen or pelvis. 2. **An incidental finding of potential clinical significance has been found. Multiple LEFT renal masses, largest of which are isodense and hyperdense measuring up to 1.5 cm greatest dimension, including an isodense mass exophytic to the posterior cortex with Hounsfield unit measurement of 27. Per consensus guidelines, and given the history of RIGHT nephrectomy, recommend nonemergent MRI kidneys with and without contrast to ensure benignity.** 3. LEFT perinephric stranding, favored to be chronic. However, without IV contrast, it is difficult to exclude an acute pyelonephritis with this appearance. Recommend correlation with clinical symptoms and urinalysis. 4. Aortic atherosclerosis. Electronically Signed   By: Franki Cabot M.D.   On: 05/30/2017 17:06   Ct Chest Wo Contrast  Result Date: 05/30/2017 CLINICAL DATA:  LEFT upper quadrant pain for 2 months, worsening. Nausea and loose stools. History of diabetes, renal cancer, cervical cancer, hypertension, abdominal hernia repair, appendectomy, gastric bypass, cholecystectomy and RIGHT nephrectomy. EXAM: CT CHEST, ABDOMEN AND PELVIS WITHOUT CONTRAST TECHNIQUE: Multidetector CT imaging of the chest, abdomen and pelvis was performed following the standard protocol without IV contrast. COMPARISON:  CT abdomen dated 06/12/2012. FINDINGS: CT CHEST FINDINGS Cardiovascular: Heart size is normal. No pericardial effusion. Thoracic aorta is normal in caliber and configuration. Mediastinum/Nodes: No mass or enlarged lymph nodes within the mediastinum or perihilar regions. Esophagus appears normal. Trachea and central bronchi are unremarkable. Lungs/Pleura: Lungs are clear.  No pleural effusion or  pneumothorax. Musculoskeletal: No acute or suspicious osseous finding. CT ABDOMEN PELVIS FINDINGS Hepatobiliary: No focal liver abnormality is seen. Status post cholecystectomy. No biliary dilatation. Pancreas: Unremarkable. No pancreatic ductal dilatation or surrounding inflammatory changes. Spleen: Normal in size without focal abnormality. Adrenals/Urinary Tract: Status  post RIGHT nephrectomy. Multiple LEFT renal masses, largest isodense to hyperdense measuring up to 1.5 cm greatest dimension. LEFT perinephric stranding, of uncertain chronicity, more prominent than on previous CT of 06/12/2012. No hydronephrosis. No renal or ureteral calculi. Bladder appears normal. Stomach/Bowel: No dilated large or small bowel loops. No bowel wall thickening or evidence of bowel wall inflammation. Surgical changes about the stomach, without evidence of surgical complicating feature. Vascular/Lymphatic: Aortic atherosclerosis. No enlarged abdominal or pelvic lymph nodes. Reproductive: Status post hysterectomy. No adnexal masses. Other: No free fluid or abscess collection. No free intraperitoneal air. Musculoskeletal: No acute or suspicious osseous finding. Mild degenerative change in the lumbar spine. Surgical changes compatible with previous entire abdominal wall hernia repair. No evidence of recurrent hernia or post surgical complicating feature. IMPRESSION: 1. No acute findings within the chest, abdomen or pelvis. 2. **An incidental finding of potential clinical significance has been found. Multiple LEFT renal masses, largest of which are isodense and hyperdense measuring up to 1.5 cm greatest dimension, including an isodense mass exophytic to the posterior cortex with Hounsfield unit measurement of 27. Per consensus guidelines, and given the history of RIGHT nephrectomy, recommend nonemergent MRI kidneys with and without contrast to ensure benignity.** 3. LEFT perinephric stranding, favored to be chronic. However, without IV  contrast, it is difficult to exclude an acute pyelonephritis with this appearance. Recommend correlation with clinical symptoms and urinalysis. 4. Aortic atherosclerosis. Electronically Signed   By: Franki Cabot M.D.   On: 05/30/2017 17:06    Procedures Procedures (including critical care time)  Medications Ordered in ED Medications  ondansetron (ZOFRAN) injection 4 mg (0 mg Intravenous Hold 05/30/17 1427)  0.9 %  sodium chloride infusion ( Intravenous Stopped 05/30/17 1849)  Barium Sulfate 2.1 % SUSP (has no administration in time range)  cefTRIAXone (ROCEPHIN) 1 g in sodium chloride 0.9 % 100 mL IVPB (0 g Intravenous Stopped 05/30/17 1841)     Initial Impression / Assessment and Plan / ED Course  I have reviewed the triage vital signs and the nursing notes.  Pertinent labs & imaging results that were available during my care of the patient were reviewed by me and considered in my medical decision making (see chart for details).     Patient's workup here without any acute intra-abdominal findings.  Patient does have only one kidney is on the left side and that is showing some evidence of masses or lumps.  Renal function is actually improved compared to her renal function in the past.  No leukocytosis no fevers.  Urine culture sent for urinalysis suggestive of urinary tract infection.  CT not done with contrast so not able to completely determine if pyelonephritis is present.  Patient certainly not toxic but given 1 dose of IV Rocephin here will be continued on Keflex with close follow-up with her primary care doctor to make sure she gets better radiology is recommending outpatient MRI to further evaluate that left kidney.  Patient aware.  Patient does not want any pain medicine.  Patient has a contrast allergy to thrive scans were done without contrast.  This included the CT of the chest.  No acute findings on CT of the chest.  Patient not tachycardic not hypoxic.  Do not feel that there is  evidence of pulmonary embolus.  Is tender to palpation over that part of the chest so I do also do not think this is cardiac in nature.    Final Clinical Impressions(s) / ED Diagnoses   Final diagnoses:  Left renal mass  Acute cystitis without hematuria    ED Discharge Orders        Ordered    cephALEXin (KEFLEX) 500 MG capsule  4 times daily     05/30/17 1900       Fredia Sorrow, MD 05/30/17 1908

## 2017-05-30 NOTE — ED Triage Notes (Signed)
Pt reports LUQ pain x 2 months that has been progressively getting worse.  Reports nausea and loose stools but no vomiting or diarrhea.

## 2017-05-30 NOTE — Discharge Instructions (Addendum)
Follow-up with your doctor in about a week to make sure the urinary tract infection is clearing up.  Also will need to make arrangements for an outpatient MRI to evaluate the lumps on the left kidney.  You can do this with your nephrologist or with your primary care doctor.  Return for any new or worse symptoms such as fevers worse pain persistent vomiting.  Urine culture has been sent and is pending.

## 2017-06-02 LAB — URINE CULTURE: Culture: >=100000 — AB

## 2017-06-03 ENCOUNTER — Ambulatory Visit (INDEPENDENT_AMBULATORY_CARE_PROVIDER_SITE_OTHER): Payer: Self-pay | Admitting: Internal Medicine

## 2017-06-03 ENCOUNTER — Telehealth: Payer: Self-pay | Admitting: *Deleted

## 2017-06-03 NOTE — Telephone Encounter (Signed)
Post ED Visit - Positive Culture Follow-up  Culture report reviewed by antimicrobial stewardship pharmacist:  [x]  Elenor Quinones, Pharm.D. []  Heide Guile, Pharm.D., BCPS AQ-ID []  Parks Neptune, Pharm.D., BCPS []  Alycia Rossetti, Pharm.D., BCPS []  [place], [place].D., BCPS, AAHIVP []  Legrand Como, Pharm.D., BCPS, AAHIVP []  Salome Arnt, PharmD, BCPS []  Jalene Mullet, PharmD []  Vincenza Hews, PharmD, BCPS  Positive  urine culture Treated with Cephalexin, organism sensitive to the same and no further patient follow-up is required at this time.  Harlon Flor [facility] 06/03/2017, 10:17 AM

## 2017-06-04 ENCOUNTER — Ambulatory Visit (INDEPENDENT_AMBULATORY_CARE_PROVIDER_SITE_OTHER): Payer: Self-pay | Admitting: Internal Medicine

## 2017-06-10 ENCOUNTER — Ambulatory Visit (INDEPENDENT_AMBULATORY_CARE_PROVIDER_SITE_OTHER): Payer: Medicare Other | Admitting: Internal Medicine

## 2017-06-10 ENCOUNTER — Encounter (INDEPENDENT_AMBULATORY_CARE_PROVIDER_SITE_OTHER): Payer: Self-pay | Admitting: Internal Medicine

## 2017-06-10 ENCOUNTER — Other Ambulatory Visit (HOSPITAL_COMMUNITY): Payer: Self-pay | Admitting: Internal Medicine

## 2017-06-10 VITALS — BP 142/90 | HR 72 | Temp 98.2°F | Ht 62.0 in | Wt 192.7 lb

## 2017-06-10 DIAGNOSIS — R1012 Left upper quadrant pain: Secondary | ICD-10-CM

## 2017-06-10 DIAGNOSIS — N2889 Other specified disorders of kidney and ureter: Secondary | ICD-10-CM

## 2017-06-10 NOTE — Progress Notes (Signed)
Subjective:    Patient ID: Margaret Huerta, female    DOB: 02-11-1946, 72 y.o.   MRN: [id number]  HPI Here today for f/u. Last seen in April of 2018 which was  191. Today her weight is 192.7 Hx of weight loss. At last OV  She had gained almost 3 pounds.  Just finished Keflex 500mg  for a UTI.  Seen in the ED at [facility] in 05/30/2017 and was told she had a UTI. Also found to have a left kidney mass and it going to have MRI scheduled. She points to her mid left abdomen radiating into her back. She has had the pain x 1 month. No fever, no chills. BMs are normal. Has a BM x 3 a day and sometimes more. No change however.  Her appetite is good for the most part. Hx of CKD stage 3 . Hx of rt renal cell cancer/ s/p nephrectomy.    05/30/2017 CT abdomen/pelvis with CM:   IMPRESSION: 1. No acute findings within the chest, abdomen or pelvis. 2. An incidental finding of potential clinical significance has been found. Multiple LEFT renal masses, largest of which are isodense and hyperdense measuring up to 1.5 cm greatest dimension, including an isodense mass exophytic to the posterior cortex with Hounsfield unit measurement of 27. Per consensus guidelines, and given the history of RIGHT nephrectomy, recommend nonemergent MRI kidneys with and without contrast to ensure benignity.** 3. LEFT perinephric stranding, favored to be chronic. However, without IV contrast, it is difficult to exclude an acute pyelonephritis with this appearance. Recommend correlation with clinical symptoms and urinalysis. 4. Aortic atherosclerosis.       1013/2016: EGD &Colonoscopy  Indications: Patient is 72 year old Caucasian female with multiple medical problems including long-standing type 2 diabetes mellitus who presents with over 9 month history of nausea and anorexia 40 pound weight loss and diarrhea. She has history of colonic polyp. She had sessile serrated polyp removed 5 years  ago.  Impression:  EGD findings: Small gastric pouch with patent gastrojejunostomy. Short afferent limb of Roux-en-Y anastomosis. Normal jejunal mucosa for up to 30 cm. Random biopsies taken from small bowel mucosa distal to gastrojejunostomy.  Colonoscopy findings: Examination performed to cecum. 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 No evidence of recurrent polyps or endoscopic colitis. Random biopsies taken from mucosa of sigmoid colon. Small internal and external hemorrhoids.   Review of Systems Past Medical History:  Diagnosis Date  . Anemia   . Anxiety   . Cervical cancer ([place])   . Chronic bronchitis ([place])    "get it q yr"  . Chronic lower back pain   . Depression   . Febrile seizure ([place])    "as a child"  . Fibromyalgia   . GERD (gastroesophageal reflux disease)   . Gout   . History of blood transfusion    "S/P tonsillectomy"  . History of hiatal hernia   . Hx of cardiovascular stress test April 2016   false positive Myoview  . Hyperlipidemia   . Hypertension   . Migraine    "monthly" (05/26/2014)  . Neuropathy   . Osteoporosis   . Parathyroid disease ([place])    "my PHT levels run high; I can't take calcium"  . Pneumonia "several times"  . Renal cancer ([place])    "right"-s/p nephrectomy  . Renal insufficiency    "left kidney works at 40-60%" (05/26/2014)  . Sleep apnea    "wore mask for 2 months; could not take it" (05/26/2014)  .  Type II diabetes mellitus ([place])   . Typhus fever    "as a child"    Past Surgical History:  Procedure Laterality Date  . ABDOMINAL HERNIA REPAIR  02/2013   "in [place]"  . ABDOMINAL HYSTERECTOMY  1977   "partial"  . APPENDECTOMY    . BACK SURGERY    . BILATERAL SALPINGOOPHORECTOMY Bilateral ~ 1993  . CARDIAC CATHETERIZATION  2003, April 2016   normal coronaries  . CATARACT  EXTRACTION W/ INTRAOCULAR LENS  IMPLANT, BILATERAL  2015  . COLONOSCOPY N/A 11/25/2014   Procedure: COLONOSCOPY;  Surgeon: Rogene Houston, MD;  Location: [facility];  Service: Endoscopy;  Laterality: N/A;  . CYSTOSCOPY W/ [place] MANIPULATION  1988  . DIAGNOSTIC LAPAROSCOPY  1975  . DILATION AND CURETTAGE OF UTERUS    . ESOPHAGOGASTRODUODENOSCOPY N/A 11/25/2014   Procedure: ESOPHAGOGASTRODUODENOSCOPY (EGD);  Surgeon: Rogene Houston, MD;  Location: [facility];  Service: Endoscopy;  Laterality: N/A;  9:30 - moved to 11:25 - [name] to notify pt  . ESOPHAGOGASTRODUODENOSCOPY (EGD) WITH ESOPHAGEAL DILATION  1997  . EYE SURGERY    . GASTRIC BYPASS  2012  . HERNIA REPAIR     2015  . HIATAL HERNIA REPAIR  1996  . INCONTINENCE SURGERY  1983  . KNEE ARTHROSCOPY Left 2000's  . LAPAROSCOPIC CHOLECYSTECTOMY  1990's  . LEFT HEART CATHETERIZATION WITH CORONARY ANGIOGRAM N/A 05/27/2014   Procedure: LEFT HEART CATHETERIZATION WITH CORONARY ANGIOGRAM;  Surgeon: Lorretta Harp, MD;  Location: [facility] CATH LAB;  Service: Cardiovascular;  Laterality: N/A;  . LUMBAR [place] SURGERY  2001   "herniated discs"  . [place]   "drilled into gum and put tooth implants uppers"  . NEPHRECTOMY Right 2002   "cancer"  . TMJ ARTHROPLASTY  1988  . TONSILLECTOMY  1976  . UPPER GI ENDOSCOPY      Allergies  Allergen Reactions  . Demerol [Meperidine] Shortness Of Breath    Stop breathing  . Hydrocodone-Acetaminophen Shortness Of Breath    REACTION: stop breathing  . Meperidine Hcl Shortness Of Breath    REACTION: stop breathing  . Vicodin [Hydrocodone-Acetaminophen] Shortness Of Breath    Stop breathing  . Ace Inhibitors Cough  . Codeine     REACTION: hallucination, confused  . Contrast Media [Iodinated Diagnostic Agents]     MRI dye. Shaky, nausea  . Fentanyl Itching  . Hydromorphone Hcl     REACTION: itching, rash, nausea, vomiting  . Morphine     REACTION: vomiting  . Naproxen     REACTION:  stomach lesion  . Dilaudid [Hydromorphone Hcl] Itching, Nausea And Vomiting and Rash    Current Outpatient Medications on File Prior to Visit  Medication Sig Dispense Refill  . acetaminophen (TYLENOL) 325 MG tablet Take 2 tablets (650 mg total) by mouth every 4 (four) hours as needed for headache or mild pain.    Marland Kitchen allopurinol (ZYLOPRIM) 300 MG tablet Take 300 mg by mouth daily.   12  . atenolol (TENORMIN) 50 MG tablet TAKE ONE TABLET BY MOUTH ONCE DAILY (Patient taking differently: TAKE ONE TABLET BY MOUTH twice a day) 30 tablet 7  . cholecalciferol (VITAMIN D) 1000 UNITS tablet Take 5,000 Units by mouth daily.     . Cyanocobalamin (VITAMIN B 12 PO) Take 1 tablet by mouth daily.    . diphenhydrAMINE (BENADRYL) 25 mg capsule Take 25 mg by mouth 2 (two) times daily as needed.    . DULoxetine (CYMBALTA) 60 MG  capsule Take 60 mg by mouth daily.      . furosemide (LASIX) 40 MG tablet Take 0.5 tablets (20 mg total) by mouth daily. Take 40 mg in the AM and 20 mg in the PM (Patient taking differently: Take 40 mg by mouth 2 (two) times daily. Take 40 mg in the AM and 20 mg in the PM) 30 tablet   . insulin glargine (LANTUS) 100 UNIT/ML injection Inject 60 Units into the skin at bedtime.     Marland Kitchen LORazepam (ATIVAN) 0.5 MG tablet Take 1 tablet by mouth 2 (two) times daily as needed for sleep.   3  . NITROSTAT 0.4 MG SL tablet Place 0.4 mg under the tongue as needed.    . rosuvastatin (CRESTOR) 20 MG tablet Take 20 mg by mouth.     . telmisartan (MICARDIS) 80 MG tablet Take 1 tablet by mouth daily.     No current facility-administered medications on file prior to visit.         Objective:   Physical Exam  Blood pressure (!) 142/90, pulse 72, temperature 98.2 F (36.8 C), height 5\' 2"  (1.575 m), weight 192 lb 11.2 oz (87.4 kg). Alert and oriented. Skin warm and dry. Oral mucosa is moist.   . Sclera anicteric, conjunctivae is pink. Thyroid not enlarged. No cervical lymphadenopathy. Lungs clear. Heart  regular rate and rhythm.  Abdomen is soft. Bowel sounds are positive. No hepatomegaly. No abdominal masses felt. No tenderness.  No edema to lower extremities.         Assessment & Plan:  Left mid abdominal pain. Just treated for a UTI. MRI will be scheduled by Dr. Ria Comment office. Will check on MRI

## 2017-06-10 NOTE — Patient Instructions (Signed)
OV in 1 year.  

## 2017-06-11 DIAGNOSIS — H3561 Retinal hemorrhage, right eye: Secondary | ICD-10-CM | POA: Diagnosis not present

## 2017-06-11 DIAGNOSIS — E113293 Type 2 diabetes mellitus with mild nonproliferative diabetic retinopathy without macular edema, bilateral: Secondary | ICD-10-CM | POA: Diagnosis not present

## 2017-06-11 DIAGNOSIS — H35073 Retinal telangiectasis, bilateral: Secondary | ICD-10-CM | POA: Diagnosis not present

## 2017-06-11 DIAGNOSIS — H35359 Cystoid macular degeneration, unspecified eye: Secondary | ICD-10-CM | POA: Diagnosis not present

## 2017-06-11 DIAGNOSIS — H43811 Vitreous degeneration, right eye: Secondary | ICD-10-CM | POA: Diagnosis not present

## 2017-06-13 ENCOUNTER — Ambulatory Visit (HOSPITAL_COMMUNITY)
Admission: RE | Admit: 2017-06-13 | Discharge: 2017-06-13 | Disposition: A | Discharge status: Home / Self Care | Payer: Medicare Other | Source: Ambulatory Visit | Attending: Internal Medicine | Admitting: Internal Medicine

## 2017-06-13 DIAGNOSIS — N2889 Other specified disorders of kidney and ureter: Secondary | ICD-10-CM | POA: Diagnosis not present

## 2017-06-13 DIAGNOSIS — Z9049 Acquired absence of other specified parts of digestive tract: Secondary | ICD-10-CM | POA: Diagnosis not present

## 2017-06-17 DIAGNOSIS — N184 Chronic kidney disease, stage 4 (severe): Secondary | ICD-10-CM | POA: Diagnosis not present

## 2017-06-17 DIAGNOSIS — E1129 Type 2 diabetes mellitus with other diabetic kidney complication: Secondary | ICD-10-CM | POA: Diagnosis not present

## 2017-06-17 DIAGNOSIS — Z79899 Other long term (current) drug therapy: Secondary | ICD-10-CM | POA: Diagnosis not present

## 2017-06-19 ENCOUNTER — Ambulatory Visit: Payer: Self-pay | Admitting: Neurology

## 2017-06-24 DIAGNOSIS — N184 Chronic kidney disease, stage 4 (severe): Secondary | ICD-10-CM | POA: Diagnosis not present

## 2017-06-24 DIAGNOSIS — E1122 Type 2 diabetes mellitus with diabetic chronic kidney disease: Secondary | ICD-10-CM | POA: Diagnosis not present

## 2017-06-24 DIAGNOSIS — Z6835 Body mass index (BMI) 35.0-35.9, adult: Secondary | ICD-10-CM | POA: Diagnosis not present

## 2017-06-24 DIAGNOSIS — F411 Generalized anxiety disorder: Secondary | ICD-10-CM | POA: Diagnosis not present

## 2017-06-25 ENCOUNTER — Telehealth (INDEPENDENT_AMBULATORY_CARE_PROVIDER_SITE_OTHER): Payer: Self-pay | Admitting: Internal Medicine

## 2017-06-25 NOTE — Telephone Encounter (Signed)
Patient wants you to call her at [phone]

## 2017-06-26 NOTE — Telephone Encounter (Signed)
I have spoken with patient 

## 2017-07-23 ENCOUNTER — Encounter (INDEPENDENT_AMBULATORY_CARE_PROVIDER_SITE_OTHER): Payer: Self-pay | Admitting: Internal Medicine

## 2017-07-23 ENCOUNTER — Ambulatory Visit (INDEPENDENT_AMBULATORY_CARE_PROVIDER_SITE_OTHER): Payer: Medicare Other | Admitting: Internal Medicine

## 2017-07-23 VITALS — BP 140/90 | HR 56 | Temp 98.2°F | Ht 62.0 in | Wt 190.4 lb

## 2017-07-23 DIAGNOSIS — R1013 Epigastric pain: Secondary | ICD-10-CM | POA: Diagnosis not present

## 2017-07-23 MED ORDER — DICYCLOMINE HCL 10 MG PO CAPS: 10.0000 mg | ORAL_CAPSULE | Freq: Two times a day (BID) | ORAL | 3 refills | Status: DC

## 2017-07-23 MED ORDER — PANTOPRAZOLE SODIUM 40 MG PO TBEC: 40.0000 mg | DELAYED_RELEASE_TABLET | Freq: Every day | ORAL | 3 refills | Status: DC

## 2017-07-23 NOTE — Progress Notes (Signed)
Subjective:    Patient ID: Margaret Huerta, female    DOB: [birth year], 72 y.o.   MRN: [id number] Last wt 192.7 06/10/2017 HPI Here today with  c/o of epigastric pain.  Sometimes she has sharp pain. She says she is not having any acid reflux. When she eats, she will have nausea. She also has to have a BM after eating. Maybe 30 minutes later, she will have a BM and it becomes softer. She has a hx of IBS.  She has an appt this month to see her Urologist.  Last seen 06/10/2016. C/o mid left abdominal pain which radiated into her back.  She underwent an MRI 5/2/2-019 which revealed  IMPRESSION: 1. Exam detail is diminished due to lack of IV contrast material. 2. Multiple left kidney lesions of varying signal intensity are  identified and correspond to the kidney lesions identified on 05/30/2017. Based on unenhanced technique lesions may represent a combination of hemorrhagic, proteinaceous and simple cysts. No definite solid kidney lesions identified. Given the mild complexity of these lesions a follow-up examination in 6-12 months with repeat MRI is advised. At this time the study of choice would be a contrast enhanced MRI, which if the patient's GFR is diminished but greater than 30, could be performed using decreased dose of contrast material to accommodate for the impaired renal function. 3. Prior cholecystectomy.   Hx of CKD stage 3 . Hx of rt renal cell cancer/ s/p nephrectomy.    05/30/2017 CT abdomen/pelvis with CM:   IMPRESSION: 1. No acute findings within the chest, abdomen or pelvis. 2. An incidental finding of potential clinical significance has been found. Multiple LEFT renal masses, largest of which are isodense and hyperdense measuring up to 1.5 cm greatest dimension, including an isodense mass exophytic to the posterior cortex with Hounsfield unit measurement of 27. Per consensus guidelines, and given the history of RIGHT nephrectomy, recommend nonemergent  MRI kidneys with and without contrast to ensure benignity.** 3. LEFT perinephric stranding, favored to be chronic. However, without IV contrast, it is difficult to exclude an acute pyelonephritis with this appearance. Recommend correlation with clinical symptoms and urinalysis. 4. Aortic atherosclerosis.   1013/2016: EGD &Colonoscopy  Indications: Patient is 71 year old Caucasian female with multiple medical problems including long-standing type 2 diabetes mellitus who presents with over 9 month history of nausea and anorexia 40 pound weight loss and diarrhea. She has history of colonic polyp. She had sessile serrated polyp removed 5 years ago.  Impression:  EGD findings: Small gastric pouch with patent gastrojejunostomy. Short afferent limb of Roux-en-Y anastomosis. Normal jejunal mucosa for up to 30 cm. Random biopsies taken from small bowel mucosa distal to gastrojejunostomy.  Colonoscopy findings: Examination performed to cecum. 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 No evidence of recurrent polyps or endoscopic colitis. Random biopsies taken from mucosa of sigmoid colon. Small internal and external hemorrhoids.     Review of Systems Past Medical History:  Diagnosis Date  . Anemia   . Anxiety   . Cervical cancer ([place])   . Chronic bronchitis ([place])    "get it q yr"  . Chronic lower back pain   . Depression   . Febrile seizure ([place])    "as a child"  . Fibromyalgia   . GERD (gastroesophageal reflux disease)   . Gout   . History of blood transfusion    "S/P tonsillectomy"  . History of hiatal hernia   . Hx of cardiovascular stress test April 2016  false positive Myoview  . Hyperlipidemia   . Hypertension   . Migraine    "monthly" (05/26/2014)  . Neuropathy   . Osteoporosis   . 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 Pneumonia "several times"  . Renal cancer ([place])    "right"-s/p nephrectomy  . Renal insufficiency    "left kidney works at 40-60%" (05/26/2014)  . Sleep apnea    "wore mask for 2 months; could not take it" (05/26/2014)  . Type II diabetes mellitus ([place])   . Typhus fever    "as a child"    Past Surgical History:  Procedure Laterality Date  . ABDOMINAL HERNIA REPAIR  02/2013   "in [place]"  . ABDOMINAL HYSTERECTOMY  1977   "partial"  . APPENDECTOMY    . BACK SURGERY    . BILATERAL SALPINGOOPHORECTOMY Bilateral ~ 1993  . CARDIAC CATHETERIZATION  2003, April 2016   normal coronaries  . CATARACT EXTRACTION W/ INTRAOCULAR LENS  IMPLANT, BILATERAL  2015  . COLONOSCOPY N/A 11/25/2014   Procedure: COLONOSCOPY;  Surgeon: Rogene Houston, MD;  Location: [facility];  Service: Endoscopy;  Laterality: N/A;  . CYSTOSCOPY W/ [place] MANIPULATION  1988  . DIAGNOSTIC LAPAROSCOPY  1975  . DILATION AND CURETTAGE OF UTERUS    . ESOPHAGOGASTRODUODENOSCOPY N/A 11/25/2014   Procedure: ESOPHAGOGASTRODUODENOSCOPY (EGD);  Surgeon: Rogene Houston, MD;  Location: [facility];  Service: Endoscopy;  Laterality: N/A;  9:30 - moved to 11:25 - [name] to notify pt  . ESOPHAGOGASTRODUODENOSCOPY (EGD) WITH ESOPHAGEAL DILATION  1997  . EYE SURGERY    . GASTRIC BYPASS  2012  . HERNIA REPAIR     2015  . HIATAL HERNIA REPAIR  1996  . INCONTINENCE SURGERY  1983  . KNEE ARTHROSCOPY Left 2000's  . LAPAROSCOPIC CHOLECYSTECTOMY  1990's  . LEFT HEART CATHETERIZATION WITH CORONARY ANGIOGRAM N/A 05/27/2014   Procedure: LEFT HEART CATHETERIZATION WITH CORONARY ANGIOGRAM;  Surgeon: Lorretta Harp, MD;  Location: [facility] CATH LAB;  Service: Cardiovascular;  Laterality: N/A;  . LUMBAR [place] SURGERY  2001   "herniated discs"  . [place]   "drilled into gum and put tooth implants uppers"  . NEPHRECTOMY Right 2002   "cancer"  . TMJ ARTHROPLASTY  1988  . TONSILLECTOMY  1976    . UPPER GI ENDOSCOPY      Allergies  Allergen Reactions  . Demerol [Meperidine] Shortness Of Breath    Stop breathing  . Hydrocodone-Acetaminophen Shortness Of Breath    REACTION: stop breathing  . Meperidine Hcl Shortness Of Breath    REACTION: stop breathing  . Vicodin [Hydrocodone-Acetaminophen] Shortness Of Breath    Stop breathing  . Ace Inhibitors Cough  . Codeine     REACTION: hallucination, confused  . Contrast Media [Iodinated Diagnostic Agents]     MRI dye. Shaky, nausea  . Fentanyl Itching  . Hydromorphone Hcl     REACTION: itching, rash, nausea, vomiting  . Morphine     REACTION: vomiting  . Naproxen     REACTION: stomach lesion  . Dilaudid [Hydromorphone Hcl] Itching, Nausea And Vomiting and Rash    Current Outpatient Medications on File Prior to Visit  Medication Sig Dispense Refill  . acetaminophen (TYLENOL) 325 MG tablet Take 2 tablets (650 mg total) by mouth every 4 (four) hours as needed for headache or mild pain.    Marland Kitchen allopurinol (ZYLOPRIM) 300 MG tablet Take 300 mg  by mouth daily.   12  . atenolol (TENORMIN) 50 MG tablet TAKE ONE TABLET BY MOUTH ONCE DAILY (Patient taking differently: TAKE ONE TABLET BY MOUTH twice a day) 30 tablet 7  . cholecalciferol (VITAMIN D) 1000 UNITS tablet Take 5,000 Units by mouth daily.     . Cyanocobalamin (VITAMIN B 12 PO) Take 1 tablet by mouth daily.    . diphenhydrAMINE (BENADRYL) 25 mg capsule Take 25 mg by mouth 2 (two) times daily as needed.    . DULoxetine (CYMBALTA) 60 MG capsule Take 60 mg by mouth daily.      . furosemide (LASIX) 40 MG tablet Take 0.5 tablets (20 mg total) by mouth daily. Take 40 mg in the AM and 20 mg in the PM (Patient taking differently: Take 40 mg by mouth 2 (two) times daily. Take 40 mg in the AM and 20 mg in the PM) 30 tablet   . insulin glargine (LANTUS) 100 UNIT/ML injection Inject 60 Units into the skin at bedtime.     Marland Kitchen LORazepam (ATIVAN) 0.5 MG tablet Take 1 tablet by mouth 2 (two) times  daily as needed for sleep.   3  . NITROSTAT 0.4 MG SL tablet Place 0.4 mg under the tongue as needed.    . rosuvastatin (CRESTOR) 20 MG tablet Take 20 mg by mouth.     . telmisartan (MICARDIS) 80 MG tablet Take 1 tablet by mouth daily.     No current facility-administered medications on file prior to visit.         Objective:   Physical Exam Blood pressure 140/90, pulse (!) 56, temperature 98.2 F (36.8 C), height 5\' 2"  (1.575 m), weight 190 lb 6.4 oz (86.4 kg). Alert and oriented. Skin warm and dry. Oral mucosa is moist.   . Sclera anicteric, conjunctivae is pink. Thyroid not enlarged. No cervical lymphadenopathy. Lungs clear. Heart regular rate and rhythm.  Abdomen is soft. Bowel sounds are positive. No hepatomegaly. No abdominal masses felt. Tenderness under left ribs. No edema to lower extremities.           Assessment & Plan:  Epigastric pain: am going to start her on Protonix. IBS: Dicyclomine 10mg  BID.  OV in 3 months

## 2017-07-23 NOTE — Patient Instructions (Signed)
Rx sent to her pharmacy. OV in 3 months

## 2017-08-08 DIAGNOSIS — R809 Proteinuria, unspecified: Secondary | ICD-10-CM | POA: Diagnosis not present

## 2017-08-08 DIAGNOSIS — E1129 Type 2 diabetes mellitus with other diabetic kidney complication: Secondary | ICD-10-CM | POA: Diagnosis not present

## 2017-08-08 DIAGNOSIS — D649 Anemia, unspecified: Secondary | ICD-10-CM | POA: Diagnosis not present

## 2017-08-08 DIAGNOSIS — C641 Malignant neoplasm of right kidney, except renal pelvis: Secondary | ICD-10-CM | POA: Diagnosis not present

## 2017-08-08 DIAGNOSIS — N183 Chronic kidney disease, stage 3 (moderate): Secondary | ICD-10-CM | POA: Diagnosis not present

## 2017-08-08 DIAGNOSIS — I10 Essential (primary) hypertension: Secondary | ICD-10-CM | POA: Diagnosis not present

## 2017-08-12 DIAGNOSIS — Z79899 Other long term (current) drug therapy: Secondary | ICD-10-CM | POA: Diagnosis not present

## 2017-08-12 DIAGNOSIS — N183 Chronic kidney disease, stage 3 (moderate): Secondary | ICD-10-CM | POA: Diagnosis not present

## 2017-08-12 DIAGNOSIS — I10 Essential (primary) hypertension: Secondary | ICD-10-CM | POA: Diagnosis not present

## 2017-08-12 DIAGNOSIS — E559 Vitamin D deficiency, unspecified: Secondary | ICD-10-CM | POA: Diagnosis not present

## 2017-08-12 DIAGNOSIS — Z1159 Encounter for screening for other viral diseases: Secondary | ICD-10-CM | POA: Diagnosis not present

## 2017-08-12 DIAGNOSIS — D509 Iron deficiency anemia, unspecified: Secondary | ICD-10-CM | POA: Diagnosis not present

## 2017-08-12 DIAGNOSIS — R809 Proteinuria, unspecified: Secondary | ICD-10-CM | POA: Diagnosis not present

## 2017-09-03 DIAGNOSIS — I10 Essential (primary) hypertension: Secondary | ICD-10-CM | POA: Diagnosis not present

## 2017-09-03 DIAGNOSIS — E1121 Type 2 diabetes mellitus with diabetic nephropathy: Secondary | ICD-10-CM | POA: Diagnosis not present

## 2017-09-03 DIAGNOSIS — N183 Chronic kidney disease, stage 3 (moderate): Secondary | ICD-10-CM | POA: Diagnosis not present

## 2017-09-03 DIAGNOSIS — R809 Proteinuria, unspecified: Secondary | ICD-10-CM | POA: Diagnosis not present

## 2017-10-11 DIAGNOSIS — I700 Atherosclerosis of aorta: Secondary | ICD-10-CM | POA: Diagnosis not present

## 2017-10-11 DIAGNOSIS — N184 Chronic kidney disease, stage 4 (severe): Secondary | ICD-10-CM | POA: Diagnosis not present

## 2017-10-11 DIAGNOSIS — Z79899 Other long term (current) drug therapy: Secondary | ICD-10-CM | POA: Diagnosis not present

## 2017-10-11 DIAGNOSIS — E1129 Type 2 diabetes mellitus with other diabetic kidney complication: Secondary | ICD-10-CM | POA: Diagnosis not present

## 2017-10-22 DIAGNOSIS — I129 Hypertensive chronic kidney disease with stage 1 through stage 4 chronic kidney disease, or unspecified chronic kidney disease: Secondary | ICD-10-CM | POA: Diagnosis not present

## 2017-10-22 DIAGNOSIS — E1122 Type 2 diabetes mellitus with diabetic chronic kidney disease: Secondary | ICD-10-CM | POA: Diagnosis not present

## 2017-10-22 DIAGNOSIS — N184 Chronic kidney disease, stage 4 (severe): Secondary | ICD-10-CM | POA: Diagnosis not present

## 2017-10-23 ENCOUNTER — Encounter (INDEPENDENT_AMBULATORY_CARE_PROVIDER_SITE_OTHER): Payer: Self-pay | Admitting: Internal Medicine

## 2017-10-23 ENCOUNTER — Ambulatory Visit (INDEPENDENT_AMBULATORY_CARE_PROVIDER_SITE_OTHER): Payer: Medicare Other | Admitting: Internal Medicine

## 2017-10-23 VITALS — BP 150/82 | HR 68 | Temp 97.7°F | Ht 62.0 in | Wt 188.6 lb

## 2017-10-23 DIAGNOSIS — K219 Gastro-esophageal reflux disease without esophagitis: Secondary | ICD-10-CM | POA: Diagnosis not present

## 2017-10-23 NOTE — Patient Instructions (Signed)
Continue the Protonix. OV in 1 year.  

## 2017-10-23 NOTE — Progress Notes (Signed)
Subjective:    Patient ID: Margaret Huerta, female    DOB: 1945-08-30, 72 y.o.   MRN: [id number]  HPI Here today for f/u. Last seen in June of this year. C/o epigastric pain. She was started on  Dicyclomine BID and Protonix daily. She tells me she is doing good. She just saw Dr Willey Blade yesterday. Her appetite is good. She has lost about 1 1/2 pounds since her last visit. Her BMs are normal.  Sometimes she has pain LUQ but not often. She is drinking less coffee.   She underwent an MRI 06/13/2017 which revealed  IMPRESSION: 1. Exam detail is diminished due to lack of IV contrast material. 2. Multiple left kidney lesions of varying signal intensity are  identified and correspond to the kidney lesions identified on 05/30/2017. Based on unenhanced technique lesions may represent a combination of hemorrhagic, proteinaceous and simple cysts. No definite solid kidney lesions identified. Given the mild complexity of these lesions a follow-up examination in 6-12 months with repeat MRI is advised. At this time the study of choice would be a contrast enhanced MRI, which if the patient's GFR is diminished but greater than 30, could be performed using decreased dose of contrast material to accommodate for the impaired renal function. 3. Prior cholecystectomy.  Hx of CKD stage 3.  Hx of rt renal cell cancer/sp nephrectomy.     1013/2016: EGD &Colonoscopy  Indications: Patient is 72 year old Caucasian female with multiple medical problems including long-standing type 2 diabetes mellitus who presents with over 9 month history of nausea and anorexia 40 pound weight loss and diarrhea. She has history of colonic polyp. She had sessile serrated polyp removed 5 years ago.  Impression:  EGD findings: Small gastric pouch with patent gastrojejunostomy. Short afferent limb of  Roux-en-Y anastomosis. Normal jejunal mucosa for up to 30 cm. Random biopsies taken from small bowel mucosa distal to gastrojejunostomy.  Colonoscopy findings: Examination performed to cecum. 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 No evidence of recurrent polyps or endoscopic colitis. Random biopsies taken from mucosa of sigmoid colon. Small internal and external hemorrhoids.    Review of Systems Past Medical History:  Diagnosis Date  . Anemia   . Anxiety   . Cervical cancer ([place])   . Chronic bronchitis ([place])    "get it q yr"  . Chronic lower back pain   . Depression   . Febrile seizure ([place])    "as a child"  . Fibromyalgia   . GERD (gastroesophageal reflux disease)   . Gout   . History of blood transfusion    "S/P tonsillectomy"  . History of hiatal hernia   . Hx of cardiovascular stress test April 2016   false positive Myoview  . Hyperlipidemia   . Hypertension   . Migraine    "monthly" (05/26/2014)  . Neuropathy   . Osteoporosis   . Parathyroid disease ([place])    "my PHT levels run high; I can't take calcium"  . Pneumonia "several times"  . Renal cancer ([place])    "right"-s/p nephrectomy  . Renal insufficiency    "left kidney works at 40-60%" (05/26/2014)  . Sleep apnea    "wore mask for 2 months; could not take it" (05/26/2014)  . Type II diabetes mellitus ([place])   . Typhus fever    "as a child"    Past Surgical History:  Procedure Laterality Date  . ABDOMINAL HERNIA REPAIR  02/2013   "in [place]"  . ABDOMINAL HYSTERECTOMY  1977   "  partial"  . APPENDECTOMY    . BACK SURGERY    . BILATERAL SALPINGOOPHORECTOMY Bilateral ~ 1993  . CARDIAC CATHETERIZATION  2003, April 2016   normal coronaries  . CATARACT EXTRACTION W/ INTRAOCULAR LENS  IMPLANT, BILATERAL  2015  . COLONOSCOPY N/A 11/25/2014   Procedure: COLONOSCOPY;  Surgeon: Rogene Houston, MD;  [facility];  Service: Endoscopy;  Laterality: N/A;  . CYSTOSCOPY W/ [place]  MANIPULATION  1988  . DIAGNOSTIC LAPAROSCOPY  1975  . DILATION AND CURETTAGE OF UTERUS    . ESOPHAGOGASTRODUODENOSCOPY N/A 11/25/2014   Procedure: ESOPHAGOGASTRODUODENOSCOPY (EGD);  Surgeon: Rogene Houston, MD;  [facility];  Service: Endoscopy;  Laterality: N/A;  9:30 - moved to 11:25 - [name] to notify pt  . ESOPHAGOGASTRODUODENOSCOPY (EGD) WITH ESOPHAGEAL DILATION  1997  . EYE SURGERY    . GASTRIC BYPASS  2012  . HERNIA REPAIR     2015  . HIATAL HERNIA REPAIR  1996  . INCONTINENCE SURGERY  1983  . KNEE ARTHROSCOPY Left 2000's  . LAPAROSCOPIC CHOLECYSTECTOMY  1990's  . LEFT HEART CATHETERIZATION WITH CORONARY ANGIOGRAM N/A 05/27/2014   Procedure: LEFT HEART CATHETERIZATION WITH CORONARY ANGIOGRAM;  Surgeon: Lorretta Harp, MD;  Location: [facility]ATH LAB;  Service: Cardiovascular;  Laterality: N/A;  . LUMBAR [place] SURGERY  2001   "herniated discs"  . [place]   "drilled into gum and put tooth implants uppers"  . NEPHRECTOMY Right 2002   "cancer"  . TMJ ARTHROPLASTY  1988  . TONSILLECTOMY  1976  . UPPER GI ENDOSCOPY      Allergies  Allergen Reactions  . Demerol [Meperidine] Shortness Of Breath    Stop breathing  . Hydrocodone-Acetaminophen Shortness Of Breath    REACTION: stop breathing  . Meperidine Hcl Shortness Of Breath    REACTION: stop breathing  . Vicodin [Hydrocodone-Acetaminophen] Shortness Of Breath    Stop breathing  . Ace Inhibitors Cough  . Codeine     REACTION: hallucination, confused  . Contrast Media [Iodinated Diagnostic Agents]     MRI dye. Shaky, nausea  . Fentanyl Itching  . Hydromorphone Hcl     REACTION: itching, rash, nausea, vomiting  . Morphine     REACTION: vomiting  . Naproxen     REACTION: stomach lesion  . Dilaudid [Hydromorphone Hcl] Itching, Nausea And Vomiting and Rash    Current Outpatient Medications on File Prior to Visit  Medication Sig Dispense Refill  . acetaminophen (TYLENOL) 325 MG tablet Take 2 tablets  (650 mg total) by mouth every 4 (four) hours as needed for headache or mild pain.    Marland Kitchen allopurinol (ZYLOPRIM) 300 MG tablet Take 300 mg by mouth daily.   12  . atenolol (TENORMIN) 50 MG tablet TAKE ONE TABLET BY MOUTH ONCE DAILY (Patient taking differently: TAKE ONE TABLET BY MOUTH twice a day) 30 tablet 7  . cholecalciferol (VITAMIN D) 1000 UNITS tablet Take 5,000 Units by mouth daily.     . Cyanocobalamin (VITAMIN B 12 PO) Take 1 tablet by mouth daily.    Marland Kitchen dicyclomine (BENTYL) 10 MG capsule Take 1 capsule (10 mg total) by mouth 2 (two) times daily before a meal. 90 capsule 3  . diphenhydrAMINE (BENADRYL) 25 mg capsule Take 25 mg by mouth 2 (two) times daily as needed.    . DULoxetine (CYMBALTA) 60 MG capsule Take 60 mg by mouth daily.      . furosemide (LASIX) 40 MG tablet Take 0.5  tablets (20 mg total) by mouth daily. Take 40 mg in the AM and 20 mg in the PM (Patient taking differently: Take 40 mg by mouth 2 (two) times daily. Take 40 mg in the AM and 20 mg in the PM) 30 tablet   . insulin glargine (LANTUS) 100 UNIT/ML injection Inject 60 Units into the skin every morning.     Marland Kitchen LORazepam (ATIVAN) 0.5 MG tablet Take 1 tablet by mouth 2 (two) times daily as needed for sleep.   3  . NITROSTAT 0.4 MG SL tablet Place 0.4 mg under the tongue as needed.    . pantoprazole (PROTONIX) 40 MG tablet Take 1 tablet (40 mg total) by mouth daily. 90 tablet 3  . rosuvastatin (CRESTOR) 20 MG tablet Take 20 mg by mouth.     . telmisartan (MICARDIS) 80 MG tablet Take 1 tablet by mouth daily.     No current facility-administered medications on file prior to visit.         Objective:   Physical Exam  Blood pressure (!) 150/82, pulse 68, temperature 97.7 F (36.5 C), height 5\' 2"  (1.575 m), weight 188 lb 9.6 oz (85.5 kg). Alert and oriented. Skin warm and dry. Oral mucosa is moist.   . Sclera anicteric, conjunctivae is pink. Thyroid not enlarged. No cervical lymphadenopathy. Lungs clear. Heart regular rate and  rhythm.  Abdomen is soft. Bowel sounds are positive. No hepatomegaly. No abdominal masses felt. No tenderness.  No edema to lower extremities.  .        Assessment & Plan:  GERD. Continue the Protonix. OV in 1 year.

## 2017-12-04 DIAGNOSIS — R809 Proteinuria, unspecified: Secondary | ICD-10-CM | POA: Diagnosis not present

## 2017-12-04 DIAGNOSIS — I10 Essential (primary) hypertension: Secondary | ICD-10-CM | POA: Diagnosis not present

## 2017-12-04 DIAGNOSIS — N183 Chronic kidney disease, stage 3 (moderate): Secondary | ICD-10-CM | POA: Diagnosis not present

## 2017-12-04 DIAGNOSIS — D509 Iron deficiency anemia, unspecified: Secondary | ICD-10-CM | POA: Diagnosis not present

## 2017-12-04 DIAGNOSIS — E559 Vitamin D deficiency, unspecified: Secondary | ICD-10-CM | POA: Diagnosis not present

## 2017-12-04 DIAGNOSIS — Z79899 Other long term (current) drug therapy: Secondary | ICD-10-CM | POA: Diagnosis not present

## 2017-12-10 DIAGNOSIS — D509 Iron deficiency anemia, unspecified: Secondary | ICD-10-CM | POA: Diagnosis not present

## 2017-12-10 DIAGNOSIS — I10 Essential (primary) hypertension: Secondary | ICD-10-CM | POA: Diagnosis not present

## 2017-12-10 DIAGNOSIS — E1129 Type 2 diabetes mellitus with other diabetic kidney complication: Secondary | ICD-10-CM | POA: Diagnosis not present

## 2017-12-10 DIAGNOSIS — N183 Chronic kidney disease, stage 3 (moderate): Secondary | ICD-10-CM | POA: Diagnosis not present

## 2017-12-11 ENCOUNTER — Other Ambulatory Visit (HOSPITAL_COMMUNITY): Payer: Self-pay | Admitting: Internal Medicine

## 2017-12-11 DIAGNOSIS — Z1231 Encounter for screening mammogram for malignant neoplasm of breast: Secondary | ICD-10-CM

## 2017-12-12 ENCOUNTER — Ambulatory Visit (HOSPITAL_COMMUNITY)
Admission: RE | Admit: 2017-12-12 | Discharge: 2017-12-12 | Disposition: A | Discharge status: Home / Self Care | Payer: Medicare Other | Source: Ambulatory Visit | Attending: Diagnostic Radiology | Admitting: Diagnostic Radiology

## 2017-12-12 DIAGNOSIS — Z1231 Encounter for screening mammogram for malignant neoplasm of breast: Secondary | ICD-10-CM | POA: Diagnosis not present

## 2017-12-13 ENCOUNTER — Encounter (HOSPITAL_COMMUNITY)
Admission: RE | Admit: 2017-12-13 | Discharge: 2017-12-13 | Disposition: A | Discharge status: Home / Self Care | Payer: Medicare Other | Source: Ambulatory Visit | Attending: Nephrology | Admitting: Nephrology

## 2017-12-13 ENCOUNTER — Encounter (HOSPITAL_COMMUNITY): Payer: Self-pay

## 2017-12-13 DIAGNOSIS — D509 Iron deficiency anemia, unspecified: Secondary | ICD-10-CM | POA: Diagnosis not present

## 2017-12-13 MED ORDER — SODIUM CHLORIDE 0.9 % IV SOLN
Freq: Once | INTRAVENOUS | Status: AC
  Administered 2017-12-13: 12:00:00 via INTRAVENOUS

## 2017-12-13 MED ORDER — SODIUM CHLORIDE 0.9 % IV SOLN
510.0000 mg | Freq: Once | INTRAVENOUS | Status: AC
  Administered 2017-12-13: 510 mg via INTRAVENOUS
  Filled 2017-12-13: qty 17

## 2017-12-13 NOTE — Discharge Instructions (Signed)

## 2017-12-20 ENCOUNTER — Encounter (HOSPITAL_COMMUNITY)
Admission: RE | Admit: 2017-12-20 | Discharge: 2017-12-20 | Disposition: A | Discharge status: Home / Self Care | Payer: Medicare Other | Source: Ambulatory Visit | Attending: Nephrology | Admitting: Nephrology

## 2017-12-20 DIAGNOSIS — D509 Iron deficiency anemia, unspecified: Secondary | ICD-10-CM | POA: Diagnosis not present

## 2017-12-20 MED ORDER — SODIUM CHLORIDE 0.9 % IV SOLN
Freq: Once | INTRAVENOUS | Status: AC
  Administered 2017-12-20: 250 mL via INTRAVENOUS

## 2017-12-20 MED ORDER — SODIUM CHLORIDE 0.9 % IV SOLN
510.0000 mg | Freq: Once | INTRAVENOUS | Status: AC
  Administered 2017-12-20: 510 mg via INTRAVENOUS
  Filled 2017-12-20: qty 17

## 2017-12-25 ENCOUNTER — Other Ambulatory Visit (HOSPITAL_COMMUNITY): Payer: Self-pay | Admitting: Internal Medicine

## 2017-12-25 DIAGNOSIS — N289 Disorder of kidney and ureter, unspecified: Secondary | ICD-10-CM

## 2018-01-02 ENCOUNTER — Ambulatory Visit (HOSPITAL_COMMUNITY)
Admission: RE | Admit: 2018-01-02 | Discharge: 2018-01-02 | Disposition: A | Discharge status: Home / Self Care | Payer: Medicare Other | Source: Ambulatory Visit | Attending: Internal Medicine | Admitting: Internal Medicine

## 2018-01-02 DIAGNOSIS — N289 Disorder of kidney and ureter, unspecified: Secondary | ICD-10-CM | POA: Insufficient documentation

## 2018-01-02 DIAGNOSIS — N2889 Other specified disorders of kidney and ureter: Secondary | ICD-10-CM | POA: Diagnosis not present

## 2018-01-06 DIAGNOSIS — E119 Type 2 diabetes mellitus without complications: Secondary | ICD-10-CM | POA: Diagnosis not present

## 2018-01-06 DIAGNOSIS — H10413 Chronic giant papillary conjunctivitis, bilateral: Secondary | ICD-10-CM | POA: Diagnosis not present

## 2018-01-06 DIAGNOSIS — Z961 Presence of intraocular lens: Secondary | ICD-10-CM | POA: Diagnosis not present

## 2018-01-06 DIAGNOSIS — H26492 Other secondary cataract, left eye: Secondary | ICD-10-CM | POA: Diagnosis not present

## 2018-01-16 DIAGNOSIS — E1129 Type 2 diabetes mellitus with other diabetic kidney complication: Secondary | ICD-10-CM | POA: Diagnosis not present

## 2018-01-16 DIAGNOSIS — I10 Essential (primary) hypertension: Secondary | ICD-10-CM | POA: Diagnosis not present

## 2018-01-16 DIAGNOSIS — Z79899 Other long term (current) drug therapy: Secondary | ICD-10-CM | POA: Diagnosis not present

## 2018-01-16 DIAGNOSIS — N184 Chronic kidney disease, stage 4 (severe): Secondary | ICD-10-CM | POA: Diagnosis not present

## 2018-01-22 DIAGNOSIS — N184 Chronic kidney disease, stage 4 (severe): Secondary | ICD-10-CM | POA: Diagnosis not present

## 2018-01-22 DIAGNOSIS — E785 Hyperlipidemia, unspecified: Secondary | ICD-10-CM | POA: Diagnosis not present

## 2018-01-22 DIAGNOSIS — E1122 Type 2 diabetes mellitus with diabetic chronic kidney disease: Secondary | ICD-10-CM | POA: Diagnosis not present

## 2018-01-22 DIAGNOSIS — I129 Hypertensive chronic kidney disease with stage 1 through stage 4 chronic kidney disease, or unspecified chronic kidney disease: Secondary | ICD-10-CM | POA: Diagnosis not present

## 2018-02-03 DIAGNOSIS — M25562 Pain in left knee: Secondary | ICD-10-CM | POA: Diagnosis not present

## 2018-02-03 DIAGNOSIS — M1712 Unilateral primary osteoarthritis, left knee: Secondary | ICD-10-CM | POA: Diagnosis not present

## 2018-02-03 DIAGNOSIS — M1711 Unilateral primary osteoarthritis, right knee: Secondary | ICD-10-CM | POA: Diagnosis not present

## 2018-02-13 DIAGNOSIS — M25562 Pain in left knee: Secondary | ICD-10-CM | POA: Diagnosis not present

## 2018-02-18 DIAGNOSIS — M25562 Pain in left knee: Secondary | ICD-10-CM | POA: Diagnosis not present

## 2018-02-18 DIAGNOSIS — E109 Type 1 diabetes mellitus without complications: Secondary | ICD-10-CM | POA: Diagnosis not present

## 2018-02-18 DIAGNOSIS — N183 Chronic kidney disease, stage 3 (moderate): Secondary | ICD-10-CM | POA: Diagnosis not present

## 2018-02-18 DIAGNOSIS — M1712 Unilateral primary osteoarthritis, left knee: Secondary | ICD-10-CM | POA: Diagnosis not present

## 2018-03-19 DIAGNOSIS — E559 Vitamin D deficiency, unspecified: Secondary | ICD-10-CM | POA: Diagnosis not present

## 2018-03-19 DIAGNOSIS — N183 Chronic kidney disease, stage 3 (moderate): Secondary | ICD-10-CM | POA: Diagnosis not present

## 2018-03-19 DIAGNOSIS — D509 Iron deficiency anemia, unspecified: Secondary | ICD-10-CM | POA: Diagnosis not present

## 2018-03-19 DIAGNOSIS — R809 Proteinuria, unspecified: Secondary | ICD-10-CM | POA: Diagnosis not present

## 2018-03-19 DIAGNOSIS — I10 Essential (primary) hypertension: Secondary | ICD-10-CM | POA: Diagnosis not present

## 2018-03-19 DIAGNOSIS — Z79899 Other long term (current) drug therapy: Secondary | ICD-10-CM | POA: Diagnosis not present

## 2018-03-25 ENCOUNTER — Other Ambulatory Visit (HOSPITAL_COMMUNITY): Payer: Self-pay | Admitting: Internal Medicine

## 2018-03-25 ENCOUNTER — Ambulatory Visit (HOSPITAL_COMMUNITY)
Admission: RE | Admit: 2018-03-25 | Discharge: 2018-03-25 | Disposition: A | Discharge status: Home / Self Care | Payer: Medicare Other | Source: Ambulatory Visit | Attending: Internal Medicine | Admitting: Internal Medicine

## 2018-03-25 DIAGNOSIS — N183 Chronic kidney disease, stage 3 (moderate): Secondary | ICD-10-CM | POA: Diagnosis not present

## 2018-03-25 DIAGNOSIS — R801 Persistent proteinuria, unspecified: Secondary | ICD-10-CM | POA: Diagnosis not present

## 2018-03-25 DIAGNOSIS — M79661 Pain in right lower leg: Secondary | ICD-10-CM | POA: Diagnosis not present

## 2018-03-25 DIAGNOSIS — W19XXXA Unspecified fall, initial encounter: Secondary | ICD-10-CM | POA: Diagnosis not present

## 2018-03-25 DIAGNOSIS — I10 Essential (primary) hypertension: Secondary | ICD-10-CM | POA: Diagnosis not present

## 2018-03-25 DIAGNOSIS — S8011XA Contusion of right lower leg, initial encounter: Secondary | ICD-10-CM | POA: Insufficient documentation

## 2018-03-25 DIAGNOSIS — E1121 Type 2 diabetes mellitus with diabetic nephropathy: Secondary | ICD-10-CM | POA: Diagnosis not present

## 2018-04-24 DIAGNOSIS — E785 Hyperlipidemia, unspecified: Secondary | ICD-10-CM | POA: Diagnosis not present

## 2018-04-24 DIAGNOSIS — E1129 Type 2 diabetes mellitus with other diabetic kidney complication: Secondary | ICD-10-CM | POA: Diagnosis not present

## 2018-04-24 DIAGNOSIS — N184 Chronic kidney disease, stage 4 (severe): Secondary | ICD-10-CM | POA: Diagnosis not present

## 2018-04-24 DIAGNOSIS — I10 Essential (primary) hypertension: Secondary | ICD-10-CM | POA: Diagnosis not present

## 2018-05-01 DIAGNOSIS — F3340 Major depressive disorder, recurrent, in remission, unspecified: Secondary | ICD-10-CM | POA: Diagnosis not present

## 2018-05-01 DIAGNOSIS — N184 Chronic kidney disease, stage 4 (severe): Secondary | ICD-10-CM | POA: Diagnosis not present

## 2018-05-01 DIAGNOSIS — E1122 Type 2 diabetes mellitus with diabetic chronic kidney disease: Secondary | ICD-10-CM | POA: Diagnosis not present

## 2018-07-25 DIAGNOSIS — E1129 Type 2 diabetes mellitus with other diabetic kidney complication: Secondary | ICD-10-CM | POA: Diagnosis not present

## 2018-07-25 DIAGNOSIS — I10 Essential (primary) hypertension: Secondary | ICD-10-CM | POA: Diagnosis not present

## 2018-07-25 DIAGNOSIS — R809 Proteinuria, unspecified: Secondary | ICD-10-CM | POA: Diagnosis not present

## 2018-07-25 DIAGNOSIS — N184 Chronic kidney disease, stage 4 (severe): Secondary | ICD-10-CM | POA: Diagnosis not present

## 2018-07-25 DIAGNOSIS — E559 Vitamin D deficiency, unspecified: Secondary | ICD-10-CM | POA: Diagnosis not present

## 2018-07-25 DIAGNOSIS — N183 Chronic kidney disease, stage 3 (moderate): Secondary | ICD-10-CM | POA: Diagnosis not present

## 2018-07-25 DIAGNOSIS — Z79899 Other long term (current) drug therapy: Secondary | ICD-10-CM | POA: Diagnosis not present

## 2018-07-25 DIAGNOSIS — D649 Anemia, unspecified: Secondary | ICD-10-CM | POA: Diagnosis not present

## 2018-08-01 DIAGNOSIS — N184 Chronic kidney disease, stage 4 (severe): Secondary | ICD-10-CM | POA: Diagnosis not present

## 2018-08-01 DIAGNOSIS — E1122 Type 2 diabetes mellitus with diabetic chronic kidney disease: Secondary | ICD-10-CM | POA: Diagnosis not present

## 2018-08-01 DIAGNOSIS — J0100 Acute maxillary sinusitis, unspecified: Secondary | ICD-10-CM | POA: Diagnosis not present

## 2018-09-22 DIAGNOSIS — B029 Zoster without complications: Secondary | ICD-10-CM | POA: Diagnosis not present

## 2018-09-29 DIAGNOSIS — H35359 Cystoid macular degeneration, unspecified eye: Secondary | ICD-10-CM | POA: Diagnosis not present

## 2018-09-29 DIAGNOSIS — E113293 Type 2 diabetes mellitus with mild nonproliferative diabetic retinopathy without macular edema, bilateral: Secondary | ICD-10-CM | POA: Diagnosis not present

## 2018-09-29 DIAGNOSIS — H35073 Retinal telangiectasis, bilateral: Secondary | ICD-10-CM | POA: Diagnosis not present

## 2018-09-29 DIAGNOSIS — H3561 Retinal hemorrhage, right eye: Secondary | ICD-10-CM | POA: Diagnosis not present

## 2018-10-06 DIAGNOSIS — E113312 Type 2 diabetes mellitus with moderate nonproliferative diabetic retinopathy with macular edema, left eye: Secondary | ICD-10-CM | POA: Diagnosis not present

## 2018-10-06 DIAGNOSIS — E113212 Type 2 diabetes mellitus with mild nonproliferative diabetic retinopathy with macular edema, left eye: Secondary | ICD-10-CM | POA: Diagnosis not present

## 2018-10-06 DIAGNOSIS — H35073 Retinal telangiectasis, bilateral: Secondary | ICD-10-CM | POA: Diagnosis not present

## 2018-10-06 DIAGNOSIS — H35042 Retinal micro-aneurysms, unspecified, left eye: Secondary | ICD-10-CM | POA: Diagnosis not present

## 2018-10-06 DIAGNOSIS — H3561 Retinal hemorrhage, right eye: Secondary | ICD-10-CM | POA: Diagnosis not present

## 2018-10-26 NOTE — Progress Notes (Signed)
Subjective:    Patient ID: Margaret Huerta, female    DOB: 1945-08-31, 73 y.o.   MRN: [id number]  HPI Raymond L. Dinunzio is a 73 year old female with a past medical history of anxiety, depression, DM II, hypertension, cervical cancer, renal cancer s/p right nephrectomy 2002, sleep apnea, fibromyalgia, migraine headaches, gout, anemia and GERD. S/P Roux-en Y gastric bypass in 2012. She developed pain across her right upper abdomen approximately 4 weeks ago which was the same area she previously had Shingles. She did not develop a Shingles rash but the right upper abdominal burning pain persisted. She was seen by her PCP, Dr. Willey Blade, who prescribed Gabapentin 100mg  at bed time which was increased to 300mg  at bed time. Her RUQ burning type pain resolved. She also noted that her typical nausea and diarrhea which often occurred after eating also abated after she started taking Gabapentin. She reported having generalized abdominal discomfort, nausea followed by passing a formed stool, subsequent stools a few hours later were soft then watery. She is now passing a normal formed stool 1 to 2 times daily. No rectal bleeding or melena. No GERD symptoms. No other complaints today.   EGD 11/25/2014: Esophagus:  Mucosa of the esophagus was normal. GE junction was unremarkable. GEJ:  39 cm Stomach: Very small gastric pouch. Mucosa of gastric pouch was normal. Scope could not be retroflexed. Jejunum:  Jejunal mucosa was examined for up to 30 cm and was normal. Short afferent limb blind loop with normal mucosa.  Colonoscopy 11/25/2014: Examination performed to cecum. 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 No evidence of recurrent polyps or endoscopic colitis. Random biopsies taken from mucosa of sigmoid colon, no evidence of colitis.  Small internal and external hemorrhoids.  Past Medical History:  Diagnosis Date  . Anemia   . Anxiety   . Cervical cancer ([place])   . Chronic bronchitis ([place])     "get it q yr"  . Chronic lower back pain   . Depression   . Febrile seizure ([place])    "as a child"  . Fibromyalgia   . GERD (gastroesophageal reflux disease)   . Gout   . History of blood transfusion    "S/P tonsillectomy"  . History of hiatal hernia   . Hx of cardiovascular stress test April 2016   false positive Myoview  . Hyperlipidemia   . Hypertension   . Migraine    "monthly" (05/26/2014)  . Neuropathy   . Osteoporosis   . Parathyroid disease ([place])    "my PHT levels run high; I can't take calcium"  . Pneumonia "several times"  . Renal cancer ([place])    "right"-s/p nephrectomy  . Renal insufficiency    "left kidney works at 40-60%" (05/26/2014)  . Sleep apnea    "wore mask for 2 months; could not take it" (05/26/2014)  . Type II diabetes mellitus ([place])   . Typhus fever    "as a child"   Past Surgical History:  Procedure Laterality Date  . ABDOMINAL HERNIA REPAIR  02/2013   "in [place]"  . ABDOMINAL HYSTERECTOMY  1977   "partial"  . APPENDECTOMY    . BACK SURGERY    . BILATERAL SALPINGOOPHORECTOMY Bilateral ~ 1993  . CARDIAC CATHETERIZATION  2003, April 2016   normal coronaries  . CATARACT EXTRACTION W/ INTRAOCULAR LENS  IMPLANT, BILATERAL  2015  . COLONOSCOPY N/A 11/25/2014   Procedure: COLONOSCOPY;  Surgeon: Rogene Houston, MD;  [facility];  Service: Endoscopy;  Laterality: N/A;  . CYSTOSCOPY W/ [place] MANIPULATION  1988  . DIAGNOSTIC LAPAROSCOPY  1975  . DILATION AND CURETTAGE OF UTERUS    . ESOPHAGOGASTRODUODENOSCOPY N/A 11/25/2014   Procedure: ESOPHAGOGASTRODUODENOSCOPY (EGD);  Surgeon: Rogene Houston, MD;  [facility];  Service: Endoscopy;  Laterality: N/A;  9:30 - moved to 11:25 - [name] to notify pt  . ESOPHAGOGASTRODUODENOSCOPY (EGD) WITH ESOPHAGEAL DILATION  1997  . EYE SURGERY    . GASTRIC BYPASS  2012  . HERNIA REPAIR     2015  . HIATAL HERNIA REPAIR  1996  . INCONTINENCE SURGERY  1983  . KNEE ARTHROSCOPY Left 2000's  .  LAPAROSCOPIC CHOLECYSTECTOMY  1990's  . LEFT HEART CATHETERIZATION WITH CORONARY ANGIOGRAM N/A 05/27/2014   Procedure: LEFT HEART CATHETERIZATION WITH CORONARY ANGIOGRAM;  Surgeon: Lorretta Harp, MD;  Location: [facility] CATH LAB;  Service: Cardiovascular;  Laterality: N/A;  . LUMBAR [place] SURGERY  2001   "herniated discs"  . [place]   "drilled into gum and put tooth implants uppers"  . NEPHRECTOMY Right 2002   "cancer"  . TMJ ARTHROPLASTY  1988  . TONSILLECTOMY  1976  . UPPER GI ENDOSCOPY        Objective:   Physical Exam  BP (!) 149/82   Pulse 63   Temp 97.9 F (36.6 C) (Oral)   Resp 18   Ht 5\' 2"  (1.575 m)   Wt 200 lb 11.2 oz (91 kg)   BMI 36.71 kg/m  General: 73 year old female well-developed in no acute distress Eyes: Sclera nonicteric, conjunctiva pink Mouth: Few missing teeth, no ulcers or lesions Neck: Supple, no lymphadenopathy Heart: Regular rate and rhythm, 1/6 systolic murmur Lungs: Breath sounds clear throughout Abdomen: Soft, nontender, no masses or organomegaly, extensive midline abdominal scar intact, large right upper quadrant scar intact. Extremities: No edema Neuro: Alert and oriented x4, no focal deficits    Assessment & Plan:   1. Nausea, currently resolved after gabapentin started for neuropathic pain from prior shingles to the right upper abdomen area -Patient to call office if her nausea recurs -Discussed scheduling a gastric emptying study if nausea recurs  2.  Irritable bowel symptoms, currently resolved -And to call office if symptoms recur  3.  Colon cancer screening. Last colonoscopy was 01/2015, no polyps -Dr. Laural Golden to verify colonoscopy recall   4. HX of DM II   5. HTN. BP elevated  -patient has appointment next with PCP, she will have BP rechecked at that time   6. Normocytic Anemia, most likely due to renal cancer s/p right nephrectomy with CKD to left kidney. Labs 05/30/2017: Hg 10.9. HCT 37.2. MCB 80.3.  -I will request a  copy of the patient's most recent CBC from PCP's office, if current labs show anemia, patient should have iron studies and Vitamin B12 levels.  If iron deficient to consider repeat EGD/colonoscopy  7. Remote history of GERD, asymptomatic, patient does not take acid reducing medications since her gastric bypass in 2012

## 2018-10-27 ENCOUNTER — Telehealth (INDEPENDENT_AMBULATORY_CARE_PROVIDER_SITE_OTHER): Payer: Self-pay | Admitting: Nurse Practitioner

## 2018-10-27 ENCOUNTER — Encounter (INDEPENDENT_AMBULATORY_CARE_PROVIDER_SITE_OTHER): Payer: Self-pay | Admitting: Nurse Practitioner

## 2018-10-27 ENCOUNTER — Ambulatory Visit (INDEPENDENT_AMBULATORY_CARE_PROVIDER_SITE_OTHER): Payer: Medicare Other | Admitting: Nurse Practitioner

## 2018-10-27 ENCOUNTER — Other Ambulatory Visit: Payer: Self-pay

## 2018-10-27 DIAGNOSIS — Q898 Other specified congenital malformations: Secondary | ICD-10-CM | POA: Diagnosis not present

## 2018-10-27 DIAGNOSIS — R112 Nausea with vomiting, unspecified: Secondary | ICD-10-CM | POA: Insufficient documentation

## 2018-10-27 NOTE — Patient Instructions (Signed)
1. Your symptoms of nausea and irritable bowel have resolved, please call our office if your symptoms recur  2. Follow up in the office in 1 year and as needed

## 2018-10-27 NOTE — Telephone Encounter (Signed)
Mitzie, pls contact PCP Dr. Willey Blade and nephrologist Dr. Lowanda Foster offices for copy of most recent CBC, CMP and iron studies. Patient reported having blood done by both physicians. The most recent labs in [place] were 05/2017. thx

## 2018-10-28 ENCOUNTER — Other Ambulatory Visit (INDEPENDENT_AMBULATORY_CARE_PROVIDER_SITE_OTHER): Payer: Self-pay | Admitting: Nurse Practitioner

## 2018-10-28 DIAGNOSIS — D649 Anemia, unspecified: Secondary | ICD-10-CM

## 2018-10-29 DIAGNOSIS — E559 Vitamin D deficiency, unspecified: Secondary | ICD-10-CM | POA: Diagnosis not present

## 2018-10-29 DIAGNOSIS — I10 Essential (primary) hypertension: Secondary | ICD-10-CM | POA: Diagnosis not present

## 2018-10-29 DIAGNOSIS — E1129 Type 2 diabetes mellitus with other diabetic kidney complication: Secondary | ICD-10-CM | POA: Diagnosis not present

## 2018-10-29 DIAGNOSIS — N184 Chronic kidney disease, stage 4 (severe): Secondary | ICD-10-CM | POA: Diagnosis not present

## 2018-10-29 DIAGNOSIS — R809 Proteinuria, unspecified: Secondary | ICD-10-CM | POA: Diagnosis not present

## 2018-10-29 DIAGNOSIS — Z79899 Other long term (current) drug therapy: Secondary | ICD-10-CM | POA: Diagnosis not present

## 2018-10-29 DIAGNOSIS — N183 Chronic kidney disease, stage 3 (moderate): Secondary | ICD-10-CM | POA: Diagnosis not present

## 2018-10-29 DIAGNOSIS — D649 Anemia, unspecified: Secondary | ICD-10-CM | POA: Diagnosis not present

## 2018-11-02 ENCOUNTER — Telehealth (INDEPENDENT_AMBULATORY_CARE_PROVIDER_SITE_OTHER): Payer: Self-pay | Admitting: Nurse Practitioner

## 2018-11-02 NOTE — Telephone Encounter (Signed)
[name], per Dr. Janee Morn,  pls enter colonoscopy recall 01/2025.

## 2018-11-03 NOTE — Telephone Encounter (Signed)
Noted in recall 

## 2018-11-04 DIAGNOSIS — M5410 Radiculopathy, site unspecified: Secondary | ICD-10-CM | POA: Diagnosis not present

## 2018-11-04 DIAGNOSIS — N184 Chronic kidney disease, stage 4 (severe): Secondary | ICD-10-CM | POA: Diagnosis not present

## 2018-11-04 DIAGNOSIS — N281 Cyst of kidney, acquired: Secondary | ICD-10-CM | POA: Diagnosis not present

## 2018-11-04 DIAGNOSIS — E1122 Type 2 diabetes mellitus with diabetic chronic kidney disease: Secondary | ICD-10-CM | POA: Diagnosis not present

## 2018-11-06 DIAGNOSIS — D649 Anemia, unspecified: Secondary | ICD-10-CM | POA: Diagnosis not present

## 2018-11-06 LAB — CBC WITH DIFFERENTIAL/PLATELET
Absolute Monocytes: 590 cells/uL (ref 200–950)
Basophils Absolute: 50 cells/uL (ref 0–200)
Basophils Relative: 0.7 %
Eosinophils Absolute: 338 cells/uL (ref 15–500)
Eosinophils Relative: 4.7 %
HCT: 40.6 % (ref 35.0–45.0)
Hemoglobin: 13.1 g/dL (ref 11.7–15.5)
Lymphs Abs: 1699 cells/uL (ref 850–3900)
MCH: 28.6 pg (ref 27.0–33.0)
MCHC: 32.3 g/dL (ref 32.0–36.0)
MCV: 88.6 fL (ref 80.0–100.0)
MPV: 11.5 fL (ref 7.5–12.5)
Monocytes Relative: 8.2 %
Neutro Abs: 4522 cells/uL (ref 1500–7800)
Neutrophils Relative %: 62.8 %
Platelets: 215 10*3/uL (ref 140–400)
RBC: 4.58 10*6/uL (ref 3.80–5.10)
RDW: 15.1 % — ABNORMAL HIGH (ref 11.0–15.0)
Total Lymphocyte: 23.6 %
WBC: 7.2 10*3/uL (ref 3.8–10.8)

## 2018-11-06 LAB — IRON,TIBC AND FERRITIN PANEL
%SAT: 29 % (calc) (ref 16–45)
Ferritin: 53 ng/mL (ref 16–288)
Iron: 78 ug/dL (ref 45–160)
TIBC: 269 mcg/dL (calc) (ref 250–450)

## 2018-11-10 ENCOUNTER — Other Ambulatory Visit (HOSPITAL_COMMUNITY): Payer: Self-pay | Admitting: Internal Medicine

## 2018-11-10 DIAGNOSIS — Z1231 Encounter for screening mammogram for malignant neoplasm of breast: Secondary | ICD-10-CM

## 2018-11-17 DIAGNOSIS — E113312 Type 2 diabetes mellitus with moderate nonproliferative diabetic retinopathy with macular edema, left eye: Secondary | ICD-10-CM | POA: Diagnosis not present

## 2018-11-19 DIAGNOSIS — N189 Chronic kidney disease, unspecified: Secondary | ICD-10-CM | POA: Diagnosis not present

## 2018-11-19 DIAGNOSIS — I129 Hypertensive chronic kidney disease with stage 1 through stage 4 chronic kidney disease, or unspecified chronic kidney disease: Secondary | ICD-10-CM | POA: Diagnosis not present

## 2018-11-19 DIAGNOSIS — E1129 Type 2 diabetes mellitus with other diabetic kidney complication: Secondary | ICD-10-CM | POA: Diagnosis not present

## 2018-11-19 DIAGNOSIS — E559 Vitamin D deficiency, unspecified: Secondary | ICD-10-CM | POA: Diagnosis not present

## 2018-11-19 DIAGNOSIS — E211 Secondary hyperparathyroidism, not elsewhere classified: Secondary | ICD-10-CM | POA: Diagnosis not present

## 2018-11-19 DIAGNOSIS — E1122 Type 2 diabetes mellitus with diabetic chronic kidney disease: Secondary | ICD-10-CM | POA: Diagnosis not present

## 2018-11-19 DIAGNOSIS — R809 Proteinuria, unspecified: Secondary | ICD-10-CM | POA: Diagnosis not present

## 2018-11-19 DIAGNOSIS — E870 Hyperosmolality and hypernatremia: Secondary | ICD-10-CM | POA: Diagnosis not present

## 2018-12-03 DIAGNOSIS — E1129 Type 2 diabetes mellitus with other diabetic kidney complication: Secondary | ICD-10-CM | POA: Diagnosis not present

## 2018-12-03 DIAGNOSIS — N189 Chronic kidney disease, unspecified: Secondary | ICD-10-CM | POA: Diagnosis not present

## 2018-12-03 DIAGNOSIS — E870 Hyperosmolality and hypernatremia: Secondary | ICD-10-CM | POA: Diagnosis not present

## 2018-12-03 DIAGNOSIS — R809 Proteinuria, unspecified: Secondary | ICD-10-CM | POA: Diagnosis not present

## 2018-12-03 DIAGNOSIS — E1122 Type 2 diabetes mellitus with diabetic chronic kidney disease: Secondary | ICD-10-CM | POA: Diagnosis not present

## 2018-12-03 DIAGNOSIS — I129 Hypertensive chronic kidney disease with stage 1 through stage 4 chronic kidney disease, or unspecified chronic kidney disease: Secondary | ICD-10-CM | POA: Diagnosis not present

## 2018-12-08 DIAGNOSIS — Z23 Encounter for immunization: Secondary | ICD-10-CM | POA: Diagnosis not present

## 2018-12-15 ENCOUNTER — Other Ambulatory Visit: Payer: Self-pay

## 2018-12-15 ENCOUNTER — Ambulatory Visit (HOSPITAL_COMMUNITY)
Admission: RE | Admit: 2018-12-15 | Discharge: 2018-12-15 | Disposition: A | Discharge status: Home / Self Care | Payer: Medicare Other | Source: Ambulatory Visit | Attending: Internal Medicine | Admitting: Internal Medicine

## 2018-12-15 DIAGNOSIS — Z1231 Encounter for screening mammogram for malignant neoplasm of breast: Secondary | ICD-10-CM | POA: Diagnosis not present

## 2018-12-22 DIAGNOSIS — H35372 Puckering of macula, left eye: Secondary | ICD-10-CM | POA: Diagnosis not present

## 2018-12-22 DIAGNOSIS — E113312 Type 2 diabetes mellitus with moderate nonproliferative diabetic retinopathy with macular edema, left eye: Secondary | ICD-10-CM | POA: Diagnosis not present

## 2018-12-22 DIAGNOSIS — H3561 Retinal hemorrhage, right eye: Secondary | ICD-10-CM | POA: Diagnosis not present

## 2018-12-22 DIAGNOSIS — E113391 Type 2 diabetes mellitus with moderate nonproliferative diabetic retinopathy without macular edema, right eye: Secondary | ICD-10-CM | POA: Diagnosis not present

## 2018-12-30 ENCOUNTER — Other Ambulatory Visit (HOSPITAL_COMMUNITY): Payer: Self-pay | Admitting: Internal Medicine

## 2018-12-30 ENCOUNTER — Other Ambulatory Visit: Payer: Self-pay | Admitting: Internal Medicine

## 2018-12-30 DIAGNOSIS — N289 Disorder of kidney and ureter, unspecified: Secondary | ICD-10-CM

## 2019-01-06 ENCOUNTER — Ambulatory Visit (HOSPITAL_COMMUNITY)
Admission: RE | Admit: 2019-01-06 | Discharge: 2019-01-06 | Disposition: A | Discharge status: Home / Self Care | Payer: Medicare Other | Source: Ambulatory Visit | Attending: Internal Medicine | Admitting: Internal Medicine

## 2019-01-06 ENCOUNTER — Other Ambulatory Visit: Payer: Self-pay

## 2019-01-06 DIAGNOSIS — N289 Disorder of kidney and ureter, unspecified: Secondary | ICD-10-CM | POA: Diagnosis not present

## 2019-01-06 DIAGNOSIS — N2889 Other specified disorders of kidney and ureter: Secondary | ICD-10-CM | POA: Diagnosis not present

## 2019-01-13 DIAGNOSIS — H10413 Chronic giant papillary conjunctivitis, bilateral: Secondary | ICD-10-CM | POA: Diagnosis not present

## 2019-01-13 DIAGNOSIS — E113291 Type 2 diabetes mellitus with mild nonproliferative diabetic retinopathy without macular edema, right eye: Secondary | ICD-10-CM | POA: Diagnosis not present

## 2019-01-13 DIAGNOSIS — Z961 Presence of intraocular lens: Secondary | ICD-10-CM | POA: Diagnosis not present

## 2019-01-13 DIAGNOSIS — H26491 Other secondary cataract, right eye: Secondary | ICD-10-CM | POA: Diagnosis not present

## 2019-01-21 DIAGNOSIS — N184 Chronic kidney disease, stage 4 (severe): Secondary | ICD-10-CM | POA: Diagnosis not present

## 2019-01-21 DIAGNOSIS — Z79899 Other long term (current) drug therapy: Secondary | ICD-10-CM | POA: Diagnosis not present

## 2019-01-21 DIAGNOSIS — E559 Vitamin D deficiency, unspecified: Secondary | ICD-10-CM | POA: Diagnosis not present

## 2019-01-21 DIAGNOSIS — R809 Proteinuria, unspecified: Secondary | ICD-10-CM | POA: Diagnosis not present

## 2019-01-21 DIAGNOSIS — D631 Anemia in chronic kidney disease: Secondary | ICD-10-CM | POA: Diagnosis not present

## 2019-01-28 DIAGNOSIS — E870 Hyperosmolality and hypernatremia: Secondary | ICD-10-CM | POA: Diagnosis not present

## 2019-01-28 DIAGNOSIS — E211 Secondary hyperparathyroidism, not elsewhere classified: Secondary | ICD-10-CM | POA: Diagnosis not present

## 2019-01-28 DIAGNOSIS — E1129 Type 2 diabetes mellitus with other diabetic kidney complication: Secondary | ICD-10-CM | POA: Diagnosis not present

## 2019-01-28 DIAGNOSIS — N189 Chronic kidney disease, unspecified: Secondary | ICD-10-CM | POA: Diagnosis not present

## 2019-01-28 DIAGNOSIS — E559 Vitamin D deficiency, unspecified: Secondary | ICD-10-CM | POA: Diagnosis not present

## 2019-01-28 DIAGNOSIS — E1122 Type 2 diabetes mellitus with diabetic chronic kidney disease: Secondary | ICD-10-CM | POA: Diagnosis not present

## 2019-01-28 DIAGNOSIS — R809 Proteinuria, unspecified: Secondary | ICD-10-CM | POA: Diagnosis not present

## 2019-02-19 DIAGNOSIS — N189 Chronic kidney disease, unspecified: Secondary | ICD-10-CM | POA: Diagnosis not present

## 2019-02-19 DIAGNOSIS — E870 Hyperosmolality and hypernatremia: Secondary | ICD-10-CM | POA: Diagnosis not present

## 2019-02-19 DIAGNOSIS — E211 Secondary hyperparathyroidism, not elsewhere classified: Secondary | ICD-10-CM | POA: Diagnosis not present

## 2019-02-19 DIAGNOSIS — R809 Proteinuria, unspecified: Secondary | ICD-10-CM | POA: Diagnosis not present

## 2019-02-19 DIAGNOSIS — E1129 Type 2 diabetes mellitus with other diabetic kidney complication: Secondary | ICD-10-CM | POA: Diagnosis not present

## 2019-02-19 DIAGNOSIS — E1122 Type 2 diabetes mellitus with diabetic chronic kidney disease: Secondary | ICD-10-CM | POA: Diagnosis not present

## 2019-02-25 DIAGNOSIS — E211 Secondary hyperparathyroidism, not elsewhere classified: Secondary | ICD-10-CM | POA: Diagnosis not present

## 2019-02-25 DIAGNOSIS — N189 Chronic kidney disease, unspecified: Secondary | ICD-10-CM | POA: Diagnosis not present

## 2019-02-25 DIAGNOSIS — E870 Hyperosmolality and hypernatremia: Secondary | ICD-10-CM | POA: Diagnosis not present

## 2019-02-25 DIAGNOSIS — E559 Vitamin D deficiency, unspecified: Secondary | ICD-10-CM | POA: Diagnosis not present

## 2019-02-25 DIAGNOSIS — R809 Proteinuria, unspecified: Secondary | ICD-10-CM | POA: Diagnosis not present

## 2019-02-27 ENCOUNTER — Encounter: Payer: Self-pay | Admitting: Internal Medicine

## 2019-02-27 ENCOUNTER — Other Ambulatory Visit: Payer: Self-pay

## 2019-02-27 ENCOUNTER — Ambulatory Visit: Payer: Medicare Other | Admitting: Internal Medicine

## 2019-02-27 ENCOUNTER — Encounter: Payer: Self-pay | Admitting: *Deleted

## 2019-02-27 VITALS — BP 135/75 | HR 64 | Temp 98.6°F | Ht 62.5 in | Wt 201.0 lb

## 2019-02-27 DIAGNOSIS — I10 Essential (primary) hypertension: Secondary | ICD-10-CM

## 2019-02-27 DIAGNOSIS — E782 Mixed hyperlipidemia: Secondary | ICD-10-CM

## 2019-02-27 NOTE — Patient Instructions (Signed)
Medication Instructions:  Your physician recommends that you continue on your current medications as directed. Please refer to the Current Medication list given to you today.  *If you need a refill on your cardiac medications before your next appointment, please call your pharmacy*  Lab Work: NONE  If you have labs (blood work) drawn today and your tests are completely normal, you will receive your results only by: Marland Kitchen MyChart Message (if you have MyChart) OR . A paper copy in the mail If you have any lab test that is abnormal or we need to change your treatment, we will call you to review the results.  Testing/Procedures: NONE   Follow-Up: At [facility], you and your health needs are our priority.  As part of our continuing mission to provide you with exceptional heart care, we have created designated Provider Care Teams.  These Care Teams include your primary Cardiologist (physician) and Advanced Practice Providers (APPs -  Physician Assistants and Nurse Practitioners) who all work together to provide you with the care you need, when you need it.  Your next appointment:    To Be Determined   The format for your next appointment:   In Person  Provider:   [name], MD  Other Instructions Thank you for choosing [facility]!

## 2019-02-27 NOTE — Progress Notes (Signed)
Cardiology Office Note   Date:  02/27/2019   ID:  Margaret Huerta, DOB [birth year], MRN [id number]  PCP:  [name], MD  Cardiologist:   [name], MD       History of Present Illness: Margaret Huerta is a 74 y.o. female with a history of hypertension, hyperlipidemia, type 2 diabetes, sleep apnea (noncompliant with CPAP) and family history of CAD.  She is also status post nephrectomy (right) for renal cell CA.  With some insufficiency of renal function.  Patient had left heart cath in 2003 (a J.  Einar Gip) and in 2016.  For abnormal stress test.  This both showed normal coronary arteries  The patient has also been seen by Crissie Sickles for syncope.  Thought related to orthostasis pos                            sibly meds adjusted.  She was last seen in cardiology clinic by Donnella Bi in 2017.  Presents today to reestablish  The patient says she has been doing okay.  Denies chest pain no shortness of breath.  She does note over the past 6 months her blood pressure became elevated she was seen in the renal clinic and her medications adjusted now systolic blood pressures 638V to 140s.  Patient complains of a lot of cramping in her toes.  No sores in the feet.  Current Meds  Medication Sig  . acetaminophen (TYLENOL) 325 MG tablet Take 2 tablets (650 mg total) by mouth every 4 (four) hours as needed for headache or mild pain.  Marland Kitchen allopurinol (ZYLOPRIM) 300 MG tablet Take 300 mg by mouth daily.   Marland Kitchen atenolol (TENORMIN) 50 MG tablet TAKE ONE TABLET BY MOUTH ONCE DAILY (Patient taking differently: TAKE ONE TABLET BY MOUTH twice a day)  . docusate sodium (COLACE) 100 MG capsule Take 100 mg by mouth daily.   . DULoxetine (CYMBALTA) 60 MG capsule Take 60 mg by mouth daily.    . furosemide (LASIX) 40 MG tablet Take 0.5 tablets (20 mg total) by mouth daily. Take 40 mg in the AM and 20 mg in the PM (Patient taking differently: Take 40 mg by mouth 2 (two) times daily. Take 40  mg in the AM and 20 mg in the PM)  . insulin glargine (LANTUS) 100 UNIT/ML injection Inject 60 Units into the skin every morning.   Marland Kitchen NITROSTAT 0.4 MG SL tablet Place 0.4 mg under the tongue as needed.  . rosuvastatin (CRESTOR) 20 MG tablet Take 20 mg by mouth.   . telmisartan (MICARDIS) 80 MG tablet Take 1 tablet by mouth daily.     Allergies:   Demerol [meperidine], Hydrocodone-acetaminophen, Meperidine hcl, Vicodin [hydrocodone-acetaminophen], Ace inhibitors, Codeine, Contrast media [iodinated diagnostic agents], Fentanyl, Hydromorphone hcl, Morphine, Naproxen, and Dilaudid [hydromorphone hcl]   Past Medical History:  Diagnosis Date  . Anemia   . Anxiety   . Cervical cancer ([place])   . Chronic bronchitis ([place])    "get it q yr"  . Chronic lower back pain   . Depression   . Febrile seizure ([place])    "as a child"  . Fibromyalgia   . GERD (gastroesophageal reflux disease)   . Gout   . History of blood transfusion    "S/P tonsillectomy"  . History of hiatal hernia   . Hx of cardiovascular stress test April 2016   false positive Myoview  . Hyperlipidemia   .  Hypertension   . Migraine    "monthly" (05/26/2014)  . Neuropathy   . Osteoporosis   . Parathyroid disease (Margaret Huerta)    "my PHT levels run high; I can't take calcium"  . Pneumonia "several times"  . Renal cancer (Margaret Huerta)    "right"-s/p nephrectomy  . Renal insufficiency    "left kidney works at 40-60%" (05/26/2014)  . Sleep apnea    "wore mask for 2 months; could not take it" (05/26/2014)  . Type II diabetes mellitus ([place])   . Typhus fever    "as a child"    Past Surgical History:  Procedure Laterality Date  . ABDOMINAL HERNIA REPAIR  02/2013   "in [place]"  . ABDOMINAL HYSTERECTOMY  1977   "partial"  . APPENDECTOMY    . BACK SURGERY    . BILATERAL SALPINGOOPHORECTOMY Bilateral ~ 1993  . CARDIAC CATHETERIZATION  2003, April 2016   normal coronaries  . CATARACT EXTRACTION W/ INTRAOCULAR LENS  IMPLANT, BILATERAL  2015    . COLONOSCOPY N/A 11/25/2014   Procedure: COLONOSCOPY;  Surgeon: Rogene Houston, MD;  [facility];  Service: Endoscopy;  Laterality: N/A;  . CYSTOSCOPY W/ [place] MANIPULATION  1988  . DIAGNOSTIC LAPAROSCOPY  1975  . DILATION AND CURETTAGE OF UTERUS    . ESOPHAGOGASTRODUODENOSCOPY N/A 11/25/2014   Procedure: ESOPHAGOGASTRODUODENOSCOPY (EGD);  Surgeon: Rogene Houston, MD;  [facility];  Service: Endoscopy;  Laterality: N/A;  9:30 - moved to 11:25 - [name] to notify pt  . ESOPHAGOGASTRODUODENOSCOPY (EGD) WITH ESOPHAGEAL DILATION  1997  . EYE SURGERY    . GASTRIC BYPASS  2012  . HERNIA REPAIR     2015  . HIATAL HERNIA REPAIR  1996  . INCONTINENCE SURGERY  1983  . KNEE ARTHROSCOPY Left 2000's  . LAPAROSCOPIC CHOLECYSTECTOMY  1990's  . LEFT HEART CATHETERIZATION WITH CORONARY ANGIOGRAM N/A 05/27/2014   Procedure: LEFT HEART CATHETERIZATION WITH CORONARY ANGIOGRAM;  Surgeon: Lorretta Harp, MD;  Location: [facility];  Service: Cardiovascular;  Laterality: N/A;  . LUMBAR [place] SURGERY  2001   "herniated discs"  . [place]   "drilled into gum and put tooth implants uppers"  . NEPHRECTOMY Right 2002   "cancer"  . TMJ ARTHROPLASTY  1988  . TONSILLECTOMY  1976  . UPPER GI ENDOSCOPY       Social History:  The patient  reports that she has never smoked. She has never used smokeless tobacco. She reports that she does not drink alcohol or use drugs.   Family History:  The patient's family history includes Alcohol abuse in her maternal uncle; Allergies in an other family member; Clotting disorder in her mother; Diabetes in her mother; Emphysema in her father; Heart disease in her father; Kidney disease in her mother; Lung cancer in her maternal uncle.    ROS:  Please see the history of present illness. All other systems are reviewed and  Negative to the above problem except as noted.    PHYSICAL EXAM: VS:  BP 135/75   Pulse 64   Temp 98.6 F (37 C)   Ht  5' 2.5" (1.588 m)   Wt 201 lb (91.2 kg)   SpO2 97%   BMI 36.18 kg/m   GEN: Morbidly obese 74 year old in no acute distress  HEENT: normal  Neck: no JVD, carotid bruits, Cardiac: RRR; no murmurs, rubs, or gallops,no edema  Respiratory:  clear to auscultation bilaterally, normal work of breathing GI: soft, nontender, nondistended, +  BS  No hepatomegaly  MS: no deformity Moving all extremities   Skin: warm and dry, no rash Neuro:  Strength and sensation are intact Psych: euthymic mood, full affect   EKG:  EKG is not ordered today.   Lipid Panel No results found for: CHOL, TRIG, HDL, CHOLHDL, VLDL, LDLCALC, LDLDIRECT    Wt Readings from Last 3 Encounters:  02/27/19 201 lb (91.2 kg)  10/27/18 200 lb 11.2 oz (91 kg)  12/13/17 188 lb 7.9 oz (85.5 kg)      ASSESSMENT AND PLAN:  1 hypertension blood pressure is pretty good today.  We will check labs.  I would like to get records from both primary care physician as well as renal physician before considering any other change.  I will be in touch with the patient.  I have asked her to keep track of her blood pressure at home.  At the next clinic visit she should take her cuff to the doctor for calibration  2  Dyslipidemia.  Continue Crestor.  We will find the labs.  3  Morbid obesity.  Discussed diet and walking.  Patient needs more activity.  Follow-up will be based on the findings.  Sinus   Current medicines are reviewed at length with the patient today.  The patient does not have concerns regarding medicines.  Signed, [name], MD  02/27/2019 2:15 PM    [facility] [address], [place], [place]  [address] Phone: [phone]; Fax: [phone]

## 2019-03-13 ENCOUNTER — Encounter: Payer: Self-pay | Admitting: *Deleted

## 2019-03-21 ENCOUNTER — Ambulatory Visit: Payer: PRIVATE HEALTH INSURANCE | Attending: Internal Medicine

## 2019-03-21 DIAGNOSIS — Z23 Encounter for immunization: Secondary | ICD-10-CM | POA: Insufficient documentation

## 2019-03-21 NOTE — Progress Notes (Signed)
   Covid-19 Vaccination Clinic  Name:  Margaret Huerta    MRN: [id number] DOB: 02/11/1946  03/21/2019  [name] was observed post Covid-19 immunization for 30 minutes based on pre-vaccination screening without incidence. She was provided with Vaccine Information Sheet and instruction to access the V-Safe system.   [name] was instructed to call 911 with any severe reactions post vaccine: Marland Kitchen Difficulty breathing  . Swelling of your face and throat  . A fast heartbeat  . A bad rash all over your body  . Dizziness and weakness    Immunizations Administered    Name Date Dose VIS Date Route   Pfizer COVID-19 Vaccine 03/21/2019  5:21 PM 0.3 mL 01/23/2019 Intramuscular   Manufacturer: [place]   Lot: [id number]   [place]: [address]-1

## 2019-04-14 DIAGNOSIS — H35073 Retinal telangiectasis, bilateral: Secondary | ICD-10-CM | POA: Diagnosis not present

## 2019-04-14 DIAGNOSIS — E113392 Type 2 diabetes mellitus with moderate nonproliferative diabetic retinopathy without macular edema, left eye: Secondary | ICD-10-CM | POA: Diagnosis not present

## 2019-04-14 DIAGNOSIS — H35372 Puckering of macula, left eye: Secondary | ICD-10-CM | POA: Diagnosis not present

## 2019-04-14 DIAGNOSIS — E113391 Type 2 diabetes mellitus with moderate nonproliferative diabetic retinopathy without macular edema, right eye: Secondary | ICD-10-CM | POA: Diagnosis not present

## 2019-04-14 DIAGNOSIS — H3561 Retinal hemorrhage, right eye: Secondary | ICD-10-CM | POA: Diagnosis not present

## 2019-04-15 ENCOUNTER — Ambulatory Visit: Payer: PRIVATE HEALTH INSURANCE | Attending: Internal Medicine

## 2019-04-15 DIAGNOSIS — Z23 Encounter for immunization: Secondary | ICD-10-CM | POA: Insufficient documentation

## 2019-04-15 NOTE — Progress Notes (Signed)
   Covid-19 Vaccination Clinic  Name:  Margaret Huerta    MRN: [id number] DOB: 1945-06-12  04/15/2019  [name] was observed post Covid-19 immunization for 30 minutes based on pre-vaccination screening without incident. She was provided with Vaccine Information Sheet and instruction to access the V-Safe system.   [name] was instructed to call 911 with any severe reactions post vaccine: Marland Kitchen Difficulty breathing  . Swelling of face and throat  . A fast heartbeat  . A bad rash all over body  . Dizziness and weakness   Immunizations Administered    Name Date Dose VIS Date Route   Pfizer COVID-19 Vaccine 04/15/2019  2:24 PM 0.3 mL 01/23/2019 Intramuscular   Manufacturer: [place]   Lot: [id number]   [place]: [address]-9

## 2019-04-22 DIAGNOSIS — E1129 Type 2 diabetes mellitus with other diabetic kidney complication: Secondary | ICD-10-CM | POA: Diagnosis not present

## 2019-04-22 DIAGNOSIS — E211 Secondary hyperparathyroidism, not elsewhere classified: Secondary | ICD-10-CM | POA: Diagnosis not present

## 2019-04-22 DIAGNOSIS — E1122 Type 2 diabetes mellitus with diabetic chronic kidney disease: Secondary | ICD-10-CM | POA: Diagnosis not present

## 2019-04-22 DIAGNOSIS — E870 Hyperosmolality and hypernatremia: Secondary | ICD-10-CM | POA: Diagnosis not present

## 2019-04-22 DIAGNOSIS — R809 Proteinuria, unspecified: Secondary | ICD-10-CM | POA: Diagnosis not present

## 2019-04-22 DIAGNOSIS — N189 Chronic kidney disease, unspecified: Secondary | ICD-10-CM | POA: Diagnosis not present

## 2019-04-29 DIAGNOSIS — E559 Vitamin D deficiency, unspecified: Secondary | ICD-10-CM | POA: Diagnosis not present

## 2019-04-29 DIAGNOSIS — E211 Secondary hyperparathyroidism, not elsewhere classified: Secondary | ICD-10-CM | POA: Diagnosis not present

## 2019-04-29 DIAGNOSIS — N189 Chronic kidney disease, unspecified: Secondary | ICD-10-CM | POA: Diagnosis not present

## 2019-04-29 DIAGNOSIS — R809 Proteinuria, unspecified: Secondary | ICD-10-CM | POA: Diagnosis not present

## 2019-04-29 DIAGNOSIS — I129 Hypertensive chronic kidney disease with stage 1 through stage 4 chronic kidney disease, or unspecified chronic kidney disease: Secondary | ICD-10-CM | POA: Diagnosis not present

## 2019-05-04 ENCOUNTER — Encounter: Payer: Self-pay | Admitting: Family Medicine

## 2019-05-04 DIAGNOSIS — E1129 Type 2 diabetes mellitus with other diabetic kidney complication: Secondary | ICD-10-CM | POA: Diagnosis not present

## 2019-05-04 DIAGNOSIS — N184 Chronic kidney disease, stage 4 (severe): Secondary | ICD-10-CM | POA: Diagnosis not present

## 2019-05-04 DIAGNOSIS — R609 Edema, unspecified: Secondary | ICD-10-CM | POA: Diagnosis not present

## 2019-05-04 DIAGNOSIS — Z79899 Other long term (current) drug therapy: Secondary | ICD-10-CM | POA: Diagnosis not present

## 2019-05-11 DIAGNOSIS — R7309 Other abnormal glucose: Secondary | ICD-10-CM | POA: Diagnosis not present

## 2019-05-11 DIAGNOSIS — N184 Chronic kidney disease, stage 4 (severe): Secondary | ICD-10-CM | POA: Diagnosis not present

## 2019-05-11 DIAGNOSIS — E785 Hyperlipidemia, unspecified: Secondary | ICD-10-CM | POA: Diagnosis not present

## 2019-05-11 DIAGNOSIS — E1122 Type 2 diabetes mellitus with diabetic chronic kidney disease: Secondary | ICD-10-CM | POA: Diagnosis not present

## 2019-06-24 DIAGNOSIS — E1122 Type 2 diabetes mellitus with diabetic chronic kidney disease: Secondary | ICD-10-CM | POA: Diagnosis not present

## 2019-06-24 DIAGNOSIS — E1129 Type 2 diabetes mellitus with other diabetic kidney complication: Secondary | ICD-10-CM | POA: Diagnosis not present

## 2019-06-24 DIAGNOSIS — E211 Secondary hyperparathyroidism, not elsewhere classified: Secondary | ICD-10-CM | POA: Diagnosis not present

## 2019-06-24 DIAGNOSIS — R809 Proteinuria, unspecified: Secondary | ICD-10-CM | POA: Diagnosis not present

## 2019-06-24 DIAGNOSIS — N189 Chronic kidney disease, unspecified: Secondary | ICD-10-CM | POA: Diagnosis not present

## 2019-07-01 DIAGNOSIS — E559 Vitamin D deficiency, unspecified: Secondary | ICD-10-CM | POA: Diagnosis not present

## 2019-07-01 DIAGNOSIS — I129 Hypertensive chronic kidney disease with stage 1 through stage 4 chronic kidney disease, or unspecified chronic kidney disease: Secondary | ICD-10-CM | POA: Diagnosis not present

## 2019-07-01 DIAGNOSIS — E211 Secondary hyperparathyroidism, not elsewhere classified: Secondary | ICD-10-CM | POA: Diagnosis not present

## 2019-07-01 DIAGNOSIS — N189 Chronic kidney disease, unspecified: Secondary | ICD-10-CM | POA: Diagnosis not present

## 2019-07-01 DIAGNOSIS — R809 Proteinuria, unspecified: Secondary | ICD-10-CM | POA: Diagnosis not present

## 2019-07-21 ENCOUNTER — Telehealth: Payer: Self-pay | Admitting: Internal Medicine

## 2019-07-21 MED ORDER — NITROSTAT 0.4 MG SL SUBL: 0.4000 mg | SUBLINGUAL_TABLET | SUBLINGUAL | 3 refills | Status: DC | PRN

## 2019-07-21 NOTE — Telephone Encounter (Signed)
Spoke with pt who states she has not had any reoccurrence of chest pain at this time. Pt denies SOB at this time. States that systolic BP has been running around 137-154. Pt has appt with NP Jonni Sanger on Friday. She will continue to monitor sx. Pt will be seen in the ED if sx reoccur. Please advise.

## 2019-07-21 NOTE — Telephone Encounter (Signed)
Pt called stating she had chest pain last night- with lt shoulder pain and a headache that will not go away-- stated she did take a nitro. No SOB or other symptoms, currently not having chest pain.    Needing refill on her nitro-   Scheduled to see Jonni Sanger on Friday in [place]  Please call [phone]

## 2019-07-23 NOTE — Progress Notes (Addendum)
Cardiology Office Note  Date: 07/24/2019   ID: Margaret Huerta, DOB 1945-10-13, MRN [id number]  PCP:  [name], MD  Cardiologist:  [name], MD Electrophysiologist:  None   Chief Complaint: Follow-up hypertension, chest pain  History of Present Illness: Margaret Huerta is a 74 y.o. female with a history of hypertension.  Hyperlipidemia, GERD, type 2 diabetes, hiatal hernia, fibromyalgia. OSA non - compliant with CPAP, status post right nephrectomy for renal cell CA. CKD Stage IV. Sees Dr [name].  Previous left heart cath in 2003 and 2016 for abnormal stress test.  Both catheterizations showed normal coronary arteries.  Had been seen in the past by Dr. [name] for syncope believed to be medication related.  Last encounter with Dr. [name] on 02/27/2019.  During that visit she had stated she had been doing okay.  She noted over the previous 6 months her blood pressure had become elevated and was seen in the renal clinic and medications were adjusted with current systolic pressures in the 130s to 140s.  She was complaining of a lot of cramping in her toes.She was to keep track of her BP at home and bring her BP cuff to PCP for calibration. She was to continue Crestor. Walking and diet were stressed.  Telephone encounter 07/21/2019. Patient called stating she had chest pain the previous night with shoulder pain and headache that would not go away.  She did take a nitroglycerin.  She denied any shortness of breath or other symptoms.  She later spoke to clinical staff and stated she had not had a recurrence of chest pain.  Stated her systolic blood pressure had been running around 137-154.  Patient states she continues to have chest pain on and off.  States it can occur with and without exertion.  She points to her epigastric region as the origin of the pain.  States it has radiated to her left arm and she becomes nauseous on occasion.  She also states she becomes nauseous after eating.  She has a history  of diabetes and believes she may have some gastroparesis.  She has had a history of gastric bypass surgery.  She did not describe the epigastric pain as reflux-like symptoms.  States she does have some exertional chest discomfort when attempting to walk with her husband.  Also has exertional dyspnea when walking.   Past Medical History:  Diagnosis Date  . Anemia   . Anxiety   . Cervical cancer ([place])   . Chronic bronchitis ([place])    "get it q yr"  . Chronic lower back pain   . Depression   . Febrile seizure ([place])    "as a child"  . Fibromyalgia   . GERD (gastroesophageal reflux disease)   . Gout   . History of blood transfusion    "S/P tonsillectomy"  . History of hiatal hernia   . Hx of cardiovascular stress test April 2016   false positive Myoview  . Hyperlipidemia   . Hypertension   . Migraine    "monthly" (05/26/2014)  . Neuropathy   . Osteoporosis   . Parathyroid disease ([place])    "my PHT levels run high; I can't take calcium"  . Pneumonia "several times"  . Renal cancer ([place])    "right"-s/p nephrectomy  . Renal insufficiency    "left kidney works at 40-60%" (05/26/2014)  . Sleep apnea    "wore mask for 2 months; could not take it" (05/26/2014)  . Type II diabetes mellitus (  [place])   . Typhus fever    "as a child"    Past Surgical History:  Procedure Laterality Date  . ABDOMINAL HERNIA REPAIR  02/2013   "in [place]"  . ABDOMINAL HYSTERECTOMY  1977   "partial"  . APPENDECTOMY    . BACK SURGERY    . BILATERAL SALPINGOOPHORECTOMY Bilateral ~ 1993  . CARDIAC CATHETERIZATION  2003, April 2016   normal coronaries  . CATARACT EXTRACTION W/ INTRAOCULAR LENS  IMPLANT, BILATERAL  2015  . COLONOSCOPY N/A 11/25/2014   Procedure: COLONOSCOPY;  Surgeon: Rogene Houston, MD;  Location: [facility];  Service: Endoscopy;  Laterality: N/A;  . CYSTOSCOPY W/ [place] MANIPULATION  1988  . DIAGNOSTIC LAPAROSCOPY  1975  . DILATION AND CURETTAGE OF UTERUS    .  ESOPHAGOGASTRODUODENOSCOPY N/A 11/25/2014   Procedure: ESOPHAGOGASTRODUODENOSCOPY (EGD);  Surgeon: Rogene Houston, MD;  Location: [facility];  Service: Endoscopy;  Laterality: N/A;  9:30 - moved to 11:25 - [name] to notify pt  . ESOPHAGOGASTRODUODENOSCOPY (EGD) WITH ESOPHAGEAL DILATION  1997  . EYE SURGERY    . GASTRIC BYPASS  2012  . HERNIA REPAIR     2015  . HIATAL HERNIA REPAIR  1996  . INCONTINENCE SURGERY  1983  . KNEE ARTHROSCOPY Left 2000's  . LAPAROSCOPIC CHOLECYSTECTOMY  1990's  . LEFT HEART CATHETERIZATION WITH CORONARY ANGIOGRAM N/A 05/27/2014   Procedure: LEFT HEART CATHETERIZATION WITH CORONARY ANGIOGRAM;  Surgeon: Lorretta Harp, MD;  Location: [facility] CATH LAB;  Service: Cardiovascular;  Laterality: N/A;  . LUMBAR [place] SURGERY  2001   "herniated discs"  . [place]   "drilled into gum and put tooth implants uppers"  . NEPHRECTOMY Right 2002   "cancer"  . TMJ ARTHROPLASTY  1988  . TONSILLECTOMY  1976  . UPPER GI ENDOSCOPY      Current Outpatient Medications  Medication Sig Dispense Refill  . acetaminophen (TYLENOL) 325 MG tablet Take 2 tablets (650 mg total) by mouth every 4 (four) hours as needed for headache or mild pain.    Marland Kitchen allopurinol (ZYLOPRIM) 300 MG tablet Take 300 mg by mouth daily.   12  . atenolol (TENORMIN) 50 MG tablet TAKE ONE TABLET BY MOUTH ONCE DAILY (Patient taking differently: TAKE ONE TABLET BY MOUTH twice a day) 30 tablet 7  . cholecalciferol (VITAMIN D) 1000 UNITS tablet Take 5,000 Units by mouth daily.     . Cyanocobalamin (VITAMIN B 12 PO) Take 1 tablet by mouth daily.    . diphenhydrAMINE (BENADRYL) 25 mg capsule Take 25 mg by mouth 2 (two) times daily as needed.    . docusate sodium (COLACE) 100 MG capsule Take 100 mg by mouth daily.     . DULoxetine (CYMBALTA) 60 MG capsule Take 60 mg by mouth daily.      . furosemide (LASIX) 40 MG tablet Take 0.5 tablets (20 mg total) by mouth daily. Take 40 mg in the AM and 20 mg in the PM (Patient  taking differently: Take 40 mg by mouth 2 (two) times daily. Take 40 mg in the AM and 20 mg in the PM) 30 tablet   . gabapentin (NEURONTIN) 100 MG capsule TAKE 1 CAPSULE BY MOUTH AT BEDTIME FOR 5 DAYS THEN 2 AT BEDTIME FOR 5 DAYS THEN 3 AT BEDTIME    . insulin glargine (LANTUS) 100 UNIT/ML injection Inject 60 Units into the skin every morning.     Marland Kitchen NITROSTAT 0.4 MG SL tablet Place 1 tablet (  0.4 mg total) under the tongue as needed. 25 tablet 3  . rosuvastatin (CRESTOR) 20 MG tablet Take 20 mg by mouth.     . telmisartan (MICARDIS) 80 MG tablet Take 1 tablet by mouth daily.     No current facility-administered medications for this visit.   Allergies:  Demerol [meperidine], Hydrocodone-acetaminophen, Meperidine hcl, Vicodin [hydrocodone-acetaminophen], Ace inhibitors, Codeine, Contrast media [iodinated diagnostic agents], Fentanyl, Hydromorphone hcl, Morphine, Naproxen, and Dilaudid [hydromorphone hcl]   Social History: The patient  reports that she has never smoked. She has never used smokeless tobacco. She reports that she does not drink alcohol and does not use drugs.   Family History: The patient's family history includes Alcohol abuse in her maternal uncle; Allergies in an other family member; Clotting disorder in her mother; Diabetes in her mother; Emphysema in her father; Heart disease in her father; Kidney disease in her mother; Lung cancer in her maternal uncle.   ROS:  Please see the history of present illness. Otherwise, complete review of systems is positive for none.  All other systems are reviewed and negative.   Physical Exam: VS:  There were no vitals taken for this visit., BMI There is no height or weight on file to calculate BMI.  Wt Readings from Last 3 Encounters:  02/27/19 201 lb (91.2 kg)  10/27/18 200 lb 11.2 oz (91 kg)  12/13/17 188 lb 7.9 oz (85.5 kg)    General: Patient appears comfortable at rest. HEENT: Conjunctiva and lids normal, oropharynx clear with moist  mucosa. Neck: Supple, no elevated JVP or carotid bruits, no thyromegaly. Lungs: Clear to auscultation, nonlabored breathing at rest. Cardiac: Regular rate and rhythm, no S3 or significant systolic murmur, no pericardial rub. Abdomen: Soft, nontender, no hepatomegaly, bowel sounds present, no guarding or rebound. Extremities: No pitting edema, distal pulses 2+. Skin: Warm and dry. Musculoskeletal: No kyphosis. Neuropsychiatric: Alert and oriented x3, affect grossly appropriate.  ECG:  An ECG dated 07/24/2019 was personally reviewed today and demonstrated:  Normal sinus rhythm rate of 63.  No ST or T wave abnormalities or ectopy noted.  Recent Labwork: 11/06/2018: Hemoglobin 13.1; Platelets 215  No results found for: CHOL, TRIG, HDL, CHOLHDL, VLDL, LDLCALC, LDLDIRECT  Other Studies Reviewed Today:   Assessment and Plan:  1. Chest pain of uncertain etiology   2. Essential hypertension   3. CKD (chronic kidney disease) stage 4, GFR 15-29 ml/min (HCC)   4. Mixed hyperlipidemia   5. Controlled type 2 diabetes mellitus with diabetic nephropathy, unspecified whether long term insulin use ([place])    1. Chest pain of uncertain etiology Recent complaints of chest pain with and without exertion.  States the pain sometimes radiates to her left arm.  She is having exertional chest pain when walking with her husband along with exertional dyspnea.  States she is having some chest pain while sitting in the exam room.  She points to the epigastric area as the source of the pain.  She is wondering if she may have gastroparesis.  Given her symptoms we need to order a Lexiscan stress test.  Given her exertional dyspnea please order an echocardiogram.  Also start isosorbide dinitrate 10 mg p.o. twice daily.  Use sublingual nitroglycerin for breakthrough chest pain after starting isosorbide.  2. Essential hypertension Blood pressure today 138/70 heart rate of 62.  Continue atenolol 1 tablet by mouth daily.  3.  CKD (chronic kidney disease) stage 4, GFR 15-29 ml/min [facility]) Patient states she sees Dr. [name] quite frequently.  She only has one kidney she had a right nephrectomy in the past for cancer.  She states she has stage V renal disease currently.  4. Mixed hyperlipidemia Continue Crestor 20 mg daily  5. Controlled type 2 diabetes mellitus with diabetic nephropathy, unspecified whether long term insulin use ([place]) Most recent hemoglobin A1c December 2020 at PCP office was 7%  Medication Adjustments/Labs and Tests Ordered: Current medicines are reviewed at length with the patient today.  Concerns regarding medicines are outlined above.   Disposition: Follow-up with Dr. [name] or APP 1 month  Signed, Levell July, NP 07/24/2019 12:08 AM    [facility] [place], [place], [place] [address] Phone: [phone]; Fax: [phone]

## 2019-07-24 ENCOUNTER — Other Ambulatory Visit: Payer: Self-pay

## 2019-07-24 ENCOUNTER — Encounter: Payer: Self-pay | Admitting: *Deleted

## 2019-07-24 ENCOUNTER — Encounter: Payer: Self-pay | Admitting: Family Medicine

## 2019-07-24 ENCOUNTER — Ambulatory Visit: Payer: PRIVATE HEALTH INSURANCE | Admitting: Family Medicine

## 2019-07-24 VITALS — BP 138/70 | HR 62 | Ht 62.0 in | Wt 190.0 lb

## 2019-07-24 DIAGNOSIS — R079 Chest pain, unspecified: Secondary | ICD-10-CM

## 2019-07-24 DIAGNOSIS — E782 Mixed hyperlipidemia: Secondary | ICD-10-CM

## 2019-07-24 DIAGNOSIS — I10 Essential (primary) hypertension: Secondary | ICD-10-CM

## 2019-07-24 DIAGNOSIS — N184 Chronic kidney disease, stage 4 (severe): Secondary | ICD-10-CM

## 2019-07-24 DIAGNOSIS — E1121 Type 2 diabetes mellitus with diabetic nephropathy: Secondary | ICD-10-CM

## 2019-07-24 MED ORDER — ISOSORBIDE DINITRATE 10 MG PO TABS: 10.0000 mg | ORAL_TABLET | Freq: Three times a day (TID) | ORAL | 3 refills | Status: DC

## 2019-07-24 NOTE — Patient Instructions (Addendum)
Your physician recommends that you schedule a follow-up appointment in: [place], NP   Your physician has recommended you make the following change in your medication:   START ISOSORBIDE 10 MG TWICE DAILY   .Your physician has requested that you have a lexiscan myoview. For further information please visit [email or web address]. Please follow instruction sheet, as given.  Your physician has requested that you have an echocardiogram. Echocardiography is a painless test that uses sound waves to create images of your heart. It provides your doctor with information about the size and shape of your heart and how well your heart's chambers and valves are working. This procedure takes approximately one hour. There are no restrictions for this procedure.  Thank you for choosing [facility]!!

## 2019-07-24 NOTE — Telephone Encounter (Signed)
Pt seen in office today.

## 2019-08-05 ENCOUNTER — Encounter (HOSPITAL_COMMUNITY)
Admission: RE | Admit: 2019-08-05 | Discharge: 2019-08-05 | Disposition: A | Discharge status: Home / Self Care | Payer: Medicare Other | Source: Ambulatory Visit | Attending: Family Medicine | Admitting: Family Medicine

## 2019-08-05 ENCOUNTER — Other Ambulatory Visit: Payer: Self-pay

## 2019-08-05 ENCOUNTER — Ambulatory Visit (HOSPITAL_COMMUNITY)
Admission: RE | Admit: 2019-08-05 | Discharge: 2019-08-05 | Disposition: A | Discharge status: Home / Self Care | Payer: Medicare Other | Source: Ambulatory Visit | Attending: Family Medicine | Admitting: Family Medicine

## 2019-08-05 ENCOUNTER — Ambulatory Visit (HOSPITAL_BASED_OUTPATIENT_CLINIC_OR_DEPARTMENT_OTHER)
Admission: RE | Admit: 2019-08-05 | Discharge: 2019-08-05 | Disposition: A | Discharge status: Home / Self Care | Payer: Medicare Other | Source: Ambulatory Visit | Attending: Family Medicine | Admitting: Family Medicine

## 2019-08-05 DIAGNOSIS — R079 Chest pain, unspecified: Secondary | ICD-10-CM | POA: Diagnosis not present

## 2019-08-05 LAB — NM MYOCAR MULTI W/SPECT W/WALL MOTION / EF
LV dias vol: 71 mL (ref 46–106)
LV sys vol: 19 mL
Peak HR: 74 {beats}/min
RATE: 0.35
Rest HR: 49 {beats}/min
SDS: 2
SRS: 0
SSS: 2
TID: 1.04

## 2019-08-05 MED ORDER — TECHNETIUM TC 99M TETROFOSMIN IV KIT
10.0000 | PACK | Freq: Once | INTRAVENOUS | Status: AC | PRN
  Administered 2019-08-05: 10.2 via INTRAVENOUS

## 2019-08-05 MED ORDER — REGADENOSON 0.4 MG/5ML IV SOLN
INTRAVENOUS | Status: AC
  Administered 2019-08-05: 0.4 mg via INTRAVENOUS
  Filled 2019-08-05: qty 5

## 2019-08-05 MED ORDER — SODIUM CHLORIDE FLUSH 0.9 % IV SOLN
INTRAVENOUS | Status: AC
  Administered 2019-08-05: 10 mL via INTRAVENOUS
  Filled 2019-08-05: qty 10

## 2019-08-05 MED ORDER — TECHNETIUM TC 99M TETROFOSMIN IV KIT
30.0000 | PACK | Freq: Once | INTRAVENOUS | Status: AC | PRN
  Administered 2019-08-05: 30 via INTRAVENOUS

## 2019-08-05 NOTE — Progress Notes (Signed)
*  PRELIMINARY RESULTS* Echocardiogram 2D Echocardiogram has been performed.  Margaret Huerta 08/05/2019, 11:31 AM

## 2019-08-06 DIAGNOSIS — E1129 Type 2 diabetes mellitus with other diabetic kidney complication: Secondary | ICD-10-CM | POA: Diagnosis not present

## 2019-08-07 ENCOUNTER — Telehealth: Payer: Self-pay | Admitting: *Deleted

## 2019-08-07 NOTE — Telephone Encounter (Signed)
-----   Message from Verta Ellen., NP sent at 08/06/2019  1:02 PM EDT ----- Please call the patient and let her know the echocardiogram showed good pumping function of her heart. Her left ventricle is a little stiff and muscular and has problems relaxing when trying to fill. Best treatment is to take BP medications and keep BP under 130/80. Her mitral valve is a little leaky. Otherwise everything looks ok.

## 2019-08-07 NOTE — Telephone Encounter (Signed)
Patient informed. Copy sent to PCP °

## 2019-08-09 NOTE — Progress Notes (Signed)
[name]    Cardiology Office Note  Date: 08/10/2019   ID: Margaret Huerta, DOB [birth year], MRN [id number]  PCP:  Margaret Noble, MD  Cardiologist:  Margaret Carnes, MD Electrophysiologist:  None   Chief Complaint: Follow-up hypertension, chest pain  History of Present Illness: Margaret Huerta is a 74 y.o. female with a history of hypertension.  Hyperlipidemia, GERD, type 2 diabetes, hiatal hernia, fibromyalgia. OSA non - compliant with CPAP, status post right nephrectomy for renal cell CA. CKD Stage IV. Sees Dr Margaret Huerta.  Previous left heart cath in 2003 and 2016 for abnormal stress test.  Both catheterizations showed normal coronary arteries.  Had been seen in the past by Dr. Lovena Huerta for syncope believed to be medication related.  Last encounter with Dr. Harrington Huerta on 02/27/2019.  During that visit she had stated she had been doing okay.  She noted over the previous 6 months her blood pressure had become elevated and was seen in the renal clinic and medications were adjusted with current systolic pressures in the 130s to 140s.  She was complaining of a lot of cramping in her toes.She was to keep track of her BP at home and bring her BP cuff to PCP for calibration. She was to continue Crestor. Walking and diet were stressed.  Telephone encounter 07/21/2019. Patient called stating she had chest pain the previous night with shoulder pain and headache that would not go away.  She did take a nitroglycerin.  She denied any shortness of breath or other symptoms.  She later spoke to clinical staff and stated she had not had a recurrence of chest pain.  Stated her systolic blood pressure had been running around 137-154.  At last patient stated she continued to have chest pain on and off.  Stated it could occur with and without exertion.  She pointed to her epigastric region as the origin of the pain.  Stated it  radiated to her left arm and she became nauseous on occasion.  She also stated she became nauseous after eating.  She has  a history of diabetes and believed she may have some gastroparesis. Previous history of gastric bypass surgery.  She did not describe the epigastric pain as reflux-like symptoms.  Stated she did have some exertional chest discomfort when attempting to walk with her husband.  Also had exertional dyspnea when walking.  She is here for 1 month follow-up.  Stress test and echocardiogram results reviewed with her.  She verbalizes understanding.  States she continues to have some chest pain but improved since starting isosorbide dinitrate p.o. twice daily.  States her blood pressures have been creeping up lately on the telmisartan.  States she used to be on Benicar which was controlling her blood pressure much better but had to stop due to insurance not paying for the medication.  He denies any other anginal or exertional symptoms, palpitations or arrhythmias, orthostatic symptoms, stroke or TIA-like symptoms.  States he recently had some "internal shingles on the right side" with pain along her right rib cage and breast.  This has resolved.  States she would like to go back on Benicar if possible.  She states her insurance will now pay for the medication.  She states she is due to see her gastroenterologist Dr. Laural Huerta soon.  She believes some of her chest pain symptoms may be due to gastrointestinal issues.  She believes she may have some element of gastroparesis.   Past Medical History:  Diagnosis Date  . Anemia   .  Anxiety   . Cervical cancer ([place])   . Chronic bronchitis ([place])    "get it q yr"  . Chronic lower back pain   . Depression   . Febrile seizure ([place])    "as a child"  . Fibromyalgia   . GERD (gastroesophageal reflux disease)   . Gout   . History of blood transfusion    "S/P tonsillectomy"  . History of hiatal hernia   . Hx of cardiovascular stress test April 2016   false positive Myoview  . Hyperlipidemia   . Hypertension   . Migraine    "monthly" (05/26/2014)  . Neuropathy   .  Osteoporosis   . Parathyroid disease ([place])    "my PHT levels run high; I can't take calcium"  . Pneumonia "several times"  . Renal cancer ([place])    "right"-s/p nephrectomy  . Renal insufficiency    "left kidney works at 40-60%" (05/26/2014)  . Sleep apnea    "wore mask for 2 months; could not take it" (05/26/2014)  . Type II diabetes mellitus ([place])   . Typhus fever    "as a child"    Past Surgical History:  Procedure Laterality Date  . ABDOMINAL HERNIA REPAIR  02/2013   "in [place]"  . ABDOMINAL HYSTERECTOMY  1977   "partial"  . APPENDECTOMY    . BACK SURGERY    . BILATERAL SALPINGOOPHORECTOMY Bilateral ~ 1993  . CARDIAC CATHETERIZATION  2003, April 2016   normal coronaries  . CATARACT EXTRACTION W/ INTRAOCULAR LENS  IMPLANT, BILATERAL  2015  . COLONOSCOPY N/A 11/25/2014   Procedure: COLONOSCOPY;  Surgeon: Rogene Houston, MD;  Location: [facility];  Service: Endoscopy;  Laterality: N/A;  . CYSTOSCOPY W/ [place] MANIPULATION  1988  . DIAGNOSTIC LAPAROSCOPY  1975  . DILATION AND CURETTAGE OF UTERUS    . ESOPHAGOGASTRODUODENOSCOPY N/A 11/25/2014   Procedure: ESOPHAGOGASTRODUODENOSCOPY (EGD);  Surgeon: Rogene Houston, MD;  Location: [facility];  Service: Endoscopy;  Laterality: N/A;  9:30 - moved to 11:25 - [name] to notify pt  . ESOPHAGOGASTRODUODENOSCOPY (EGD) WITH ESOPHAGEAL DILATION  1997  . EYE SURGERY    . GASTRIC BYPASS  2012  . HERNIA REPAIR     2015  . HIATAL HERNIA REPAIR  1996  . INCONTINENCE SURGERY  1983  . KNEE ARTHROSCOPY Left 2000's  . LAPAROSCOPIC CHOLECYSTECTOMY  1990's  . LEFT HEART CATHETERIZATION WITH CORONARY ANGIOGRAM N/A 05/27/2014   Procedure: LEFT HEART CATHETERIZATION WITH CORONARY ANGIOGRAM;  Surgeon: Lorretta Harp, MD;  Location: [facility] CATH LAB;  Service: Cardiovascular;  Laterality: N/A;  . LUMBAR [place] SURGERY  2001   "herniated discs"  . [place]   "drilled into gum and put tooth implants uppers"  . NEPHRECTOMY Right 2002    "cancer"  . TMJ ARTHROPLASTY  1988  . TONSILLECTOMY  1976  . UPPER GI ENDOSCOPY      Current Outpatient Medications  Medication Sig Dispense Refill  . acetaminophen (TYLENOL) 325 MG tablet Take 2 tablets (650 mg total) by mouth every 4 (four) hours as needed for headache or mild pain.    Marland Kitchen allopurinol (ZYLOPRIM) 300 MG tablet Take 300 mg by mouth daily.   12  . atenolol (TENORMIN) 50 MG tablet Take 50 mg by mouth 2 (two) times daily.    Marland Kitchen docusate sodium (COLACE) 100 MG capsule Take 100 mg by mouth daily.     . DULoxetine (CYMBALTA) 60 MG capsule Take 60 mg by mouth  daily.      . furosemide (LASIX) 40 MG tablet Take 40 mg by mouth 2 (two) times daily.    . isosorbide dinitrate (ISORDIL) 10 MG tablet Take 10 mg by mouth 2 (two) times daily.    Marland Kitchen NITROSTAT 0.4 MG SL tablet Place 1 tablet (0.4 mg total) under the tongue as needed. 25 tablet 3  . rosuvastatin (CRESTOR) 20 MG tablet Take 20 mg by mouth daily.    Marland Kitchen telmisartan (MICARDIS) 80 MG tablet Take 1 tablet by mouth daily.     No current facility-administered medications for this visit.   Allergies:  Demerol [meperidine], Hydrocodone-acetaminophen, Meperidine hcl, Vicodin [hydrocodone-acetaminophen], Ace inhibitors, Codeine, Contrast media [iodinated diagnostic agents], Fentanyl, Hydromorphone hcl, Morphine, Naproxen, and Dilaudid [hydromorphone hcl]   Social History: The patient  reports that she has never smoked. She has never used smokeless tobacco. She reports that she does not drink alcohol and does not use drugs.   Family History: The patient's family history includes Alcohol abuse in her maternal uncle; Allergies in an other family member; Clotting disorder in her mother; Diabetes in her mother; Emphysema in her father; Heart disease in her father; Kidney disease in her mother; Lung cancer in her maternal uncle.   ROS:  Please see the history of present illness. Otherwise, complete review of systems is positive for none.  All other  systems are reviewed and negative.   Physical Exam: VS:  BP 138/74   Pulse (!) 52   Ht 5\' 2"  (1.575 m)   Wt 192 lb 3.2 oz (87.2 kg)   SpO2 97%   BMI 35.15 kg/m , BMI Body mass index is 35.15 kg/m.  Wt Readings from Last 3 Encounters:  08/10/19 192 lb 3.2 oz (87.2 kg)  07/24/19 190 lb (86.2 kg)  02/27/19 201 lb (91.2 kg)    General: Patient appears comfortable at rest. Neck: Supple, no elevated JVP or carotid bruits, no thyromegaly. Lungs: Clear to auscultation, nonlabored breathing at rest. Cardiac: Regular rate and rhythm, no S3 or significant systolic murmur, no pericardial rub. Extremities: No pitting edema, distal pulses 2+. Skin: Warm and dry. Musculoskeletal: No kyphosis. Neuropsychiatric: Alert and oriented x3, affect grossly appropriate.  ECG:  An ECG dated 07/24/2019 was personally reviewed today and demonstrated:  Normal sinus rhythm rate of 63.  No ST or T wave abnormalities or ectopy noted.  Recent Labwork: 11/06/2018: Hemoglobin 13.1; Platelets 215  No results found for: CHOL, TRIG, HDL, CHOLHDL, VLDL, LDLCALC, LDLDIRECT  Other Studies Reviewed Today:  Nuclear stress test 08/05/2019   There was no ST segment deviation noted during stress.  Findings consistent with mild apical ischemia.  This is a low risk study.  The left ventricular ejection fraction is hyperdynamic (>65%).    Echocardiogram 08/05/2019  1. Left ventricular ejection fraction, by estimation, is 60 to 65%. The left ventricle has normal function. The left ventricle has no regional wall motion abnormalities. There is mild left ventricular hypertrophy. Left ventricular diastolic parameters are consistent with Grade I diastolic dysfunction (impaired relaxation). 2. Right ventricular systolic function is normal. The right ventricular size is normal. There is mildly elevated pulmonary artery systolic pressure. 3. Left atrial size was severely dilated. 4. Right atrial size was mildly  dilated. 5. The mitral valve is normal in structure. Mild mitral valve regurgitation. No evidence of mitral stenosis. 6. The aortic valve is tricuspid. Aortic valve regurgitation is not visualized. No aortic stenosis is present. 7. The inferior vena cava is normal in  size with greater than 50% respiratory variability, suggesting right atrial pressure of 3 mmHg. 8. Left to right atrial shunting noted by color Doppler, probable PFO.   Assessment and Plan:  1. Chest pain of uncertain etiology Recent stress test was negative except for mild apical ischemia.  Considered a low risk stress.  States her chest pain has decreased some but she still has occasional episodes without radiation or associated symptoms.  Increase isosorbide dinitrate to 20 mg a.m. and 10 mg p.m.  2. Essential hypertension Blood pressure 138/74 today.  Patient states her blood pressures have recently been creeping up.  States she has gotten measurements as high as 544 systolic.  She states he used to be on Benicar which managed the blood pressure much better than the telmisartan.  She would like to go back on the previous dose of Benicar and stop the telmisartan.  She states the only reason she had to stop Benicar was due to insurance not paying.  Stop telmisartan and start Benicar 40 mg daily.  3. CKD (chronic kidney disease) stage 4, GFR 15-29 ml/min [facility]) Patient states she sees Dr. Theador Huerta quite frequently.  She only has one kidney.  She had a right nephrectomy in the past for cancer.  She states she has stage V renal disease currently.  4. Mixed hyperlipidemia Continue Crestor 20 mg daily.  Please obtain recent lipid profile from PCP.  5. Controlled type 2 diabetes mellitus with diabetic nephropathy, unspecified whether long term insulin use ([place]) Most recent hemoglobin A1c December 2020 at PCP office was 7%.  Follows with PCP  Medication Adjustments/Labs and Tests Ordered: Current medicines are reviewed at length  with the patient today.  Concerns regarding medicines are outlined above.   Disposition: Follow-up with Dr. Harrington Huerta or APP 6 months  Signed, Levell July, NP 08/10/2019 11:37 AM    [place] [facility] [place], [place], [place] [address] Phone: [phone]; Fax: [phone]

## 2019-08-10 ENCOUNTER — Ambulatory Visit: Payer: Medicare Other | Admitting: Family Medicine

## 2019-08-10 ENCOUNTER — Encounter: Payer: Self-pay | Admitting: Family Medicine

## 2019-08-10 ENCOUNTER — Encounter: Payer: Self-pay | Admitting: *Deleted

## 2019-08-10 VITALS — BP 138/74 | HR 52 | Ht 62.0 in | Wt 192.2 lb

## 2019-08-10 DIAGNOSIS — E782 Mixed hyperlipidemia: Secondary | ICD-10-CM | POA: Diagnosis not present

## 2019-08-10 DIAGNOSIS — E1121 Type 2 diabetes mellitus with diabetic nephropathy: Secondary | ICD-10-CM

## 2019-08-10 DIAGNOSIS — R079 Chest pain, unspecified: Secondary | ICD-10-CM | POA: Diagnosis not present

## 2019-08-10 DIAGNOSIS — I10 Essential (primary) hypertension: Secondary | ICD-10-CM

## 2019-08-10 DIAGNOSIS — N184 Chronic kidney disease, stage 4 (severe): Secondary | ICD-10-CM | POA: Diagnosis not present

## 2019-08-10 MED ORDER — OLMESARTAN MEDOXOMIL 40 MG PO TABS
40.0000 mg | ORAL_TABLET | Freq: Every day | ORAL | 2 refills | Status: DC
Start: 2019-08-10 — End: 2020-03-18

## 2019-08-10 MED ORDER — ISOSORBIDE DINITRATE 10 MG PO TABS: 20.0000 mg | ORAL_TABLET | Freq: Every morning | ORAL | 3 refills | Status: DC

## 2019-08-10 NOTE — Patient Instructions (Addendum)
Medication Instructions:    Your physician has recommended you make the following change in your medication:   Increase isosorbide dinitrate to 20 mg in the morning and 10 mg in the evening  Stop telmisartin (micardis)  Start olmesartan (benicar) 40 mg by mouth daily  Continue other medications the same  Labwork:  NONE  Testing/Procedures:  NONE  Follow-Up:  Your physician recommends that you schedule a follow-up appointment in: 6 months ([place] office). You will receive a reminder letter in the mail in about 4 months reminding you to call and schedule your appointment. If you don't receive this letter, please contact our office.  Any Other Special Instructions Will Be Listed Below (If Applicable).  If you need a refill on your cardiac medications before your next appointment, please call your pharmacy.

## 2019-08-13 DIAGNOSIS — R079 Chest pain, unspecified: Secondary | ICD-10-CM | POA: Diagnosis not present

## 2019-08-13 DIAGNOSIS — I10 Essential (primary) hypertension: Secondary | ICD-10-CM | POA: Diagnosis not present

## 2019-08-13 DIAGNOSIS — N184 Chronic kidney disease, stage 4 (severe): Secondary | ICD-10-CM | POA: Diagnosis not present

## 2019-08-13 DIAGNOSIS — E1122 Type 2 diabetes mellitus with diabetic chronic kidney disease: Secondary | ICD-10-CM | POA: Diagnosis not present

## 2019-08-28 DIAGNOSIS — E1122 Type 2 diabetes mellitus with diabetic chronic kidney disease: Secondary | ICD-10-CM | POA: Diagnosis not present

## 2019-08-28 DIAGNOSIS — E1129 Type 2 diabetes mellitus with other diabetic kidney complication: Secondary | ICD-10-CM | POA: Diagnosis not present

## 2019-08-28 DIAGNOSIS — E211 Secondary hyperparathyroidism, not elsewhere classified: Secondary | ICD-10-CM | POA: Diagnosis not present

## 2019-08-28 DIAGNOSIS — R809 Proteinuria, unspecified: Secondary | ICD-10-CM | POA: Diagnosis not present

## 2019-08-28 DIAGNOSIS — N189 Chronic kidney disease, unspecified: Secondary | ICD-10-CM | POA: Diagnosis not present

## 2019-09-04 DIAGNOSIS — I5032 Chronic diastolic (congestive) heart failure: Secondary | ICD-10-CM | POA: Diagnosis not present

## 2019-09-04 DIAGNOSIS — R809 Proteinuria, unspecified: Secondary | ICD-10-CM | POA: Diagnosis not present

## 2019-09-04 DIAGNOSIS — N189 Chronic kidney disease, unspecified: Secondary | ICD-10-CM | POA: Diagnosis not present

## 2019-09-04 DIAGNOSIS — I129 Hypertensive chronic kidney disease with stage 1 through stage 4 chronic kidney disease, or unspecified chronic kidney disease: Secondary | ICD-10-CM | POA: Diagnosis not present

## 2019-09-04 DIAGNOSIS — E211 Secondary hyperparathyroidism, not elsewhere classified: Secondary | ICD-10-CM | POA: Diagnosis not present

## 2019-09-23 ENCOUNTER — Telehealth: Payer: Self-pay | Admitting: Family Medicine

## 2019-09-23 MED ORDER — AMLODIPINE BESYLATE 5 MG PO TABS: 5.0000 mg | ORAL_TABLET | Freq: Every day | ORAL | 3 refills | Status: DC

## 2019-09-23 NOTE — Telephone Encounter (Signed)
Pt states that her BP has been elevated while on Benicar she report increased dizziness over the last 6 months. Pt states that she has been having left arm pain since Sunday morning. Pt reports that she did have some chest pain on yesterday but denies chest pain and SOB on today. She reports BP as follows Please advise.  8/11 196/88 HR 59 8/10 153/76 HR 57 8/9   172/96 HR 61 8/8   189/87 HR 54

## 2019-09-23 NOTE — Telephone Encounter (Signed)
Patient called stating that since she started the Benicar 40 mg her BP continues to stay elevated.

## 2019-09-23 NOTE — Telephone Encounter (Signed)
Pt should have f/u appt in about 6 wks

## 2019-09-23 NOTE — Telephone Encounter (Signed)
Recent low risk stress test. With high bp's would start norvasc 5mg  daily, update [place] on pain and bp's on Friday. Norvac can help with bp's as well as chest pain so hopefully may make some difference   J Sarit Sparano MD

## 2019-09-23 NOTE — Telephone Encounter (Signed)
Pt notified and voiced understanding 

## 2019-09-24 ENCOUNTER — Telehealth: Payer: Self-pay | Admitting: Family Medicine

## 2019-09-24 DIAGNOSIS — Z79899 Other long term (current) drug therapy: Secondary | ICD-10-CM

## 2019-09-24 NOTE — Telephone Encounter (Signed)
Patient called stating that she cannot take medication Norvac.  Please call [phone]

## 2019-09-24 NOTE — Telephone Encounter (Signed)
Amlodipine caused swelling in feet and hands, please advise    Added to list of allergies

## 2019-09-24 NOTE — Telephone Encounter (Signed)
Patient notified of f/u apt on 11/03/19 at 3 pm [place] with L.[name], NP

## 2019-10-08 NOTE — Telephone Encounter (Signed)
Decision for amlodipine was made by J Branch    Pt should come in for labs (BMET ) and also BP check in clinic

## 2019-10-08 NOTE — Telephone Encounter (Signed)
Will forward to Dr. Ross.  

## 2019-10-14 NOTE — Telephone Encounter (Signed)
Pt will get bmet and have bp checked 10/15/19

## 2019-10-14 NOTE — Addendum Note (Signed)
Addended by: Barbarann Ehlers A on: 10/14/2019 03:03 PM   Modules accepted: Orders

## 2019-10-15 ENCOUNTER — Ambulatory Visit (INDEPENDENT_AMBULATORY_CARE_PROVIDER_SITE_OTHER): Payer: Medicare Other

## 2019-10-15 ENCOUNTER — Other Ambulatory Visit: Payer: Self-pay

## 2019-10-15 ENCOUNTER — Other Ambulatory Visit (HOSPITAL_COMMUNITY)
Admission: RE | Admit: 2019-10-15 | Discharge: 2019-10-15 | Disposition: A | Discharge status: Home / Self Care | Payer: Medicare Other | Source: Ambulatory Visit | Attending: Internal Medicine | Admitting: Internal Medicine

## 2019-10-15 VITALS — BP 142/70 | HR 60

## 2019-10-15 DIAGNOSIS — Z0130 Encounter for examination of blood pressure without abnormal findings: Secondary | ICD-10-CM

## 2019-10-15 DIAGNOSIS — Z79899 Other long term (current) drug therapy: Secondary | ICD-10-CM | POA: Diagnosis not present

## 2019-10-15 LAB — BASIC METABOLIC PANEL
Anion gap: 9 (ref 5–15)
BUN: 46 mg/dL — ABNORMAL HIGH (ref 8–23)
CO2: 24 mmol/L (ref 22–32)
Calcium: 9 mg/dL (ref 8.9–10.3)
Chloride: 104 mmol/L (ref 98–111)
Creatinine, Ser: 1.92 mg/dL — ABNORMAL HIGH (ref 0.44–1.00)
GFR calc Af Amer: 30 mL/min — ABNORMAL LOW (ref >60)
GFR calc non Af Amer: 26 mL/min — ABNORMAL LOW (ref >60)
Glucose, Bld: 127 mg/dL — ABNORMAL HIGH (ref 70–99)
Potassium: 4.2 mmol/L (ref 3.5–5.1)
Sodium: 137 mmol/L (ref 135–145)

## 2019-10-15 NOTE — Patient Instructions (Signed)
Get lab work today:BMET   We will call you with results. We will forward this to Dr.Ross    Thank you for choosing [facility] !

## 2019-10-26 ENCOUNTER — Encounter (INDEPENDENT_AMBULATORY_CARE_PROVIDER_SITE_OTHER): Payer: PRIVATE HEALTH INSURANCE | Admitting: Ophthalmology

## 2019-10-27 ENCOUNTER — Encounter (INDEPENDENT_AMBULATORY_CARE_PROVIDER_SITE_OTHER): Payer: Self-pay | Admitting: Internal Medicine

## 2019-10-27 ENCOUNTER — Ambulatory Visit (INDEPENDENT_AMBULATORY_CARE_PROVIDER_SITE_OTHER): Payer: Medicare Other | Admitting: Internal Medicine

## 2019-10-27 ENCOUNTER — Other Ambulatory Visit: Payer: Self-pay

## 2019-10-27 DIAGNOSIS — K529 Noninfective gastroenteritis and colitis, unspecified: Secondary | ICD-10-CM | POA: Diagnosis not present

## 2019-10-27 MED ORDER — RIFAXIMIN 550 MG PO TABS: 550.0000 mg | ORAL_TABLET | Freq: Three times a day (TID) | ORAL | 0 refills | Status: DC

## 2019-10-27 MED ORDER — LOPERAMIDE HCL 2 MG PO TABS: 1.0000 mg | ORAL_TABLET | Freq: Every day | ORAL | 0 refills | Status: DC

## 2019-10-27 NOTE — Patient Instructions (Signed)
While on Xifaxan please keep stool diary as to frequency and consistency of stools. If Xifaxan is not covered start Imodium 2 mg daily with breakfast. Please call with progress report in 4 weeks.

## 2019-10-27 NOTE — Progress Notes (Addendum)
Presenting complaint;  For reevaluation of diarrhea.  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 Her last evaluation was in October 2016 when she had esophagogastroduodenoscopy as well as colonoscopy. EGD revealed small gastric pouch patent gastrojejunostomy and normal jejunal mucosa.  Biopsy from jejunal mucosa was normal. Colonoscopy revealed single diverticulum at splenic flexure.  Random biopsy from colon was negative for microscopic or collagenous colitis. She has taken dicyclomine in the past which has not worked to her satisfaction.  Lately she has been using Imodium OTC on as-needed basis.  She tells me that she has had diarrhea since childhood.  She has an average of 6-7 stools per day.  On her worst days she may have as many as 10 stools and her best days which is not often as 3 stools per day.  She says every time she eats she has a bowel movement within 15 to 30 minutes and it is mushy.  She generally has 2 more bowel movements and stool is loose and watery.  She denies nocturnal diarrhea.  She has never been constipated.  She denies melena or rectal bleeding.  She has not lost any weight since her last visit 1 year ago.  She weighed 296 pounds prior to bariatric surgery in 2012.  She dropped down to 177 pounds.  She has gradually gained some weight but lately it has been stable. Her appetite is fair.  She eats 4 small meals per day.  She experiences nausea and her heartburn if he tries to eat too much food.  She may also experience heartburn with greasy foods which she tries to avoid.  Patient states she takes Tylenol no more than 1300 mg daily.  She has chronic kidney disease and is followed by Dr. [name].  She also has coronary artery disease.  She says while she was sitting in the exam room she experienced chest pain  quickly relieved with nitroglycerin.  She did not experience diaphoresis lightheadedness palpitation.  Patient says she has had very few episodes of chest pain since she was begun on Isordil.  She did not feel the need to be sent to emergency room. Patient says she had B12 level checked by Dr. [name] couple of months ago and was normal. She had 4 cousins with celiac disease.  She has received both doses of Covid vaccine.   Current Medications: Outpatient Encounter Medications as of 10/27/2019  Medication Sig  . acetaminophen (TYLENOL) 325 MG tablet Take 2 tablets (650 mg total) by mouth every 4 (four) hours as needed for headache or mild pain.  Marland Kitchen allopurinol (ZYLOPRIM) 300 MG tablet Take 300 mg by mouth daily.   Marland Kitchen atenolol (TENORMIN) 50 MG tablet Take 50 mg by mouth 2 (two) times daily.  Marland Kitchen docusate sodium (COLACE) 100 MG capsule Take 100 mg by mouth daily.   . DULoxetine (CYMBALTA) 60 MG capsule Take 60 mg by mouth daily.    . furosemide (LASIX) 40 MG tablet Take 40 mg by mouth 2 (two) times daily.  . isosorbide dinitrate (ISORDIL) 10 MG tablet Take 2 tablets (20 mg total) by mouth in the morning. & 10 mg in the evening  . NITROSTAT 0.4 MG SL tablet Place 1 tablet (0.4 mg total) under the tongue as needed.  Marland Kitchen olmesartan (BENICAR) 40 MG tablet Take 1 tablet (40 mg total) by mouth daily.  Marland Kitchen  rosuvastatin (CRESTOR) 20 MG tablet Take 20 mg by mouth daily.  Marland Kitchen telmisartan (MICARDIS) 80 MG tablet Take 80 mg by mouth daily.   No facility-administered encounter medications on file as of 10/27/2019.   Past Medical History:  Diagnosis Date  . Anemia   . Anxiety   . Cervical cancer ([place])   . Chronic bronchitis ([place])    "get it q yr"  . Chronic lower back pain   . Depression   . Febrile seizure ([place])    "as a child"  . Fibromyalgia   . GERD (gastroesophageal reflux disease)   . Gout   . History of blood transfusion    "S/P tonsillectomy"  . History of hiatal hernia   . Hx of cardiovascular  stress test April 2016   false positive Myoview  . Hyperlipidemia   . Hypertension   . Migraine    "monthly" (05/26/2014)  . Neuropathy   . Osteoporosis   . Parathyroid disease ([place])    "my PHT levels run high; I can't take calcium"  . Pneumonia "several times"  . Renal cancer ([place])    "right"-s/p nephrectomy  . Renal insufficiency    "left kidney works at 40-60%" (05/26/2014)  . Sleep apnea    "wore mask for 2 months; could not take it" (05/26/2014)  . Type II diabetes mellitus ([place])   . Typhus fever    "as a child"   Past Surgical History:  Procedure Laterality Date  . ABDOMINAL HERNIA REPAIR  02/2013   "in [place]"  . ABDOMINAL HYSTERECTOMY  1977   "partial"  . APPENDECTOMY    . BACK SURGERY    . BILATERAL SALPINGOOPHORECTOMY Bilateral ~ 1993  . CARDIAC CATHETERIZATION  2003, April 2016   normal coronaries  . CATARACT EXTRACTION W/ INTRAOCULAR LENS  IMPLANT, BILATERAL  2015  . COLONOSCOPY N/A 11/25/2014   Procedure: COLONOSCOPY;  Surgeon: Rogene Houston, MD;  [facility];  Service: Endoscopy;  Laterality: N/A;  . CYSTOSCOPY W/ [place] MANIPULATION  1988  . DIAGNOSTIC LAPAROSCOPY  1975  . DILATION AND CURETTAGE OF UTERUS    . ESOPHAGOGASTRODUODENOSCOPY N/A 11/25/2014   Procedure: ESOPHAGOGASTRODUODENOSCOPY (EGD);  Surgeon: Rogene Houston, MD;  [facility];  Service: Endoscopy;  Laterality: N/A;  9:30 - moved to 11:25 - [name] to notify pt  . ESOPHAGOGASTRODUODENOSCOPY (EGD) WITH ESOPHAGEAL DILATION  1997  . EYE SURGERY    . GASTRIC BYPASS  2012  . HERNIA REPAIR     2015  . HIATAL HERNIA REPAIR  1996  . INCONTINENCE SURGERY  1983  . KNEE ARTHROSCOPY Left 2000's  . LAPAROSCOPIC CHOLECYSTECTOMY  1990's  . LEFT HEART CATHETERIZATION WITH CORONARY ANGIOGRAM N/A 05/27/2014   Procedure: LEFT HEART CATHETERIZATION WITH CORONARY ANGIOGRAM;  Surgeon: Lorretta Harp, MD;  Location: [facility] CATH LAB;  Service: Cardiovascular;  Laterality: N/A;  . LUMBAR [place]  SURGERY  2001   "herniated discs"  . [place]   "drilled into gum and put tooth implants uppers"  . NEPHRECTOMY Right 2002   "cancer"  . TMJ ARTHROPLASTY  1988  . TONSILLECTOMY  1976  . UPPER GI ENDOSCOPY        Objective: Blood pressure (!) 157/85, pulse 66, temperature 98.8 F (37.1 C), temperature source Oral, height $RemoveBefo'5\' 2"'ryWWIRxHvFL$  (1.575 m), weight 194 lb 4.8 oz (88.1 kg). Patient is alert and in no acute distress. She is wearing a mask. Conjunctiva is pink. Sclera is nonicteric Oropharyngeal mucosa is  normal. No neck masses or thyromegaly noted. Cardiac exam with regular rhythm normal S1 and S2. No murmur or gallop noted. Lungs are clear to auscultation. Abdomen is full.  She has long midline scar extending from epigastric region down to suprapubic area.  She has another scar in right subcostal region extending to right flank.  Bowel sounds are normal.  On palpation is soft and nontender with organomegaly or masses. No LE edema or clubbing noted.  Labs/studies Results:  CBC Latest Ref Rng & Units 11/06/2018 05/30/2017 02/17/2016  WBC 3.8 - 10.8 Thousand/uL 7.2 7.2 -  Hemoglobin 11.7 - 15.5 g/dL 13.1 10.9(L) 11.6(L)  Hematocrit 35 - 45 % 40.6 37.2 36.9  Platelets 140 - 400 Thousand/uL 215 228 -    CMP Latest Ref Rng & Units 10/15/2019 05/30/2017 07/05/2014  Glucose 70 - 99 mg/dL 127(H) 128(H) 112(H)  BUN 8 - 23 mg/dL 46(H) 26(H) 42(H)  Creatinine 0.44 - 1.00 mg/dL 1.92(H) 1.50(H) 2.40(H)  Sodium 135 - 145 mmol/L 137 140 138  Potassium 3.5 - 5.1 mmol/L 4.2 3.7 3.7  Chloride 98 - 111 mmol/L 104 103 104  CO2 22 - 32 mmol/L 24 26 25   Calcium 8.9 - 10.3 mg/dL 9.0 9.5 9.8  Total Protein 6.5 - 8.1 g/dL - 7.1 -  Total Bilirubin 0.3 - 1.2 mg/dL - 0.7 -  Alkaline Phos 38 - 126 U/L - 122 -  AST 15 - 41 U/L - 36 -  ALT 14 - 54 U/L - 25 -    Hepatic Function Latest Ref Rng & Units 05/30/2017  Total Protein 6.5 - 8.1 g/dL 7.1  Albumin 3.5 - 5.0 g/dL 3.5  AST 15 - 41 U/L 36  ALT 14  - 54 U/L 25  Alk Phosphatase 38 - 126 U/L 122  Total Bilirubin 0.3 - 1.2 mg/dL 0.7     Assessment:  #1.  Chronic diarrhea.  It appears she has had diarrhea for several years and long before she had bariatric surgery about 9 years ago.  She is maintaining her weight despite having anywhere from 3-10 bowel movements per day.  She did not respond to dicyclomine at a low dose.  She is using loperamide on as-needed basis which may or may not be helping. She had jejunal and colonic biopsies back in October 2016 revealing no abnormality. Differential diagnosis includes refractory irritable bowel syndrome, diarrhea due to dumping syndrome as well as diabetic diarrhea.  However diabetic diarrhea would not fit with her clinical course.  Doubt that she has chronic infection.  Based on her symptoms I doubt that she has malabsorptive syndrome but depending on her clinical course we may end up doing further work-up.  Plan:  Patient should stop taking Colace.  I did not delete this from her list of medications and will contact patient with this recommendation. Xifaxan 550 mg by mouth 3 times a day after each meal for 2 weeks. Patient advised to keep stool diary as to frequency and consistency of her stools for the next 4 weeks and send [place] a summary. If Xifaxan is not covered she would begin loperamide 2 mg daily with breakfast with second dose later in the day on as-needed basis. She will send [place] a summary in 4 weeks at which time we will make further recommendations.  Addendum  Patient states that if she does not take Colace sometimes her stool turns hard and she starts bleeding.

## 2019-10-28 ENCOUNTER — Ambulatory Visit (INDEPENDENT_AMBULATORY_CARE_PROVIDER_SITE_OTHER): Payer: Medicare Other | Admitting: Ophthalmology

## 2019-10-28 ENCOUNTER — Encounter (INDEPENDENT_AMBULATORY_CARE_PROVIDER_SITE_OTHER): Payer: Self-pay | Admitting: Ophthalmology

## 2019-10-28 DIAGNOSIS — Z794 Long term (current) use of insulin: Secondary | ICD-10-CM

## 2019-10-28 DIAGNOSIS — E113391 Type 2 diabetes mellitus with moderate nonproliferative diabetic retinopathy without macular edema, right eye: Secondary | ICD-10-CM | POA: Diagnosis not present

## 2019-10-28 DIAGNOSIS — H3561 Retinal hemorrhage, right eye: Secondary | ICD-10-CM | POA: Diagnosis not present

## 2019-10-28 DIAGNOSIS — E113392 Type 2 diabetes mellitus with moderate nonproliferative diabetic retinopathy without macular edema, left eye: Secondary | ICD-10-CM

## 2019-10-28 DIAGNOSIS — H35372 Puckering of macula, left eye: Secondary | ICD-10-CM | POA: Diagnosis not present

## 2019-10-28 NOTE — Progress Notes (Signed)
10/28/2019     CHIEF COMPLAINT Patient presents for Retina Follow Up   HISTORY OF PRESENT ILLNESS: Margaret Huerta is a 74 y.o. female who presents to the clinic today for:   HPI    Retina Follow Up    Patient presents with  Retinal Break/Detachment.  In both eyes.  Severity is moderate.  Duration of 6 months.  Since onset it is stable.  I, the attending physician,  performed the HPI with the patient and updated documentation appropriately.          Comments    6 Month NPDR f\u Ou. OCT  Pt states vision fluctuates due to medicines and BGL. Pt states no new changes or issues. BGL: 74 A1C: 6.4       Last edited by Tilda Franco on 10/28/2019  1:58 PM. (History)      Referring physician: [name], MD [address] [place],  [place] [address]  HISTORICAL INFORMATION:   Selected notes from the [facility]: No current outpatient medications on file. (Ophthalmic Drugs)   No current facility-administered medications for this visit. (Ophthalmic Drugs)   Current Outpatient Medications (Other)  Medication Sig  . acetaminophen (TYLENOL) 325 MG tablet Take 2 tablets (650 mg total) by mouth every 4 (four) hours as needed for headache or mild pain.  Marland Kitchen allopurinol (ZYLOPRIM) 300 MG tablet Take 300 mg by mouth daily.   Marland Kitchen atenolol (TENORMIN) 50 MG tablet Take 50 mg by mouth 2 (two) times daily.  Marland Kitchen docusate sodium (COLACE) 100 MG capsule Take 100 mg by mouth daily.   . DULoxetine (CYMBALTA) 60 MG capsule Take 60 mg by mouth daily.    . furosemide (LASIX) 40 MG tablet Take 40 mg by mouth 2 (two) times daily.  . isosorbide dinitrate (ISORDIL) 10 MG tablet Take 2 tablets (20 mg total) by mouth in the morning. & 10 mg in the evening  . loperamide (IMODIUM A-D) 2 MG tablet Take 0.5 tablets (1 mg total) by mouth daily with breakfast.  . NITROSTAT 0.4 MG SL tablet Place 1 tablet (0.4 mg total) under the tongue as needed.  Marland Kitchen olmesartan  (BENICAR) 40 MG tablet Take 1 tablet (40 mg total) by mouth daily.  . rifaximin (XIFAXAN) 550 MG TABS tablet Take 1 tablet (550 mg total) by mouth 3 (three) times daily.  . rosuvastatin (CRESTOR) 20 MG tablet Take 20 mg by mouth daily.  Marland Kitchen telmisartan (MICARDIS) 80 MG tablet Take 80 mg by mouth daily.   No current facility-administered medications for this visit. (Other)      REVIEW OF SYSTEMS: ROS    Positive for: Endocrine   Last edited by Tilda Franco on 10/28/2019  1:58 PM. (History)       ALLERGIES Allergies  Allergen Reactions  . Demerol [Meperidine] Shortness Of Breath    Stop breathing  . Hydrocodone-Acetaminophen Shortness Of Breath    REACTION: stop breathing  . Meperidine Hcl Shortness Of Breath    REACTION: stop breathing  . Vicodin [Hydrocodone-Acetaminophen] Shortness Of Breath    Stop breathing  . Ace Inhibitors Cough  . Amlodipine Swelling  . Codeine     REACTION: hallucination, confused  . Contrast Media [Iodinated Diagnostic Agents]     MRI dye. Shaky, nausea  . Fentanyl Itching  . Hydromorphone Hcl     REACTION: itching, rash, nausea, vomiting  . Morphine     REACTION: vomiting  .  Naproxen     REACTION: stomach lesion  . Dilaudid [Hydromorphone Hcl] Itching, Nausea And Vomiting and Rash    PAST MEDICAL HISTORY Past Medical History:  Diagnosis Date  . Anemia   . Anxiety   . Cervical cancer ([place])   . Chronic bronchitis ([place])    "get it q yr"  . Chronic lower back pain   . Depression   . Febrile seizure ([place])    "as a child"  . Fibromyalgia   . GERD (gastroesophageal reflux disease)   . Gout   . History of blood transfusion    "S/P tonsillectomy"  . History of hiatal hernia   . Hx of cardiovascular stress test April 2016   false positive Myoview  . Hyperlipidemia   . Hypertension   . Migraine    "monthly" (05/26/2014)  . Neuropathy   . Osteoporosis   . Parathyroid disease ([place])    "my PHT levels run high; I can't take calcium"   . Pneumonia "several times"  . Renal cancer ([place])    "right"-s/p nephrectomy  . Renal insufficiency    "left kidney works at 40-60%" (05/26/2014)  . Sleep apnea    "wore mask for 2 months; could not take it" (05/26/2014)  . Type II diabetes mellitus ([place])   . Typhus fever    "as a child"   Past Surgical History:  Procedure Laterality Date  . ABDOMINAL HERNIA REPAIR  02/2013   "in [place]"  . ABDOMINAL HYSTERECTOMY  1977   "partial"  . APPENDECTOMY    . BACK SURGERY    . BILATERAL SALPINGOOPHORECTOMY Bilateral ~ 1993  . CARDIAC CATHETERIZATION  2003, April 2016   normal coronaries  . CATARACT EXTRACTION W/ INTRAOCULAR LENS  IMPLANT, BILATERAL  2015  . COLONOSCOPY N/A 11/25/2014   Procedure: COLONOSCOPY;  Surgeon: Rogene Houston, MD;  [facility];  Service: Endoscopy;  Laterality: N/A;  . CYSTOSCOPY W/ [place] MANIPULATION  1988  . DIAGNOSTIC LAPAROSCOPY  1975  . DILATION AND CURETTAGE OF UTERUS    . ESOPHAGOGASTRODUODENOSCOPY N/A 11/25/2014   Procedure: ESOPHAGOGASTRODUODENOSCOPY (EGD);  Surgeon: Rogene Houston, MD;  [facility];  Service: Endoscopy;  Laterality: N/A;  9:30 - moved to 11:25 - [name] to notify pt  . ESOPHAGOGASTRODUODENOSCOPY (EGD) WITH ESOPHAGEAL DILATION  1997  . EYE SURGERY    . GASTRIC BYPASS  2012  . HERNIA REPAIR     2015  . HIATAL HERNIA REPAIR  1996  . INCONTINENCE SURGERY  1983  . KNEE ARTHROSCOPY Left 2000's  . LAPAROSCOPIC CHOLECYSTECTOMY  1990's  . LEFT HEART CATHETERIZATION WITH CORONARY ANGIOGRAM N/A 05/27/2014   Procedure: LEFT HEART CATHETERIZATION WITH CORONARY ANGIOGRAM;  Surgeon: Lorretta Harp, MD;  Location: [facility] CATH LAB;  Service: Cardiovascular;  Laterality: N/A;  . LUMBAR [place] SURGERY  2001   "herniated discs"  . [place]   "drilled into gum and put tooth implants uppers"  . NEPHRECTOMY Right 2002   "cancer"  . TMJ ARTHROPLASTY  1988  . TONSILLECTOMY  1976  . UPPER GI ENDOSCOPY      FAMILY  HISTORY Family History  Problem Relation Age of Onset  . Emphysema Father   . Heart disease Father   . Clotting disorder Mother   . Diabetes Mother   . Kidney disease Mother   . Allergies Other        whole family per pt  . Lung cancer Maternal Uncle   . Alcohol  abuse Maternal Uncle     SOCIAL HISTORY Social History   Tobacco Use  . Smoking status: Never Smoker  . Smokeless tobacco: Never Used  Vaping Use  . Vaping Use: Never used  Substance Use Topics  . Alcohol use: No    Alcohol/week: 0.0 standard drinks  . Drug use: No         OPHTHALMIC EXAM:  Base Eye Exam    Visual Acuity (Snellen - Linear)      Right Left   Dist cc 20/25 + 20/30 +   Correction: Glasses       Tonometry (Tonopen, 2:04 PM)      Right Left   Pressure 14 13       Pupils      Pupils Dark Light Shape React APD   Right PERRL 5 4 Round Brisk None   Left PERRL 5 4 Round Brisk None       Visual Fields (Counting fingers)      Left Right    Full Full       Neuro/Psych    Oriented x3: Yes   Mood/Affect: Normal       Dilation    Both eyes: 1.0% Mydriacyl, 2.5% Phenylephrine @ 2:04 PM        Slit Lamp and Fundus Exam    External Exam      Right Left   External Normal Normal       Slit Lamp Exam      Right Left   Lids/Lashes Normal Normal   Conjunctiva/Sclera White and quiet White and quiet   Cornea Clear Clear   Anterior Chamber Deep and quiet Deep and quiet   Iris Round and reactive Round and reactive   Lens Centered posterior chamber intraocular lens Centered posterior chamber intraocular lens   Anterior Vitreous Normal Normal       Fundus Exam      Right Left   Posterior Vitreous Normal Normal   Disc Normal Normal   C/D Ratio 0.3 0.3   Macula Microaneurysms, no clinically significant macular edema Microaneurysms, no clinically significant macular edema   Vessels NPDR- Moderate NPDR- Moderate   Periphery Normal Normal          IMAGING AND PROCEDURES  Imaging  and Procedures for 10/28/19  OCT, Retina - OU - Both Eyes       Right Eye Quality was good. Scan locations included subfoveal. Central Foveal Thickness: 238. Progression has been stable. Findings include normal foveal contour.   Left Eye Quality was good. Scan locations included subfoveal. Central Foveal Thickness: 254. Progression has been stable.   Notes OS with very subtle micro-CME possible, an old Schisis type changes temporally no change over time                ASSESSMENT/PLAN:  Left epiretinal membrane Is minor, will observe  Controlled type 2 diabetes mellitus with moderate nonproliferative retinopathy of right eye (HCC) The nature of moderate nonproliferative diabetic retinopathy was discussed with the patient as well as the need for more frequent follow up to judge for progression. Good blood glucose, blood pressure, and serum lipid control was recommended as well as avoidance of smoking and maintenance of normal weight.  Close follow up with PCP encouraged.  Able OU      ICD-10-CM   1. Left epiretinal membrane  H35.372 OCT, Retina - OU - Both Eyes  2. Retinal hemorrhage of right eye  H35.61   3. Controlled type 2  diabetes mellitus with left eye affected by moderate nonproliferative retinopathy without macular edema, with long-term current use of insulin (HCC)  E11.3392 OCT, Retina - OU - Both Eyes   Z79.4   4. Controlled type 2 diabetes mellitus with moderate nonproliferative retinopathy of right eye, with long-term current use of insulin, macular edema presence unspecified (HCC)  E11.3391 OCT, Retina - OU - Both Eyes   Z79.4     1.  OU, moderate NPDR, stabilized now the patient has achieved excellent blood sugar control with an A1c roughly in the mid sixes  2.  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  Observe OU  Ophthalmic Meds Ordered this visit:  No orders of the defined  types were placed in this encounter.      Return in about 9 months (around 07/27/2020) for DILATE OU, COLOR FP, OCT.  Patient Instructions  Patient to notify the office promptly if new visual acuity changes or distortions    Explained the diagnoses, plan, and follow up with the patient and they expressed understanding.  Patient expressed understanding of the importance of proper follow up care.   Clent Demark Arriyanna Mersch M.D. Diseases & Surgery of the Retina and Vitreous [facility]ina [facility] [place] 10/28/19     Abbreviations: M myopia (nearsighted); A astigmatism; H hyperopia (farsighted); P presbyopia; Mrx spectacle prescription;  CTL contact lenses; OD right eye; OS left eye; OU both eyes  XT exotropia; ET esotropia; PEK punctate epithelial keratitis; PEE punctate epithelial erosions; DES dry eye syndrome; MGD meibomian gland dysfunction; ATs artificial tears; PFAT's preservative free artificial tears; [place] nuclear sclerotic cataract; PSC posterior subcapsular cataract; ERM epi-retinal membrane; PVD posterior vitreous detachment; RD retinal detachment; DM diabetes mellitus; DR diabetic retinopathy; NPDR non-proliferative diabetic retinopathy; PDR proliferative diabetic retinopathy; CSME clinically significant macular edema; DME diabetic macular edema; dbh dot blot hemorrhages; CWS cotton wool spot; POAG primary open angle glaucoma; C/D cup-to-disc ratio; HVF humphrey visual field; GVF goldmann visual field; OCT optical coherence tomography; IOP intraocular pressure; BRVO Branch retinal vein occlusion; CRVO central retinal vein occlusion; CRAO central retinal artery occlusion; BRAO branch retinal artery occlusion; RT retinal tear; SB scleral buckle; PPV pars plana vitrectomy; VH Vitreous hemorrhage; PRP panretinal laser photocoagulation; IVK intravitreal kenalog; VMT vitreomacular traction; MH Macular hole;  NVD neovascularization of the disc; NVE neovascularization elsewhere; AREDS age related eye  disease study; ARMD age related macular degeneration; POAG primary open angle glaucoma; EBMD epithelial/anterior basement membrane dystrophy; ACIOL anterior chamber intraocular lens; IOL intraocular lens; PCIOL posterior chamber intraocular lens; Phaco/IOL phacoemulsification with intraocular lens placement; [place] photorefractive keratectomy; LASIK laser assisted in situ keratomileusis; HTN hypertension; DM diabetes mellitus; COPD chronic obstructive pulmonary disease

## 2019-10-28 NOTE — Assessment & Plan Note (Signed)
The nature of moderate nonproliferative diabetic retinopathy was discussed with the patient as well as the need for more frequent follow up to judge for progression. Good blood glucose, blood pressure, and serum lipid control was recommended as well as avoidance of smoking and maintenance of normal weight.  Close follow up with PCP encouraged.  Able OU

## 2019-10-28 NOTE — Patient Instructions (Signed)
Patient to notify the office promptly if new visual acuity changes or distortions

## 2019-10-28 NOTE — Assessment & Plan Note (Signed)
Is minor, will observe

## 2019-11-03 ENCOUNTER — Ambulatory Visit: Payer: Medicare Other | Admitting: Cardiology

## 2019-11-03 ENCOUNTER — Other Ambulatory Visit: Payer: Self-pay

## 2019-11-03 ENCOUNTER — Encounter: Payer: Self-pay | Admitting: Cardiology

## 2019-11-03 VITALS — BP 122/68 | HR 60 | Ht 62.0 in | Wt 191.0 lb

## 2019-11-03 DIAGNOSIS — E1121 Type 2 diabetes mellitus with diabetic nephropathy: Secondary | ICD-10-CM | POA: Diagnosis not present

## 2019-11-03 DIAGNOSIS — E782 Mixed hyperlipidemia: Secondary | ICD-10-CM | POA: Diagnosis not present

## 2019-11-03 DIAGNOSIS — N184 Chronic kidney disease, stage 4 (severe): Secondary | ICD-10-CM

## 2019-11-03 DIAGNOSIS — G4733 Obstructive sleep apnea (adult) (pediatric): Secondary | ICD-10-CM

## 2019-11-03 DIAGNOSIS — R001 Bradycardia, unspecified: Secondary | ICD-10-CM

## 2019-11-03 DIAGNOSIS — R5383 Other fatigue: Secondary | ICD-10-CM

## 2019-11-03 DIAGNOSIS — I10 Essential (primary) hypertension: Secondary | ICD-10-CM | POA: Diagnosis not present

## 2019-11-03 MED ORDER — HYDRALAZINE HCL 10 MG PO TABS
10.0000 mg | ORAL_TABLET | Freq: Three times a day (TID) | ORAL | 3 refills | Status: DC
Start: 2019-11-03 — End: 2019-11-17

## 2019-11-03 NOTE — Patient Instructions (Addendum)
Medication Instructions:  Your physician has recommended you make the following change in your medication:   Start Hydralazine 10 mg Three Times Daily   *If you need a refill on your cardiac medications before your next appointment, please call your pharmacy*   Lab Work: NONE   If you have labs (blood work) drawn today and your tests are completely normal, you will receive your results only by: Marland Kitchen MyChart Message (if you have MyChart) OR . A paper copy in the mail If you have any lab test that is abnormal or we need to change your treatment, we will call you to review the results.   Testing/Procedures: Your physician has requested that you have a renal artery duplex. During this test, an ultrasound is used to evaluate blood flow to the kidneys. Allow one hour for this exam. Do not eat after midnight the day before and avoid carbonated beverages. Take your medications as you usually do.     Follow-Up: At [facility], you and your health needs are our priority.  As part of our continuing mission to provide you with exceptional heart care, we have created designated Provider Care Teams.  These Care Teams include your primary Cardiologist (physician) and Advanced Practice Providers (APPs -  Physician Assistants and Nurse Practitioners) who all work together to provide you with the care you need, when you need it.  We recommend signing up for the patient portal called "MyChart".  Sign up information is provided on this After Visit Summary.  MyChart is used to connect with patients for Virtual Visits (Telemedicine).  Patients are able to view lab/test results, encounter notes, upcoming appointments, etc.  Non-urgent messages can be sent to your provider as well.   To learn more about what you can do with MyChart, go to [email or web address].    Your next appointment:   2 week(s)  The format for your next appointment:   In Person  Provider:   You will see one of the following  Advanced Practice Providers on your designated Care Team:    Bernerd Pho, PA-C   Ermalinda Barrios, PA-C     Other Instructions Thank you for choosing [facility]!

## 2019-11-03 NOTE — Progress Notes (Signed)
Cardiology Office Note   Date:  11/03/2019   ID:  Margaret Huerta, DOB January 19, 1946, MRN [id number]  PCP:  Margaret Noble, MD  Cardiologist:  Dr. [name]    Chief Complaint  Patient presents with   Hypertension      History of Present Illness: Margaret Huerta is a 74 y.o. female who presents for HTN  history of hypertension.  Hyperlipidemia, GERD, type 2 diabetes, hiatal hernia, fibromyalgia. OSA non - compliant with CPAP, status post right nephrectomy for renal cell CA. CKD Stage IV. Sees Dr [name].  Previous left heart cath in 2003 and 2016 for abnormal stress test.  Both catheterizations showed normal coronary arteries.  Had been seen in the past by Dr. [name] for syncope believed to be medication related.  Last encounter with Dr. [name] on 02/27/2019.  encouraged BP control walking and diet.    Telephone encounter 07/21/2019. Patient called stating she had chest pain the previous night with shoulder pain and headache that would not go away.  She did take a nitroglycerin.  She denied any shortness of breath or other symptoms.  She later spoke to clinical staff and stated she had not had a recurrence of chest pain.  Stated her systolic blood pressure had been running around 137-154.  She continued with chest pain and dyspnea and nuc study and echo were done. No ischemia and LV function normal with some stiffness.of LV but now unexpected with HTN.   last visit 08/10/19  Stress test and echocardiogram results were reviewed with her.  She verbalizes understanding.  States she continues to have some chest pain but improved since starting isosorbide dinitrate p.o. twice daily. telmisartan changed to Benicar.  She called with chest pain amlodipine added.    Today she could not take the amlodipine due to it causing edema of feet and lower ext and some of hands.  Overall her BP is improved but still elevated 17/69 to 198/83 though today well controlled.   She continues with fatigue has had Vit D  checked and B 12.  Normal.  Dr. [name] has said the Left renal disease could be playing a role in her HTN.  CKD -4 with last Cr 1.92  She is waking 1.5 miles a day or so, but sometimes unable to do that much.  She had been walking 5 miles per day.  We discussed reason for not increasing her atenolol, freq on BP check HR in 50s and low 50s at time.  At one point her BB was reduced due to syncope with bradycardia.    Past Medical History:  Diagnosis Date   Anemia    Anxiety    Cervical cancer ([place])    Chronic bronchitis ([place])    "get it q yr"   Chronic lower back pain    Depression    Febrile seizure ([place])    "as a child"   Fibromyalgia    GERD (gastroesophageal reflux disease)    Gout    History of blood transfusion    "S/P tonsillectomy"   History of hiatal hernia    Hx of cardiovascular stress test April 2016   false positive Myoview   Hyperlipidemia    Hypertension    Migraine    "monthly" (05/26/2014)   Neuropathy    Osteoporosis    Parathyroid disease ([place])    "my PHT levels run high; I can't take calcium"   Pneumonia "several times"   Renal cancer ([place])    "right"-s/p nephrectomy  Renal insufficiency    "left kidney works at 40-60%" (05/26/2014)   Sleep apnea    "wore mask for 2 months; could not take it" (05/26/2014)   Type II diabetes mellitus ([place])    Typhus fever    "as a child"    Past Surgical History:  Procedure Laterality Date   ABDOMINAL HERNIA REPAIR  02/2013   "in [place]"   Margaret Huerta   "partial"   [place] Bilateral ~ [place]  2003, April 2016   normal coronaries   CATARACT EXTRACTION W/ INTRAOCULAR LENS  IMPLANT, BILATERAL  2015   COLONOSCOPY N/A 11/25/2014   Procedure: COLONOSCOPY;  Surgeon: Rogene Houston, MD;  Location: [facility] ENDO [facility];  Service: Endoscopy;  Laterality: N/A;   CYSTOSCOPY W/ [place] MANIPULATION   1988   DIAGNOSTIC LAPAROSCOPY  1975   DILATION AND CURETTAGE OF UTERUS     ESOPHAGOGASTRODUODENOSCOPY N/A 11/25/2014   Procedure: ESOPHAGOGASTRODUODENOSCOPY (EGD);  Surgeon: Rogene Houston, MD;  Location: [facility] END[facility];  Service: Endoscopy;  Laterality: N/A;  9:30 - moved to 11:25 - [name] to notify pt   ESOPHAGOGASTRODUODENOSCOPY (EGD) [place]     GASTRIC BYPASS  2012   HERNIA REPAIR     2015   [place] ARTHROSCOPY Left 2000's   LAPAROSCOPIC CHOLECYSTECTOMY  1990's   LEFT HEART CATHETERIZATION WITH CORONARY ANGIOGRAM N/A 05/27/2014   Procedure: LEFT HEART CATHETERIZATION WITH CORONARY ANGIOGRAM;  Surgeon: Lorretta Harp, MD;  Location: [facility] CATH LAB;  Service: Cardiovascular;  Laterality: N/A;   LUMBAR [place] SURGERY  2001   "herniated discs"   [place]   "drilled into gum and put tooth implants uppers"   NEPHRECTOMY Right 2002   "cancer"   TMJ ARTHROPLASTY  1988   TONSILLECTOMY  1976   UPPER GI ENDOSCOPY       Current Outpatient Medications  Medication Sig Dispense Refill   acetaminophen (TYLENOL) 325 MG tablet Take 2 tablets (650 mg total) by mouth every 4 (four) hours as needed for headache or mild pain.     allopurinol (ZYLOPRIM) 300 MG tablet Take 300 mg by mouth daily.   12   atenolol (TENORMIN) 50 MG tablet Take 50 mg by mouth 2 (two) times daily.     docusate sodium (COLACE) 100 MG capsule Take 100 mg by mouth daily.      DULoxetine (CYMBALTA) 60 MG capsule Take 60 mg by mouth daily.       furosemide (LASIX) 40 MG tablet Take 40 mg by mouth 2 (two) times daily.     isosorbide dinitrate (ISORDIL) 10 MG tablet Take 2 tablets (20 mg total) by mouth in the morning. & 10 mg in the evening 270 tablet 3   loperamide (IMODIUM A-D) 2 MG tablet Take 0.5 tablets (1 mg total) by mouth daily with breakfast. 30 tablet 0   NITROSTAT 0.4 MG SL tablet Place 1  tablet (0.4 mg total) under the tongue as needed. 25 tablet 3   olmesartan (BENICAR) 40 MG tablet Take 1 tablet (40 mg total) by mouth daily. 90 tablet 2   rifaximin (XIFAXAN) 550 MG TABS tablet Take 1 tablet (550 mg total) by mouth 3 (three) times daily. 42 tablet 0   rosuvastatin (CRESTOR) 20 MG tablet Take 20 mg  by mouth daily.     hydrALAZINE (APRESOLINE) 10 MG tablet Take 1 tablet (10 mg total) by mouth 3 (three) times daily. 270 tablet 3   No current facility-administered medications for this visit.    Allergies:   Demerol [meperidine], Hydrocodone-acetaminophen, Meperidine hcl, Vicodin [hydrocodone-acetaminophen], Ace inhibitors, Amlodipine, Codeine, Contrast media [iodinated diagnostic agents], Fentanyl, Hydromorphone hcl, Morphine, Naproxen, and Dilaudid [hydromorphone hcl]    Social History:  The patient  reports that she has never smoked. She has never used smokeless tobacco. She reports that she does not drink alcohol and does not use drugs.   Family History:  The patient's family history includes Alcohol abuse in her maternal uncle; Allergies in an other family member; Clotting disorder in her mother; Diabetes in her mother; Emphysema in her father; Heart disease in her father; Kidney disease in her mother; Lung cancer in her maternal uncle.    ROS:  General:no colds or fevers, no weight changes Skin:no rashes or ulcers HEENT:no blurred vision, no congestion CV:see HPI PUL:see HPI GI:+ diarrhea no constipation or melena, no indigestion GU:no hematuria, no dysuria MS:no joint pain, no claudication Neuro:no syncope, no lightheadedness Endo:no diabetes, no thyroid disease  Wt Readings from Last 3 Encounters:  11/03/19 191 lb (86.6 kg)  10/27/19 194 lb 4.8 oz (88.1 kg)  08/10/19 192 lb 3.2 oz (87.2 kg)     PHYSICAL EXAM: VS:  BP 122/68    Pulse 60    Ht 5\' 2"  (1.575 m)    Wt 191 lb (86.6 kg)    SpO2 97%    BMI 34.93 kg/m  , BMI Body mass index is 34.93  kg/m. General:Pleasant affect, NAD Skin:Warm and dry, brisk capillary refill HEENT:normocephalic, sclera clear, mucus membranes moist Neck:supple, no JVD, no bruits  Heart:S1S2 RRR with soft systolic murmur, no gallup, rub or click Lungs:clear without rales, rhonchi, or wheezes VEH:MCNO, non tender, + BS, do not palpate liver spleen or masses Ext:no lower ext edema, 2+ pedal pulses, 2+ radial pulses Neuro:alert and oriented X 3, MAE, follows commands, + facial symmetry    EKG:  EKG is NOT ordered today.    Recent Labs: 11/06/2018: Hemoglobin 13.1; Platelets 215 10/15/2019: BUN 46; Creatinine, Ser 1.92; Potassium 4.2; Sodium 137    Lipid Panel No results found for: CHOL, TRIG, HDL, CHOLHDL, VLDL, LDLCALC, LDLDIRECT     Other studies Reviewed: Additional studies/ records that were reviewed today include: . nuc 08/05/19  There was no ST segment deviation noted during stress.  Findings consistent with mild apical ischemia.  This is a low risk study.  The left ventricular ejection fraction is hyperdynamic (>65%).  Echo 08/05/19 . Left ventricular ejection fraction, by estimation, is 60 to 65%. The  left ventricle has normal function. The left ventricle has no regional  wall motion abnormalities. There is mild left ventricular hypertrophy.  Left ventricular diastolic parameters  are consistent with Grade I diastolic dysfunction (impaired relaxation).  2. Right ventricular systolic function is normal. The right ventricular  size is normal. There is mildly elevated pulmonary artery systolic  pressure.  3. Left atrial size was severely dilated.  4. Right atrial size was mildly dilated.  5. The mitral valve is normal in structure. Mild mitral valve  regurgitation. No evidence of mitral stenosis.  6. The aortic valve is tricuspid. Aortic valve regurgitation is not  visualized. No aortic stenosis is present.  7. The inferior vena cava is normal in size with greater than  50%  respiratory variability,  suggesting right atrial pressure of 3 mmHg.  8. Left to right atrial shunting noted by color Doppler, probable PFO.   FINDINGS  Left Ventricle: Left ventricular ejection fraction, by estimation, is 60  to 65%. The left ventricle has normal function. The left ventricle has no  regional wall motion abnormalities. The left ventricular internal cavity  size was normal in size. There is  mild left ventricular hypertrophy. Left ventricular diastolic parameters  are consistent with Grade I diastolic dysfunction (impaired relaxation).  Normal left ventricular filling pressure.   Right Ventricle: The right ventricular size is normal. No increase in  right ventricular wall thickness. Right ventricular systolic function is  normal. There is mildly elevated pulmonary artery systolic pressure. The  tricuspid regurgitant velocity is 3.01  m/s, and with an assumed right atrial pressure of 3 mmHg, the estimated  right ventricular systolic pressure is 01.7 mmHg.   Left Atrium: Left atrial size was severely dilated.   Right Atrium: Right atrial size was mildly dilated.   Pericardium: There is no evidence of pericardial effusion.   Mitral Valve: The mitral valve is normal in structure. There is mild  thickening of the mitral valve leaflet(s). There is mild calcification of  the mitral valve leaflet(s). Mild mitral annular calcification. Mild  mitral valve regurgitation. No evidence of  mitral valve stenosis.   Tricuspid Valve: The tricuspid valve is not well visualized. Tricuspid  valve regurgitation is mild . No evidence of tricuspid stenosis.   Aortic Valve: The aortic valve is tricuspid. . There is mild thickening  and mild calcification of the aortic valve. Aortic valve regurgitation is  not visualized. No aortic stenosis is present. Mild aortic valve annular  calcification. There is mild  thickening of the aortic valve. There is mild calcification of the  aortic  valve. Aortic valve mean gradient measures 4.8 mmHg. Aortic valve peak  gradient measures 9.5 mmHg. Aortic valve area, by VTI measures 2.21 cm.   Pulmonic Valve: The pulmonic valve was not well visualized. Pulmonic valve  regurgitation is mild. No evidence of pulmonic stenosis.   Aorta: The aortic root is normal in size and structure.   Pulmonary Artery: Mild pulmonary HTN, PASP is 39 mmHg.   Venous: The inferior vena cava is normal in size with greater than 50%  respiratory variability, suggesting right atrial pressure of 3 mmHg.   IAS/Shunts: Left to right atrial shunting noted by color Doppler, probable  PFO.   ASSESSMENT AND PLAN:  1.  HTN not well controlled, unable to take amlodipine.  Will add hydralazine low dose to begin 10 mg TID with plan if needed to increase to 25 TID at least.  Also I reviewed MRIs and CT of abd to see if Renal artery on left mentioned.  It was not.  Will do renal duplex doppler Lt renal artery.    2.  Fatigue have recommended TSH with next labs with PCP.  Also possible chronotropic incompetence on the BB.  If symptoms not improved with BP control thought for 30 day event monitor for bradycardia burden and possible ETT to evaluate HR with exertion.     3.  Hx of chest pain with normal cors in past and recent nuc study neg for ischemia.  Echo was stable as well.    4.  CKD-4 with single Lt kidney, Rt nephrectomy for cancer.  Followed by nephrology   5.  Sleep apnea and could not wear CPAP  6.  HLD on crestor continue  7.  Cramps in toes more than usual. May need mg+ checked on next labs with PCP her Na and K+ have been normal.  8.  DM-2 per PCP.     Current medicines are reviewed with the patient today.  The patient Has no concerns regarding medicines.  The following changes have been made:  See above Labs/ tests ordered today include:see above  Disposition:   FU:  see above  Signed, Cecilie Kicks, NP  11/03/2019 3:36 PM    [facility] [place], [place], [place] [place], [place] Phone: [phone]; Fax: [phone]

## 2019-11-05 DIAGNOSIS — E1129 Type 2 diabetes mellitus with other diabetic kidney complication: Secondary | ICD-10-CM | POA: Diagnosis not present

## 2019-11-05 DIAGNOSIS — N189 Chronic kidney disease, unspecified: Secondary | ICD-10-CM | POA: Diagnosis not present

## 2019-11-05 DIAGNOSIS — E211 Secondary hyperparathyroidism, not elsewhere classified: Secondary | ICD-10-CM | POA: Diagnosis not present

## 2019-11-05 DIAGNOSIS — N184 Chronic kidney disease, stage 4 (severe): Secondary | ICD-10-CM | POA: Diagnosis not present

## 2019-11-05 DIAGNOSIS — R809 Proteinuria, unspecified: Secondary | ICD-10-CM | POA: Diagnosis not present

## 2019-11-05 DIAGNOSIS — E039 Hypothyroidism, unspecified: Secondary | ICD-10-CM | POA: Diagnosis not present

## 2019-11-05 DIAGNOSIS — E1122 Type 2 diabetes mellitus with diabetic chronic kidney disease: Secondary | ICD-10-CM | POA: Diagnosis not present

## 2019-11-12 DIAGNOSIS — I129 Hypertensive chronic kidney disease with stage 1 through stage 4 chronic kidney disease, or unspecified chronic kidney disease: Secondary | ICD-10-CM | POA: Diagnosis not present

## 2019-11-12 DIAGNOSIS — E211 Secondary hyperparathyroidism, not elsewhere classified: Secondary | ICD-10-CM | POA: Diagnosis not present

## 2019-11-12 DIAGNOSIS — R809 Proteinuria, unspecified: Secondary | ICD-10-CM | POA: Diagnosis not present

## 2019-11-12 DIAGNOSIS — N189 Chronic kidney disease, unspecified: Secondary | ICD-10-CM | POA: Diagnosis not present

## 2019-11-12 DIAGNOSIS — I5032 Chronic diastolic (congestive) heart failure: Secondary | ICD-10-CM | POA: Diagnosis not present

## 2019-11-13 ENCOUNTER — Other Ambulatory Visit (HOSPITAL_COMMUNITY): Payer: Self-pay | Admitting: Internal Medicine

## 2019-11-13 DIAGNOSIS — Z1231 Encounter for screening mammogram for malignant neoplasm of breast: Secondary | ICD-10-CM

## 2019-11-17 ENCOUNTER — Ambulatory Visit: Payer: Medicare Other | Admitting: Family Medicine

## 2019-11-17 ENCOUNTER — Encounter: Payer: Self-pay | Admitting: Family Medicine

## 2019-11-17 VITALS — BP 148/88 | HR 64 | Ht 62.0 in | Wt 195.6 lb

## 2019-11-17 DIAGNOSIS — G4733 Obstructive sleep apnea (adult) (pediatric): Secondary | ICD-10-CM

## 2019-11-17 DIAGNOSIS — N184 Chronic kidney disease, stage 4 (severe): Secondary | ICD-10-CM | POA: Diagnosis not present

## 2019-11-17 DIAGNOSIS — I10 Essential (primary) hypertension: Secondary | ICD-10-CM | POA: Diagnosis not present

## 2019-11-17 DIAGNOSIS — E782 Mixed hyperlipidemia: Secondary | ICD-10-CM | POA: Diagnosis not present

## 2019-11-17 MED ORDER — HYDRALAZINE HCL 25 MG PO TABS: 25.0000 mg | ORAL_TABLET | Freq: Two times a day (BID) | ORAL | 6 refills | Status: DC

## 2019-11-17 NOTE — Progress Notes (Signed)
Cardiology Office Note  Date: 11/17/2019   ID: Margaret Huerta, DOB [birth year], MRN [id number]  PCP:  [name], MD  Cardiologist:  Carlyle Dolly, MD Electrophysiologist:  None   Chief Complaint: Follow-up HTN, HLD, OSA, fatigue, bradycardia  History of Present Illness: Margaret Huerta is a 74 y.o. female with a history of hypertension, type II DM, HLD, OSA, fatigue, bradycardia, CKD stage IV (status post right nephrectomy for renal CA) sees nephrology Dr. [name] . Previous cardiac catheterizations in 2003 and 2016 for abnormal stress test.  Both catheterization showed normal coronary arteries. Previously seen by Dr. [name] for syncope believed to be medication related.  Previous encounter with Dr. [name] on 02/27/2019.  She encouraged BP control and walking with dietary modifications. Patient called on 07/21/2019 stating she had chest pain the previous night with shoulder pain and headache.  She took a nitroglycerin.  Later spoke with staff and stated chest pain had not recurred.  Her systolic blood pressures were running 137-154.  She later continued with chest pain and dyspnea.  Nuclear stress study and echocardiogram were performed.  There was no evidence of ischemia and she had normal LV function was normal.  With some diastolic dysfunction.  On previous visit 07/21/2019 stress test and echocardiogram results were reviewed with her.  She continued to have some chest pain but improved after starting isosorbide dinitrate p.o. twice daily.  Telmisartan was changed to Benicar.  She later called with chest pain and amlodipine was added.  At last visit with Cecilie Kicks, NP on 11/02/2019 stated she could no longer take amlodipine due to causing edema of feet and lower extremities.  Blood pressures continued to be elevated at 983J to 825K systolic but during the visit her blood pressure was under control. Dr. [name] stated the left renal disease could be playing a role in her hypertension.  She was  walking 1.5 miles a day but able sometimes not to do that much.  Had previously been walking 5 miles per day.  Margaret Huerta had started her on low-dose hydralazine 10 mg p.o. 3 times daily with plans to increase as needed.  She also schedule a renal duplex to check for renal artery stenosis of her left renal artery.  She presents today stating she does not feel the hydralazine has affected her blood pressure in any meaningful way.  Blood pressure on arrival today was 148/88.  She denies any anginal or exertional symptoms, palpitations or arrhythmias, orthostatic symptoms,, PND, orthopnea, CVA or TIA-like symptoms.  Continues with complaints of  fatigue. Denies any claudication-like symptoms, DVT or PE-like symptoms or lower extremity edema.  She has a lot of stress occurring recently with recent death in the family as well as Covid infection in several family members.    Past Medical History:  Diagnosis Date  . Anemia   . Anxiety   . Cervical cancer ([place])   . Chronic bronchitis ([place])    "get it q yr"  . Chronic lower back pain   . Depression   . Febrile seizure ([place])    "as a child"  . Fibromyalgia   . GERD (gastroesophageal reflux disease)   . Gout   . History of blood transfusion    "S/P tonsillectomy"  . History of hiatal hernia   . Hx of cardiovascular stress test April 2016   false positive Myoview  . Hyperlipidemia   . Hypertension   . Migraine    "monthly" (05/26/2014)  . Neuropathy   .  Osteoporosis   . Parathyroid disease ([place])    "my PHT levels run high; I can't take calcium"  . Pneumonia "several times"  . Renal cancer ([place])    "right"-s/p nephrectomy  . Renal insufficiency    "left kidney works at 40-60%" (05/26/2014)  . Sleep apnea    "wore mask for 2 months; could not take it" (05/26/2014)  . Type II diabetes mellitus ([place])   . Typhus fever    "as a child"    Past Surgical History:  Procedure Laterality Date  . ABDOMINAL HERNIA REPAIR  02/2013   "in [place]"    . ABDOMINAL HYSTERECTOMY  1977   "partial"  . APPENDECTOMY    . BACK SURGERY    . BILATERAL SALPINGOOPHORECTOMY Bilateral ~ 1993  . CARDIAC CATHETERIZATION  2003, April 2016   normal coronaries  . CATARACT EXTRACTION W/ INTRAOCULAR LENS  IMPLANT, BILATERAL  2015  . COLONOSCOPY N/A 11/25/2014   Procedure: COLONOSCOPY;  Surgeon: Rogene Houston, MD;  Location: [facility];  Service: Endoscopy;  Laterality: N/A;  . CYSTOSCOPY W/ [place] MANIPULATION  1988  . DIAGNOSTIC LAPAROSCOPY  1975  . DILATION AND CURETTAGE OF UTERUS    . ESOPHAGOGASTRODUODENOSCOPY N/A 11/25/2014   Procedure: ESOPHAGOGASTRODUODENOSCOPY (EGD);  Surgeon: Rogene Houston, MD;  Location: [facility];  Service: Endoscopy;  Laterality: N/A;  9:30 - moved to 11:25 - [name] to notify pt  . ESOPHAGOGASTRODUODENOSCOPY (EGD) WITH ESOPHAGEAL DILATION  1997  . EYE SURGERY    . GASTRIC BYPASS  2012  . HERNIA REPAIR     2015  . HIATAL HERNIA REPAIR  1996  . INCONTINENCE SURGERY  1983  . KNEE ARTHROSCOPY Left 2000's  . LAPAROSCOPIC CHOLECYSTECTOMY  1990's  . LEFT HEART CATHETERIZATION WITH CORONARY ANGIOGRAM N/A 05/27/2014   Procedure: LEFT HEART CATHETERIZATION WITH CORONARY ANGIOGRAM;  Surgeon: Lorretta Harp, MD;  Location: [facility] CATH LAB;  Service: Cardiovascular;  Laterality: N/A;  . LUMBAR [place] SURGERY  2001   "herniated discs"  . [place]   "drilled into gum and put tooth implants uppers"  . NEPHRECTOMY Right 2002   "cancer"  . TMJ ARTHROPLASTY  1988  . TONSILLECTOMY  1976  . UPPER GI ENDOSCOPY      Current Outpatient Medications  Medication Sig Dispense Refill  . acetaminophen (TYLENOL) 325 MG tablet Take 2 tablets (650 mg total) by mouth every 4 (four) hours as needed for headache or mild pain.    Marland Kitchen allopurinol (ZYLOPRIM) 300 MG tablet Take 300 mg by mouth daily.   12  . atenolol (TENORMIN) 50 MG tablet Take 50 mg by mouth 2 (two) times daily.    Marland Kitchen docusate sodium (COLACE) 100 MG capsule Take 100 mg by  mouth daily.     . DULoxetine (CYMBALTA) 60 MG capsule Take 60 mg by mouth daily.      . furosemide (LASIX) 40 MG tablet Take 40 mg by mouth 2 (two) times daily.    . hydrALAZINE (APRESOLINE) 25 MG tablet Take 1 tablet (25 mg total) by mouth 2 (two) times daily. 60 tablet 6  . isosorbide dinitrate (ISORDIL) 10 MG tablet Take 2 tablets (20 mg total) by mouth in the morning. & 10 mg in the evening 270 tablet 3  . loperamide (IMODIUM A-D) 2 MG tablet Take 0.5 tablets (1 mg total) by mouth daily with breakfast. 30 tablet 0  . NITROSTAT 0.4 MG SL tablet Place 1 tablet (0.4 mg total) under the  tongue as needed. 25 tablet 3  . olmesartan (BENICAR) 40 MG tablet Take 1 tablet (40 mg total) by mouth daily. 90 tablet 2  . rifaximin (XIFAXAN) 550 MG TABS tablet Take 1 tablet (550 mg total) by mouth 3 (three) times daily. 42 tablet 0  . rosuvastatin (CRESTOR) 20 MG tablet Take 20 mg by mouth daily.     No current facility-administered medications for this visit.   Allergies:  Demerol [meperidine], Hydrocodone-acetaminophen, Meperidine hcl, Vicodin [hydrocodone-acetaminophen], Ace inhibitors, Amlodipine, Codeine, Contrast media [iodinated diagnostic agents], Fentanyl, Hydromorphone hcl, Morphine, Naproxen, and Dilaudid [hydromorphone hcl]   Social History: The patient  reports that she has never smoked. She has never used smokeless tobacco. She reports that she does not drink alcohol and does not use drugs.   Family History: The patient's family history includes Alcohol abuse in her maternal uncle; Allergies in an other family member; Clotting disorder in her mother; Diabetes in her mother; Emphysema in her father; Heart disease in her father; Kidney disease in her mother; Lung cancer in her maternal uncle.   ROS:  Please see the history of present illness. Otherwise, complete review of systems is positive for none.  All other systems are reviewed and negative.   Physical Exam: VS:  BP (!) 148/88   Pulse 64    Ht 5\' 2"  (1.575 m)   Wt 195 lb 9.6 oz (88.7 kg)   SpO2 96%   BMI 35.78 kg/m , BMI Body mass index is 35.78 kg/m.  Wt Readings from Last 3 Encounters:  11/17/19 195 lb 9.6 oz (88.7 kg)  11/03/19 191 lb (86.6 kg)  10/27/19 194 lb 4.8 oz (88.1 kg)    General: Patient appears comfortable at rest. Neck: Supple, no elevated JVP or carotid bruits, no thyromegaly. Lungs: Clear to auscultation, nonlabored breathing at rest. Cardiac: Regular rate and rhythm, no S3 or significant systolic murmur, no pericardial rub. Extremities: No pitting edema, distal pulses 2+. Skin: Warm and dry. Musculoskeletal: No kyphosis. Neuropsychiatric: Alert and oriented x3, affect grossly appropriate.  ECG:  EKG July 24, 2019: EKG shows normal sinus rhythm rate 63.  Recent Labwork: 10/15/2019: BUN 46; Creatinine, Ser 1.92; Potassium 4.2; Sodium 137  No results found for: CHOL, TRIG, HDL, CHOLHDL, VLDL, LDLCALC, LDLDIRECT  Other Studies Reviewed Today:  Additional studies/ records that were reviewed today include:   Nuclear stress test 08/05/2019  There was no ST segment deviation noted during stress.  Findings consistent with mild apical ischemia.  This is a low risk study.  The left ventricular ejection fraction is hyperdynamic (>65%).  Echo 08/05/19 . Left ventricular ejection fraction, by estimation, is 60 to 65%. The  left ventricle has normal function. The left ventricle has no regional  wall motion abnormalities. There is mild left ventricular hypertrophy.  Left ventricular diastolic parameters  are consistent with Grade I diastolic dysfunction (impaired relaxation).  2. Right ventricular systolic function is normal. The right ventricular  size is normal. There is mildly elevated pulmonary artery systolic  pressure.  3. Left atrial size was severely dilated.  4. Right atrial size was mildly dilated.  5. The mitral valve is normal in structure. Mild mitral valve  regurgitation. No  evidence of mitral stenosis.  6. The aortic valve is tricuspid. Aortic valve regurgitation is not  visualized. No aortic stenosis is present.  7. The inferior vena cava is normal in size with greater than 50%  respiratory variability, suggesting right atrial pressure of 3 mmHg.  8. Left  to right atrial shunting noted by color Doppler, probable PFO.    Assessment and Plan:  1. Essential hypertension   2. Chronic kidney disease (CKD), stage IV (severe) ([place])   3. OSA (obstructive sleep apnea)   4. Mixed hyperlipidemia    1. Essential hypertension Patient is here for 2-week follow-up after being started on low-dose hydralazine.  Blood pressure today on arrival was 148/88.  Patient states she does not feel  current hydralazine dose is having any great effect on her blood pressure.  Margaret Huerta has ordered a renal artery duplex for left renal artery to check for renal artery stenosis.  Increase hydralazine to 25 mg p.o. twice daily for now.  Check blood pressures for 2 weeks and come back for nursing visit in 2 weeks with a log of blood pressures after increasing dosage for blood pressure recheck.  If blood pressure remains elevated may increase dosage and/or change dosage intervals to 3 times daily  2. Chronic kidney disease (CKD), stage IV (severe) ([facility]) Sees Dr. [name].  Apparently Dr. [name] had mentioned in the past renal disease combined with a solitary kidney could be affecting blood pressure control.  Margaret Huerta has scheduled a left renal artery ultrasound to check for renal artery stenosis.  Last creatinine on 10/15/2019 was 1.9 with GFR of 26  3. OSA (obstructive sleep apnea) History of sleep apnea.  However could not tolerate wearing CPAP.  This may be contributing some to her fatigue secondary to sleep deprivation.  4. Mixed hyperlipidemia Continue rosuvastatin 20 mg daily.  No recent lab work available from PCP.  Medication Adjustments/Labs and Tests Ordered: Current medicines  are reviewed at length with the patient today.  Concerns regarding medicines are outlined above.   Disposition: Follow-up with Dr. Harl Bowie or APP in [place], [place] July, NP 11/17/2019 4:18 PM    [facility] at [place], [place], [place] [address] Phone: [phone]; Fax: [phone]

## 2019-11-17 NOTE — Patient Instructions (Addendum)
Medication Instructions:   Increase Hydralazine to 25mg  twice a day.  Continue all other medications.    Labwork: none  Testing/Procedures: none  Follow-Up: 3 months   Any Other Special Instructions Will Be Listed Below (If Applicable).  Your physician has requested that you regularly monitor and record your blood pressure readings at home x 2 weeks every other day. Please use the same machine at the same time of day to check your readings and record them to bring to nurse visit for provider review.    Nurse visit in 2 weeks to recheck blood pressure  If you need a refill on your cardiac medications before your next appointment, please call your pharmacy.

## 2019-11-18 ENCOUNTER — Ambulatory Visit (INDEPENDENT_AMBULATORY_CARE_PROVIDER_SITE_OTHER): Payer: Medicare Other

## 2019-11-18 DIAGNOSIS — I10 Essential (primary) hypertension: Secondary | ICD-10-CM

## 2019-11-20 DIAGNOSIS — N184 Chronic kidney disease, stage 4 (severe): Secondary | ICD-10-CM | POA: Diagnosis not present

## 2019-11-20 DIAGNOSIS — E785 Hyperlipidemia, unspecified: Secondary | ICD-10-CM | POA: Diagnosis not present

## 2019-11-20 DIAGNOSIS — E1122 Type 2 diabetes mellitus with diabetic chronic kidney disease: Secondary | ICD-10-CM | POA: Diagnosis not present

## 2019-11-20 DIAGNOSIS — R7309 Other abnormal glucose: Secondary | ICD-10-CM | POA: Diagnosis not present

## 2019-12-01 ENCOUNTER — Ambulatory Visit: Payer: Medicare Other | Admitting: *Deleted

## 2019-12-01 VITALS — BP 118/62 | HR 47

## 2019-12-01 DIAGNOSIS — I10 Essential (primary) hypertension: Secondary | ICD-10-CM

## 2019-12-01 MED ORDER — ROSUVASTATIN CALCIUM 10 MG PO TABS: 10.0000 mg | ORAL_TABLET | Freq: Every day | ORAL | Status: AC

## 2019-12-01 NOTE — Progress Notes (Signed)
Dr. Willey Blade reduced Crestor from 20mg  down to 10mg .    In office this morning for BP check.  Recent increase on Hydralazine to 25mg  twice a day.    See BP log - placed on your desk.   States that she has electronic cuff that she uses at home.   States that she has been going through a lot of stress with sick family members the last month as well.    No other complaints at this time.

## 2019-12-03 NOTE — Progress Notes (Signed)
Based on recent BP log tell her to increase Hydralazine to 50 mg po bid. And continue checking her blood pressures. Goal is 130/80 or less. In two weeks have her come back for BP check after increasing the dose. We may need to change to TID dosing if the BP does not come down on BID dosing, Thanks

## 2019-12-07 NOTE — Progress Notes (Signed)
Patient notified.  States that she is not sure about increasing the Hydralazine as her numbers have been looking better.    10/20 - 137/73  52 10/21 - 127/63  54 10/22 -114/63  57 10/23 - 122/62  51  10/24 - 126/59  55 10/25 - 138/62  51  Will fwd back to provider for further advice.

## 2019-12-08 DIAGNOSIS — Z23 Encounter for immunization: Secondary | ICD-10-CM | POA: Diagnosis not present

## 2019-12-09 NOTE — Progress Notes (Signed)
Husband Shanon Brow) notified.

## 2019-12-10 ENCOUNTER — Other Ambulatory Visit (INDEPENDENT_AMBULATORY_CARE_PROVIDER_SITE_OTHER): Payer: Self-pay | Admitting: Internal Medicine

## 2019-12-10 ENCOUNTER — Telehealth (INDEPENDENT_AMBULATORY_CARE_PROVIDER_SITE_OTHER): Payer: Self-pay | Admitting: Internal Medicine

## 2019-12-10 NOTE — Telephone Encounter (Signed)
I reviewed patient's stool diary which she kept for 30 days.  She is having anywhere from loose to solid stools. Only on 1 day she has gained stools which was on 11/23/2019.  Patient says she went to a wedding for diarrhea after that. She has been taking OTC Imodium 2 mg on as needed basis Total of 6 days when she did not have a bowel movement.  These are not consecutive or even to in a row. Had 3 days that she had 4 bowel movements daily Anywhere from 1-3 bowel movements daily for total of 19 days. She did not take Xifaxan because co-pay was $500. Lately she is not having nocturnal bowel movement. She feels that she is doing better with Imodium   Advised to take Imodium 2 mg daily for 1 mg twice daily. Patient also take 2 mg a day 1) skip a day 3. It appears she is getting plenty of fiber in her diet and does not need a supplement. Patient will call if symptoms change.  Otherwise she will return for follow-up visit in 6 months.

## 2019-12-17 ENCOUNTER — Other Ambulatory Visit: Payer: Self-pay

## 2019-12-17 ENCOUNTER — Ambulatory Visit (HOSPITAL_COMMUNITY)
Admission: RE | Admit: 2019-12-17 | Discharge: 2019-12-17 | Disposition: A | Discharge status: Home / Self Care | Payer: Medicare Other | Source: Ambulatory Visit | Attending: Internal Medicine | Admitting: Internal Medicine

## 2019-12-17 DIAGNOSIS — Z1231 Encounter for screening mammogram for malignant neoplasm of breast: Secondary | ICD-10-CM | POA: Insufficient documentation

## 2019-12-28 NOTE — Telephone Encounter (Signed)
error 

## 2019-12-30 ENCOUNTER — Other Ambulatory Visit: Payer: Self-pay

## 2019-12-30 ENCOUNTER — Ambulatory Visit (HOSPITAL_COMMUNITY)
Admission: RE | Admit: 2019-12-30 | Discharge: 2019-12-30 | Disposition: A | Discharge status: Home / Self Care | Payer: Medicare Other | Source: Ambulatory Visit | Attending: Internal Medicine | Admitting: Internal Medicine

## 2019-12-30 DIAGNOSIS — Z1231 Encounter for screening mammogram for malignant neoplasm of breast: Secondary | ICD-10-CM | POA: Insufficient documentation

## 2019-12-31 ENCOUNTER — Ambulatory Visit: Payer: Medicare Other | Attending: Internal Medicine

## 2019-12-31 DIAGNOSIS — Z23 Encounter for immunization: Secondary | ICD-10-CM

## 2019-12-31 NOTE — Progress Notes (Signed)
   Covid-19 Vaccination Clinic  Name:  Margaret Huerta    MRN: [id number] DOB: 09/08/1945  12/31/2019  Margaret Huerta was observed post Covid-19 immunization for 30 minutes based on pre-vaccination screening without incident. She was provided with Vaccine Information Sheet and instruction to access the V-Safe system.   Margaret Huerta was instructed to call 911 with any severe reactions post vaccine: Marland Kitchen Difficulty breathing  . Swelling of face and throat  . A fast heartbeat  . A bad rash all over body  . Dizziness and weakness   Immunizations Administered    Name Date Dose VIS Date Route   Pfizer COVID-19 Vaccine 12/31/2019  2:28 PM 0.3 mL 12/02/2019 Intramuscular   Manufacturer: [place]   Lot: [id number]   [place]: [id number]

## 2020-01-11 DIAGNOSIS — E1122 Type 2 diabetes mellitus with diabetic chronic kidney disease: Secondary | ICD-10-CM | POA: Diagnosis not present

## 2020-01-11 DIAGNOSIS — I5032 Chronic diastolic (congestive) heart failure: Secondary | ICD-10-CM | POA: Diagnosis not present

## 2020-01-11 DIAGNOSIS — N189 Chronic kidney disease, unspecified: Secondary | ICD-10-CM | POA: Diagnosis not present

## 2020-01-11 DIAGNOSIS — I129 Hypertensive chronic kidney disease with stage 1 through stage 4 chronic kidney disease, or unspecified chronic kidney disease: Secondary | ICD-10-CM | POA: Diagnosis not present

## 2020-01-11 DIAGNOSIS — E211 Secondary hyperparathyroidism, not elsewhere classified: Secondary | ICD-10-CM | POA: Diagnosis not present

## 2020-01-14 DIAGNOSIS — H26491 Other secondary cataract, right eye: Secondary | ICD-10-CM | POA: Diagnosis not present

## 2020-01-14 DIAGNOSIS — H10413 Chronic giant papillary conjunctivitis, bilateral: Secondary | ICD-10-CM | POA: Diagnosis not present

## 2020-01-14 DIAGNOSIS — E113293 Type 2 diabetes mellitus with mild nonproliferative diabetic retinopathy without macular edema, bilateral: Secondary | ICD-10-CM | POA: Diagnosis not present

## 2020-01-14 DIAGNOSIS — Z961 Presence of intraocular lens: Secondary | ICD-10-CM | POA: Diagnosis not present

## 2020-01-14 DIAGNOSIS — H43812 Vitreous degeneration, left eye: Secondary | ICD-10-CM | POA: Diagnosis not present

## 2020-01-15 DIAGNOSIS — I5032 Chronic diastolic (congestive) heart failure: Secondary | ICD-10-CM | POA: Diagnosis not present

## 2020-01-15 DIAGNOSIS — R809 Proteinuria, unspecified: Secondary | ICD-10-CM | POA: Diagnosis not present

## 2020-01-15 DIAGNOSIS — N189 Chronic kidney disease, unspecified: Secondary | ICD-10-CM | POA: Diagnosis not present

## 2020-01-15 DIAGNOSIS — I129 Hypertensive chronic kidney disease with stage 1 through stage 4 chronic kidney disease, or unspecified chronic kidney disease: Secondary | ICD-10-CM | POA: Diagnosis not present

## 2020-01-15 DIAGNOSIS — E211 Secondary hyperparathyroidism, not elsewhere classified: Secondary | ICD-10-CM | POA: Diagnosis not present

## 2020-02-16 DIAGNOSIS — E1129 Type 2 diabetes mellitus with other diabetic kidney complication: Secondary | ICD-10-CM | POA: Diagnosis not present

## 2020-02-22 DIAGNOSIS — R7309 Other abnormal glucose: Secondary | ICD-10-CM | POA: Diagnosis not present

## 2020-02-22 DIAGNOSIS — I10 Essential (primary) hypertension: Secondary | ICD-10-CM | POA: Diagnosis not present

## 2020-02-22 DIAGNOSIS — N184 Chronic kidney disease, stage 4 (severe): Secondary | ICD-10-CM | POA: Diagnosis not present

## 2020-02-22 DIAGNOSIS — E1129 Type 2 diabetes mellitus with other diabetic kidney complication: Secondary | ICD-10-CM | POA: Diagnosis not present

## 2020-03-18 ENCOUNTER — Encounter: Payer: Self-pay | Admitting: Cardiology

## 2020-03-18 ENCOUNTER — Other Ambulatory Visit: Payer: Self-pay

## 2020-03-18 ENCOUNTER — Ambulatory Visit: Payer: Medicare Other | Admitting: Cardiology

## 2020-03-18 VITALS — BP 140/68 | HR 78 | Ht 63.0 in | Wt 193.0 lb

## 2020-03-18 DIAGNOSIS — R0789 Other chest pain: Secondary | ICD-10-CM | POA: Diagnosis not present

## 2020-03-18 DIAGNOSIS — E782 Mixed hyperlipidemia: Secondary | ICD-10-CM | POA: Diagnosis not present

## 2020-03-18 DIAGNOSIS — I10 Essential (primary) hypertension: Secondary | ICD-10-CM | POA: Diagnosis not present

## 2020-03-18 NOTE — Patient Instructions (Signed)
Medication Instructions:   Your physician recommends that you continue on your current medications as directed. Please refer to the Current Medication list given to you today.  *If you need a refill on your cardiac medications before your next appointment, please call your pharmacy*   Lab Work: None today  If you have labs (blood work) drawn today and your tests are completely normal, you will receive your results only by: . MyChart Message (if you have MyChart) OR . A paper copy in the mail If you have any lab test that is abnormal or we need to change your treatment, we will call you to review the results.   Testing/Procedures: None today   Follow-Up: At [facility], you and your health needs are our priority.  As part of our continuing mission to provide you with exceptional heart care, we have created designated Provider Care Teams.  These Care Teams include your primary Cardiologist (physician) and Advanced Practice Providers (APPs -  Physician Assistants and Nurse Practitioners) who all work together to provide you with the care you need, when you need it.  We recommend signing up for the patient portal called "MyChart".  Sign up information is provided on this After Visit Summary.  MyChart is used to connect with patients for Virtual Visits (Telemedicine).  Patients are able to view lab/test results, encounter notes, upcoming appointments, etc.  Non-urgent messages can be sent to your provider as well.   To learn more about what you can do with MyChart, go to [email or web address].    Your next appointment:   12 month(s)  The format for your next appointment:   In Person  Provider:   Jonathan Branch, MD   Other Instructions None      Thank you for choosing  [facility] !         

## 2020-03-18 NOTE — Progress Notes (Signed)
Clinical Summary Margaret Huerta is a 75 y.o.female seen today for follow up of the following medical problems.   1. History of chest pain Patient had left heart cath in 2003 (a J.  [facility]) and in 2016.  For abnormal stress test.  This both showed normal coronary arteries  07/2019 mild ischemia, low risk - no recent symptoms - previously has responved to SL NG, has done well since starting isordil.   2. HTN - edema on norvasc - home bp's after meds 130s/70s - chronic orthostatic dizziness  3. Hyperlipidemia - labs followed by pcp - she is on crestor   4. CKD - Dr [name] follows   4. DM2  5. OSA not on cpap  6. Orthostatic syncope - no recent episodes of syncope, chronic orthostatic dizzienss at times    Past Medical History:  Diagnosis Date  . Anemia   . Anxiety   . Cervical cancer ([place])   . Chronic bronchitis ([place])    "get it q yr"  . Chronic lower back pain   . Depression   . Febrile seizure ([place])    "as a child"  . Fibromyalgia   . GERD (gastroesophageal reflux disease)   . Gout   . History of blood transfusion    "S/P tonsillectomy"  . History of hiatal hernia   . Hx of cardiovascular stress test April 2016   false positive Myoview  . Hyperlipidemia   . Hypertension   . Migraine    "monthly" (05/26/2014)  . Neuropathy   . Osteoporosis   . 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 Pneumonia "several times"  . Renal cancer ([place])    "right"-s/p nephrectomy  . Renal insufficiency    "left kidney works at 40-60%" (05/26/2014)  . Sleep apnea    "wore mask for 2 months; could not take it" (05/26/2014)  . Type II diabetes mellitus ([place])   . Typhus fever    "as a child"     Allergies  Allergen Reactions  . Demerol [Meperidine] Shortness Of Breath    Stop breathing  . Hydrocodone-Acetaminophen Shortness Of Breath    REACTION: stop breathing  . Meperidine Hcl Shortness Of Breath    REACTION: stop breathing  .  Vicodin [Hydrocodone-Acetaminophen] Shortness Of Breath    Stop breathing  . Ace Inhibitors Cough  . Amlodipine Swelling  . Codeine     REACTION: hallucination, confused  . Contrast Media [Iodinated Diagnostic Agents]     MRI dye. Shaky, nausea  . Fentanyl Itching  . Hydromorphone Hcl     REACTION: itching, rash, nausea, vomiting  . Morphine     REACTION: vomiting  . Naproxen     REACTION: stomach lesion  . Dilaudid [Hydromorphone Hcl] Itching, Nausea And Vomiting and Rash     Current Outpatient Medications  Medication Sig Dispense Refill  . acetaminophen (TYLENOL) 325 MG tablet Take 2 tablets (650 mg total) by mouth every 4 (four) hours as needed for headache or mild pain.    Marland Kitchen allopurinol (ZYLOPRIM) 300 MG tablet Take 300 mg by mouth daily.   12  . atenolol (TENORMIN) 50 MG tablet Take 50 mg by mouth 2 (two) times daily.    . DULoxetine (CYMBALTA) 60 MG capsule Take 60 mg by mouth daily.      . furosemide (LASIX) 40 MG tablet Take 40 mg by mouth 2 (two) times daily.    . hydrALAZINE (  APRESOLINE) 25 MG tablet Take 1 tablet (25 mg total) by mouth 2 (two) times daily. 60 tablet 6  . isosorbide dinitrate (ISORDIL) 10 MG tablet Take 2 tablets (20 mg total) by mouth in the morning. & 10 mg in the evening 270 tablet 3  . loperamide (IMODIUM A-D) 2 MG tablet Take 0.5 tablets (1 mg total) by mouth daily with breakfast. 30 tablet 0  . NITROSTAT 0.4 MG SL tablet Place 1 tablet (0.4 mg total) under the tongue as needed. 25 tablet 3  . olmesartan (BENICAR) 40 MG tablet Take 1 tablet (40 mg total) by mouth daily. 90 tablet 2  . rifaximin (XIFAXAN) 550 MG TABS tablet Take 1 tablet (550 mg total) by mouth 3 (three) times daily. 42 tablet 0  . rosuvastatin (CRESTOR) 10 MG tablet Take 1 tablet (10 mg total) by mouth daily.     No current facility-administered medications for this visit.     Past Surgical History:  Procedure Laterality Date  . ABDOMINAL HERNIA REPAIR  02/2013   "in [place]"   . ABDOMINAL HYSTERECTOMY  1977   "partial"  . APPENDECTOMY    . BACK SURGERY    . BILATERAL SALPINGOOPHORECTOMY Bilateral ~ 1993  . CARDIAC CATHETERIZATION  2003, April 2016   normal coronaries  . CATARACT EXTRACTION W/ INTRAOCULAR LENS  IMPLANT, BILATERAL  2015  . COLONOSCOPY N/A 11/25/2014   Procedure: COLONOSCOPY;  Surgeon: Rogene Houston, MD;  Location: [facility];  Service: Endoscopy;  Laterality: N/A;  . CYSTOSCOPY W/ [place] MANIPULATION  1988  . DIAGNOSTIC LAPAROSCOPY  1975  . DILATION AND CURETTAGE OF UTERUS    . ESOPHAGOGASTRODUODENOSCOPY N/A 11/25/2014   Procedure: ESOPHAGOGASTRODUODENOSCOPY (EGD);  Surgeon: Rogene Houston, MD;  Location: [facility];  Service: Endoscopy;  Laterality: N/A;  9:30 - moved to 11:25 - [name] to notify pt  . ESOPHAGOGASTRODUODENOSCOPY (EGD) WITH ESOPHAGEAL DILATION  1997  . EYE SURGERY    . GASTRIC BYPASS  2012  . HERNIA REPAIR     2015  . HIATAL HERNIA REPAIR  1996  . INCONTINENCE SURGERY  1983  . KNEE ARTHROSCOPY Left 2000's  . LAPAROSCOPIC CHOLECYSTECTOMY  1990's  . LEFT HEART CATHETERIZATION WITH CORONARY ANGIOGRAM N/A 05/27/2014   Procedure: LEFT HEART CATHETERIZATION WITH CORONARY ANGIOGRAM;  Surgeon: Lorretta Harp, MD;  Location: [facility] CATH LAB;  Service: Cardiovascular;  Laterality: N/A;  . LUMBAR [place] SURGERY  2001   "herniated discs"  . [place]   "drilled into gum and put tooth implants uppers"  . NEPHRECTOMY Right 2002   "cancer"  . TMJ ARTHROPLASTY  1988  . TONSILLECTOMY  1976  . UPPER GI ENDOSCOPY       Allergies  Allergen Reactions  . Demerol [Meperidine] Shortness Of Breath    Stop breathing  . Hydrocodone-Acetaminophen Shortness Of Breath    REACTION: stop breathing  . Meperidine Hcl Shortness Of Breath    REACTION: stop breathing  . Vicodin [Hydrocodone-Acetaminophen] Shortness Of Breath    Stop breathing  . Ace Inhibitors Cough  . Amlodipine Swelling  . Codeine     REACTION: hallucination,  confused  . Contrast Media [Iodinated Diagnostic Agents]     MRI dye. Shaky, nausea  . Fentanyl Itching  . Hydromorphone Hcl     REACTION: itching, rash, nausea, vomiting  . Morphine     REACTION: vomiting  . Naproxen     REACTION: stomach lesion  . Dilaudid [Hydromorphone Hcl] Itching, Nausea And  Vomiting and Rash      Family History  Problem Relation Age of Onset  . Emphysema Father   . Heart disease Father   . Clotting disorder Mother   . Diabetes Mother   . Kidney disease Mother   . Allergies Other        whole family per pt  . Lung cancer Maternal Uncle   . Alcohol abuse Maternal Uncle      Social History Margaret Huerta reports that she has never smoked. She has never used smokeless tobacco. Margaret Huerta reports no history of alcohol use.   Review of Systems CONSTITUTIONAL: No weight loss, fever, chills, weakness or fatigue.  HEENT: Eyes: No visual loss, blurred vision, double vision or yellow sclerae.No hearing loss, sneezing, congestion, runny nose or sore throat.  SKIN: No rash or itching.  CARDIOVASCULAR: per hpi RESPIRATORY: No shortness of breath, cough or sputum.  GASTROINTESTINAL: No anorexia, nausea, vomiting or diarrhea. No abdominal pain or blood.  GENITOURINARY: No burning on urination, no polyuria NEUROLOGICAL: No headache, dizziness, syncope, paralysis, ataxia, numbness or tingling in the extremities. No change in bowel or bladder control.  MUSCULOSKELETAL: No muscle, back pain, joint pain or stiffness.  LYMPHATICS: No enlarged nodes. No history of splenectomy.  PSYCHIATRIC: No history of depression or anxiety.  ENDOCRINOLOGIC: No reports of sweating, cold or heat intolerance. No polyuria or polydipsia.  Marland Kitchen   Physical Examination Today's Vitals   03/18/20 1103  BP: 140/68  Pulse: 78  SpO2: 96%  Weight: 193 lb (87.5 kg)  Height: 5\' 3"  (1.6 m)   Body mass index is 34.19 kg/m.  Gen: resting comfortably, no acute distress HEENT: no scleral icterus,  pupils equal round and reactive, no palptable cervical adenopathy,  CV: RRR, no m/r/g, no jvd Resp: Clear to auscultation bilaterally GI: abdomen is soft, non-tender, non-distended, normal bowel sounds, no hepatosplenomegaly MSK: extremities are warm, no edema.  Skin: warm, no rash Neuro:  no focal deficits Psych: appropriate affect   Diagnostic Studies Nuclear stress test 08/05/2019  There was no ST segment deviation noted during stress.  Findings consistent with mild apical ischemia.  This is a low risk study.  The left ventricular ejection fraction is hyperdynamic (>65%).  Echo 08/05/19 . Left ventricular ejection fraction, by estimation, is 60 to 65%. The  left ventricle has normal function. The left ventricle has no regional  wall motion abnormalities. There is mild left ventricular hypertrophy.  Left ventricular diastolic parameters  are consistent with Grade I diastolic dysfunction (impaired relaxation).  2. Right ventricular systolic function is normal. The right ventricular  size is normal. There is mildly elevated pulmonary artery systolic  pressure.  3. Left atrial size was severely dilated.  4. Right atrial size was mildly dilated.  5. The mitral valve is normal in structure. Mild mitral valve  regurgitation. No evidence of mitral stenosis.  6. The aortic valve is tricuspid. Aortic valve regurgitation is not  visualized. No aortic stenosis is present.  7. The inferior vena cava is normal in size with greater than 50%  respiratory variability, suggesting right atrial pressure of 3 mmHg.  8. Left to right atrial shunting noted by color Doppler, probable PFO.    11/2019 renal artery [place] Summary:  Largest Aortic Diameter: 2.2 cm    Renal:    Right: Right kidney removed 2002.  Left: Normal size of left kidney. Abnormal left Resisitve Index.     Normal cortical thickness of the left kidney. No evidence of  left renal artery stenosis. LRV flow  present.  Mesenteric:  Normal Celiac artery and Superior Mesenteric artery findings.     Assessment and Plan  1. Chest pain - long history of symptoms with overall benign ischemic testing, most recently 07/2019 - no recent symptoms, continue to monitor  2. HTN - mildly above goal, with prior orthostatic dizziness/syncope would accept higher bp's, continue current meds  3. Hyperlipidemia - request labs from pcp, continue statin      Arnoldo Lenis, M.D.

## 2020-03-24 DIAGNOSIS — E1122 Type 2 diabetes mellitus with diabetic chronic kidney disease: Secondary | ICD-10-CM | POA: Diagnosis not present

## 2020-03-24 DIAGNOSIS — E211 Secondary hyperparathyroidism, not elsewhere classified: Secondary | ICD-10-CM | POA: Diagnosis not present

## 2020-03-24 DIAGNOSIS — I5032 Chronic diastolic (congestive) heart failure: Secondary | ICD-10-CM | POA: Diagnosis not present

## 2020-03-24 DIAGNOSIS — N189 Chronic kidney disease, unspecified: Secondary | ICD-10-CM | POA: Diagnosis not present

## 2020-03-24 DIAGNOSIS — I129 Hypertensive chronic kidney disease with stage 1 through stage 4 chronic kidney disease, or unspecified chronic kidney disease: Secondary | ICD-10-CM | POA: Diagnosis not present

## 2020-04-01 DIAGNOSIS — I129 Hypertensive chronic kidney disease with stage 1 through stage 4 chronic kidney disease, or unspecified chronic kidney disease: Secondary | ICD-10-CM | POA: Diagnosis not present

## 2020-04-01 DIAGNOSIS — E211 Secondary hyperparathyroidism, not elsewhere classified: Secondary | ICD-10-CM | POA: Diagnosis not present

## 2020-04-01 DIAGNOSIS — E1122 Type 2 diabetes mellitus with diabetic chronic kidney disease: Secondary | ICD-10-CM | POA: Diagnosis not present

## 2020-04-01 DIAGNOSIS — R809 Proteinuria, unspecified: Secondary | ICD-10-CM | POA: Diagnosis not present

## 2020-04-01 DIAGNOSIS — R808 Other proteinuria: Secondary | ICD-10-CM | POA: Diagnosis not present

## 2020-04-01 DIAGNOSIS — N189 Chronic kidney disease, unspecified: Secondary | ICD-10-CM | POA: Diagnosis not present

## 2020-04-01 DIAGNOSIS — E1129 Type 2 diabetes mellitus with other diabetic kidney complication: Secondary | ICD-10-CM | POA: Diagnosis not present

## 2020-04-01 DIAGNOSIS — I5032 Chronic diastolic (congestive) heart failure: Secondary | ICD-10-CM | POA: Diagnosis not present

## 2020-05-03 ENCOUNTER — Other Ambulatory Visit (HOSPITAL_COMMUNITY): Payer: Self-pay | Admitting: Internal Medicine

## 2020-05-03 ENCOUNTER — Ambulatory Visit (HOSPITAL_COMMUNITY): Payer: Medicare Other

## 2020-05-03 DIAGNOSIS — N184 Chronic kidney disease, stage 4 (severe): Secondary | ICD-10-CM | POA: Diagnosis not present

## 2020-05-03 DIAGNOSIS — R1032 Left lower quadrant pain: Secondary | ICD-10-CM | POA: Diagnosis not present

## 2020-05-03 DIAGNOSIS — E1129 Type 2 diabetes mellitus with other diabetic kidney complication: Secondary | ICD-10-CM | POA: Diagnosis not present

## 2020-05-03 DIAGNOSIS — I10 Essential (primary) hypertension: Secondary | ICD-10-CM | POA: Diagnosis not present

## 2020-05-03 DIAGNOSIS — E785 Hyperlipidemia, unspecified: Secondary | ICD-10-CM | POA: Diagnosis not present

## 2020-05-03 DIAGNOSIS — R1110 Vomiting, unspecified: Secondary | ICD-10-CM | POA: Diagnosis not present

## 2020-05-03 DIAGNOSIS — E1142 Type 2 diabetes mellitus with diabetic polyneuropathy: Secondary | ICD-10-CM | POA: Diagnosis not present

## 2020-05-06 ENCOUNTER — Other Ambulatory Visit: Payer: Self-pay

## 2020-05-06 ENCOUNTER — Ambulatory Visit (HOSPITAL_COMMUNITY)
Admission: RE | Admit: 2020-05-06 | Discharge: 2020-05-06 | Disposition: A | Discharge status: Home / Self Care | Payer: Medicare Other | Source: Ambulatory Visit | Attending: Internal Medicine | Admitting: Internal Medicine

## 2020-05-06 DIAGNOSIS — R1032 Left lower quadrant pain: Secondary | ICD-10-CM | POA: Diagnosis not present

## 2020-05-06 DIAGNOSIS — R1110 Vomiting, unspecified: Secondary | ICD-10-CM

## 2020-05-06 DIAGNOSIS — Z85528 Personal history of other malignant neoplasm of kidney: Secondary | ICD-10-CM | POA: Diagnosis not present

## 2020-05-06 DIAGNOSIS — I700 Atherosclerosis of aorta: Secondary | ICD-10-CM | POA: Diagnosis not present

## 2020-05-16 DIAGNOSIS — E785 Hyperlipidemia, unspecified: Secondary | ICD-10-CM | POA: Diagnosis not present

## 2020-05-16 DIAGNOSIS — E1129 Type 2 diabetes mellitus with other diabetic kidney complication: Secondary | ICD-10-CM | POA: Diagnosis not present

## 2020-05-16 DIAGNOSIS — Z79899 Other long term (current) drug therapy: Secondary | ICD-10-CM | POA: Diagnosis not present

## 2020-05-23 DIAGNOSIS — R7309 Other abnormal glucose: Secondary | ICD-10-CM | POA: Diagnosis not present

## 2020-05-23 DIAGNOSIS — I700 Atherosclerosis of aorta: Secondary | ICD-10-CM | POA: Diagnosis not present

## 2020-05-23 DIAGNOSIS — E1122 Type 2 diabetes mellitus with diabetic chronic kidney disease: Secondary | ICD-10-CM | POA: Diagnosis not present

## 2020-05-23 DIAGNOSIS — E785 Hyperlipidemia, unspecified: Secondary | ICD-10-CM | POA: Diagnosis not present

## 2020-05-23 DIAGNOSIS — I10 Essential (primary) hypertension: Secondary | ICD-10-CM | POA: Diagnosis not present

## 2020-06-02 DIAGNOSIS — E1122 Type 2 diabetes mellitus with diabetic chronic kidney disease: Secondary | ICD-10-CM | POA: Diagnosis not present

## 2020-06-02 DIAGNOSIS — E211 Secondary hyperparathyroidism, not elsewhere classified: Secondary | ICD-10-CM | POA: Diagnosis not present

## 2020-06-02 DIAGNOSIS — N189 Chronic kidney disease, unspecified: Secondary | ICD-10-CM | POA: Diagnosis not present

## 2020-06-02 DIAGNOSIS — I5032 Chronic diastolic (congestive) heart failure: Secondary | ICD-10-CM | POA: Diagnosis not present

## 2020-06-02 DIAGNOSIS — I129 Hypertensive chronic kidney disease with stage 1 through stage 4 chronic kidney disease, or unspecified chronic kidney disease: Secondary | ICD-10-CM | POA: Diagnosis not present

## 2020-06-06 ENCOUNTER — Ambulatory Visit (INDEPENDENT_AMBULATORY_CARE_PROVIDER_SITE_OTHER): Payer: Medicare Other | Admitting: Internal Medicine

## 2020-06-06 ENCOUNTER — Other Ambulatory Visit: Payer: Self-pay

## 2020-06-06 ENCOUNTER — Encounter (INDEPENDENT_AMBULATORY_CARE_PROVIDER_SITE_OTHER): Payer: Self-pay | Admitting: Internal Medicine

## 2020-06-06 VITALS — BP 143/81 | HR 57 | Temp 97.9°F | Ht 63.0 in | Wt 188.6 lb

## 2020-06-06 DIAGNOSIS — K529 Noninfective gastroenteritis and colitis, unspecified: Secondary | ICD-10-CM | POA: Diagnosis not present

## 2020-06-06 MED ORDER — HYOSCYAMINE SULFATE SL 0.125 MG SL SUBL
1.0000 | SUBLINGUAL_TABLET | Freq: Three times a day (TID) | SUBLINGUAL | 1 refills | Status: DC | PRN
Start: 2020-06-06 — End: 2021-06-05

## 2020-06-06 NOTE — Progress Notes (Signed)
Presenting complaint;  Follow-up for chronic diarrhea.  Database and subjective:  Patient is 75 year old Caucasian female who has chronic diarrhea for which she was last seen in September 2021.  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  She had EGD and colonoscopy back in October 2017.  Occasional biopsies were normal and similarly random colonic biopsies were negative for microscopic or collagenous colitis.  She had tried Bentyl in the past and it has not worked.  She has been using Imodium on as-needed basis. Xifaxan was recommended for 2 weeks but she did not take the medication because her co-pay was $500.  She is advised to take Imodium and she kept a stool diary.  Review of the diary in October 2021 revealed her stools to be anywhere from loose to solid.  Patient was advised to take Imodium/loperamide on as-needed basis. She now returns for follow-up visit.  She is still having spells of diarrhea.  She had 10 bowel movements yesterday.  She had similar episode 3 weeks ago.  These episodes are not associated with fever or chills melena or rectal bleeding.  She does experience nausea.  She generally gets better by next day.  On most days she may have a couple of stools.  Stool is hard to begin with but then become soft.  She says she has changed her eating habits.  She is trying to avoid foods which worsen her diarrhea such as bread tomatoes and beans.  Her appetite is good.  She has lost 5 pounds since her last visit.  She is happy to do so. Last month she experienced left lower quadrant abdominal pain.  She had abdominal pelvic CT by Dr. [name].  Study was negative for diverticulitis but revealed sigmoid diverticulosis.  The study also showed possibly 3 exophytic nodules in left kidney felt to be unchanged since MRI of November 2020.   Current Medications: Outpatient Encounter Medications as of 06/06/2020  Medication Sig  . acetaminophen (TYLENOL) 325 MG tablet  Take 2 tablets (650 mg total) by mouth every 4 (four) hours as needed for headache or mild pain.  Marland Kitchen allopurinol (ZYLOPRIM) 300 MG tablet Take 300 mg by mouth daily.   Marland Kitchen atenolol (TENORMIN) 50 MG tablet Take 50 mg by mouth 2 (two) times daily.  . DULoxetine (CYMBALTA) 60 MG capsule Take 60 mg by mouth daily.  . furosemide (LASIX) 40 MG tablet Take 40 mg by mouth 2 (two) times daily.  . hydrALAZINE (APRESOLINE) 25 MG tablet Take 1 tablet (25 mg total) by mouth 2 (two) times daily.  . insulin glargine (LANTUS) 100 UNIT/ML injection Inject 6 Units into the skin daily.  . isosorbide dinitrate (ISORDIL) 10 MG tablet Take 2 tablets (20 mg total) by mouth in the morning. & 10 mg in the evening  . loperamide (IMODIUM A-D) 2 MG tablet Take 0.5 tablets (1 mg total) by mouth daily with breakfast.  . NITROSTAT 0.4 MG SL tablet Place 1 tablet (0.4 mg total) under the tongue as needed.  . rosuvastatin (CRESTOR) 10 MG tablet Take 1 tablet (10 mg total) by mouth daily.   No facility-administered encounter medications on file as of 06/06/2020.     Objective: Blood pressure (!) 143/81, pulse (!) 57, temperature 97.9 F (36.6 C), temperature source Oral, height $RemoveBefo'5\' 3"'MyoTofxGjIn$  (1.6 m), weight 188 lb 9.6 oz (85.5 kg). Patient is alert and in no acute distress. She is wearing a mask. Conjunctiva is pink. Sclera is nonicteric Oropharyngeal mucosa  is normal. No neck masses or thyromegaly noted. Cardiac exam with regular rhythm normal S1 and S2. No murmur or gallop noted. Lungs are clear to auscultation. Abdomen is full.  She has long midline scar along with horizontal scar in right upper quadrant extending laterally..  She has incisional hernia around the scar. No LE edema or clubbing noted.  Labs/studies Results:  CBC Latest Ref Rng & Units 11/06/2018 05/30/2017 02/17/2016  WBC 3.8 - 10.8 Thousand/uL 7.2 7.2 -  Hemoglobin 11.7 - 15.5 g/dL 13.1 10.9(L) 11.6(L)  Hematocrit 35.0 - 45.0 % 40.6 37.2 36.9  Platelets 140 -  400 Thousand/uL 215 228 -    CMP Latest Ref Rng & Units 10/15/2019 05/30/2017 07/05/2014  Glucose 70 - 99 mg/dL 127(H) 128(H) 112(H)  BUN 8 - 23 mg/dL 46(H) 26(H) 42(H)  Creatinine 0.44 - 1.00 mg/dL 1.92(H) 1.50(H) 2.40(H)  Sodium 135 - 145 mmol/L 137 140 138  Potassium 3.5 - 5.1 mmol/L 4.2 3.7 3.7  Chloride 98 - 111 mmol/L 104 103 104  CO2 22 - 32 mmol/L 24 26 25   Calcium 8.9 - 10.3 mg/dL 9.0 9.5 9.8  Total Protein 6.5 - 8.1 g/dL - 7.1 -  Total Bilirubin 0.3 - 1.2 mg/dL - 0.7 -  Alkaline Phos 38 - 126 U/L - 122 -  AST 15 - 41 U/L - 36 -  ALT 14 - 54 U/L - 25 -    Hepatic Function Latest Ref Rng & Units 05/30/2017  Total Protein 6.5 - 8.1 g/dL 7.1  Albumin 3.5 - 5.0 g/dL 3.5  AST 15 - 41 U/L 36  ALT 14 - 54 U/L 25  Alk Phosphatase 38 - 126 U/L 122  Total Bilirubin 0.3 - 1.2 mg/dL 0.7      Assessment:  #1.  Chronic diarrhea felt to be multifactorial but primarily due to prior Roux-en-Y surgery.  Also appears to have IBS.  She has failed low-dose dicyclomine in the past.  Now that she is having left lower quadrant abdominal pain we will try her on a different antispasmodic.  Plan:  Continue Imodium OTC 1 mg daily with breakfast. Hyoscyamine sublingual 1 tablet up to 3 times a day as needed.  Patient was informed of potential side effects such as dry mouth and constipation. Unless symptoms worsen she will return for office visit in 1 year.

## 2020-06-06 NOTE — Patient Instructions (Signed)
Hyoscyamine sublingual 1 tablet up to 3 times a day on days when he have multiple bowel movements. Please call office if diarrhea lasts more than 48 hours.

## 2020-06-09 DIAGNOSIS — E1122 Type 2 diabetes mellitus with diabetic chronic kidney disease: Secondary | ICD-10-CM | POA: Diagnosis not present

## 2020-06-09 DIAGNOSIS — N189 Chronic kidney disease, unspecified: Secondary | ICD-10-CM | POA: Diagnosis not present

## 2020-06-09 DIAGNOSIS — N2889 Other specified disorders of kidney and ureter: Secondary | ICD-10-CM | POA: Diagnosis not present

## 2020-06-09 DIAGNOSIS — I5032 Chronic diastolic (congestive) heart failure: Secondary | ICD-10-CM | POA: Diagnosis not present

## 2020-06-09 DIAGNOSIS — R300 Dysuria: Secondary | ICD-10-CM | POA: Diagnosis not present

## 2020-06-09 DIAGNOSIS — R808 Other proteinuria: Secondary | ICD-10-CM | POA: Diagnosis not present

## 2020-06-09 DIAGNOSIS — I129 Hypertensive chronic kidney disease with stage 1 through stage 4 chronic kidney disease, or unspecified chronic kidney disease: Secondary | ICD-10-CM | POA: Diagnosis not present

## 2020-06-14 ENCOUNTER — Ambulatory Visit (INDEPENDENT_AMBULATORY_CARE_PROVIDER_SITE_OTHER): Payer: Medicare Other | Admitting: Internal Medicine

## 2020-06-23 ENCOUNTER — Ambulatory Visit: Payer: Self-pay

## 2020-07-08 DIAGNOSIS — Z23 Encounter for immunization: Secondary | ICD-10-CM | POA: Diagnosis not present

## 2020-07-12 DIAGNOSIS — E1129 Type 2 diabetes mellitus with other diabetic kidney complication: Secondary | ICD-10-CM | POA: Diagnosis not present

## 2020-07-12 DIAGNOSIS — K219 Gastro-esophageal reflux disease without esophagitis: Secondary | ICD-10-CM | POA: Diagnosis not present

## 2020-07-12 DIAGNOSIS — I10 Essential (primary) hypertension: Secondary | ICD-10-CM | POA: Diagnosis not present

## 2020-07-27 ENCOUNTER — Encounter (INDEPENDENT_AMBULATORY_CARE_PROVIDER_SITE_OTHER): Payer: Medicare Other | Admitting: Ophthalmology

## 2020-08-09 ENCOUNTER — Ambulatory Visit: Payer: Medicare Other | Admitting: Urology

## 2020-08-09 ENCOUNTER — Encounter: Payer: Self-pay | Admitting: Urology

## 2020-08-09 ENCOUNTER — Other Ambulatory Visit: Payer: Self-pay

## 2020-08-09 VITALS — BP 146/83 | HR 68 | Temp 98.5°F | Wt 183.0 lb

## 2020-08-09 DIAGNOSIS — N2889 Other specified disorders of kidney and ureter: Secondary | ICD-10-CM

## 2020-08-09 LAB — URINALYSIS, ROUTINE W REFLEX MICROSCOPIC
Bilirubin, UA: NEGATIVE
Glucose, UA: NEGATIVE
Ketones, UA: NEGATIVE
Nitrite, UA: POSITIVE — AB
Specific Gravity, UA: 1.02 (ref 1.005–1.030)
Urobilinogen, Ur: 0.2 mg/dL (ref 0.2–1.0)
pH, UA: 5.5 (ref 5.0–7.5)

## 2020-08-09 LAB — MICROSCOPIC EXAMINATION
Renal Epithel, UA: NONE SEEN /hpf
WBC, UA: >30 /hpf — AB (ref 0–5)

## 2020-08-09 MED ORDER — MIRABEGRON ER 25 MG PO TB24: 25.0000 mg | ORAL_TABLET | Freq: Every day | ORAL | 0 refills | Status: DC

## 2020-08-09 NOTE — Patient Instructions (Signed)

## 2020-08-09 NOTE — Progress Notes (Signed)
08/09/2020 1:28 PM   Margaret Huerta 1946/01/17 [id number]  Referring provider: [name], MD [address] [place],  [place] [address]  Left renal mass  HPI: Ms Margaret Huerta is a 75yo here for evaluation of a left renal mass. She was previously seen by Dr. Terance Hart and had a right radical nephrectomy in 2002. She had CKD4 and is followed by Dr. [name]. She has known left renal complex cysts which have been stable on CT and MRI for over 1 year. She has complaints today of worsening urgency, frequency and urge incontinence over the past 18 months. No prior OAB therapy. No numbness/tingling in fingers/toes.    PMH: Past Medical History:  Diagnosis Date   Anemia    Anxiety    Cervical cancer ([place])    Chronic bronchitis ([place])    "get it q yr"   Chronic lower back pain    Depression    Febrile seizure ([place])    "as a child"   Fibromyalgia    GERD (gastroesophageal reflux disease)    Gout    History of blood transfusion    "S/P tonsillectomy"   History of hiatal hernia    Hx of cardiovascular stress test April 2016   false positive Myoview   Hyperlipidemia    Hypertension    Migraine    "monthly" (05/26/2014)   Neuropathy    Osteoporosis    Parathyroid disease ([place])    "my PHT levels run high; I can't take calcium"   Pneumonia "several times"   Renal cancer ([place])    "right"-s/p nephrectomy   Renal insufficiency    "left kidney works at 40-60%" (05/26/2014)   Sleep apnea    "wore mask for 2 months; could not take it" (05/26/2014)   Type II diabetes mellitus ([place])    Typhus fever    "as a child"    Surgical History: Past Surgical History:  Procedure Laterality Date   ABDOMINAL HERNIA REPAIR  02/2013   "in [place]"   [place]   "partial"   [place] Bilateral ~ [place]  2003, April 2016   normal coronaries   CATARACT EXTRACTION W/ INTRAOCULAR LENS  IMPLANT,  BILATERAL  2015   COLONOSCOPY N/A 11/25/2014   Procedure: COLONOSCOPY;  Surgeon: Rogene Houston, MD;  [facility];  Service: Endoscopy;  Laterality: N/A;   CYSTOSCOPY W/ [place] MANIPULATION  1988   DIAGNOSTIC LAPAROSCOPY  1975   DILATION AND CURETTAGE OF UTERUS     ESOPHAGOGASTRODUODENOSCOPY N/A 11/25/2014   Procedure: ESOPHAGOGASTRODUODENOSCOPY (EGD);  Surgeon: Rogene Houston, MD;  [facility]: [facility];  Service: Endoscopy;  Laterality: N/A;  9:30 - moved to 11:25 - [name] to notify pt   ESOPHAGOGASTRODUODENOSCOPY (EGD) Accident     GASTRIC BYPASS  2012   HERNIA REPAIR     2015   [place] ARTHROSCOPY Left 2000's   LAPAROSCOPIC CHOLECYSTECTOMY  1990's   LEFT HEART CATHETERIZATION WITH CORONARY ANGIOGRAM N/A 05/27/2014   Procedure: LEFT HEART CATHETERIZATION WITH CORONARY ANGIOGRAM;  Surgeon: Lorretta Harp, MD;  Location: [facility];  Service: Cardiovascular;  Laterality: N/A;   [place] SURGERY  2001   "herniated discs"   [place]   "drilled into gum and put tooth implants uppers"  NEPHRECTOMY Right 2002   "cancer"   TMJ ARTHROPLASTY  1988   TONSILLECTOMY  1976   UPPER GI ENDOSCOPY      Home Medications:  Allergies as of 08/09/2020       Reactions   Demerol [meperidine] Shortness Of Breath   Stop breathing   Hydrocodone-acetaminophen Shortness Of Breath   REACTION: stop breathing   Meperidine Hcl Shortness Of Breath   REACTION: stop breathing   Vicodin [hydrocodone-acetaminophen] Shortness Of Breath   Stop breathing   Ace Inhibitors Cough   Amlodipine Swelling   Codeine    REACTION: hallucination, confused   Contrast Media [iodinated Diagnostic Agents]    MRI dye. Shaky, nausea   Fentanyl Itching   Hydromorphone Hcl    REACTION: itching, rash, nausea, vomiting   Morphine    REACTION: vomiting   Naproxen    REACTION: stomach lesion   Dilaudid  [hydromorphone Hcl] Itching, Nausea And Vomiting, Rash        Medication List        Accurate as of August 09, 2020  1:28 PM. If you have any questions, ask your nurse or doctor.          acetaminophen 325 MG tablet Commonly known as: TYLENOL Take 2 tablets (650 mg total) by mouth every 4 (four) hours as needed for headache or mild pain.   allopurinol 300 MG tablet Commonly known as: ZYLOPRIM Take 300 mg by mouth daily.   atenolol 50 MG tablet Commonly known as: TENORMIN Take 50 mg by mouth 2 (two) times daily.   docusate sodium 50 MG capsule Commonly known as: COLACE Take 50 mg by mouth daily.   DULoxetine 60 MG capsule Commonly known as: CYMBALTA Take 60 mg by mouth daily.   furosemide 40 MG tablet Commonly known as: LASIX Take 40 mg by mouth 2 (two) times daily.   hydrALAZINE 25 MG tablet Commonly known as: APRESOLINE Take 1 tablet (25 mg total) by mouth 2 (two) times daily.   Hyoscyamine Sulfate SL 0.125 MG Subl Commonly known as: Levsin/SL Place 1 tablet under the tongue 3 (three) times daily as needed.   insulin glargine 100 UNIT/ML injection Commonly known as: LANTUS Inject 6 Units into the skin daily.   isosorbide dinitrate 10 MG tablet Commonly known as: ISORDIL Take 2 tablets (20 mg total) by mouth in the morning. & 10 mg in the evening   loperamide 2 MG tablet Commonly known as: IMODIUM A-D Take 0.5 tablets (1 mg total) by mouth daily with breakfast.   Nitrostat 0.4 MG SL tablet Generic drug: nitroGLYCERIN Place 1 tablet (0.4 mg total) under the tongue as needed.   rosuvastatin 10 MG tablet Commonly known as: CRESTOR Take 1 tablet (10 mg total) by mouth daily.        Allergies:  Allergies  Allergen Reactions   Demerol [Meperidine] Shortness Of Breath    Stop breathing   Hydrocodone-Acetaminophen Shortness Of Breath    REACTION: stop breathing   Meperidine Hcl Shortness Of Breath    REACTION: stop breathing   Vicodin  [Hydrocodone-Acetaminophen] Shortness Of Breath    Stop breathing   Ace Inhibitors Cough   Amlodipine Swelling   Codeine     REACTION: hallucination, confused   Contrast Media [Iodinated Diagnostic Agents]     MRI dye. Shaky, nausea   Fentanyl Itching   Hydromorphone Hcl     REACTION: itching, rash, nausea, vomiting   Morphine     REACTION: vomiting   Naproxen  REACTION: stomach lesion   Dilaudid [Hydromorphone Hcl] Itching, Nausea And Vomiting and Rash    Family History: Family History  Problem Relation Age of Onset   Emphysema Father    Heart disease Father    Clotting disorder Mother    Diabetes Mother    Kidney disease Mother    Allergies Other        whole family per pt   Lung cancer Maternal Uncle    Alcohol abuse Maternal Uncle     Social History:  reports that she has never smoked. She has never used smokeless tobacco. She reports that she does not drink alcohol and does not use drugs.  ROS: All other review of systems were reviewed and are negative except what is noted above in HPI  Physical Exam: BP (!) 146/83   Pulse 68   Temp 98.5 F (36.9 C)   Wt 183 lb (83 kg)   BMI 32.42 kg/m   Constitutional:  Alert and oriented, No acute distress. HEENT: [place] AT, moist mucus membranes.  Trachea midline, no masses. Cardiovascular: No clubbing, cyanosis, or edema. Respiratory: Normal respiratory effort, no increased work of breathing. GI: Abdomen is soft, nontender, nondistended, no abdominal masses GU: No CVA tenderness.  Lymph: No cervical or inguinal lymphadenopathy. Skin: No rashes, bruises or suspicious lesions. Neurologic: Grossly intact, no focal deficits, moving all 4 extremities. Psychiatric: Normal mood and affect.  Laboratory Data: Lab Results  Component Value Date   WBC 7.2 11/06/2018   HGB 13.1 11/06/2018   HCT 40.6 11/06/2018   MCV 88.6 11/06/2018   PLT 215 11/06/2018    Lab Results  Component Value Date   CREATININE 1.92 (H) 10/15/2019     No results found for: PSA  No results found for: TESTOSTERONE  No results found for: HGBA1C  Urinalysis    Component Value Date/Time   COLORURINE YELLOW 05/30/2017 1247   APPEARANCEUR CLOUDY (A) 05/30/2017 1247   LABSPEC 1.012 05/30/2017 1247   PHURINE 5.0 05/30/2017 1247   GLUCOSEU NEGATIVE 05/30/2017 1247   HGBUR NEGATIVE 05/30/2017 1247   BILIRUBINUR NEGATIVE 05/30/2017 1247   KETONESUR NEGATIVE 05/30/2017 1247   PROTEINUR 100 (A) 05/30/2017 1247   NITRITE NEGATIVE 05/30/2017 1247   LEUKOCYTESUR MODERATE (A) 05/30/2017 1247    Lab Results  Component Value Date   BACTERIA MANY (A) 05/30/2017    Pertinent Imaging: CT 05/06/2020: Images reviewed and discussed with the patient No results found for this or any previous visit.  No results found for this or any previous visit.  No results found for this or any previous visit.  No results found for this or any previous visit.  No results found for this or any previous visit.  No results found for this or any previous visit.  No results found for this or any previous visit.  No results found for this or any previous visit.   Assessment & Plan:    1. Renal mass We discussed the natural hx of renal masses. We disucssed the treatment options including active surveillance. Renal ablation, partial and radical nephrectomy. We Will proceed with surveillance. I will see her back in 6 months with an MRI abd - Urinalysis, Routine w reflex microscopic  2. OAB -We will trial Gemtesa 75mg  daily   No follow-ups on file.  Nicolette Bang, MD  [facility] [place]

## 2020-08-09 NOTE — Addendum Note (Signed)
Addended by: Dorisann Frames on: 08/09/2020 03:17 PM   Modules accepted: Orders

## 2020-08-09 NOTE — Progress Notes (Signed)
Urological Symptom Review  Patient is experiencing the following symptoms: Frequent urination   Review of Systems  Gastrointestinal (upper)   Nausa Vomiting     Gastrointestinal (lower) : Diarrhea  Constitutional : Weight loss Fatigue  Skin: Skin rash/lesion Itching  Eyes: Blurred vision  Ear/Nose/Throat : Sore throat Sinus problems  Hematologic/Lymphatic: Easy bruising  Cardiovascular : Chest pain  Respiratory : Cough Shortness of breath  Endocrine: Negative for endocrine symptoms  Musculoskeletal: Back pain  Neurological: Headaches Dizziness  Psychologic: Depression Anxiety

## 2020-08-10 DIAGNOSIS — E1129 Type 2 diabetes mellitus with other diabetic kidney complication: Secondary | ICD-10-CM | POA: Diagnosis not present

## 2020-08-10 DIAGNOSIS — E559 Vitamin D deficiency, unspecified: Secondary | ICD-10-CM | POA: Diagnosis not present

## 2020-08-10 DIAGNOSIS — I5032 Chronic diastolic (congestive) heart failure: Secondary | ICD-10-CM | POA: Diagnosis not present

## 2020-08-10 DIAGNOSIS — R808 Other proteinuria: Secondary | ICD-10-CM | POA: Diagnosis not present

## 2020-08-10 DIAGNOSIS — N189 Chronic kidney disease, unspecified: Secondary | ICD-10-CM | POA: Diagnosis not present

## 2020-08-10 DIAGNOSIS — I129 Hypertensive chronic kidney disease with stage 1 through stage 4 chronic kidney disease, or unspecified chronic kidney disease: Secondary | ICD-10-CM | POA: Diagnosis not present

## 2020-08-10 DIAGNOSIS — E1122 Type 2 diabetes mellitus with diabetic chronic kidney disease: Secondary | ICD-10-CM | POA: Diagnosis not present

## 2020-08-12 ENCOUNTER — Telehealth: Payer: Self-pay

## 2020-08-12 DIAGNOSIS — N2889 Other specified disorders of kidney and ureter: Secondary | ICD-10-CM

## 2020-08-12 LAB — URINE CULTURE

## 2020-08-12 MED ORDER — NITROFURANTOIN MONOHYD MACRO 100 MG PO CAPS: 100.0000 mg | ORAL_CAPSULE | Freq: Two times a day (BID) | ORAL | 0 refills | Status: DC

## 2020-08-12 NOTE — Telephone Encounter (Signed)
Patient called and notified. Rx sent to pharmacy.

## 2020-08-12 NOTE — Telephone Encounter (Signed)
-----   Message from Cleon Gustin, MD sent at 08/12/2020  3:56 PM EDT ----- Macrobid 100mg  BID for 7 days ----- Message ----- From: Dorisann Frames, RN Sent: 08/12/2020   9:22 AM EDT To: Cleon Gustin, MD  Please review- patient not on any medication for UTI?

## 2020-08-17 DIAGNOSIS — I129 Hypertensive chronic kidney disease with stage 1 through stage 4 chronic kidney disease, or unspecified chronic kidney disease: Secondary | ICD-10-CM | POA: Diagnosis not present

## 2020-08-17 DIAGNOSIS — N2889 Other specified disorders of kidney and ureter: Secondary | ICD-10-CM | POA: Diagnosis not present

## 2020-08-17 DIAGNOSIS — N189 Chronic kidney disease, unspecified: Secondary | ICD-10-CM | POA: Diagnosis not present

## 2020-08-17 DIAGNOSIS — R808 Other proteinuria: Secondary | ICD-10-CM | POA: Diagnosis not present

## 2020-08-17 DIAGNOSIS — E211 Secondary hyperparathyroidism, not elsewhere classified: Secondary | ICD-10-CM | POA: Diagnosis not present

## 2020-08-17 DIAGNOSIS — E1122 Type 2 diabetes mellitus with diabetic chronic kidney disease: Secondary | ICD-10-CM | POA: Diagnosis not present

## 2020-08-17 DIAGNOSIS — I5032 Chronic diastolic (congestive) heart failure: Secondary | ICD-10-CM | POA: Diagnosis not present

## 2020-08-17 DIAGNOSIS — N390 Urinary tract infection, site not specified: Secondary | ICD-10-CM | POA: Diagnosis not present

## 2020-08-22 ENCOUNTER — Other Ambulatory Visit: Payer: Self-pay | Admitting: Family Medicine

## 2020-08-22 DIAGNOSIS — G629 Polyneuropathy, unspecified: Secondary | ICD-10-CM | POA: Diagnosis not present

## 2020-08-22 DIAGNOSIS — R7309 Other abnormal glucose: Secondary | ICD-10-CM | POA: Diagnosis not present

## 2020-08-22 DIAGNOSIS — N184 Chronic kidney disease, stage 4 (severe): Secondary | ICD-10-CM | POA: Diagnosis not present

## 2020-08-22 DIAGNOSIS — E1122 Type 2 diabetes mellitus with diabetic chronic kidney disease: Secondary | ICD-10-CM | POA: Diagnosis not present

## 2020-08-22 NOTE — Telephone Encounter (Signed)
Rx(s) sent to pharmacy electronically.  

## 2020-10-10 DIAGNOSIS — M810 Age-related osteoporosis without current pathological fracture: Secondary | ICD-10-CM | POA: Diagnosis not present

## 2020-10-10 DIAGNOSIS — R42 Dizziness and giddiness: Secondary | ICD-10-CM | POA: Diagnosis not present

## 2020-10-10 DIAGNOSIS — N184 Chronic kidney disease, stage 4 (severe): Secondary | ICD-10-CM | POA: Diagnosis not present

## 2020-10-10 DIAGNOSIS — G629 Polyneuropathy, unspecified: Secondary | ICD-10-CM | POA: Diagnosis not present

## 2020-10-13 DIAGNOSIS — I129 Hypertensive chronic kidney disease with stage 1 through stage 4 chronic kidney disease, or unspecified chronic kidney disease: Secondary | ICD-10-CM | POA: Diagnosis not present

## 2020-10-13 DIAGNOSIS — N189 Chronic kidney disease, unspecified: Secondary | ICD-10-CM | POA: Diagnosis not present

## 2020-10-13 DIAGNOSIS — D519 Vitamin B12 deficiency anemia, unspecified: Secondary | ICD-10-CM | POA: Diagnosis not present

## 2020-10-13 DIAGNOSIS — I5032 Chronic diastolic (congestive) heart failure: Secondary | ICD-10-CM | POA: Diagnosis not present

## 2020-10-13 DIAGNOSIS — E1122 Type 2 diabetes mellitus with diabetic chronic kidney disease: Secondary | ICD-10-CM | POA: Diagnosis not present

## 2020-10-13 DIAGNOSIS — Z79899 Other long term (current) drug therapy: Secondary | ICD-10-CM | POA: Diagnosis not present

## 2020-10-13 DIAGNOSIS — R808 Other proteinuria: Secondary | ICD-10-CM | POA: Diagnosis not present

## 2020-10-25 DIAGNOSIS — I129 Hypertensive chronic kidney disease with stage 1 through stage 4 chronic kidney disease, or unspecified chronic kidney disease: Secondary | ICD-10-CM | POA: Diagnosis not present

## 2020-10-25 DIAGNOSIS — R808 Other proteinuria: Secondary | ICD-10-CM | POA: Diagnosis not present

## 2020-10-25 DIAGNOSIS — N189 Chronic kidney disease, unspecified: Secondary | ICD-10-CM | POA: Diagnosis not present

## 2020-10-25 DIAGNOSIS — E1122 Type 2 diabetes mellitus with diabetic chronic kidney disease: Secondary | ICD-10-CM | POA: Diagnosis not present

## 2020-10-25 DIAGNOSIS — I5032 Chronic diastolic (congestive) heart failure: Secondary | ICD-10-CM | POA: Diagnosis not present

## 2020-10-25 DIAGNOSIS — E211 Secondary hyperparathyroidism, not elsewhere classified: Secondary | ICD-10-CM | POA: Diagnosis not present

## 2020-11-11 DIAGNOSIS — I10 Essential (primary) hypertension: Secondary | ICD-10-CM | POA: Diagnosis not present

## 2020-11-11 DIAGNOSIS — K219 Gastro-esophageal reflux disease without esophagitis: Secondary | ICD-10-CM | POA: Diagnosis not present

## 2020-11-11 DIAGNOSIS — E1129 Type 2 diabetes mellitus with other diabetic kidney complication: Secondary | ICD-10-CM | POA: Diagnosis not present

## 2020-11-15 DIAGNOSIS — E1129 Type 2 diabetes mellitus with other diabetic kidney complication: Secondary | ICD-10-CM | POA: Diagnosis not present

## 2020-11-22 DIAGNOSIS — N184 Chronic kidney disease, stage 4 (severe): Secondary | ICD-10-CM | POA: Diagnosis not present

## 2020-11-22 DIAGNOSIS — E1122 Type 2 diabetes mellitus with diabetic chronic kidney disease: Secondary | ICD-10-CM | POA: Diagnosis not present

## 2020-11-22 DIAGNOSIS — I10 Essential (primary) hypertension: Secondary | ICD-10-CM | POA: Diagnosis not present

## 2020-11-22 DIAGNOSIS — R7309 Other abnormal glucose: Secondary | ICD-10-CM | POA: Diagnosis not present

## 2020-11-28 ENCOUNTER — Other Ambulatory Visit (HOSPITAL_COMMUNITY): Payer: Self-pay | Admitting: Internal Medicine

## 2020-11-28 DIAGNOSIS — Z1231 Encounter for screening mammogram for malignant neoplasm of breast: Secondary | ICD-10-CM

## 2020-11-28 DIAGNOSIS — Z23 Encounter for immunization: Secondary | ICD-10-CM | POA: Diagnosis not present

## 2020-12-09 DIAGNOSIS — Z23 Encounter for immunization: Secondary | ICD-10-CM | POA: Diagnosis not present

## 2020-12-21 DIAGNOSIS — N189 Chronic kidney disease, unspecified: Secondary | ICD-10-CM | POA: Diagnosis not present

## 2020-12-21 DIAGNOSIS — R808 Other proteinuria: Secondary | ICD-10-CM | POA: Diagnosis not present

## 2020-12-21 DIAGNOSIS — E1122 Type 2 diabetes mellitus with diabetic chronic kidney disease: Secondary | ICD-10-CM | POA: Diagnosis not present

## 2020-12-21 DIAGNOSIS — I129 Hypertensive chronic kidney disease with stage 1 through stage 4 chronic kidney disease, or unspecified chronic kidney disease: Secondary | ICD-10-CM | POA: Diagnosis not present

## 2020-12-21 DIAGNOSIS — I5032 Chronic diastolic (congestive) heart failure: Secondary | ICD-10-CM | POA: Diagnosis not present

## 2020-12-28 DIAGNOSIS — E1122 Type 2 diabetes mellitus with diabetic chronic kidney disease: Secondary | ICD-10-CM | POA: Diagnosis not present

## 2020-12-28 DIAGNOSIS — N189 Chronic kidney disease, unspecified: Secondary | ICD-10-CM | POA: Diagnosis not present

## 2020-12-28 DIAGNOSIS — R808 Other proteinuria: Secondary | ICD-10-CM | POA: Diagnosis not present

## 2020-12-28 DIAGNOSIS — E211 Secondary hyperparathyroidism, not elsewhere classified: Secondary | ICD-10-CM | POA: Diagnosis not present

## 2020-12-28 DIAGNOSIS — I5032 Chronic diastolic (congestive) heart failure: Secondary | ICD-10-CM | POA: Diagnosis not present

## 2020-12-28 DIAGNOSIS — I129 Hypertensive chronic kidney disease with stage 1 through stage 4 chronic kidney disease, or unspecified chronic kidney disease: Secondary | ICD-10-CM | POA: Diagnosis not present

## 2021-01-02 ENCOUNTER — Ambulatory Visit (HOSPITAL_COMMUNITY): Payer: Medicare Other

## 2021-01-12 ENCOUNTER — Ambulatory Visit (HOSPITAL_COMMUNITY)
Admission: RE | Admit: 2021-01-12 | Discharge: 2021-01-12 | Disposition: A | Discharge status: Home / Self Care | Payer: Medicare Other | Source: Ambulatory Visit | Attending: Internal Medicine | Admitting: Internal Medicine

## 2021-01-12 ENCOUNTER — Other Ambulatory Visit: Payer: Self-pay

## 2021-01-12 DIAGNOSIS — Z1231 Encounter for screening mammogram for malignant neoplasm of breast: Secondary | ICD-10-CM | POA: Insufficient documentation

## 2021-01-16 DIAGNOSIS — H5213 Myopia, bilateral: Secondary | ICD-10-CM | POA: Diagnosis not present

## 2021-01-16 DIAGNOSIS — H26491 Other secondary cataract, right eye: Secondary | ICD-10-CM | POA: Diagnosis not present

## 2021-01-16 DIAGNOSIS — H10413 Chronic giant papillary conjunctivitis, bilateral: Secondary | ICD-10-CM | POA: Diagnosis not present

## 2021-01-16 DIAGNOSIS — E113293 Type 2 diabetes mellitus with mild nonproliferative diabetic retinopathy without macular edema, bilateral: Secondary | ICD-10-CM | POA: Diagnosis not present

## 2021-01-16 DIAGNOSIS — Z961 Presence of intraocular lens: Secondary | ICD-10-CM | POA: Diagnosis not present

## 2021-01-16 DIAGNOSIS — H43812 Vitreous degeneration, left eye: Secondary | ICD-10-CM | POA: Diagnosis not present

## 2021-02-08 ENCOUNTER — Ambulatory Visit: Payer: Medicare Other | Admitting: Urology

## 2021-02-14 DIAGNOSIS — E1129 Type 2 diabetes mellitus with other diabetic kidney complication: Secondary | ICD-10-CM | POA: Diagnosis not present

## 2021-02-21 ENCOUNTER — Other Ambulatory Visit (HOSPITAL_COMMUNITY): Payer: Self-pay | Admitting: Internal Medicine

## 2021-02-21 ENCOUNTER — Telehealth: Payer: Medicare Other | Admitting: Cardiology

## 2021-02-21 DIAGNOSIS — N950 Postmenopausal bleeding: Secondary | ICD-10-CM

## 2021-02-22 ENCOUNTER — Other Ambulatory Visit: Payer: Self-pay | Admitting: Cardiology

## 2021-02-24 DIAGNOSIS — E211 Secondary hyperparathyroidism, not elsewhere classified: Secondary | ICD-10-CM | POA: Diagnosis not present

## 2021-02-24 DIAGNOSIS — I5032 Chronic diastolic (congestive) heart failure: Secondary | ICD-10-CM | POA: Diagnosis not present

## 2021-02-24 DIAGNOSIS — R808 Other proteinuria: Secondary | ICD-10-CM | POA: Diagnosis not present

## 2021-02-24 DIAGNOSIS — N189 Chronic kidney disease, unspecified: Secondary | ICD-10-CM | POA: Diagnosis not present

## 2021-02-24 DIAGNOSIS — E1122 Type 2 diabetes mellitus with diabetic chronic kidney disease: Secondary | ICD-10-CM | POA: Diagnosis not present

## 2021-02-24 DIAGNOSIS — I129 Hypertensive chronic kidney disease with stage 1 through stage 4 chronic kidney disease, or unspecified chronic kidney disease: Secondary | ICD-10-CM | POA: Diagnosis not present

## 2021-02-27 ENCOUNTER — Ambulatory Visit (HOSPITAL_COMMUNITY)
Admission: RE | Admit: 2021-02-27 | Discharge: 2021-02-27 | Disposition: A | Discharge status: Home / Self Care | Payer: Medicare Other | Source: Ambulatory Visit | Attending: Internal Medicine | Admitting: Internal Medicine

## 2021-02-27 ENCOUNTER — Other Ambulatory Visit: Payer: Self-pay

## 2021-02-27 DIAGNOSIS — K900 Celiac disease: Secondary | ICD-10-CM | POA: Insufficient documentation

## 2021-02-27 DIAGNOSIS — Z13820 Encounter for screening for osteoporosis: Secondary | ICD-10-CM | POA: Diagnosis not present

## 2021-02-27 DIAGNOSIS — Z780 Asymptomatic menopausal state: Secondary | ICD-10-CM | POA: Insufficient documentation

## 2021-02-27 DIAGNOSIS — N950 Postmenopausal bleeding: Secondary | ICD-10-CM | POA: Insufficient documentation

## 2021-02-27 DIAGNOSIS — M810 Age-related osteoporosis without current pathological fracture: Secondary | ICD-10-CM | POA: Insufficient documentation

## 2021-02-28 DIAGNOSIS — E559 Vitamin D deficiency, unspecified: Secondary | ICD-10-CM | POA: Diagnosis not present

## 2021-02-28 DIAGNOSIS — R808 Other proteinuria: Secondary | ICD-10-CM | POA: Diagnosis not present

## 2021-02-28 DIAGNOSIS — E1122 Type 2 diabetes mellitus with diabetic chronic kidney disease: Secondary | ICD-10-CM | POA: Diagnosis not present

## 2021-02-28 DIAGNOSIS — E211 Secondary hyperparathyroidism, not elsewhere classified: Secondary | ICD-10-CM | POA: Diagnosis not present

## 2021-02-28 DIAGNOSIS — N189 Chronic kidney disease, unspecified: Secondary | ICD-10-CM | POA: Diagnosis not present

## 2021-02-28 DIAGNOSIS — I5032 Chronic diastolic (congestive) heart failure: Secondary | ICD-10-CM | POA: Diagnosis not present

## 2021-04-05 DIAGNOSIS — E211 Secondary hyperparathyroidism, not elsewhere classified: Secondary | ICD-10-CM | POA: Diagnosis not present

## 2021-04-05 DIAGNOSIS — R808 Other proteinuria: Secondary | ICD-10-CM | POA: Diagnosis not present

## 2021-04-05 DIAGNOSIS — E1122 Type 2 diabetes mellitus with diabetic chronic kidney disease: Secondary | ICD-10-CM | POA: Diagnosis not present

## 2021-04-05 DIAGNOSIS — N189 Chronic kidney disease, unspecified: Secondary | ICD-10-CM | POA: Diagnosis not present

## 2021-04-05 DIAGNOSIS — I5032 Chronic diastolic (congestive) heart failure: Secondary | ICD-10-CM | POA: Diagnosis not present

## 2021-04-12 DIAGNOSIS — I129 Hypertensive chronic kidney disease with stage 1 through stage 4 chronic kidney disease, or unspecified chronic kidney disease: Secondary | ICD-10-CM | POA: Diagnosis not present

## 2021-04-12 DIAGNOSIS — R808 Other proteinuria: Secondary | ICD-10-CM | POA: Diagnosis not present

## 2021-04-12 DIAGNOSIS — E211 Secondary hyperparathyroidism, not elsewhere classified: Secondary | ICD-10-CM | POA: Diagnosis not present

## 2021-04-12 DIAGNOSIS — E559 Vitamin D deficiency, unspecified: Secondary | ICD-10-CM | POA: Diagnosis not present

## 2021-04-12 DIAGNOSIS — N170 Acute kidney failure with tubular necrosis: Secondary | ICD-10-CM | POA: Diagnosis not present

## 2021-04-12 DIAGNOSIS — N189 Chronic kidney disease, unspecified: Secondary | ICD-10-CM | POA: Diagnosis not present

## 2021-04-12 DIAGNOSIS — E1122 Type 2 diabetes mellitus with diabetic chronic kidney disease: Secondary | ICD-10-CM | POA: Diagnosis not present

## 2021-04-12 DIAGNOSIS — L659 Nonscarring hair loss, unspecified: Secondary | ICD-10-CM | POA: Diagnosis not present

## 2021-04-12 DIAGNOSIS — I5032 Chronic diastolic (congestive) heart failure: Secondary | ICD-10-CM | POA: Diagnosis not present

## 2021-04-13 ENCOUNTER — Other Ambulatory Visit: Payer: Self-pay | Admitting: Nephrology

## 2021-04-13 ENCOUNTER — Other Ambulatory Visit (HOSPITAL_COMMUNITY): Payer: Self-pay | Admitting: Nephrology

## 2021-04-13 DIAGNOSIS — N170 Acute kidney failure with tubular necrosis: Secondary | ICD-10-CM

## 2021-04-13 DIAGNOSIS — E1122 Type 2 diabetes mellitus with diabetic chronic kidney disease: Secondary | ICD-10-CM

## 2021-04-13 DIAGNOSIS — I129 Hypertensive chronic kidney disease with stage 1 through stage 4 chronic kidney disease, or unspecified chronic kidney disease: Secondary | ICD-10-CM

## 2021-04-13 DIAGNOSIS — R809 Proteinuria, unspecified: Secondary | ICD-10-CM

## 2021-04-17 ENCOUNTER — Ambulatory Visit (HOSPITAL_COMMUNITY)
Admission: RE | Admit: 2021-04-17 | Discharge: 2021-04-17 | Disposition: A | Discharge status: Home / Self Care | Payer: Medicare Other | Source: Ambulatory Visit | Attending: Nephrology | Admitting: Nephrology

## 2021-04-17 ENCOUNTER — Other Ambulatory Visit: Payer: Self-pay

## 2021-04-17 DIAGNOSIS — N170 Acute kidney failure with tubular necrosis: Secondary | ICD-10-CM

## 2021-04-17 DIAGNOSIS — I129 Hypertensive chronic kidney disease with stage 1 through stage 4 chronic kidney disease, or unspecified chronic kidney disease: Secondary | ICD-10-CM

## 2021-04-17 DIAGNOSIS — E1122 Type 2 diabetes mellitus with diabetic chronic kidney disease: Secondary | ICD-10-CM | POA: Diagnosis not present

## 2021-04-17 DIAGNOSIS — R809 Proteinuria, unspecified: Secondary | ICD-10-CM | POA: Diagnosis not present

## 2021-04-17 DIAGNOSIS — N281 Cyst of kidney, acquired: Secondary | ICD-10-CM | POA: Diagnosis not present

## 2021-04-17 DIAGNOSIS — N179 Acute kidney failure, unspecified: Secondary | ICD-10-CM | POA: Diagnosis not present

## 2021-04-26 DIAGNOSIS — N170 Acute kidney failure with tubular necrosis: Secondary | ICD-10-CM | POA: Diagnosis not present

## 2021-04-26 DIAGNOSIS — I5032 Chronic diastolic (congestive) heart failure: Secondary | ICD-10-CM | POA: Diagnosis not present

## 2021-04-26 DIAGNOSIS — R808 Other proteinuria: Secondary | ICD-10-CM | POA: Diagnosis not present

## 2021-04-26 DIAGNOSIS — E1122 Type 2 diabetes mellitus with diabetic chronic kidney disease: Secondary | ICD-10-CM | POA: Diagnosis not present

## 2021-04-26 DIAGNOSIS — N189 Chronic kidney disease, unspecified: Secondary | ICD-10-CM | POA: Diagnosis not present

## 2021-05-04 DIAGNOSIS — R808 Other proteinuria: Secondary | ICD-10-CM | POA: Diagnosis not present

## 2021-05-04 DIAGNOSIS — I5032 Chronic diastolic (congestive) heart failure: Secondary | ICD-10-CM | POA: Diagnosis not present

## 2021-05-04 DIAGNOSIS — N19 Unspecified kidney failure: Secondary | ICD-10-CM | POA: Diagnosis not present

## 2021-05-04 DIAGNOSIS — I129 Hypertensive chronic kidney disease with stage 1 through stage 4 chronic kidney disease, or unspecified chronic kidney disease: Secondary | ICD-10-CM | POA: Diagnosis not present

## 2021-05-04 DIAGNOSIS — E1122 Type 2 diabetes mellitus with diabetic chronic kidney disease: Secondary | ICD-10-CM | POA: Diagnosis not present

## 2021-05-04 DIAGNOSIS — E211 Secondary hyperparathyroidism, not elsewhere classified: Secondary | ICD-10-CM | POA: Diagnosis not present

## 2021-05-04 DIAGNOSIS — N189 Chronic kidney disease, unspecified: Secondary | ICD-10-CM | POA: Diagnosis not present

## 2021-05-04 DIAGNOSIS — N170 Acute kidney failure with tubular necrosis: Secondary | ICD-10-CM | POA: Diagnosis not present

## 2021-05-12 DIAGNOSIS — E1129 Type 2 diabetes mellitus with other diabetic kidney complication: Secondary | ICD-10-CM | POA: Diagnosis not present

## 2021-05-12 DIAGNOSIS — K219 Gastro-esophageal reflux disease without esophagitis: Secondary | ICD-10-CM | POA: Diagnosis not present

## 2021-05-12 DIAGNOSIS — D631 Anemia in chronic kidney disease: Secondary | ICD-10-CM | POA: Diagnosis not present

## 2021-05-12 DIAGNOSIS — R809 Proteinuria, unspecified: Secondary | ICD-10-CM | POA: Diagnosis not present

## 2021-05-12 DIAGNOSIS — I10 Essential (primary) hypertension: Secondary | ICD-10-CM | POA: Diagnosis not present

## 2021-05-12 DIAGNOSIS — N185 Chronic kidney disease, stage 5: Secondary | ICD-10-CM | POA: Diagnosis not present

## 2021-05-16 DIAGNOSIS — E1129 Type 2 diabetes mellitus with other diabetic kidney complication: Secondary | ICD-10-CM | POA: Diagnosis not present

## 2021-05-16 DIAGNOSIS — I10 Essential (primary) hypertension: Secondary | ICD-10-CM | POA: Diagnosis not present

## 2021-05-16 DIAGNOSIS — M109 Gout, unspecified: Secondary | ICD-10-CM | POA: Diagnosis not present

## 2021-05-16 DIAGNOSIS — Z79899 Other long term (current) drug therapy: Secondary | ICD-10-CM | POA: Diagnosis not present

## 2021-05-16 DIAGNOSIS — N184 Chronic kidney disease, stage 4 (severe): Secondary | ICD-10-CM | POA: Diagnosis not present

## 2021-05-22 ENCOUNTER — Other Ambulatory Visit: Payer: Self-pay | Admitting: Cardiology

## 2021-05-24 ENCOUNTER — Ambulatory Visit: Payer: Medicare Other | Admitting: Vascular Surgery

## 2021-05-24 ENCOUNTER — Encounter: Payer: Self-pay | Admitting: Vascular Surgery

## 2021-05-24 VITALS — BP 162/85 | HR 59 | Temp 97.2°F | Resp 14 | Ht 62.0 in | Wt 169.8 lb

## 2021-05-24 DIAGNOSIS — N184 Chronic kidney disease, stage 4 (severe): Secondary | ICD-10-CM | POA: Diagnosis not present

## 2021-05-24 NOTE — H&P (View-Only) (Signed)
? ? ?[facility] ? ?Patient name: Margaret Huerta MRN: [id number] DOB: 11-20-1945 Sex: female ? ?REASON FOR CONSULT: Discuss access for hemodialysis ? ?HPI: ?Margaret Huerta is a 76 y.o. female, who is here today for discussion of access for hemodialysis.  She is here today with her husband.  She has had a long history of progressive renal insufficiency related to hypertension and diabetes.  She now has progressed to a GFR of 13% with a creatinine of 3.5.  She is here today for discussion of access options.  She writes with her right hand but reports that she is essentially ambidextrous.  He does not have a pacemaker.  She is not on any anticoagulant. ? ?Past Medical History:  ?Diagnosis Date  ? Anemia   ? Anxiety   ? Cervical cancer ([place])   ? Chronic bronchitis ([place])   ? "get it q yr"  ? Chronic lower back pain   ? Depression   ? Febrile seizure ([place])   ? "as a child"  ? Fibromyalgia   ? GERD (gastroesophageal reflux disease)   ? Gout   ? History of blood transfusion   ? "S/P tonsillectomy"  ? History of hiatal hernia   ? Hx of cardiovascular stress test April 2016  ? false positive Myoview  ? Hyperlipidemia   ? Hypertension   ? Migraine   ? "monthly" (05/26/2014)  ? Neuropathy   ? Osteoporosis   ? Parathyroid disease ([place])   ? "my PHT levels run high; I can't take calcium"  ? Pneumonia "several times"  ? Renal cancer ([place])   ? "right"-s/p nephrectomy  ? Renal insufficiency   ? "left kidney works at 40-60%" (05/26/2014)  ? Sleep apnea   ? "wore mask for 2 months; could not take it" (05/26/2014)  ? Type II diabetes mellitus ([place])   ? Typhus fever   ? "as a child"  ? ? ?Family History  ?Problem Relation Age of Onset  ? Emphysema Father   ? Heart disease Father   ? Clotting disorder Mother   ? Diabetes Mother   ? Kidney disease Mother   ? Allergies Other   ?     whole family per pt  ? Lung cancer Maternal Uncle   ? Alcohol abuse Maternal Uncle   ? ? ?SOCIAL  HISTORY: ?Social History  ? ?Socioeconomic History  ? Marital status: Married  ?  Spouse name: Not on file  ? Number of children: Not on file  ? Years of education: Not on file  ? Highest education level: Not on file  ?Occupational History  ? Occupation: Housewife  ?Tobacco Use  ? Smoking status: Never  ? Smokeless tobacco: Never  ?Vaping Use  ? Vaping Use: Never used  ?Substance and Sexual Activity  ? Alcohol use: No  ?  Alcohol/week: 0.0 standard drinks  ? Drug use: No  ? Sexual activity: Yes  ?  Birth control/protection: Surgical  ?Other Topics Concern  ? Not on file  ?Social History Narrative  ? Not on file  ? ?Social Determinants of Health  ? ?Financial Resource Strain: Not on file  ?Food Insecurity: Not on file  ?Transportation Needs: Not on file  ?Physical Activity: Not on file  ?Stress: Not on file  ?Social Connections: Not on file  ?Intimate Partner Violence: Not on file  ? ? ?Allergies  ?Allergen Reactions  ? Demerol [Meperidine] Shortness Of Breath  ?  Stop breathing  ? Hydrocodone-Acetaminophen  Shortness Of Breath  ?  REACTION: stop breathing  ? Meperidine Hcl Shortness Of Breath  ?  REACTION: stop breathing  ? Vicodin [Hydrocodone-Acetaminophen] Shortness Of Breath  ?  Stop breathing  ? Ace Inhibitors Cough  ? Amlodipine Swelling  ? Codeine   ?  REACTION: hallucination, confused  ? Contrast Media [Iodinated Contrast Media]   ?  MRI dye. Shaky, nausea  ? Fentanyl Itching  ? Hydromorphone Hcl   ?  REACTION: itching, rash, nausea, vomiting  ? Morphine   ?  REACTION: vomiting  ? Naproxen   ?  REACTION: stomach lesion  ? Dilaudid [Hydromorphone Hcl] Itching, Nausea And Vomiting and Rash  ? ? ?Current Outpatient Medications  ?Medication Sig Dispense Refill  ? acetaminophen (TYLENOL) 325 MG tablet Take 2 tablets (650 mg total) by mouth every 4 (four) hours as needed for headache or mild pain.    ? allopurinol (ZYLOPRIM) 300 MG tablet Take 300 mg by mouth daily.   12  ? atenolol (TENORMIN) 50 MG tablet Take 50  mg by mouth 2 (two) times daily.    ? docusate sodium (COLACE) 50 MG capsule Take 50 mg by mouth daily.    ? DULoxetine (CYMBALTA) 60 MG capsule Take 60 mg by mouth daily.    ? furosemide (LASIX) 40 MG tablet Take 40 mg by mouth 2 (two) times daily.    ? hydrALAZINE (APRESOLINE) 25 MG tablet Take 1 tablet (25 mg total) by mouth 2 (two) times daily. 60 tablet 6  ? Hyoscyamine Sulfate SL (LEVSIN/SL) 0.125 MG SUBL Place 1 tablet under the tongue 3 (three) times daily as needed. 60 tablet 1  ? insulin glargine (LANTUS) 100 UNIT/ML injection Inject 6 Units into the skin daily.    ? isosorbide dinitrate (ISORDIL) 10 MG tablet TAKE 2 TABLETS BY MOUTH IN THE MORNING AND 1 TAB IN THE EVENING . APPOINTMENT REQUIRED FOR FUTURE REFILLS 90 tablet 0  ? loperamide (IMODIUM A-D) 2 MG tablet Take 0.5 tablets (1 mg total) by mouth daily with breakfast. 30 tablet 0  ? mirabegron ER (MYRBETRIQ) 25 MG TB24 tablet Take 1 tablet (25 mg total) by mouth daily. 30 tablet 0  ? nitrofurantoin, macrocrystal-monohydrate, (MACROBID) 100 MG capsule Take 1 capsule (100 mg total) by mouth every 12 (twelve) hours. 14 capsule 0  ? NITROSTAT 0.4 MG SL tablet Place 1 tablet (0.4 mg total) under the tongue as needed. 25 tablet 3  ? rosuvastatin (CRESTOR) 10 MG tablet Take 1 tablet (10 mg total) by mouth daily.    ? ?No current facility-administered medications for this visit.  ? ? ?REVIEW OF SYSTEMS:  ?'[X]'$  denotes positive finding, '[ ]'$  denotes negative finding ?Cardiac  Comments:  ?Chest pain or chest pressure:    ?Shortness of breath upon exertion: x   ?Short of breath when lying flat:    ?Irregular heart rhythm:    ?    ?Vascular    ?Pain in calf, thigh, or hip brought on by ambulation: x   ?Pain in feet at night that wakes you up from your sleep:  x   ?Blood clot in your veins:    ?Leg swelling:  x   ?    ?Pulmonary    ?Oxygen at home:    ?Productive cough:     ?Wheezing:     ?    ?Neurologic    ?Sudden weakness in arms or legs:  x   ?Sudden numbness  in arms or legs:  x   ?  Sudden onset of difficulty speaking or slurred speech:    ?Temporary loss of vision in one eye:     ?Problems with dizziness:  x    ?    ?Gastrointestinal    ?Blood in stool:     ?Vomited blood:     ?    ?Genitourinary    ?Burning when urinating:     ?Blood in urine:    ?    ?Psychiatric    ?Major depression:     ?    ?Hematologic    ?Bleeding problems:    ?Problems with blood clotting too easily:    ?    ?Skin    ?Rashes or ulcers:    ?    ?Constitutional    ?Fever or chills:    ? ? ?PHYSICAL EXAM: ?Vitals:  ? 05/24/21 0932  ?BP: (!) 162/85  ?Pulse: (!) 59  ?Resp: 14  ?Temp: (!) 97.2 ?F (36.2 ?C)  ?TempSrc: Temporal  ?SpO2: 96%  ?Weight: 169 lb 12.8 oz (77 kg)  ?Height: '5\' 2"'$  (1.575 m)  ? ? ?GENERAL: The patient is a well-nourished female, in no acute distress. The vital signs are documented above. ?CARDIOVASCULAR: 2+ radial pulses bilaterally.  Normal surface veins bilaterally visible antecubital veins bilaterally. ?PULMONARY: There is good air exchange  ?MUSCULOSKELETAL: There are no major deformities or cyanosis. ?NEUROLOGIC: No focal weakness or paresthesias are detected. ?SKIN: There are no ulcers or rashes noted. ?PSYCHIATRIC: The patient has a normal affect. ? ?DATA:  ?I imaged her veins in both arms with SonoSite ultrasound.  She does have a very small cephalic vein in the forearm.  She has moderate cephalic vein at the antecubital space with small to moderate cephalic vein in the upper arm bilaterally.  She has a large basilic vein antecubital space proximally on the left right ? ?MEDICAL ISSUES: ?Had a long discussion with the patient and her husband regarding access for him and [place].  Tunneled catheter for urgent hemodialysis limitation potential infection related to this.  Discussed AV fistula and AV graft advantages and disadvantages of each of the's.  She does appear to be a candidate for left arm fistula.  She has marginal size cephalic vein left upper arm.  She has  excellent size basilic vein in the left upper arm.  Would recommend left arm AV fistula either cephalic or basilic depending on vein size at the time of surgery.  She understands this is a outpatient procedure at

## 2021-05-24 NOTE — Progress Notes (Signed)
? ? ?[facility] [place] ? ?Patient name: Margaret Huerta MRN: [id number] DOB: 07-Jun-1945 Sex: female ? ?REASON FOR CONSULT: Discuss access for hemodialysis ? ?HPI: ?Margaret Huerta is a 76 y.o. female, who is here today for discussion of access for hemodialysis.  She is here today with her husband.  She has had a long history of progressive renal insufficiency related to hypertension and diabetes.  She now has progressed to a GFR of 13% with a creatinine of 3.5.  She is here today for discussion of access options.  She writes with her right hand but reports that she is essentially ambidextrous.  He does not have a pacemaker.  She is not on any anticoagulant. ? ?Past Medical History:  ?Diagnosis Date  ? Anemia   ? Anxiety   ? Cervical cancer ([place])   ? Chronic bronchitis ([place])   ? "get it q yr"  ? Chronic lower back pain   ? Depression   ? Febrile seizure ([place])   ? "as a child"  ? Fibromyalgia   ? GERD (gastroesophageal reflux disease)   ? Gout   ? History of blood transfusion   ? "S/P tonsillectomy"  ? History of hiatal hernia   ? Hx of cardiovascular stress test April 2016  ? false positive Myoview  ? Hyperlipidemia   ? Hypertension   ? Migraine   ? "monthly" (05/26/2014)  ? Neuropathy   ? Osteoporosis   ? 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 "my PHT levels run high; I can't take calcium"  ? Pneumonia "several times"  ? Renal cancer ([place])   ? "right"-s/p nephrectomy  ? Renal insufficiency   ? "left kidney works at 40-60%" (05/26/2014)  ? Sleep apnea   ? "wore mask for 2 months; could not take it" (05/26/2014)  ? Type II diabetes mellitus ([place])   ? Typhus fever   ? "as a child"  ? ? ?Family History  ?Problem Relation Age of Onset  ? Emphysema Father   ? Heart disease Father   ? Clotting disorder Mother   ? Diabetes Mother   ? Kidney disease Mother   ? Allergies Other   ?     whole family per pt  ? Lung cancer Maternal Uncle   ? Alcohol abuse Maternal Uncle   ? ? ?SOCIAL  HISTORY: ?Social History  ? ?Socioeconomic History  ? Marital status: Married  ?  Spouse name: Not on file  ? Number of children: Not on file  ? Years of education: Not on file  ? Highest education level: Not on file  ?Occupational History  ? Occupation: Housewife  ?Tobacco Use  ? Smoking status: Never  ? Smokeless tobacco: Never  ?Vaping Use  ? Vaping Use: Never used  ?Substance and Sexual Activity  ? Alcohol use: No  ?  Alcohol/week: 0.0 standard drinks  ? Drug use: No  ? Sexual activity: Yes  ?  Birth control/protection: Surgical  ?Other Topics Concern  ? Not on file  ?Social History Narrative  ? Not on file  ? ?Social Determinants of Health  ? ?Financial Resource Strain: Not on file  ?Food Insecurity: Not on file  ?Transportation Needs: Not on file  ?Physical Activity: Not on file  ?Stress: Not on file  ?Social Connections: Not on file  ?Intimate Partner Violence: Not on file  ? ? ?Allergies  ?Allergen Reactions  ? Demerol [Meperidine] Shortness Of Breath  ?  Stop breathing  ? Hydrocodone-Acetaminophen  Shortness Of Breath  ?  REACTION: stop breathing  ? Meperidine Hcl Shortness Of Breath  ?  REACTION: stop breathing  ? Vicodin [Hydrocodone-Acetaminophen] Shortness Of Breath  ?  Stop breathing  ? Ace Inhibitors Cough  ? Amlodipine Swelling  ? Codeine   ?  REACTION: hallucination, confused  ? Contrast Media [Iodinated Contrast Media]   ?  MRI dye. Shaky, nausea  ? Fentanyl Itching  ? Hydromorphone Hcl   ?  REACTION: itching, rash, nausea, vomiting  ? Morphine   ?  REACTION: vomiting  ? Naproxen   ?  REACTION: stomach lesion  ? Dilaudid [Hydromorphone Hcl] Itching, Nausea And Vomiting and Rash  ? ? ?Current Outpatient Medications  ?Medication Sig Dispense Refill  ? acetaminophen (TYLENOL) 325 MG tablet Take 2 tablets (650 mg total) by mouth every 4 (four) hours as needed for headache or mild pain.    ? allopurinol (ZYLOPRIM) 300 MG tablet Take 300 mg by mouth daily.   12  ? atenolol (TENORMIN) 50 MG tablet Take 50  mg by mouth 2 (two) times daily.    ? docusate sodium (COLACE) 50 MG capsule Take 50 mg by mouth daily.    ? DULoxetine (CYMBALTA) 60 MG capsule Take 60 mg by mouth daily.    ? furosemide (LASIX) 40 MG tablet Take 40 mg by mouth 2 (two) times daily.    ? hydrALAZINE (APRESOLINE) 25 MG tablet Take 1 tablet (25 mg total) by mouth 2 (two) times daily. 60 tablet 6  ? Hyoscyamine Sulfate SL (LEVSIN/SL) 0.125 MG SUBL Place 1 tablet under the tongue 3 (three) times daily as needed. 60 tablet 1  ? insulin glargine (LANTUS) 100 UNIT/ML injection Inject 6 Units into the skin daily.    ? isosorbide dinitrate (ISORDIL) 10 MG tablet TAKE 2 TABLETS BY MOUTH IN THE MORNING AND 1 TAB IN THE EVENING . APPOINTMENT REQUIRED FOR FUTURE REFILLS 90 tablet 0  ? loperamide (IMODIUM A-D) 2 MG tablet Take 0.5 tablets (1 mg total) by mouth daily with breakfast. 30 tablet 0  ? mirabegron ER (MYRBETRIQ) 25 MG TB24 tablet Take 1 tablet (25 mg total) by mouth daily. 30 tablet 0  ? nitrofurantoin, macrocrystal-monohydrate, (MACROBID) 100 MG capsule Take 1 capsule (100 mg total) by mouth every 12 (twelve) hours. 14 capsule 0  ? NITROSTAT 0.4 MG SL tablet Place 1 tablet (0.4 mg total) under the tongue as needed. 25 tablet 3  ? rosuvastatin (CRESTOR) 10 MG tablet Take 1 tablet (10 mg total) by mouth daily.    ? ?No current facility-administered medications for this visit.  ? ? ?REVIEW OF SYSTEMS:  ?'[X]'$  denotes positive finding, '[ ]'$  denotes negative finding ?Cardiac  Comments:  ?Chest pain or chest pressure:    ?Shortness of breath upon exertion: x   ?Short of breath when lying flat:    ?Irregular heart rhythm:    ?    ?Vascular    ?Pain in calf, thigh, or hip brought on by ambulation: x   ?Pain in feet at night that wakes you up from your sleep:  x   ?Blood clot in your veins:    ?Leg swelling:  x   ?    ?Pulmonary    ?Oxygen at home:    ?Productive cough:     ?Wheezing:     ?    ?Neurologic    ?Sudden weakness in arms or legs:  x   ?Sudden numbness  in arms or legs:  x   ?  Sudden onset of difficulty speaking or slurred speech:    ?Temporary loss of vision in one eye:     ?Problems with dizziness:  x    ?    ?Gastrointestinal    ?Blood in stool:     ?Vomited blood:     ?    ?Genitourinary    ?Burning when urinating:     ?Blood in urine:    ?    ?Psychiatric    ?Major depression:     ?    ?Hematologic    ?Bleeding problems:    ?Problems with blood clotting too easily:    ?    ?Skin    ?Rashes or ulcers:    ?    ?Constitutional    ?Fever or chills:    ? ? ?PHYSICAL EXAM: ?Vitals:  ? 05/24/21 0932  ?BP: (!) 162/85  ?Pulse: (!) 59  ?Resp: 14  ?Temp: (!) 97.2 ?F (36.2 ?C)  ?TempSrc: Temporal  ?SpO2: 96%  ?Weight: 169 lb 12.8 oz (77 kg)  ?Height: '5\' 2"'$  (1.575 m)  ? ? ?GENERAL: The patient is a well-nourished female, in no acute distress. The vital signs are documented above. ?CARDIOVASCULAR: 2+ radial pulses bilaterally.  Normal surface veins bilaterally visible antecubital veins bilaterally. ?PULMONARY: There is good air exchange  ?MUSCULOSKELETAL: There are no major deformities or cyanosis. ?NEUROLOGIC: No focal weakness or paresthesias are detected. ?SKIN: There are no ulcers or rashes noted. ?PSYCHIATRIC: The patient has a normal affect. ? ?DATA:  ?I imaged her veins in both arms with SonoSite ultrasound.  She does have a very small cephalic vein in the forearm.  She has moderate cephalic vein at the antecubital space with small to moderate cephalic vein in the upper arm bilaterally.  She has a large basilic vein antecubital space proximally on the left right ? ?MEDICAL ISSUES: ?Had a long discussion with the patient and her husband regarding access for him and [place].  Tunneled catheter for urgent hemodialysis limitation potential infection related to this.  Discussed AV fistula and AV graft advantages and disadvantages of each of the's.  She does appear to be a candidate for left arm fistula.  She has marginal size cephalic vein left upper arm.  She has  excellent size basilic vein in the left upper arm.  Would recommend left arm AV fistula either cephalic or basilic depending on vein size at the time of surgery.  She understands this is a outpatient procedure at

## 2021-05-25 ENCOUNTER — Other Ambulatory Visit: Payer: Self-pay

## 2021-06-01 DIAGNOSIS — I5032 Chronic diastolic (congestive) heart failure: Secondary | ICD-10-CM | POA: Diagnosis not present

## 2021-06-01 DIAGNOSIS — N189 Chronic kidney disease, unspecified: Secondary | ICD-10-CM | POA: Diagnosis not present

## 2021-06-01 DIAGNOSIS — E1122 Type 2 diabetes mellitus with diabetic chronic kidney disease: Secondary | ICD-10-CM | POA: Diagnosis not present

## 2021-06-01 DIAGNOSIS — N170 Acute kidney failure with tubular necrosis: Secondary | ICD-10-CM | POA: Diagnosis not present

## 2021-06-01 DIAGNOSIS — R808 Other proteinuria: Secondary | ICD-10-CM | POA: Diagnosis not present

## 2021-06-06 ENCOUNTER — Encounter (INDEPENDENT_AMBULATORY_CARE_PROVIDER_SITE_OTHER): Payer: Self-pay | Admitting: Internal Medicine

## 2021-06-06 ENCOUNTER — Ambulatory Visit (INDEPENDENT_AMBULATORY_CARE_PROVIDER_SITE_OTHER): Payer: Medicare Other | Admitting: Internal Medicine

## 2021-06-06 VITALS — BP 113/64 | HR 66 | Temp 98.1°F | Ht 62.0 in | Wt 170.9 lb

## 2021-06-06 DIAGNOSIS — D126 Benign neoplasm of colon, unspecified: Secondary | ICD-10-CM | POA: Insufficient documentation

## 2021-06-06 DIAGNOSIS — K529 Noninfective gastroenteritis and colitis, unspecified: Secondary | ICD-10-CM

## 2021-06-06 DIAGNOSIS — Z860100 Personal history of colon polyps, unspecified: Secondary | ICD-10-CM

## 2021-06-06 NOTE — Progress Notes (Signed)
Presenting complaint; ? ?Follow-up for chronic diarrhea. ? ?Database and subjective: ? ?Patient is 76 year old Caucasian female who is here for scheduled visit.  She was last seen on 06/06/2020. ?She has a history of chronic diarrhea for which she has been thoroughly evaluated in the past.  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  Trial with Xifaxan was recommended past but her co-pay was too high and she never took [place]. ?She also has a history of colonic polyp.  Colonoscopy in November 2011 revealed sessile serrated polyp in the transverse colon.  Colonoscopy in September 2016 did not reveal any polyps.  She was advised to return for another exam in 7 years. ? ?Patient states his stool frequency has not changed.  She has 3 bowel movements on her best days and she has as many as 10 on her worst days.  Stool frequency increases when she does not watch her diet like she did over [place] weekend when they had family get together.  She denies melena or rectal bleeding.  She does not have a good appetite.  She says she is force-feeding herself.  She avoids dairy products which increase stool frequency.  She is using loperamide only on as-needed basis.  She usually takes loperamide when she has to leave house.  She has heartburn with certain foods which is not often and has occasional dysphagia.  She does not feel dysphagia is significant enough that we should proceed with further testing. ?She says her renal function has been getting worse.  She is scheduled to have AV fistula placed by Dr. Sherren Mocha early next week. ?She states her mother also had end-stage renal disease.  Patient only has 1 kidney as she lost 1 kidney due to renal cancer back in 2001.  Right kidney was removed. ?She says her insulin was discontinued in November 2022.  Last A1c was 6.8. ?She states she is taking Cymbalta for depression.  Before she went on this medication she would cry all day long. ? ?Current  Medications: ?Outpatient Encounter Medications as of 06/06/2021  ?Medication Sig  ? acetaminophen (TYLENOL) 500 MG tablet Take 500 mg by mouth every 6 (six) hours as needed (for pain.).  ? allopurinol (ZYLOPRIM) 300 MG tablet Take 300 mg by mouth in the morning.  ? ALPRAZolam (XANAX) 0.25 MG tablet Take 0.25 mg by mouth 2 (two) times daily as needed for anxiety.  ? aspirin EC 81 MG tablet Take 81 mg by mouth in the morning. Swallow whole.  ? atenolol (TENORMIN) 50 MG tablet Take 50 mg by mouth 2 (two) times daily.  ? calcitRIOL (ROCALTROL) 0.25 MCG capsule Take 0.25 mcg by mouth in the morning.  ? Cholecalciferol (VITAMIN D3) 250 MCG (10000 UT) TABS Take 10,000 Units by mouth in the morning.  ? docusate sodium (COLACE) 50 MG capsule Take 50 mg by mouth at bedtime.  ? DULoxetine (CYMBALTA) 60 MG capsule Take 60 mg by mouth in the morning.  ? furosemide (LASIX) 40 MG tablet Take 40 mg by mouth See admin instructions. Take 2 tablets (80 mg) by mouth in the morning & take 1 tablet (40 mg) by mouth at night.  ? hydrALAZINE (APRESOLINE) 50 MG tablet Take 100 mg by mouth in the morning, at noon, and at bedtime.  ? isosorbide dinitrate (ISORDIL) 10 MG tablet TAKE 2 TABLETS BY MOUTH IN THE MORNING AND 1 TAB IN THE EVENING . APPOINTMENT REQUIRED FOR FUTURE REFILLS  ? loperamide (IMODIUM A-D)  2 MG tablet Take 0.5 tablets (1 mg total) by mouth daily with breakfast. (Patient taking differently: Take 1-4 mg by mouth 4 (four) times daily as needed for diarrhea or loose stools.)  ? NITROSTAT 0.4 MG SL tablet Place 1 tablet (0.4 mg total) under the tongue as needed.  ? rosuvastatin (CRESTOR) 10 MG tablet Take 1 tablet (10 mg total) by mouth daily.  ? sodium bicarbonate 650 MG tablet Take 650 mg by mouth 2 (two) times daily.  ? ?No facility-administered encounter medications on file as of 06/06/2021.  ? ? ? ?Objective: ?Blood pressure 113/64, pulse 66, temperature 98.1 ?F (36.7 ?C), temperature source Oral, height '5\' 2"'$  (1.575 m),  weight 170 lb 14.4 oz (77.5 kg). ?Patient is alert and in no acute distress. ?Conjunctiva is pink. Sclera is nonicteric ?Oropharyngeal mucosa is normal. ?No neck masses or thyromegaly noted. ?Cardiac exam with regular rhythm normal S1 and S2. No murmur or gallop noted. ?Lungs are clear to auscultation. ?Abdomen is symmetrical.  She has long midline scar as well as horizontal scar in right upper quadrant.  On palpation abdomen is soft and nontender with organomegaly or masses. ?No LE edema or clubbing noted. ? ?Labs/studies Results: ? ?No recent lab data on file. ? ? ?Assessment: ? ?#1.  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  Work-up in the past has been negative for microscopic colitis.  She is getting relief with loperamide that she uses on as-needed basis.  If she wants to she can take 2 mg every morning and see if it works better than as needed schedule. ? ?#2.  History of sessile serrated polyp(November 2011).  Colonoscopy in September 2016 did not reveal any polyps.  It would be reasonable to perform another surveillance colonoscopy given her history. ? ? ? ?Plan: ? ?Continue loperamide OTC 2 mg daily as needed. ?Surveillance colonoscopy in October 2023. ?Office visit in 1 year. ? ? ? ? ? ?

## 2021-06-06 NOTE — Patient Instructions (Signed)
Colonoscopy to be scheduled in October 2023 ?

## 2021-06-09 ENCOUNTER — Encounter (HOSPITAL_COMMUNITY): Payer: Self-pay

## 2021-06-09 ENCOUNTER — Encounter (HOSPITAL_COMMUNITY)
Admission: RE | Admit: 2021-06-09 | Discharge: 2021-06-09 | Disposition: A | Discharge status: Home / Self Care | Payer: Medicare Other | Source: Ambulatory Visit | Attending: Vascular Surgery | Admitting: Vascular Surgery

## 2021-06-09 ENCOUNTER — Other Ambulatory Visit: Payer: Self-pay

## 2021-06-09 NOTE — Pre-Procedure Instructions (Signed)
Attempted pre-op phone call. Left message for her to call us back. ?

## 2021-06-13 ENCOUNTER — Other Ambulatory Visit: Payer: Self-pay

## 2021-06-13 ENCOUNTER — Ambulatory Visit (HOSPITAL_COMMUNITY)
Admission: RE | Admit: 2021-06-13 | Discharge: 2021-06-13 | Disposition: A | Discharge status: Home / Self Care | Payer: Medicare Other | Attending: Vascular Surgery | Admitting: Vascular Surgery

## 2021-06-13 ENCOUNTER — Ambulatory Visit (HOSPITAL_COMMUNITY): Payer: Medicare Other | Admitting: Certified Registered"

## 2021-06-13 ENCOUNTER — Encounter (HOSPITAL_COMMUNITY)
Admission: RE | Disposition: A | Discharge status: Home / Self Care | Payer: Self-pay | Source: Home / Self Care | Attending: Vascular Surgery

## 2021-06-13 ENCOUNTER — Encounter (HOSPITAL_COMMUNITY): Payer: Self-pay | Admitting: Vascular Surgery

## 2021-06-13 ENCOUNTER — Ambulatory Visit (HOSPITAL_BASED_OUTPATIENT_CLINIC_OR_DEPARTMENT_OTHER): Payer: Medicare Other | Admitting: Certified Registered"

## 2021-06-13 DIAGNOSIS — E1122 Type 2 diabetes mellitus with diabetic chronic kidney disease: Secondary | ICD-10-CM

## 2021-06-13 DIAGNOSIS — N189 Chronic kidney disease, unspecified: Secondary | ICD-10-CM

## 2021-06-13 DIAGNOSIS — N185 Chronic kidney disease, stage 5: Secondary | ICD-10-CM | POA: Diagnosis not present

## 2021-06-13 DIAGNOSIS — Z794 Long term (current) use of insulin: Secondary | ICD-10-CM | POA: Diagnosis not present

## 2021-06-13 DIAGNOSIS — F32A Depression, unspecified: Secondary | ICD-10-CM | POA: Insufficient documentation

## 2021-06-13 DIAGNOSIS — F419 Anxiety disorder, unspecified: Secondary | ICD-10-CM | POA: Insufficient documentation

## 2021-06-13 DIAGNOSIS — N186 End stage renal disease: Secondary | ICD-10-CM | POA: Diagnosis not present

## 2021-06-13 DIAGNOSIS — K449 Diaphragmatic hernia without obstruction or gangrene: Secondary | ICD-10-CM | POA: Insufficient documentation

## 2021-06-13 DIAGNOSIS — I129 Hypertensive chronic kidney disease with stage 1 through stage 4 chronic kidney disease, or unspecified chronic kidney disease: Secondary | ICD-10-CM

## 2021-06-13 DIAGNOSIS — G709 Myoneural disorder, unspecified: Secondary | ICD-10-CM | POA: Insufficient documentation

## 2021-06-13 DIAGNOSIS — G4733 Obstructive sleep apnea (adult) (pediatric): Secondary | ICD-10-CM | POA: Diagnosis not present

## 2021-06-13 DIAGNOSIS — D631 Anemia in chronic kidney disease: Secondary | ICD-10-CM | POA: Diagnosis not present

## 2021-06-13 DIAGNOSIS — G4730 Sleep apnea, unspecified: Secondary | ICD-10-CM | POA: Diagnosis not present

## 2021-06-13 DIAGNOSIS — I120 Hypertensive chronic kidney disease with stage 5 chronic kidney disease or end stage renal disease: Secondary | ICD-10-CM | POA: Diagnosis not present

## 2021-06-13 DIAGNOSIS — N184 Chronic kidney disease, stage 4 (severe): Secondary | ICD-10-CM

## 2021-06-13 HISTORY — PX: AV FISTULA PLACEMENT: SHX1204

## 2021-06-13 LAB — POCT I-STAT, CHEM 8
BUN: 64 mg/dL — ABNORMAL HIGH (ref 8–23)
Calcium, Ion: 1.07 mmol/L — ABNORMAL LOW (ref 1.15–1.40)
Chloride: 106 mmol/L (ref 98–111)
Creatinine, Ser: 4 mg/dL — ABNORMAL HIGH (ref 0.44–1.00)
Glucose, Bld: 158 mg/dL — ABNORMAL HIGH (ref 70–99)
HCT: 37 % (ref 36.0–46.0)
Hemoglobin: 12.6 g/dL (ref 12.0–15.0)
Potassium: 4.8 mmol/L (ref 3.5–5.1)
Sodium: 135 mmol/L (ref 135–145)
TCO2: 25 mmol/L (ref 22–32)

## 2021-06-13 SURGERY — ARTERIOVENOUS (AV) FISTULA CREATION
Anesthesia: General | Site: Arm Lower | Laterality: Left

## 2021-06-13 MED ORDER — KETAMINE HCL 50 MG/5ML IJ SOSY
PREFILLED_SYRINGE | INTRAMUSCULAR | Status: AC
  Filled 2021-06-13: qty 5

## 2021-06-13 MED ORDER — LIDOCAINE HCL (CARDIAC) PF 100 MG/5ML IV SOSY
PREFILLED_SYRINGE | INTRAVENOUS | Status: DC | PRN
  Administered 2021-06-13: 30 mg via INTRAVENOUS

## 2021-06-13 MED ORDER — DIPHENHYDRAMINE HCL 50 MG/ML IJ SOLN
INTRAMUSCULAR | Status: AC
  Filled 2021-06-13: qty 1

## 2021-06-13 MED ORDER — MIDAZOLAM HCL 2 MG/2ML IJ SOLN
INTRAMUSCULAR | Status: AC
  Filled 2021-06-13: qty 2

## 2021-06-13 MED ORDER — LIDOCAINE-EPINEPHRINE 0.5 %-1:200000 IJ SOLN
INTRAMUSCULAR | Status: DC | PRN
  Administered 2021-06-13: 7 mL

## 2021-06-13 MED ORDER — LIDOCAINE-EPINEPHRINE 0.5 %-1:200000 IJ SOLN
INTRAMUSCULAR | Status: AC
  Filled 2021-06-13: qty 1

## 2021-06-13 MED ORDER — CEFAZOLIN SODIUM-DEXTROSE 2-4 GM/100ML-% IV SOLN
2.0000 g | INTRAVENOUS | Status: AC
  Administered 2021-06-13: 2 g via INTRAVENOUS
  Filled 2021-06-13: qty 100

## 2021-06-13 MED ORDER — HEPARIN SODIUM (PORCINE) 1000 UNIT/ML IJ SOLN
INTRAMUSCULAR | Status: AC
  Filled 2021-06-13: qty 6

## 2021-06-13 MED ORDER — CHLORHEXIDINE GLUCONATE 0.12 % MT SOLN
15.0000 mL | Freq: Once | OROMUCOSAL | Status: AC
  Administered 2021-06-13: 15 mL via OROMUCOSAL

## 2021-06-13 MED ORDER — FENTANYL CITRATE PF 50 MCG/ML IJ SOSY: 25.0000 ug | PREFILLED_SYRINGE | INTRAMUSCULAR | Status: DC | PRN

## 2021-06-13 MED ORDER — DIPHENHYDRAMINE HCL 50 MG/ML IJ SOLN
INTRAMUSCULAR | Status: DC | PRN
  Administered 2021-06-13: 8 mg via INTRAVENOUS

## 2021-06-13 MED ORDER — CHLORHEXIDINE GLUCONATE 4 % EX LIQD: 60.0000 mL | Freq: Once | CUTANEOUS | Status: DC

## 2021-06-13 MED ORDER — LACTATED RINGERS IV SOLN: INTRAVENOUS | Status: DC

## 2021-06-13 MED ORDER — SODIUM CHLORIDE 0.9 % IV SOLN: INTRAVENOUS | Status: DC

## 2021-06-13 MED ORDER — HEPARIN 6000 UNIT IRRIGATION SOLUTION
Status: DC | PRN
Start: 2021-06-13 — End: 2021-06-13
  Administered 2021-06-13: 1

## 2021-06-13 MED ORDER — STERILE WATER FOR IRRIGATION IR SOLN
Status: DC | PRN
  Administered 2021-06-13: 500 mL

## 2021-06-13 MED ORDER — PROPOFOL 10 MG/ML IV BOLUS
INTRAVENOUS | Status: AC
  Filled 2021-06-13: qty 20

## 2021-06-13 MED ORDER — PROPOFOL 500 MG/50ML IV EMUL
INTRAVENOUS | Status: DC | PRN
Start: 2021-06-13 — End: 2021-06-13
  Administered 2021-06-13: 75 ug/kg/min via INTRAVENOUS

## 2021-06-13 MED ORDER — FENTANYL CITRATE (PF) 100 MCG/2ML IJ SOLN
INTRAMUSCULAR | Status: AC
  Filled 2021-06-13: qty 2

## 2021-06-13 MED ORDER — ORAL CARE MOUTH RINSE: 15.0000 mL | Freq: Once | OROMUCOSAL | Status: AC

## 2021-06-13 MED ORDER — ONDANSETRON HCL 4 MG/2ML IJ SOLN: 4.0000 mg | Freq: Once | INTRAMUSCULAR | Status: DC | PRN

## 2021-06-13 MED ORDER — 0.9 % SODIUM CHLORIDE (POUR BTL) OPTIME
TOPICAL | Status: DC | PRN
  Administered 2021-06-13: 1000 mL

## 2021-06-13 MED ORDER — EPHEDRINE SULFATE-NACL 50-0.9 MG/10ML-% IV SOSY
PREFILLED_SYRINGE | INTRAVENOUS | Status: DC | PRN
  Administered 2021-06-13 (×2): 5 mg via INTRAVENOUS

## 2021-06-13 MED ORDER — VASOPRESSIN 20 UNIT/ML IV SOLN
INTRAVENOUS | Status: AC
  Filled 2021-06-13: qty 1

## 2021-06-13 SURGICAL SUPPLY — 42 items
ADH SKN CLS APL DERMABOND .7 (GAUZE/BANDAGES/DRESSINGS) ×1
ARMBAND PINK RESTRICT EXTREMIT (MISCELLANEOUS) ×3 IMPLANT
BAG HAMPER (MISCELLANEOUS) ×3 IMPLANT
CANNULA VESSEL 3MM 2 BLNT TIP (CANNULA) ×3 IMPLANT
CLIP LIGATING EXTRA MED SLVR (CLIP) ×3 IMPLANT
CLIP LIGATING EXTRA SM BLUE (MISCELLANEOUS) ×3 IMPLANT
COVER LIGHT HANDLE STERIS (MISCELLANEOUS) ×6 IMPLANT
COVER MAYO STAND XLG (MISCELLANEOUS) ×3 IMPLANT
DECANTER SPIKE VIAL GLASS SM (MISCELLANEOUS) ×3 IMPLANT
DERMABOND ADVANCED (GAUZE/BANDAGES/DRESSINGS) ×1
DERMABOND ADVANCED .7 DNX12 (GAUZE/BANDAGES/DRESSINGS) ×2 IMPLANT
ELECT REM PT RETURN 9FT ADLT (ELECTROSURGICAL) ×2
ELECTRODE REM PT RTRN 9FT ADLT (ELECTROSURGICAL) ×2 IMPLANT
GAUZE SPONGE 4X4 12PLY STRL (GAUZE/BANDAGES/DRESSINGS) ×3 IMPLANT
GLOVE BIO SURGEON STRL SZ7 (GLOVE) ×1 IMPLANT
GLOVE BIOGEL PI IND STRL 7.0 (GLOVE) ×4 IMPLANT
GLOVE BIOGEL PI INDICATOR 7.0 (GLOVE) ×2
GLOVE SURG MICRO LTX SZ7.5 (GLOVE) ×3 IMPLANT
GOWN STRL REUS W/TWL LRG LVL3 (GOWN DISPOSABLE) ×9 IMPLANT
IV NS 500ML (IV SOLUTION) ×2
IV NS 500ML BAXH (IV SOLUTION) ×2 IMPLANT
KIT BLADEGUARD II DBL (SET/KITS/TRAYS/PACK) ×3 IMPLANT
KIT TURNOVER KIT A (KITS) ×3 IMPLANT
MANIFOLD NEPTUNE II (INSTRUMENTS) ×3 IMPLANT
MARKER SKIN DUAL TIP RULER LAB (MISCELLANEOUS) ×6 IMPLANT
NDL HYPO 18GX1.5 BLUNT FILL (NEEDLE) ×2 IMPLANT
NEEDLE HYPO 18GX1.5 BLUNT FILL (NEEDLE) ×2 IMPLANT
NS IRRIG 1000ML POUR BTL (IV SOLUTION) ×3 IMPLANT
PACK CV ACCESS (CUSTOM PROCEDURE TRAY) ×3 IMPLANT
PAD ARMBOARD 7.5X6 YLW CONV (MISCELLANEOUS) ×3 IMPLANT
SET BASIN LINEN APH (SET/KITS/TRAYS/PACK) ×3 IMPLANT
SOL PREP POV-IOD 4OZ 10% (MISCELLANEOUS) ×3 IMPLANT
SOL PREP PROV IODINE SCRUB 4OZ (MISCELLANEOUS) ×3 IMPLANT
SPONGE T-LAP 18X18 ~~LOC~~+RFID (SPONGE) ×3 IMPLANT
SUT PROLENE 6 0 CC (SUTURE) ×4 IMPLANT
SUT SILK 2 0 SH (SUTURE) IMPLANT
SUT VIC AB 3-0 SH 27 (SUTURE) ×2
SUT VIC AB 3-0 SH 27X BRD (SUTURE) ×2 IMPLANT
SYR 10ML LL (SYRINGE) ×3 IMPLANT
SYR 50ML LL SCALE MARK (SYRINGE) ×2 IMPLANT
UNDERPAD 30X36 HEAVY ABSORB (UNDERPADS AND DIAPERS) ×3 IMPLANT
WATER STERILE IRR 500ML POUR (IV SOLUTION) ×1 IMPLANT

## 2021-06-13 NOTE — Transfer of Care (Signed)
Immediate Anesthesia Transfer of Care Note ? ?Patient: KEYAH BLIZARD ? ?Procedure(s) Performed: LEFT ARM ARTERIOVENOUS FISTULA CREATION (Left: Arm Lower) ? ?Patient Location: PACU ? ?Anesthesia Type:MAC ? ?Level of Consciousness: drowsy and patient cooperative ? ?Airway & Oxygen Therapy: Patient Spontanous Breathing ? ?Post-op Assessment: Report given to RN and Post -op Vital signs reviewed and stable ? ?Post vital signs: Reviewed and stable ? ?Last Vitals:  ?Vitals Value Taken Time  ?BP 169/77 06/13/21 0851  ?Temp    ?Pulse 62 06/13/21 0853  ?Resp 17 06/13/21 0853  ?SpO2 96 % 06/13/21 0853  ?Vitals shown include unvalidated device data. ? ?Last Pain:  ?Vitals:  ? 06/13/21 0653  ?TempSrc: Oral  ?PainSc: 0-No pain  ?   ? ?Patients Stated Pain Goal: 9 (06/13/21 1027) ? ?Complications: No notable events documented. ?

## 2021-06-13 NOTE — Discharge Instructions (Signed)
? ?[facility] [place] ? ?Discharge Instructions ? ?AV Fistula or Graft Surgery for Dialysis Access ? ?Please refer to the following instructions for your post-procedure care. Your surgeon or physician assistant will discuss any changes with you. ? ?Activity ? ?You may drive the day following your surgery, if you are comfortable and no longer taking prescription pain medication. Resume full activity as the soreness in your incision resolves. ? ?Bathing/Showering ? ?You may shower after you go home. Keep your incision dry for 48 hours. Do not soak in a bathtub, hot tub, or swim until the incision heals completely. You may not shower if you have a hemodialysis catheter. ? ?Incision Care ? ?Clean your incision with mild soap and water after 48 hours. Pat the area dry with a clean towel. You do not need a bandage unless otherwise instructed. Do not apply any ointments or creams to your incision. You may have skin glue on your incision. Do not peel it off. It will come off on its own in about one week. Your arm may swell a bit after surgery. To reduce swelling use pillows to elevate your arm so it is above your heart. Your doctor will tell you if you need to lightly wrap your arm with an ACE bandage. ? ?Diet ? ?Resume your normal diet. There are not special food restrictions following this procedure. In order to heal from your surgery, it is CRITICAL to get adequate nutrition. Your body requires vitamins, minerals, and protein. Vegetables are the best source of vitamins and minerals. Vegetables also provide the perfect balance of protein. Processed food has little nutritional value, so try to avoid this. ? ?Medications ? ?Resume taking all of your medications. If your incision is causing pain, you may take over-the counter pain relievers such as acetaminophen (Tylenol). If you were prescribed a stronger pain medication, please be aware these medications can cause nausea and constipation. Prevent  nausea by taking the medication with a snack or meal. Avoid constipation by drinking plenty of fluids and eating foods with high amount of fiber, such as fruits, vegetables, and grains.  ?Do not take Tylenol if you are taking prescription pain medications. ? ?Follow up ?Your surgeon may want to see you in the office following your access surgery. If so, this will be arranged at the time of your surgery. ? ?Please call us immediately for any of the following conditions: ? ?Increased pain, redness, drainage (pus) from your incision site ?Fever of 101 degrees or higher ?Severe or worsening pain at your incision site ?Hand pain or numbness. ? ?Reduce your risk of vascular disease: ? ?Stop smoking. If you would like help, call [facility] at 1-800-QUIT-NOW (775)454-5773) or [facility] at [phone] ? ?Manage your cholesterol ?Maintain a desired weight ?Control your diabetes ?Keep your blood pressure down ? ?Dialysis ? ?It will take several weeks to several months for your new dialysis access to be ready for use. Your surgeon will determine when it is okay to use it. Your nephrologist will continue to direct your dialysis. You can continue to use your Permcath until your new access is ready for use. ? ? ?06/13/2021 ?Margaret Huerta ?[id number] ?12-27-1945 ? ?Surgeon(s): ?Cheyla Duchemin, Arvilla Meres, MD ? ?Procedure(s): ?LEFT ARM ARTERIOVENOUS FISTULA CREATION ? ? May stick graft immediately  ? May stick graft on designated area only:   ? Do not stick fistula for 12 weeks  ? ? ?If you have any questions, please call the office at [phone]. ? ?

## 2021-06-13 NOTE — Anesthesia Postprocedure Evaluation (Signed)
Anesthesia Post Note ? ?Patient: JIHAN MELLETTE ? ?Procedure(s) Performed: LEFT ARM ARTERIOVENOUS FISTULA CREATION (Left: Arm Lower) ? ?Patient location during evaluation: Phase II ?Anesthesia Type: General ?Level of consciousness: awake ?Pain management: pain level controlled ?Vital Signs Assessment: post-procedure vital signs reviewed and stable ?Respiratory status: spontaneous breathing and respiratory function stable ?Cardiovascular status: blood pressure returned to baseline and stable ?Postop Assessment: no headache and no apparent nausea or vomiting ?Anesthetic complications: no ?Comments: Late entry ? ? ?No notable events documented. ? ? ?Last Vitals:  ?Vitals:  ? 06/13/21 0915 06/13/21 0926  ?BP: (!) 167/78 (!) 172/80  ?Pulse: (!) 59 (!) 58  ?Resp: 11 13  ?Temp:    ?SpO2: 97% 97%  ?  ?Last Pain:  ?Vitals:  ? 06/13/21 0926  ?TempSrc:   ?PainSc: 0-No pain  ? ? ?  ?  ?  ?  ?  ?  ? ?Louann Sjogren ? ? ? ? ?

## 2021-06-13 NOTE — Interval H&P Note (Signed)
History and Physical Interval Note: ? ?06/13/2021 ?7:09 AM ? ?Margaret Huerta  has presented today for surgery, with the diagnosis of CKD IV.  The various methods of treatment have been discussed with the patient and family. After consideration of risks, benefits and other options for treatment, the patient has consented to  Procedure(s): ?LEFT ARM ARTERIOVENOUS (AV) FISTULA VERSUS GRAFT CREATION (Left) as a surgical intervention.  The patient's history has been reviewed, patient examined, no change in status, stable for surgery.  I have reviewed the patient's chart and labs.  Questions were answered to the patient's satisfaction.   ? ? ?Margaret Huerta ? ? ?

## 2021-06-13 NOTE — Op Note (Signed)
? ? ?  OPERATIVE REPORT ? ?DATE OF SURGERY: 06/13/2021 ? ?PATIENT: Margaret Huerta, 76 y.o. female ?MRN: [id number]  ?DOB: March 08, 1945 ? ?PRE-OPERATIVE DIAGNOSIS: Chronic renal insufficiency ? ?POST-OPERATIVE DIAGNOSIS:  Same ? ?PROCEDURE: Left brachiocephalic AV fistula creation ? ?SURGEON:  Curt Jews, M.D. ? ?PHYSICIAN ASSISTANT: Vevelyn Royals, RNFA ? ?The assistant was needed for exposure and to expedite the case ? ?ANESTHESIA: Local with sedation ? ?EBL: per anesthesia record ? ?Total I/O ?In: 500 [I.V.:400; IV Piggyback:100] ?Out: 5 [Blood:5] ? ?BLOOD ADMINISTERED: none ? ?DRAINS: none ? ?SPECIMEN: none ? ?COUNTS CORRECT:  YES ? ?PATIENT DISPOSITION:  PACU - hemodynamically stable ? ?PROCEDURE DETAILS: ?The patient was taken operating placed supine position where the area of the left arm prepped draped you sterile fashion.  SonoSite ultrasound was used to visualize the arm veins.  Patient did have a large caliber antecubital vein and this did extend into the cephalic vein.  Patient had moderate size cephalic vein in the upper arm and this was comparable in size to the basilic vein on the medial aspect.  Decision was made to proceed with brachiocephalic AV fistula creation. ? ?Using local anesthesia, incision was made over the antecubital space and carried down to isolate the cephalic vein which was of good caliber.  Tributary branches were ligated with 4-0 silk ties and divided.  The vein was ligated distally and was divided and brought into approximation with the brachial artery.  The brachial artery is of normal size and had minimal atherosclerotic change.  The artery was occluded proximally and distally and was opened with an 11 blade and stented longitudinally with Potts scissors.  The vein was cut to the appropriate length and was spatulated and sewn end-to-side to the artery with a running 6-0 Prolene suture.  Clamps were removed and excellent thrill was noted.  The patient maintained a left radial pulse.  The  wound was irrigated with saline.  Hemostasis was obtained with electrocautery.  The wound was closed with 3-0 Vicryl in the subcutaneous and subcuticular tissue.  Dermabond was placed over the wound and the patient was transferred to the recovery room in stable condition ? ? ?Rosetta Posner, M.D., FACS ?06/13/2021 ?9:10 AM ? ?Note: Portions of this report may have been transcribed using voice recognition software.  Every effort has been made to ensure accuracy; however, inadvertent computerized transcription errors may still be present. ? ? ?

## 2021-06-13 NOTE — Anesthesia Preprocedure Evaluation (Signed)
Anesthesia Evaluation  ?Patient identified by MRN, date of birth, ID band ?Patient awake ? ? ? ?Reviewed: ?Allergy & Precautions, H&P , NPO status , Patient's Chart, lab work & pertinent test results, reviewed documented beta blocker date and time  ? ?Airway ?Mallampati: II ? ?TM Distance: >3 FB ?Neck ROM: full ? ? ? Dental ?no notable dental hx. ? ?  ?Pulmonary ?sleep apnea ,  ?  ?Pulmonary exam normal ?breath sounds clear to auscultation ? ? ? ? ? ? Cardiovascular ?Exercise Tolerance: Good ?hypertension, negative cardio ROS ? ? ?Rhythm:regular Rate:Normal ? ? ?  ?Neuro/Psych ? Headaches, Seizures -,  PSYCHIATRIC DISORDERS Anxiety Depression  Neuromuscular disease   ? GI/Hepatic ?Neg liver ROS, hiatal hernia, GERD  Medicated,  ?Endo/Other  ?negative endocrine ROSdiabetes ? Renal/GU ?CRF and ESRFRenal disease  ?negative genitourinary ?  ?Musculoskeletal ? ? Abdominal ?  ?Peds ? Hematology ? ?(+) Blood dyscrasia, anemia ,   ?Anesthesia Other Findings ? ? Reproductive/Obstetrics ?negative OB ROS ? ?  ? ? ? ? ? ? ? ? ? ? ? ? ? ?  ?  ? ? ? ? ? ? ? ? ?Anesthesia Physical ?Anesthesia Plan ? ?ASA: 3 ? ?Anesthesia Plan: General  ? ?Post-op Pain Management:   ? ?Induction:  ? ?PONV Risk Score and Plan: Propofol infusion ? ?Airway Management Planned:  ? ?Additional Equipment:  ? ?Intra-op Plan:  ? ?Post-operative Plan:  ? ?Informed Consent: I have reviewed the patients History and Physical, chart, labs and discussed the procedure including the risks, benefits and alternatives for the proposed anesthesia with the patient or authorized representative who has indicated his/her understanding and acceptance.  ? ? ? ?Dental Advisory Given ? ?Plan Discussed with: CRNA ? ?Anesthesia Plan Comments:   ? ? ? ? ? ? ?Anesthesia Quick Evaluation ? ?

## 2021-06-14 DIAGNOSIS — E211 Secondary hyperparathyroidism, not elsewhere classified: Secondary | ICD-10-CM | POA: Diagnosis not present

## 2021-06-14 DIAGNOSIS — R808 Other proteinuria: Secondary | ICD-10-CM | POA: Diagnosis not present

## 2021-06-14 DIAGNOSIS — I129 Hypertensive chronic kidney disease with stage 1 through stage 4 chronic kidney disease, or unspecified chronic kidney disease: Secondary | ICD-10-CM | POA: Diagnosis not present

## 2021-06-14 DIAGNOSIS — I770 Arteriovenous fistula, acquired: Secondary | ICD-10-CM | POA: Diagnosis not present

## 2021-06-14 DIAGNOSIS — I5032 Chronic diastolic (congestive) heart failure: Secondary | ICD-10-CM | POA: Diagnosis not present

## 2021-06-14 DIAGNOSIS — N170 Acute kidney failure with tubular necrosis: Secondary | ICD-10-CM | POA: Diagnosis not present

## 2021-06-14 DIAGNOSIS — E1122 Type 2 diabetes mellitus with diabetic chronic kidney disease: Secondary | ICD-10-CM | POA: Diagnosis not present

## 2021-06-14 DIAGNOSIS — N19 Unspecified kidney failure: Secondary | ICD-10-CM | POA: Diagnosis not present

## 2021-06-14 DIAGNOSIS — N189 Chronic kidney disease, unspecified: Secondary | ICD-10-CM | POA: Diagnosis not present

## 2021-06-16 ENCOUNTER — Encounter (HOSPITAL_COMMUNITY): Payer: Self-pay | Admitting: Vascular Surgery

## 2021-07-12 ENCOUNTER — Other Ambulatory Visit (HOSPITAL_COMMUNITY): Payer: Self-pay | Admitting: Vascular Surgery

## 2021-07-12 ENCOUNTER — Encounter: Payer: Self-pay | Admitting: Vascular Surgery

## 2021-07-12 ENCOUNTER — Ambulatory Visit (INDEPENDENT_AMBULATORY_CARE_PROVIDER_SITE_OTHER): Payer: Medicare Other

## 2021-07-12 ENCOUNTER — Other Ambulatory Visit: Payer: Self-pay | Admitting: Vascular Surgery

## 2021-07-12 ENCOUNTER — Ambulatory Visit (INDEPENDENT_AMBULATORY_CARE_PROVIDER_SITE_OTHER): Payer: Medicare Other | Admitting: Vascular Surgery

## 2021-07-12 VITALS — BP 118/74 | HR 60 | Temp 97.7°F | Resp 16 | Ht 62.0 in | Wt 164.0 lb

## 2021-07-12 DIAGNOSIS — Z48812 Encounter for surgical aftercare following surgery on the circulatory system: Secondary | ICD-10-CM

## 2021-07-12 DIAGNOSIS — N184 Chronic kidney disease, stage 4 (severe): Secondary | ICD-10-CM

## 2021-07-12 NOTE — Progress Notes (Signed)
[facility] and [facility]ist [facility] [place]  Patient name: Margaret Huerta MRN: [id number] DOB: 08-Nov-1945 Sex: female  REASON FOR VISIT: Follow-up left brachiocephalic fistula creation on 06/13/2021  Nephrologist: Dr. [name]  HPI: Margaret Huerta is a 76 y.o. female here today for follow-up.  She has chronic renal insufficiency and is not on hemodialysis.  She underwent outpatient left brachiocephalic fistula creation at [facility] on 06/13/2021.  She is here today for follow-up.  She reports that she had significant pain in her left shoulder and entire arm following the procedure.  Also reports some tenderness over the biceps area.  This is mostly resolved.  She does have some symptoms of coolness and numbness in her left hand.  No weakness.  She does have a history of prior nerve impingement and surgery on her left arm for this.  Current Outpatient Medications  Medication Sig Dispense Refill   acetaminophen (TYLENOL) 500 MG tablet Take 500 mg by mouth every 6 (six) hours as needed (for pain.).     allopurinol (ZYLOPRIM) 300 MG tablet Take 300 mg by mouth in the morning.  12   ALPRAZolam (XANAX) 0.25 MG tablet Take 0.25 mg by mouth 2 (two) times daily as needed for anxiety.     aspirin EC 81 MG tablet Take 81 mg by mouth in the morning. Swallow whole.     atenolol (TENORMIN) 50 MG tablet Take 50 mg by mouth 2 (two) times daily.     calcitRIOL (ROCALTROL) 0.25 MCG capsule Take 0.25 mcg by mouth in the morning.     Cholecalciferol (VITAMIN D3) 250 MCG (10000 UT) TABS Take 10,000 Units by mouth in the morning.     docusate sodium (COLACE) 50 MG capsule Take 50 mg by mouth at bedtime.     DULoxetine (CYMBALTA) 60 MG capsule Take 60 mg by mouth in the morning.     furosemide (LASIX) 40 MG tablet Take 40 mg by mouth See admin instructions. Take 2 tablets (80 mg) by mouth in the morning & take 1 tablet (40 mg) by mouth at night.     hydrALAZINE (APRESOLINE) 50  MG tablet Take 100 mg by mouth in the morning, at noon, and at bedtime.     isosorbide dinitrate (ISORDIL) 10 MG tablet TAKE 2 TABLETS BY MOUTH IN THE MORNING AND 1 TAB IN THE EVENING . APPOINTMENT REQUIRED FOR FUTURE REFILLS 90 tablet 0   loperamide (IMODIUM A-D) 2 MG tablet Take 0.5 tablets (1 mg total) by mouth daily with breakfast. (Patient taking differently: Take 1-4 mg by mouth 4 (four) times daily as needed for diarrhea or loose stools.) 30 tablet 0   NITROSTAT 0.4 MG SL tablet Place 1 tablet (0.4 mg total) under the tongue as needed. 25 tablet 3   rosuvastatin (CRESTOR) 10 MG tablet Take 1 tablet (10 mg total) by mouth daily.     sodium bicarbonate 650 MG tablet Take 650 mg by mouth 2 (two) times daily.     No current facility-administered medications for this visit.     PHYSICAL EXAM: Vitals:   07/12/21 1528  BP: 118/74  Pulse: 60  Resp: 16  Temp: 97.7 F (36.5 C)  TempSrc: Temporal  SpO2: 96%  Weight: 164 lb (74.4 kg)  Height: '5\' 2"'$  (1.575 m)    GENERAL: The patient is a well-nourished female, in no acute distress. The vital signs are documented above. Well-healed antecubital incision.  Excellent thrill throughout her cephalic vein in her upper  arm.  She does have a 2+ left radial pulse and her hand is warm.  She did undergo duplex today in our office which revealed good Garald Rhew maturation of her fistula with cephalic vein at the 6 mm throughout its course.  There is some tortuosity and it does run slightly deep.  MEDICAL ISSUES: Doing well overall.  Feel that the pain in her left shoulder and left arm following surgery was probably related to positioning at the time of surgery.  It is unusual to hear these complaints following the antecubital incision.  I am somewhat concerned regarding her hand symptoms.  She reports that this mainly is affecting her thumb second and third finger.  This could be representing mild steal syndrome.  She reports that this has not been  progressive.  She does have warm hand and normal radial pulse.  I explained that patients can have steal symptoms even with no evidence of obvious hand ischemia.  I will see her again in 1 month for continued follow-up of this.  She will notify should she develop any progressive symptoms in her hand prior to this.   Rosetta Posner, MD FACS [facility] [phone]  Note: Portions of this report may have been transcribed using voice recognition software.  Every effort has been made to ensure accuracy; however, inadvertent computerized transcription errors may still be present.

## 2021-07-20 DIAGNOSIS — E1122 Type 2 diabetes mellitus with diabetic chronic kidney disease: Secondary | ICD-10-CM | POA: Diagnosis not present

## 2021-07-20 DIAGNOSIS — N189 Chronic kidney disease, unspecified: Secondary | ICD-10-CM | POA: Diagnosis not present

## 2021-07-20 DIAGNOSIS — N170 Acute kidney failure with tubular necrosis: Secondary | ICD-10-CM | POA: Diagnosis not present

## 2021-07-20 DIAGNOSIS — R808 Other proteinuria: Secondary | ICD-10-CM | POA: Diagnosis not present

## 2021-07-20 DIAGNOSIS — I5032 Chronic diastolic (congestive) heart failure: Secondary | ICD-10-CM | POA: Diagnosis not present

## 2021-07-21 ENCOUNTER — Encounter: Payer: Self-pay | Admitting: Cardiology

## 2021-07-21 ENCOUNTER — Ambulatory Visit: Payer: Medicare Other | Admitting: Cardiology

## 2021-07-21 VITALS — BP 148/60 | HR 63 | Ht 62.0 in | Wt 172.6 lb

## 2021-07-21 DIAGNOSIS — E782 Mixed hyperlipidemia: Secondary | ICD-10-CM | POA: Diagnosis not present

## 2021-07-21 DIAGNOSIS — I10 Essential (primary) hypertension: Secondary | ICD-10-CM | POA: Diagnosis not present

## 2021-07-21 DIAGNOSIS — R0789 Other chest pain: Secondary | ICD-10-CM

## 2021-07-21 MED ORDER — NITROSTAT 0.4 MG SL SUBL: 0.4000 mg | SUBLINGUAL_TABLET | SUBLINGUAL | 3 refills | Status: DC | PRN

## 2021-07-21 MED ORDER — ISOSORBIDE DINITRATE 10 MG PO TABS: 10.0000 mg | ORAL_TABLET | Freq: Two times a day (BID) | ORAL | 3 refills | Status: DC

## 2021-07-21 NOTE — Progress Notes (Signed)
Clinical Summary Ms. Cordon is a 76 y.o.female seen today for follow up of the following medical problems.       1. History of chest pain Patient had left heart cath in 2003 (a [facility].  [facility]) and in 2016.  For abnormal stress test.  This both showed normal coronary arteries   07/2019 mild ischemia, low risk - no recent symptoms - previously has responved to SL NG, has done well since starting isordil.   - occasonal chest pains, about 2 times per month mainly with stress.     2. HTN - edema on norvasc - home bp's after meds 130s/70s   - home bp's 120s/60-80s - compliant with meds - some orthostatilc symptoms with standing at times.    3. Hyperlipidemia - labs followed by pcp - she is on crestor      4. CKD - Dr [name] follows - left sided fistula     4. DM2   5. OSA not on cpap   6. Orthostatic syncope - no recent episodes of syncope, chronic orthostatic dizzienss at times   7.LE edema - lasix '80mg'$  am, 23mpm  SH: husband diagnosed colon cancer, due for surgery this month June 2023   Past Medical History:  Diagnosis Date   Anemia    Anxiety    Cervical cancer ([facility]    Chronic bronchitis ([facility]    "get it q yr"   Chronic lower back pain    Depression    Febrile seizure ([facility]    "as a child"   Fibromyalgia    GERD (gastroesophageal reflux disease)    Gout    History of blood transfusion    "S/P tonsillectomy"   History of hiatal hernia    Hx of cardiovascular stress test April 2016   false positive Myoview   Hyperlipidemia    Hypertension    Migraine    "monthly" (05/26/2014)   Neuropathy    Osteoporosis    Parathyroid disease ([facility]    "my PHT levels run high; I can't take calcium"   Pneumonia "several times"   Renal cancer ([facility]    "right"-s/p nephrectomy   Renal insufficiency    "left kidney works at 40-60%" (05/26/2014)   Sleep apnea    "wore mask for 2 months; could not take it" (05/26/2014)   Type II diabetes mellitus (HCC)     Typhus fever    "as a child"     Allergies  Allergen Reactions   Demerol [Meperidine] Shortness Of Breath    Stop breathing   Lyrica [Pregabalin] Swelling    Stomach lesion   Vicodin [Hydrocodone-Acetaminophen] Shortness Of Breath    Stop breathing   Ace Inhibitors Cough   Aleve [Naproxen] Other (See Comments)    stomach lesion   Codeine Other (See Comments)    hallucination, confused   Contrast Media [Iodinated Contrast Media] Nausea Only and Other (See Comments)    MRI dye. Shaky, nausea   Fentanyl Itching   Morphine Nausea And Vomiting   Neurontin [Gabapentin] Nausea Only, Swelling and Other (See Comments)    Confusion.   Norvasc [Amlodipine] Swelling   Nsaids Other (See Comments)    Severe chronic kidney disease   Other Other (See Comments)    Anesthesia--prolonged sleep inertia (grogginess/disorientation/cognitive impairment)   Dilaudid [Hydromorphone Hcl] Itching, Nausea And Vomiting and Rash     Current Outpatient Medications  Medication Sig Dispense Refill   acetaminophen (TYLENOL) 500 MG tablet Take 500 mg  by mouth every 6 (six) hours as needed (for pain.).     allopurinol (ZYLOPRIM) 300 MG tablet Take 300 mg by mouth in the morning.  12   ALPRAZolam (XANAX) 0.25 MG tablet Take 0.25 mg by mouth 2 (two) times daily as needed for anxiety.     aspirin EC 81 MG tablet Take 81 mg by mouth in the morning. Swallow whole.     atenolol (TENORMIN) 50 MG tablet Take 50 mg by mouth 2 (two) times daily.     calcitRIOL (ROCALTROL) 0.25 MCG capsule Take 0.25 mcg by mouth in the morning.     Cholecalciferol (VITAMIN D3) 250 MCG (10000 UT) TABS Take 10,000 Units by mouth in the morning.     docusate sodium (COLACE) 50 MG capsule Take 50 mg by mouth at bedtime.     DULoxetine (CYMBALTA) 60 MG capsule Take 60 mg by mouth in the morning.     furosemide (LASIX) 40 MG tablet Take 40 mg by mouth See admin instructions. Take 2 tablets (80 mg) by mouth in the morning & take 1 tablet (40  mg) by mouth at night.     hydrALAZINE (APRESOLINE) 50 MG tablet Take 100 mg by mouth in the morning, at noon, and at bedtime.     isosorbide dinitrate (ISORDIL) 10 MG tablet TAKE 2 TABLETS BY MOUTH IN THE MORNING AND 1 TAB IN THE EVENING . APPOINTMENT REQUIRED FOR FUTURE REFILLS 90 tablet 0   NITROSTAT 0.4 MG SL tablet Place 1 tablet (0.4 mg total) under the tongue as needed. 25 tablet 3   rosuvastatin (CRESTOR) 10 MG tablet Take 1 tablet (10 mg total) by mouth daily.     sodium bicarbonate 650 MG tablet Take 650 mg by mouth 2 (two) times daily.     No current facility-administered medications for this visit.     Past Surgical History:  Procedure Laterality Date   ABDOMINAL HERNIA REPAIR  02/2013   "in [place]"   [place]   "partial"   APPENDECTOMY     AV FISTULA PLACEMENT Left 06/13/2021   Procedure: LEFT ARM ARTERIOVENOUS FISTULA CREATION;  Surgeon: Rosetta Posner, MD;  Location: [facility] ORS;  Service: Vascular;  Laterality: Left;   BACK SURGERY     BILATERAL SALPINGOOPHORECTOMY Bilateral ~ [place]  2003, April 2016   normal coronaries   CATARACT EXTRACTION W/ INTRAOCULAR LENS  IMPLANT, BILATERAL  2015   COLONOSCOPY N/A 11/25/2014   Procedure: COLONOSCOPY;  Surgeon: Rogene Houston, MD;  Location: [facility] END[facility] SU[facility];  Service: Endoscopy;  Laterality: N/A;   CYSTOSCOPY W/ [place] MANIPULATION  1988   DIAGNOSTIC LAPAROSCOPY  1975   DILATION AND CURETTAGE OF UTERUS     ESOPHAGOGASTRODUODENOSCOPY N/A 11/25/2014   Procedure: ESOPHAGOGASTRODUODENOSCOPY (EGD);  Surgeon: Rogene Houston, MD;  Location: [facility];  Service: Endoscopy;  Laterality: N/A;  9:30 - moved to 11:25 - [name] to notify pt   ESOPHAGOGASTRODUODENOSCOPY (EGD) [place]     GASTRIC BYPASS  2012   HERNIA REPAIR     2015   [place] ARTHROSCOPY Left 2000's   LAPAROSCOPIC CHOLECYSTECTOMY  1990's    LEFT HEART CATHETERIZATION WITH CORONARY ANGIOGRAM N/A 05/27/2014   Procedure: LEFT HEART CATHETERIZATION WITH CORONARY ANGIOGRAM;  Surgeon: Lorretta Harp, MD;  Location[facility] CATH LAB;  Service: Cardiovascular;  Laterality: N/A;  [place] SURGERY  2001   "herniated discs"   [place]   "drilled into gum and put tooth implants uppers"   NEPHRECTOMY Right 2002   "cancer"   TMJ ARTHROPLASTY  1988   TONSILLECTOMY  1976   UPPER GI ENDOSCOPY       Allergies  Allergen Reactions   Demerol [Meperidine] Shortness Of Breath    Stop breathing   Lyrica [Pregabalin] Swelling    Stomach lesion   Vicodin [Hydrocodone-Acetaminophen] Shortness Of Breath    Stop breathing   Ace Inhibitors Cough   Aleve [Naproxen] Other (See Comments)    stomach lesion   Codeine Other (See Comments)    hallucination, confused   Contrast Media [Iodinated Contrast Media] Nausea Only and Other (See Comments)    MRI dye. Shaky, nausea   Fentanyl Itching   Morphine Nausea And Vomiting   Neurontin [Gabapentin] Nausea Only, Swelling and Other (See Comments)    Confusion.   Norvasc [Amlodipine] Swelling   Nsaids Other (See Comments)    Severe chronic kidney disease   Other Other (See Comments)    Anesthesia--prolonged sleep inertia (grogginess/disorientation/cognitive impairment)   Dilaudid [Hydromorphone Hcl] Itching, Nausea And Vomiting and Rash      Family History  Problem Relation Age of Onset   Emphysema Father    Heart disease Father    Clotting disorder Mother    Diabetes Mother    Kidney disease Mother    Allergies Other        whole family per pt   Lung cancer Maternal Uncle    Alcohol abuse Maternal Uncle      Social History Ms. Kochel reports that she has never smoked. She has never used smokeless tobacco. Ms. Sarratt reports no history of alcohol use.   Review of Systems CONSTITUTIONAL: No weight loss, fever, chills, weakness or fatigue.  HEENT: Eyes: No visual loss,  blurred vision, double vision or yellow sclerae.No hearing loss, sneezing, congestion, runny nose or sore throat.  SKIN: No rash or itching.  CARDIOVASCULAR: per hpi RESPIRATORY: No shortness of breath, cough or sputum.  GASTROINTESTINAL: No anorexia, nausea, vomiting or diarrhea. No abdominal pain or blood.  GENITOURINARY: No burning on urination, no polyuria NEUROLOGICAL: per hpi MUSCULOSKELETAL: No muscle, back pain, joint pain or stiffness.  LYMPHATICS: No enlarged nodes. No history of splenectomy.  PSYCHIATRIC: No history of depression or anxiety.  ENDOCRINOLOGIC: No reports of sweating, cold or heat intolerance. No polyuria or polydipsia.  Mar[place] Kitchen   Physical Examination There were no vitals filed for this visit. Filed Weights   07/21/21 1427  Weight: 172 lb 9.6 oz (78.3 kg)    Gen: resting comfortably, no acute distress HEENT: no scleral icterus, pupils equal round and reactive, no palptable cervical adenopathy,  CV: RRR, no m/r/g no jvd Resp: Clear to auscultation bilaterally GI: abdomen is soft, non-tender, non-distended, normal bowel sounds, no hepatosplenomegaly MSK: extremities are warm, no edema.  Skin: warm, no rash Neuro:  no focal deficits Psych: appropriate affect   Diagnostic Studies  Nuclear stress test 08/05/2019 There was no ST segment deviation noted during stress. Findings consistent with mild apical ischemia. This is a low risk study. The left ventricular ejection fraction is hyperdynamic (>65%).   Echo 08/05/19 . Left ventricular ejection fraction, by estimation, is 60 to 65%. The  left ventricle has normal function. The left ventricle has no regional  wall motion abnormalities. There is mild left ventricular hypertrophy.  Left ventricular diastolic parameters  are consistent with Grade I diastolic dysfunction (impaired relaxation).   2. Right ventricular systolic function is normal. The right ventricular  size is normal. There is mildly elevated  pulmonary artery systolic  pressure.   3. Left atrial size was severely dilated.   4. Right atrial size was mildly dilated.   5. The mitral valve is normal in structure. Mild mitral valve  regurgitation. No evidence of mitral stenosis.   6. The aortic valve is tricuspid. Aortic valve regurgitation is not  visualized. No aortic stenosis is present.   7. The inferior vena cava is normal in size with greater than 50%  respiratory variability, suggesting right atrial pressure of 3 mmHg.   8. Left to right atrial shunting noted by color Doppler, probable PFO.      11/2019 renal artery [place] Summary:  Largest Aortic Diameter: 2.2 cm     Renal:     Right: Right kidney removed 2002.  Left:  Normal size of left kidney. Abnormal left Resisitve Index.         Normal cortical thickness of the left kidney. No evidence of         left renal artery stenosis. LRV flow present.  Mesenteric:  Normal Celiac artery and Superior Mesenteric artery findings.        Assessment and Plan  1. Chest pain - long history of symptoms with overall benign ischemic testing, most recently 07/2019 -no significant recent symptmos, continue to monitor - with orthostatic symptoms lower isordil to '10mg'$  bid   2. HTN -elevated here but home numbers at goal - chronic orthostatic symptoms, careful management of bp - continue current med  3. Hyperlipidemia - request labs from pcp      Arnoldo Lenis, M.D.

## 2021-07-21 NOTE — Patient Instructions (Signed)
Medication Instructions:  DECREASE Isordil to 10 mg twice a day  Labwork: None   Testing/Procedures: None  Follow-Up: 6 months  Any Other Special Instructions Will Be Listed Below (If Applicable).  If you need a refill on your cardiac medications before your next appointment, please call your pharmacy.

## 2021-07-27 DIAGNOSIS — N170 Acute kidney failure with tubular necrosis: Secondary | ICD-10-CM | POA: Diagnosis not present

## 2021-07-27 DIAGNOSIS — I5032 Chronic diastolic (congestive) heart failure: Secondary | ICD-10-CM | POA: Diagnosis not present

## 2021-07-27 DIAGNOSIS — I129 Hypertensive chronic kidney disease with stage 1 through stage 4 chronic kidney disease, or unspecified chronic kidney disease: Secondary | ICD-10-CM | POA: Diagnosis not present

## 2021-07-27 DIAGNOSIS — I770 Arteriovenous fistula, acquired: Secondary | ICD-10-CM | POA: Diagnosis not present

## 2021-07-27 DIAGNOSIS — N19 Unspecified kidney failure: Secondary | ICD-10-CM | POA: Diagnosis not present

## 2021-07-27 DIAGNOSIS — R808 Other proteinuria: Secondary | ICD-10-CM | POA: Diagnosis not present

## 2021-07-27 DIAGNOSIS — N189 Chronic kidney disease, unspecified: Secondary | ICD-10-CM | POA: Diagnosis not present

## 2021-07-27 DIAGNOSIS — E1122 Type 2 diabetes mellitus with diabetic chronic kidney disease: Secondary | ICD-10-CM | POA: Diagnosis not present

## 2021-07-27 DIAGNOSIS — E211 Secondary hyperparathyroidism, not elsewhere classified: Secondary | ICD-10-CM | POA: Diagnosis not present

## 2021-08-16 ENCOUNTER — Ambulatory Visit: Payer: Medicare Other | Admitting: Vascular Surgery

## 2021-08-16 ENCOUNTER — Encounter: Payer: Self-pay | Admitting: Vascular Surgery

## 2021-08-16 VITALS — BP 125/71 | HR 57 | Temp 97.5°F | Ht 62.0 in | Wt 163.8 lb

## 2021-08-16 DIAGNOSIS — N184 Chronic kidney disease, stage 4 (severe): Secondary | ICD-10-CM

## 2021-08-16 DIAGNOSIS — N185 Chronic kidney disease, stage 5: Secondary | ICD-10-CM | POA: Diagnosis not present

## 2021-08-16 DIAGNOSIS — E785 Hyperlipidemia, unspecified: Secondary | ICD-10-CM | POA: Diagnosis not present

## 2021-08-16 DIAGNOSIS — Z79899 Other long term (current) drug therapy: Secondary | ICD-10-CM | POA: Diagnosis not present

## 2021-08-16 DIAGNOSIS — E1129 Type 2 diabetes mellitus with other diabetic kidney complication: Secondary | ICD-10-CM | POA: Diagnosis not present

## 2021-08-16 NOTE — Progress Notes (Signed)
Vascular and Vein Specialist of [place]  Patient name: Margaret Huerta MRN: [id number] DOB: Jun 28, 1945 Sex: female  REASON FOR VISIT: Follow-up left brachiocephalic fistula creation on 06/13/2021  HPI: Margaret Huerta is a 76 y.o. female here today for follow-up.  She had initial left brachiocephalic AV fistula creation by myself on 06/13/2021.  She was seen back in our office on 07/12/2021.  She was reporting what seems like mild steal symptoms in her left hand and is seen today for continued follow-up.  She reports that she has had resolution of this.  She is not having any pain, numbness or weakness in her left hand.  She reports her GFR is 11% predicted.  She is not having uremic symptoms.  Current Outpatient Medications  Medication Sig Dispense Refill   acetaminophen (TYLENOL) 500 MG tablet Take 500 mg by mouth every 6 (six) hours as needed (for pain.).     allopurinol (ZYLOPRIM) 300 MG tablet Take 300 mg by mouth in the morning.  12   ALPRAZolam (XANAX) 0.25 MG tablet Take 0.25 mg by mouth 2 (two) times daily as needed for anxiety.     aspirin EC 81 MG tablet Take 81 mg by mouth in the morning. Swallow whole.     atenolol (TENORMIN) 50 MG tablet Take 50 mg by mouth 2 (two) times daily.     calcitRIOL (ROCALTROL) 0.25 MCG capsule Take 0.25 mcg by mouth in the morning.     Cholecalciferol (VITAMIN D3) 250 MCG (10000 UT) TABS Take 10,000 Units by mouth in the morning.     docusate sodium (COLACE) 50 MG capsule Take 50 mg by mouth at bedtime.     DULoxetine (CYMBALTA) 60 MG capsule Take 60 mg by mouth in the morning.     furosemide (LASIX) 40 MG tablet Take 40 mg by mouth See admin instructions. Take 2 tablets (80 mg) by mouth in the morning & take 1 tablet (40 mg) by mouth at night.     hydrALAZINE (APRESOLINE) 50 MG tablet Take 100 mg by mouth in the morning, at noon, and at bedtime.     isosorbide dinitrate (ISORDIL) 10 MG tablet Take 1 tablet (10 mg total) by  mouth 2 (two) times daily. 180 tablet 3   NITROSTAT 0.4 MG SL tablet Place 1 tablet (0.4 mg total) under the tongue as needed. 25 tablet 3   rosuvastatin (CRESTOR) 10 MG tablet Take 1 tablet (10 mg total) by mouth daily.     sodium bicarbonate 650 MG tablet Take 650 mg by mouth 2 (two) times daily.     No current facility-administered medications for this visit.     PHYSICAL EXAM: Vitals:   08/16/21 1056  BP: 125/71  Pulse: (!) 57  Temp: (!) 97.5 F (36.4 C)  TempSrc: Temporal  SpO2: 94%  Weight: 163 lb 12.8 oz (74.3 kg)  Height: '5\' 2"'$  (1.575 m)    GENERAL: The patient is a well-nourished female, in no acute distress. The vital signs are documented above. Palpable left radial pulse.  Left hand warm with good grip strength.  She does have a excellent thrill in her left upper arm fistula.  She has good size maturation.  The vein runs relatively close to the surface in the distal upper arm.  It does run deeper to the surface of the skin in the proximal upper arm.  I imaged her fistula with SonoSite ultrasound.  This does show good size with minimal branching.  MEDICAL ISSUES:  No evidence of steal syndrome left hand.  I had a long discussion with the patient regarding her left brachiocephalic fistula.  It does run slightly deep to the surface of the skin in the proximalmost portion.  She does also have some tortuosity.  I feel that she is at moderate chance for successful use of her fistula.  I would wait a total of 3 months after placement and then begin using this.  I did explain that if she is not having successful use of her upper arm fistula with technicians at the dialysis center, we would potentially place a catheter and revise her left upper arm fistula with attempted superficial mobilization.  I would not recommend this at this time since do feel that she has a chance for adequate use with no further surgery.  We are available if revision is needed.   Rosetta Posner, MD  FACS [facility] [phone]  Note: Portions of this report may have been transcribed using voice recognition software.  Every effort has been made to ensure accuracy; however, inadvertent computerized transcription errors may still be present.

## 2021-08-22 DIAGNOSIS — E1122 Type 2 diabetes mellitus with diabetic chronic kidney disease: Secondary | ICD-10-CM | POA: Diagnosis not present

## 2021-08-22 DIAGNOSIS — R7309 Other abnormal glucose: Secondary | ICD-10-CM | POA: Diagnosis not present

## 2021-08-22 DIAGNOSIS — E785 Hyperlipidemia, unspecified: Secondary | ICD-10-CM | POA: Diagnosis not present

## 2021-09-05 DIAGNOSIS — I129 Hypertensive chronic kidney disease with stage 1 through stage 4 chronic kidney disease, or unspecified chronic kidney disease: Secondary | ICD-10-CM | POA: Diagnosis not present

## 2021-09-05 DIAGNOSIS — E1122 Type 2 diabetes mellitus with diabetic chronic kidney disease: Secondary | ICD-10-CM | POA: Diagnosis not present

## 2021-09-05 DIAGNOSIS — N170 Acute kidney failure with tubular necrosis: Secondary | ICD-10-CM | POA: Diagnosis not present

## 2021-09-05 DIAGNOSIS — N189 Chronic kidney disease, unspecified: Secondary | ICD-10-CM | POA: Diagnosis not present

## 2021-09-11 ENCOUNTER — Other Ambulatory Visit: Payer: Self-pay | Admitting: *Deleted

## 2021-09-11 NOTE — Patient Outreach (Signed)
  Care Management   Outreach Note  09/11/2021  Name: Margaret Huerta MRN: [id number] DOB: Aug 14, 1945  Reason for Referral: Care Coordination - Assessment of Needs.   An unsuccessful telephone outreach was attempted today. The patient was referred to the case management team for assistance with care management and care coordination. A HIPAA compliant message was left on voicemail for patient, providing contact information for CSW, encouraging patient to return the call at her earliest convenience.   Follow Up Plan:  [facility] will reschedule initial telephone outreach call for patient with CSW.   Nat Christen, BSW, MSW, LCSW  Licensed Education officer, [facility]  Mailing [address] [place], [place] [address] Physical Address-[address] [place], [place] [address] Toll Free Main # [phone] Fax # [phone] Cell # [phone] [email or web address]

## 2021-09-13 ENCOUNTER — Ambulatory Visit (INDEPENDENT_AMBULATORY_CARE_PROVIDER_SITE_OTHER): Payer: Medicare Other | Admitting: Vascular Surgery

## 2021-09-13 ENCOUNTER — Other Ambulatory Visit: Payer: Medicare Other

## 2021-09-13 ENCOUNTER — Encounter: Payer: Self-pay | Admitting: Vascular Surgery

## 2021-09-13 VITALS — BP 129/72 | HR 67 | Temp 97.9°F | Resp 16 | Ht 62.0 in | Wt 164.2 lb

## 2021-09-13 DIAGNOSIS — R808 Other proteinuria: Secondary | ICD-10-CM | POA: Diagnosis not present

## 2021-09-13 DIAGNOSIS — E1122 Type 2 diabetes mellitus with diabetic chronic kidney disease: Secondary | ICD-10-CM | POA: Diagnosis not present

## 2021-09-13 DIAGNOSIS — I5032 Chronic diastolic (congestive) heart failure: Secondary | ICD-10-CM | POA: Diagnosis not present

## 2021-09-13 DIAGNOSIS — I129 Hypertensive chronic kidney disease with stage 1 through stage 4 chronic kidney disease, or unspecified chronic kidney disease: Secondary | ICD-10-CM | POA: Diagnosis not present

## 2021-09-13 DIAGNOSIS — T82590A Other mechanical complication of surgically created arteriovenous fistula, initial encounter: Secondary | ICD-10-CM | POA: Diagnosis not present

## 2021-09-13 DIAGNOSIS — N184 Chronic kidney disease, stage 4 (severe): Secondary | ICD-10-CM

## 2021-09-13 DIAGNOSIS — N185 Chronic kidney disease, stage 5: Secondary | ICD-10-CM | POA: Diagnosis not present

## 2021-09-13 DIAGNOSIS — E211 Secondary hyperparathyroidism, not elsewhere classified: Secondary | ICD-10-CM | POA: Diagnosis not present

## 2021-09-13 DIAGNOSIS — N19 Unspecified kidney failure: Secondary | ICD-10-CM | POA: Diagnosis not present

## 2021-09-13 NOTE — H&P (View-Only) (Signed)
[facility]ist of [place]  Patient name: Margaret Huerta MRN: [id number] DOB: 1945/12/07 Sex: female  REASON FOR VISIT: Evaluate for left hand steal syndrome  HPI: Margaret Huerta is a 76 y.o. female here today for evaluation of potential left hand steal syndrome.  She was seen in the office with Dr. [name] today.  She has had progression of her renal failure and is now having uremic symptoms.  He wishes to initiate hemodialysis.  She complained of discomfort in her left hand and is seen today for further evaluation of this.  Immediately postop, she had some mild steal symptoms but most my last visit with her this had resolved.  She reports that over the past 1 to 2 weeks that she is having worsening symptoms.  She does report some weakness in her left hand and reports that she is on occasion dropped items from her left hand.  She also reports an achy symptom which is improved with warm water or even wearing a glove.  She does have some tenderness in her upper arm which is not consistent with steal.  Current Outpatient Medications  Medication Sig Dispense Refill   acetaminophen (TYLENOL) 500 MG tablet Take 500 mg by mouth every 6 (six) hours as needed (for pain.).     allopurinol (ZYLOPRIM) 300 MG tablet Take 300 mg by mouth in the morning.  12   ALPRAZolam (XANAX) 0.25 MG tablet Take 0.25 mg by mouth 2 (two) times daily as needed for anxiety.     aspirin EC 81 MG tablet Take 81 mg by mouth in the morning. Swallow whole.     atenolol (TENORMIN) 50 MG tablet Take 50 mg by mouth 2 (two) times daily.     calcitRIOL (ROCALTROL) 0.25 MCG capsule Take 0.25 mcg by mouth in the morning.     Cholecalciferol (VITAMIN D3) 250 MCG (10000 UT) TABS Take 10,000 Units by mouth in the morning.     docusate sodium (COLACE) 50 MG capsule Take 50 mg by mouth at bedtime.     DULoxetine (CYMBALTA) 60 MG capsule Take 60 mg by mouth in the morning.     furosemide (LASIX)  40 MG tablet Take 40 mg by mouth See admin instructions. Take 2 tablets (80 mg) by mouth in the morning & take 1 tablet (40 mg) by mouth at night.     hydrALAZINE (APRESOLINE) 50 MG tablet Take 100 mg by mouth in the morning, at noon, and at bedtime.     isosorbide dinitrate (ISORDIL) 10 MG tablet Take 1 tablet (10 mg total) by mouth 2 (two) times daily. 180 tablet 3   NITROSTAT 0.4 MG SL tablet Place 1 tablet (0.4 mg total) under the tongue as needed. 25 tablet 3   rosuvastatin (CRESTOR) 10 MG tablet Take 1 tablet (10 mg total) by mouth daily.     sodium bicarbonate 650 MG tablet Take 650 mg by mouth 2 (two) times daily.     No current facility-administered medications for this visit.     PHYSICAL EXAM: Vitals:   09/13/21 1458  BP: 129/72  Pulse: 67  Resp: 16  Temp: 97.9 F (36.6 C)  TempSrc: Temporal  SpO2: 97%  Weight: 164 lb 3.2 oz (74.5 kg)  Height: '5\' 2"'$  (1.575 m)    GENERAL: The patient is a well-nourished female, in no acute distress. The vital signs are documented above. She has an easily palpable left radial pulse.  She has an excellent thrill in  her upper arm fistula.  She has moderate size development of her upper arm cephalic vein with some tortuosity.  She does have some slight augmentation of her radial signal with pencil probe Doppler with compression of her cephalic vein fistula.  MEDICAL ISSUES: I had a very long discussion with the patient.  I feel that in all likelihood this is steal syndrome symptoms.  I feel that her best treatment would be ligation of her fistula.  She is ambidextrous.  I have recommended placement of a tunneled hemodialysis catheter at the same time of fistula ligation.  She would then have hemodialysis via her catheter.  I would see her back in the office to assure resolution of left hand symptoms and then schedule right AV fistula.  I did image her right arm veins and she does have moderate size cephalic vein at the antecubital space on the  right and patent cephalic vein throughout her right upper arm.  I did explain the risk of steal with her right arm as well.  We have scheduled her for ligation of left AV fistula and catheter placement on 09/19/2021 at [facility]   Rosetta Posner, MD FACS [facility] Tel [phone]  Note: Portions of this report may have been transcribed using voice recognition software.  Every effort has been made to ensure accuracy; however, inadvertent computerized transcription errors may still be present.

## 2021-09-13 NOTE — Progress Notes (Signed)
[facility] Specialist of [place]  Patient name: Margaret Huerta MRN: [id number] DOB: 03/18/1945 Sex: female  REASON FOR VISIT: Evaluate for left hand steal syndrome  HPI: Margaret Huerta is a 76 y.o. female here today for evaluation of potential left hand steal syndrome.  She was seen in the office with Dr. [name] today.  She has had progression of her renal failure and is now having uremic symptoms.  He wishes to initiate hemodialysis.  She complained of discomfort in her left hand and is seen today for further evaluation of this.  Immediately postop, she had some mild steal symptoms but most my last visit with her this had resolved.  She reports that over the past 1 to 2 weeks that she is having worsening symptoms.  She does report some weakness in her left hand and reports that she is on occasion dropped items from her left hand.  She also reports an achy symptom which is improved with warm water or even wearing a glove.  She does have some tenderness in her upper arm which is not consistent with steal.  Current Outpatient Medications  Medication Sig Dispense Refill   acetaminophen (TYLENOL) 500 MG tablet Take 500 mg by mouth every 6 (six) hours as needed (for pain.).     allopurinol (ZYLOPRIM) 300 MG tablet Take 300 mg by mouth in the morning.  12   ALPRAZolam (XANAX) 0.25 MG tablet Take 0.25 mg by mouth 2 (two) times daily as needed for anxiety.     aspirin EC 81 MG tablet Take 81 mg by mouth in the morning. Swallow whole.     atenolol (TENORMIN) 50 MG tablet Take 50 mg by mouth 2 (two) times daily.     calcitRIOL (ROCALTROL) 0.25 MCG capsule Take 0.25 mcg by mouth in the morning.     Cholecalciferol (VITAMIN D3) 250 MCG (10000 UT) TABS Take 10,000 Units by mouth in the morning.     docusate sodium (COLACE) 50 MG capsule Take 50 mg by mouth at bedtime.     DULoxetine (CYMBALTA) 60 MG capsule Take 60 mg by mouth in the morning.     furosemide (LASIX)  40 MG tablet Take 40 mg by mouth See admin instructions. Take 2 tablets (80 mg) by mouth in the morning & take 1 tablet (40 mg) by mouth at night.     hydrALAZINE (APRESOLINE) 50 MG tablet Take 100 mg by mouth in the morning, at noon, and at bedtime.     isosorbide dinitrate (ISORDIL) 10 MG tablet Take 1 tablet (10 mg total) by mouth 2 (two) times daily. 180 tablet 3   NITROSTAT 0.4 MG SL tablet Place 1 tablet (0.4 mg total) under the tongue as needed. 25 tablet 3   rosuvastatin (CRESTOR) 10 MG tablet Take 1 tablet (10 mg total) by mouth daily.     sodium bicarbonate 650 MG tablet Take 650 mg by mouth 2 (two) times daily.     No current facility-administered medications for this visit.     PHYSICAL EXAM: Vitals:   09/13/21 1458  BP: 129/72  Pulse: 67  Resp: 16  Temp: 97.9 F (36.6 C)  TempSrc: Temporal  SpO2: 97%  Weight: 164 lb 3.2 oz (74.5 kg)  Height: '5\' 2"'$  (1.575 m)    GENERAL: The patient is a well-nourished female, in no acute distress. The vital signs are documented above. She has an easily palpable left radial pulse.  She has an excellent thrill in  her upper arm fistula.  She has moderate size development of her upper arm cephalic vein with some tortuosity.  She does have some slight augmentation of her radial signal with pencil probe Doppler with compression of her cephalic vein fistula.  MEDICAL ISSUES: I had a very long discussion with the patient.  I feel that in all likelihood this is steal syndrome symptoms.  I feel that her best treatment would be ligation of her fistula.  She is ambidextrous.  I have recommended placement of a tunneled hemodialysis catheter at the same time of fistula ligation.  She would then have hemodialysis via her catheter.  I would see her back in the office to assure resolution of left hand symptoms and then schedule right AV fistula.  I did image her right arm veins and she does have moderate size cephalic vein at the antecubital space on the  right and patent cephalic vein throughout her right upper arm.  I did explain the risk of steal with her right arm as well.  We have scheduled her for ligation of left AV fistula and catheter placement on 09/19/2021 at [facility]   Rosetta Posner, MD FACS [facility] Tel [phone]  Note: Portions of this report may have been transcribed using voice recognition software.  Every effort has been made to ensure accuracy; however, inadvertent computerized transcription errors may still be present.

## 2021-09-14 DIAGNOSIS — M542 Cervicalgia: Secondary | ICD-10-CM | POA: Diagnosis not present

## 2021-09-15 ENCOUNTER — Ambulatory Visit: Payer: Self-pay | Admitting: *Deleted

## 2021-09-18 ENCOUNTER — Encounter (HOSPITAL_COMMUNITY): Payer: Self-pay | Admitting: Vascular Surgery

## 2021-09-18 ENCOUNTER — Encounter: Payer: Self-pay | Admitting: *Deleted

## 2021-09-18 ENCOUNTER — Encounter (HOSPITAL_COMMUNITY)
Admission: RE | Admit: 2021-09-18 | Discharge: 2021-09-18 | Disposition: A | Discharge status: Home / Self Care | Payer: Medicare Other | Source: Ambulatory Visit | Attending: Vascular Surgery | Admitting: Vascular Surgery

## 2021-09-18 ENCOUNTER — Other Ambulatory Visit: Payer: Self-pay

## 2021-09-18 DIAGNOSIS — N184 Chronic kidney disease, stage 4 (severe): Secondary | ICD-10-CM

## 2021-09-18 NOTE — Patient Outreach (Signed)
  Care Coordination   Initial Visit Note   09/18/2021  Name: Margaret Huerta MRN: [id number] DOB: 1945/05/07  Margaret Huerta is a 76 y.o. year old female who sees [name], MD for primary care. I spoke with  Margaret Huerta by phone today.  What matters to the patients health and wellness today?  No Intervention Identified. CSW collaboration with Primary Care Provider, Dr. [name] to report "a lot of pain in left side, from fingertips to fistula".  Fistula recently placed for treatment of Hemodialysis.  Patient also reported pinched nerve in neck.    Goals Addressed   None     SDOH assessments and interventions completed:  Yes  SDOH Interventions Today    Flowsheet Row Most Recent Value  SDOH Interventions   Food Insecurity Interventions Intervention Not Indicated  Financial Strain Interventions Intervention Not Indicated  Housing Interventions Intervention Not Indicated  Physical Activity Interventions Patient Refused  Stress Interventions Intervention Not Indicated  Social Connections Interventions Intervention Not Indicated  Transportation Interventions Intervention Not Indicated        Care Coordination Interventions Activated:  Yes   Care Coordination Interventions:  Yes, provided.   Follow up plan: No further intervention required.   Encounter Outcome:  Pt. Visit Completed.   Nat Christen, BSW, MSW, LCSW  Licensed Education officer, [facility]  Mailing [place] [address] [place], [place] [address] Physical Address-[address] [place], [place] [address] Toll Free Main # [phone] Fax # [phone] Cell # [phone] [email or web address]

## 2021-09-18 NOTE — Patient Instructions (Signed)
Visit Information  Thank you for taking time to visit with me today. Please don't hesitate to contact me if I can be of assistance to you.   Please call the care guide team at [phone] if you need to cancel or reschedule your appointment.   If you are experiencing a Mental Health or Behavioral Health Crisis or need someone to talk to, please call the Suicide and Crisis Lifeline: 988 call the [place] National Suicide Prevention Lifeline: 1-800-273-8255 or TTY: 1-800-799-4 TTY (1-800-799-4889) to talk to a trained counselor call 1-800-273-TALK (toll free, 24 hour hotline) go to [facility] [address], [place] ([phone]) call the [place] Crisis Line: 800-939-9988 call 911  Patient verbalizes understanding of instructions and care plan provided today and agrees to view in MyChart. Active MyChart status and patient understanding of how to access instructions and care plan via MyChart confirmed with patient.     No further follow up required.  Margaret Huerta, BSW, MSW, LCSW  Licensed Clinical Social Worker  [facility] [place] [facility]  Mailing Address-[address], [place], [place] [address] Physical Address-[address], [place], [place] [address] Toll Free Main # [phone] Fax # [phone] Cell # [phone] [email or web address]            

## 2021-09-19 ENCOUNTER — Ambulatory Visit (HOSPITAL_COMMUNITY): Payer: Medicare Other

## 2021-09-19 ENCOUNTER — Ambulatory Visit (HOSPITAL_COMMUNITY)
Admission: RE | Admit: 2021-09-19 | Discharge: 2021-09-19 | Disposition: A | Discharge status: Home / Self Care | Payer: Medicare Other | Source: Home / Self Care | Attending: Vascular Surgery | Admitting: Vascular Surgery

## 2021-09-19 ENCOUNTER — Other Ambulatory Visit: Payer: Self-pay

## 2021-09-19 ENCOUNTER — Encounter (HOSPITAL_COMMUNITY): Payer: Self-pay | Admitting: Vascular Surgery

## 2021-09-19 ENCOUNTER — Ambulatory Visit (HOSPITAL_BASED_OUTPATIENT_CLINIC_OR_DEPARTMENT_OTHER): Payer: Medicare Other | Admitting: Anesthesiology

## 2021-09-19 ENCOUNTER — Ambulatory Visit (HOSPITAL_COMMUNITY): Payer: Medicare Other | Admitting: Anesthesiology

## 2021-09-19 ENCOUNTER — Telehealth: Payer: Self-pay | Admitting: Vascular Surgery

## 2021-09-19 ENCOUNTER — Ambulatory Visit (HOSPITAL_COMMUNITY)
Admission: RE | Admit: 2021-09-19 | Discharge: 2021-09-19 | Disposition: A | Discharge status: Home / Self Care | Payer: Medicare Other | Attending: Vascular Surgery | Admitting: Vascular Surgery

## 2021-09-19 ENCOUNTER — Encounter (HOSPITAL_COMMUNITY)
Admission: RE | Disposition: A | Discharge status: Home / Self Care | Payer: Self-pay | Source: Home / Self Care | Attending: Vascular Surgery

## 2021-09-19 DIAGNOSIS — I129 Hypertensive chronic kidney disease with stage 1 through stage 4 chronic kidney disease, or unspecified chronic kidney disease: Secondary | ICD-10-CM

## 2021-09-19 DIAGNOSIS — E1122 Type 2 diabetes mellitus with diabetic chronic kidney disease: Secondary | ICD-10-CM

## 2021-09-19 DIAGNOSIS — Z85528 Personal history of other malignant neoplasm of kidney: Secondary | ICD-10-CM | POA: Diagnosis not present

## 2021-09-19 DIAGNOSIS — D119 Benign neoplasm of major salivary gland, unspecified: Secondary | ICD-10-CM | POA: Diagnosis not present

## 2021-09-19 DIAGNOSIS — T82898A Other specified complication of vascular prosthetic devices, implants and grafts, initial encounter: Secondary | ICD-10-CM

## 2021-09-19 DIAGNOSIS — M797 Fibromyalgia: Secondary | ICD-10-CM | POA: Diagnosis not present

## 2021-09-19 DIAGNOSIS — F32A Depression, unspecified: Secondary | ICD-10-CM | POA: Diagnosis not present

## 2021-09-19 DIAGNOSIS — N189 Chronic kidney disease, unspecified: Secondary | ICD-10-CM

## 2021-09-19 DIAGNOSIS — N184 Chronic kidney disease, stage 4 (severe): Secondary | ICD-10-CM

## 2021-09-19 DIAGNOSIS — I10 Essential (primary) hypertension: Secondary | ICD-10-CM | POA: Diagnosis not present

## 2021-09-19 DIAGNOSIS — Z452 Encounter for adjustment and management of vascular access device: Secondary | ICD-10-CM | POA: Diagnosis not present

## 2021-09-19 DIAGNOSIS — Z905 Acquired absence of kidney: Secondary | ICD-10-CM | POA: Insufficient documentation

## 2021-09-19 DIAGNOSIS — N186 End stage renal disease: Secondary | ICD-10-CM | POA: Diagnosis not present

## 2021-09-19 DIAGNOSIS — F419 Anxiety disorder, unspecified: Secondary | ICD-10-CM | POA: Diagnosis not present

## 2021-09-19 DIAGNOSIS — N185 Chronic kidney disease, stage 5: Secondary | ICD-10-CM | POA: Diagnosis not present

## 2021-09-19 DIAGNOSIS — I120 Hypertensive chronic kidney disease with stage 5 chronic kidney disease or end stage renal disease: Secondary | ICD-10-CM | POA: Diagnosis not present

## 2021-09-19 DIAGNOSIS — Y832 Surgical operation with anastomosis, bypass or graft as the cause of abnormal reaction of the patient, or of later complication, without mention of misadventure at the time of the procedure: Secondary | ICD-10-CM | POA: Insufficient documentation

## 2021-09-19 HISTORY — PX: INSERTION OF DIALYSIS CATHETER: SHX1324

## 2021-09-19 HISTORY — PX: LIGATION OF ARTERIOVENOUS  FISTULA: SHX5948

## 2021-09-19 LAB — GLUCOSE, CAPILLARY: Glucose-Capillary: 169 mg/dL — ABNORMAL HIGH (ref 70–99)

## 2021-09-19 LAB — POCT I-STAT, CHEM 8
BUN: 82 mg/dL — ABNORMAL HIGH (ref 8–23)
Calcium, Ion: 1.27 mmol/L (ref 1.15–1.40)
Chloride: 100 mmol/L (ref 98–111)
Creatinine, Ser: 4.3 mg/dL — ABNORMAL HIGH (ref 0.44–1.00)
Glucose, Bld: 149 mg/dL — ABNORMAL HIGH (ref 70–99)
HCT: 35 % — ABNORMAL LOW (ref 36.0–46.0)
Hemoglobin: 11.9 g/dL — ABNORMAL LOW (ref 12.0–15.0)
Potassium: 4 mmol/L (ref 3.5–5.1)
Sodium: 138 mmol/L (ref 135–145)
TCO2: 26 mmol/L (ref 22–32)

## 2021-09-19 SURGERY — LIGATION OF ARTERIOVENOUS  FISTULA
Anesthesia: General | Site: Chest | Laterality: Right

## 2021-09-19 MED ORDER — HEPARIN SODIUM (PORCINE) 1000 UNIT/ML IJ SOLN
INTRAMUSCULAR | Status: AC
  Filled 2021-09-19: qty 2

## 2021-09-19 MED ORDER — CHLORHEXIDINE GLUCONATE 4 % EX LIQD: 60.0000 mL | Freq: Once | CUTANEOUS | Status: DC

## 2021-09-19 MED ORDER — ORAL CARE MOUTH RINSE: 15.0000 mL | Freq: Once | OROMUCOSAL | Status: AC

## 2021-09-19 MED ORDER — LIDOCAINE HCL (CARDIAC) PF 100 MG/5ML IV SOSY
PREFILLED_SYRINGE | INTRAVENOUS | Status: DC | PRN
  Administered 2021-09-19: 80 mg via INTRATRACHEAL

## 2021-09-19 MED ORDER — PROPOFOL 10 MG/ML IV BOLUS
INTRAVENOUS | Status: DC | PRN
  Administered 2021-09-19: 50 mg via INTRAVENOUS
  Administered 2021-09-19: 20 mg via INTRAVENOUS
  Administered 2021-09-19: 10 mg via INTRAVENOUS
  Administered 2021-09-19: 20 mg via INTRAVENOUS
  Administered 2021-09-19: 10 mg via INTRAVENOUS
  Administered 2021-09-19: 20 mg via INTRAVENOUS

## 2021-09-19 MED ORDER — HEPARIN SODIUM (PORCINE) 1000 UNIT/ML IJ SOLN
INTRAMUSCULAR | Status: DC | PRN
  Administered 2021-09-19: 3800 [IU] via INTRA_ARTERIAL

## 2021-09-19 MED ORDER — LIDOCAINE-EPINEPHRINE 0.5 %-1:200000 IJ SOLN
INTRAMUSCULAR | Status: AC
  Filled 2021-09-19: qty 1

## 2021-09-19 MED ORDER — ONDANSETRON HCL 4 MG/2ML IJ SOLN: 4.0000 mg | Freq: Once | INTRAMUSCULAR | Status: DC | PRN

## 2021-09-19 MED ORDER — PROPOFOL 10 MG/ML IV BOLUS
INTRAVENOUS | Status: AC
  Filled 2021-09-19: qty 20

## 2021-09-19 MED ORDER — HEPARIN SODIUM (PORCINE) 1000 UNIT/ML IJ SOLN
INTRAMUSCULAR | Status: AC
  Filled 2021-09-19: qty 4

## 2021-09-19 MED ORDER — LIDOCAINE-EPINEPHRINE 0.5 %-1:200000 IJ SOLN
INTRAMUSCULAR | Status: DC | PRN
  Administered 2021-09-19: 13 mL

## 2021-09-19 MED ORDER — LIDOCAINE HCL (PF) 2 % IJ SOLN
INTRAMUSCULAR | Status: AC
  Filled 2021-09-19: qty 5

## 2021-09-19 MED ORDER — SODIUM CHLORIDE 0.9 % IV SOLN: INTRAVENOUS | Status: DC

## 2021-09-19 MED ORDER — CEFAZOLIN SODIUM-DEXTROSE 2-4 GM/100ML-% IV SOLN
2.0000 g | INTRAVENOUS | Status: AC
  Administered 2021-09-19: 2 g via INTRAVENOUS
  Filled 2021-09-19: qty 100

## 2021-09-19 MED ORDER — HEPARIN 6000 UNIT IRRIGATION SOLUTION
Status: DC | PRN
  Administered 2021-09-19: 1

## 2021-09-19 MED ORDER — CHLORHEXIDINE GLUCONATE 0.12 % MT SOLN
15.0000 mL | Freq: Once | OROMUCOSAL | Status: AC
  Administered 2021-09-19: 15 mL via OROMUCOSAL

## 2021-09-19 MED ORDER — 0.9 % SODIUM CHLORIDE (POUR BTL) OPTIME
TOPICAL | Status: DC | PRN
  Administered 2021-09-19: 1000 mL

## 2021-09-19 MED ORDER — PROPOFOL 500 MG/50ML IV EMUL
INTRAVENOUS | Status: DC | PRN
  Administered 2021-09-19: 50 ug/kg/min via INTRAVENOUS

## 2021-09-19 MED ORDER — ONDANSETRON HCL 4 MG/2ML IJ SOLN
INTRAMUSCULAR | Status: AC
  Filled 2021-09-19: qty 2

## 2021-09-19 SURGICAL SUPPLY — 43 items
ADH SKN CLS APL DERMABOND .7 (GAUZE/BANDAGES/DRESSINGS) ×4
BAG DECANTER FOR FLEXI CONT (MISCELLANEOUS) ×4 IMPLANT
BAG HAMPER (MISCELLANEOUS) ×4 IMPLANT
BIOPATCH RED 1 DISK 7.0 (GAUZE/BANDAGES/DRESSINGS) ×4 IMPLANT
CATH PALINDROME-P 23CM W/VT (CATHETERS) ×1 IMPLANT
COVER LIGHT HANDLE STERIS (MISCELLANEOUS) ×8 IMPLANT
COVER MAYO STAND XLG (MISCELLANEOUS) ×1 IMPLANT
COVER PROBE U/S 5X48 (MISCELLANEOUS) ×4 IMPLANT
COVER SURGICAL LIGHT HANDLE (MISCELLANEOUS) ×4 IMPLANT
DECANTER SPIKE VIAL GLASS SM (MISCELLANEOUS) ×4 IMPLANT
DERMABOND ADVANCED (GAUZE/BANDAGES/DRESSINGS) ×2
DERMABOND ADVANCED .7 DNX12 (GAUZE/BANDAGES/DRESSINGS) IMPLANT
DRAPE C-ARM FOLDED MOBILE STRL (DRAPES) ×4 IMPLANT
DRAPE CHEST BREAST 15X10 FENES (DRAPES) ×4 IMPLANT
GAUZE 4X4 16PLY ~~LOC~~+RFID DBL (SPONGE) ×4 IMPLANT
GAUZE SPONGE 4X4 12PLY STRL (GAUZE/BANDAGES/DRESSINGS) ×5 IMPLANT
GEL ULTRASOUND 20GR AQUASONIC (MISCELLANEOUS) ×4 IMPLANT
GLOVE BIOGEL PI IND STRL 7.0 (GLOVE) ×6 IMPLANT
GLOVE BIOGEL PI INDICATOR 7.0 (GLOVE) ×2
GLOVE SURG MICRO LTX SZ7.5 (GLOVE) ×4 IMPLANT
GOWN STRL REUS W/TWL LRG LVL3 (GOWN DISPOSABLE) ×16 IMPLANT
IV NS 500ML (IV SOLUTION) ×3
IV NS 500ML BAXH (IV SOLUTION) ×3 IMPLANT
KIT BLADEGUARD II DBL (SET/KITS/TRAYS/PACK) ×1 IMPLANT
KIT TURNOVER KIT A (KITS) ×4 IMPLANT
MANIFOLD NEPTUNE II (INSTRUMENTS) ×4 IMPLANT
MARKER SKIN DUAL TIP RULER LAB (MISCELLANEOUS) ×1 IMPLANT
NDL 18GX1X1/2 (RX/OR ONLY) (NEEDLE) ×3 IMPLANT
NDL HYPO 25GX1X1/2 BEV (NEEDLE) ×3 IMPLANT
NEEDLE 18GX1X1/2 (RX/OR ONLY) (NEEDLE) ×3 IMPLANT
NEEDLE 22X1 1/2 (OR ONLY) (NEEDLE) IMPLANT
NEEDLE HYPO 25GX1X1/2 BEV (NEEDLE) ×3 IMPLANT
PACK SURGICAL SETUP 50X90 (CUSTOM PROCEDURE TRAY) ×4 IMPLANT
PAD ARMBOARD 7.5X6 YLW CONV (MISCELLANEOUS) ×8 IMPLANT
SET BASIN LINEN APH (SET/KITS/TRAYS/PACK) ×4 IMPLANT
SOAP 2 % CHG 4 OZ (WOUND CARE) ×4 IMPLANT
SPONGE T-LAP 18X18 ~~LOC~~+RFID (SPONGE) ×1 IMPLANT
SUT ETHILON 3 0 PS 1 (SUTURE) ×4 IMPLANT
SUT VICRYL 4-0 PS2 18IN ABS (SUTURE) ×4 IMPLANT
SYR 10ML LL (SYRINGE) ×4 IMPLANT
SYR 5ML LL (SYRINGE) ×8 IMPLANT
SYR CONTROL 10ML LL (SYRINGE) ×4 IMPLANT
TOWEL GREEN STERILE (TOWEL DISPOSABLE) ×4 IMPLANT

## 2021-09-19 NOTE — Interval H&P Note (Signed)
History and Physical Interval Note:  09/19/2021 9:12 AM  Margaret Huerta  has presented today for surgery, with the diagnosis of End Stage Renal Disease .  The various methods of treatment have been discussed with the patient and family. After consideration of risks, benefits and other options for treatment, the patient has consented to  Procedure(s): LIGATION OF ARTERIOVENOUS  FISTULA (Left) INSERTION OF TUNNELED DIALYSIS CATHETER (N/A) as a surgical intervention.  The patient's history has been reviewed, patient examined, no change in status, stable for surgery.  I have reviewed the patient's chart and labs.  Questions were answered to the patient's satisfaction.     Curt Jews

## 2021-09-19 NOTE — Anesthesia Postprocedure Evaluation (Signed)
Anesthesia Post Note  Patient: [name]  Procedure(s) Performed: LIGATION OF ARTERIOVENOUS  FISTULA (Left: Arm Upper) INSERTION OF TUNNELED DIALYSIS CATHETER (Right: Chest)  Patient location during evaluation: Phase II Anesthesia Type: General Level of consciousness: awake and alert and oriented Pain management: pain level controlled Vital Signs Assessment: post-procedure vital signs reviewed and stable Respiratory status: spontaneous breathing, nonlabored ventilation and respiratory function stable Cardiovascular status: blood pressure returned to baseline and stable Postop Assessment: no apparent nausea or vomiting Anesthetic complications: no   No notable events documented.   Last Vitals:  Vitals:   09/19/21 1115 09/19/21 1130  BP: (!) 161/64 (!) 166/68  Pulse: (!) 58 (!) 58  Resp: 13 11  Temp:    SpO2: 97% 98%    Last Pain:  Vitals:   09/19/21 1130  TempSrc:   PainSc: 0-No pain                 Keaden Gunnoe C Kamla Skilton

## 2021-09-19 NOTE — Op Note (Signed)
    OPERATIVE REPORT  DATE OF SURGERY: 09/19/2021  PATIENT: Margaret Huerta, 76 y.o. female MRN: [id number]  DOB: December 01, 1945  PRE-OPERATIVE DIAGNOSIS: Chronic renal insufficiency  POST-OPERATIVE DIAGNOSIS:  Same  PROCEDURE: #1 right IJ tunneled hemodialysis catheter placement, #2 ligation of left brachiocephalic fistula for steal  SURGEON:  Curt Jews, M.D.  PHYSICIAN ASSISTANT: Nurse  The assistant was needed for exposure and to expedite the case  ANESTHESIA: 1% lidocaine local with sedation  EBL: per anesthesia record  Total I/O In: 400 [I.V.:300; IV Piggyback:100] Out: 10 [Blood:10]  BLOOD ADMINISTERED: none  DRAINS: none  SPECIMEN: none  COUNTS CORRECT:  YES  PATIENT DISPOSITION:  PACU - hemodynamically stable  PROCEDURE DETAILS: Patient was taken operating placed evaluation of the area of the right and left neck and chest prepped draped you sterile fashion.  SonoSite ultrasound was used to visualize the right internal jugular vein which was widely patent.  With the patient in Trendelenburg position, the right internal jugular vein was accessed under direct vision with an 18-gauge needle and the guidewire was passed centrally.  This was confirmed with fluoroscopy.  A separate incision was made further inferior lateral and 23 cm catheter was brought through the subcutaneous tunnel to the wire the guidewire entry site.  The guidewire was left in place and the 18-gauge needle was removed.  A dilator and peel-away sheath was passed over the guidewire and the dilator and guidewire removed.  The catheter was passed through the peel-away sheath and the peel-away sheath was removed.  The catheter tip was positioned in the level of the distal right atrium.  The catheter was secured to the skin with a 3-0 nylon stitch and the entry site was closed with a 4-0 subcuticular Vicryl stitch.  Tension was then turned to the left arm.  The patient was having steal syndrome in the left hand.   The left arm was prepped and draped in usual sterile fashion.  Using local anesthesia, incision was made over the cephalic vein at the brachial artery anastomosis.  The vein was doubly ligated with 2-0 silk tie near the arterial anastomosis.  The wound was irrigated with saline.  Hemostasis was obtained with electrocautery.  The wound was closed with 3-0 Vicryl in the subcutaneous and subcuticular tissue.  The patient was transferred to the recovery room with chest x-ray pending   Rosetta Posner, M.D., [facility] 09/19/2021 10:53 AM  Note: Portions of this report may have been transcribed using voice recognition software.  Every effort has been made to ensure accuracy; however, inadvertent computerized transcription errors may still be present.

## 2021-09-19 NOTE — Transfer of Care (Signed)
Immediate Anesthesia Transfer of Care Note  Patient: Margaret Huerta  Procedure(s) Performed: LIGATION OF ARTERIOVENOUS  FISTULA (Left: Arm Upper) INSERTION OF TUNNELED DIALYSIS CATHETER (Right: Chest)  Patient Location: PACU  Anesthesia Type:General  Level of Consciousness: awake  Airway & Oxygen Therapy: Patient Spontanous Breathing  Post-op Assessment: Report given to RN and Post -op Vital signs reviewed and stable  Post vital signs: Reviewed and stable  Last Vitals:  Vitals Value Taken Time  BP 142/62 09/19/21 1050  Temp 97.5   Pulse 58 09/19/21 1051  Resp 12 09/19/21 1051  SpO2 96 % 09/19/21 1051  Vitals shown include unvalidated device data.  Last Pain:  Vitals:   09/19/21 0812  TempSrc: Oral         Complications: No notable events documented.

## 2021-09-19 NOTE — Anesthesia Preprocedure Evaluation (Addendum)
Anesthesia Evaluation  Patient identified by MRN, date of birth, ID band Patient awake    Reviewed: Allergy & Precautions, NPO status , Patient's Chart, lab work & pertinent test results, reviewed documented beta blocker date and time   History of Anesthesia Complications (+) PROLONGED EMERGENCE and history of anesthetic complications  Airway Mallampati: II  TM Distance: >3 FB Neck ROM: Full    Dental  (+) Dental Advisory Given, Poor Dentition, Loose, Missing,    Pulmonary sleep apnea , pneumonia,    Pulmonary exam normal breath sounds clear to auscultation       Cardiovascular hypertension, Pt. on home beta blockers and Pt. on medications  Rhythm:Regular Rate:Normal + Systolic murmurs    Neuro/Psych  Headaches, Seizures -,  PSYCHIATRIC DISORDERS Anxiety Depression  Neuromuscular disease    GI/Hepatic Neg liver ROS, hiatal hernia, GERD  Controlled,  Endo/Other  diabetes  Renal/GU Renal disease (renal cell cancer - right nephrectomy)  negative genitourinary   Musculoskeletal  (+) Fibromyalgia -  Abdominal   Peds negative pediatric ROS (+)  Hematology  (+) Blood dyscrasia, anemia ,   Anesthesia Other Findings   Reproductive/Obstetrics negative OB ROS                           Anesthesia Physical Anesthesia Plan  ASA: 3  Anesthesia Plan: General   Post-op Pain Management: Minimal or no pain anticipated   Induction: Intravenous  PONV Risk Score and Plan: Propofol infusion  Airway Management Planned: Nasal Cannula and Natural Airway  Additional Equipment:   Intra-op Plan:   Post-operative Plan:   Informed Consent: I have reviewed the patients History and Physical, chart, labs and discussed the procedure including the risks, benefits and alternatives for the proposed anesthesia with the patient or authorized representative who has indicated his/her understanding and acceptance.      Dental advisory given  Plan Discussed with: CRNA and Surgeon  Anesthesia Plan Comments:        Anesthesia Quick Evaluation

## 2021-09-19 NOTE — Telephone Encounter (Signed)
-----   Message from Rosetta Posner, MD sent at 09/19/2021 10:56 AM EDT -----  Tunneled hemodialysis catheter placement.  Also had ligation of left arm AV fistula for steal.  I need to see her in the office in about 3 weeks for follow-up.  Does not need duplex

## 2021-09-22 ENCOUNTER — Encounter (HOSPITAL_COMMUNITY): Payer: Self-pay | Admitting: Vascular Surgery

## 2021-09-22 DIAGNOSIS — Z992 Dependence on renal dialysis: Secondary | ICD-10-CM | POA: Diagnosis not present

## 2021-09-22 DIAGNOSIS — N186 End stage renal disease: Secondary | ICD-10-CM | POA: Diagnosis not present

## 2021-09-25 DIAGNOSIS — Z992 Dependence on renal dialysis: Secondary | ICD-10-CM | POA: Diagnosis not present

## 2021-09-25 DIAGNOSIS — N186 End stage renal disease: Secondary | ICD-10-CM | POA: Diagnosis not present

## 2021-09-27 DIAGNOSIS — Z992 Dependence on renal dialysis: Secondary | ICD-10-CM | POA: Diagnosis not present

## 2021-09-27 DIAGNOSIS — N186 End stage renal disease: Secondary | ICD-10-CM | POA: Diagnosis not present

## 2021-09-27 NOTE — Telephone Encounter (Signed)
Appointment scheduled.

## 2021-09-29 DIAGNOSIS — Z992 Dependence on renal dialysis: Secondary | ICD-10-CM | POA: Diagnosis not present

## 2021-09-29 DIAGNOSIS — N186 End stage renal disease: Secondary | ICD-10-CM | POA: Diagnosis not present

## 2021-10-02 DIAGNOSIS — Z992 Dependence on renal dialysis: Secondary | ICD-10-CM | POA: Diagnosis not present

## 2021-10-02 DIAGNOSIS — N186 End stage renal disease: Secondary | ICD-10-CM | POA: Diagnosis not present

## 2021-10-04 DIAGNOSIS — N186 End stage renal disease: Secondary | ICD-10-CM | POA: Diagnosis not present

## 2021-10-04 DIAGNOSIS — Z992 Dependence on renal dialysis: Secondary | ICD-10-CM | POA: Diagnosis not present

## 2021-10-06 DIAGNOSIS — Z992 Dependence on renal dialysis: Secondary | ICD-10-CM | POA: Diagnosis not present

## 2021-10-06 DIAGNOSIS — N186 End stage renal disease: Secondary | ICD-10-CM | POA: Diagnosis not present

## 2021-10-09 DIAGNOSIS — N186 End stage renal disease: Secondary | ICD-10-CM | POA: Diagnosis not present

## 2021-10-09 DIAGNOSIS — Z992 Dependence on renal dialysis: Secondary | ICD-10-CM | POA: Diagnosis not present

## 2021-10-11 ENCOUNTER — Ambulatory Visit (INDEPENDENT_AMBULATORY_CARE_PROVIDER_SITE_OTHER): Payer: Medicare Other | Admitting: Vascular Surgery

## 2021-10-11 ENCOUNTER — Encounter: Payer: Self-pay | Admitting: Vascular Surgery

## 2021-10-11 ENCOUNTER — Encounter: Payer: PRIVATE HEALTH INSURANCE | Admitting: Vascular Surgery

## 2021-10-11 VITALS — BP 144/75 | HR 68 | Temp 97.0°F | Ht 62.0 in | Wt 163.6 lb

## 2021-10-11 DIAGNOSIS — Z992 Dependence on renal dialysis: Secondary | ICD-10-CM

## 2021-10-11 DIAGNOSIS — N186 End stage renal disease: Secondary | ICD-10-CM

## 2021-10-11 NOTE — Progress Notes (Signed)
[facility] of [place]  Patient name: Margaret Huerta MRN: [id number] DOB: [birth year] Sex: female  REASON FOR VISIT: Follow-up hemodialysis access  HPI: Margaret Huerta is a 76 y.o. female here today for follow-up.  She underwent placement of a right IJ tunneled hemodialysis catheter and ligation of her left AV fistula for steal on 09/19/2021.  He had a left brachiocephalic fistula which has matured nicely.  Unfortunately she had progressive steal and was having sensations of coolness and numbness and weakness in her left hand.  She underwent ligation and reports that her left hand discontinued completely due to her normal baseline.  She is having no difficulty with her hemodialysis catheter.  Current Outpatient Medications  Medication Sig Dispense Refill   acetaminophen (TYLENOL) 500 MG tablet Take 500 mg by mouth every 6 (six) hours as needed (for pain.).     allopurinol (ZYLOPRIM) 300 MG tablet Take 300 mg by mouth in the morning.  12   ALPRAZolam (XANAX) 0.25 MG tablet Take 0.25 mg by mouth 2 (two) times daily as needed for anxiety.     aspirin EC 81 MG tablet Take 81 mg by mouth in the morning. Swallow whole.     atenolol (TENORMIN) 50 MG tablet Take 50 mg by mouth 2 (two) times daily.     calcitRIOL (ROCALTROL) 0.25 MCG capsule Take 0.25 mcg by mouth in the morning.     Cholecalciferol (VITAMIN D3) 250 MCG (10000 UT) TABS Take 10,000 Units by mouth in the morning.     docusate sodium (COLACE) 50 MG capsule Take 50 mg by mouth at bedtime.     DULoxetine (CYMBALTA) 60 MG capsule Take 60 mg by mouth in the morning.     furosemide (LASIX) 40 MG tablet Take 40 mg by mouth See admin instructions. Take 2 tablets (80 mg) by mouth in the morning & take 1 tablet (40 mg) by mouth at night.     hydrALAZINE (APRESOLINE) 50 MG tablet Take 100 mg by mouth in the morning, at noon, and at bedtime.     isosorbide dinitrate (ISORDIL) 10 MG tablet Take 1  tablet (10 mg total) by mouth 2 (two) times daily. 180 tablet 3   isosorbide dinitrate (ISORDIL) 10 MG tablet Take 1 tablet by mouth 2 (two) times daily.     NITROSTAT 0.4 MG SL tablet Place 1 tablet (0.4 mg total) under the tongue as needed. 25 tablet 3   rosuvastatin (CRESTOR) 10 MG tablet Take 1 tablet (10 mg total) by mouth daily.     sevelamer carbonate (RENVELA) 800 MG tablet Take 800 mg by mouth 3 (three) times daily.     sodium bicarbonate 650 MG tablet Take 650 mg by mouth 2 (two) times daily.     No current facility-administered medications for this visit.     PHYSICAL EXAM: Vitals:   10/11/21 0918  BP: (!) 144/75  Pulse: 68  Temp: (!) 97 F (36.1 C)  TempSrc: Temporal  SpO2: 96%  Weight: 163 lb 9.6 oz (74.2 kg)  Height: '5\' 2"'$  (1.575 m)    GENERAL: The patient is a well-nourished female, in no acute distress. The vital signs are documented above. 2+ radial pulses bilaterally.  Well-healed left antecubital incision.  I did image her right arm veins with SonoSite ultrasound which showed moderate cephalic vein from the antecubital space proximally and also moderate basilic vein in the medial arm  MEDICAL ISSUES: The patient reports that she is to have  an evaluation for potential peritoneal dialysis.  I did explain the patient that she is at increased risk for steal in her right arm since she suffered from steal in her left arm.  If she is a peritoneal dialysis that it would reserve right arm access for a later date.  I feel that her next access would be right arm AV fistula.   Rosetta Posner, MD FACS [facility] [phone]  Note: Portions of this report may have been transcribed using voice recognition software.  Every effort has been made to ensure accuracy; however, inadvertent computerized transcription errors may still be present.

## 2021-10-12 DIAGNOSIS — N186 End stage renal disease: Secondary | ICD-10-CM | POA: Diagnosis not present

## 2021-10-12 DIAGNOSIS — Z992 Dependence on renal dialysis: Secondary | ICD-10-CM | POA: Diagnosis not present

## 2021-10-17 DIAGNOSIS — Z85528 Personal history of other malignant neoplasm of kidney: Secondary | ICD-10-CM | POA: Diagnosis not present

## 2021-10-17 DIAGNOSIS — N186 End stage renal disease: Secondary | ICD-10-CM | POA: Diagnosis not present

## 2021-10-17 DIAGNOSIS — E785 Hyperlipidemia, unspecified: Secondary | ICD-10-CM | POA: Diagnosis not present

## 2021-10-17 DIAGNOSIS — E1149 Type 2 diabetes mellitus with other diabetic neurological complication: Secondary | ICD-10-CM | POA: Diagnosis not present

## 2021-10-17 DIAGNOSIS — I10 Essential (primary) hypertension: Secondary | ICD-10-CM | POA: Diagnosis not present

## 2021-10-17 DIAGNOSIS — I120 Hypertensive chronic kidney disease with stage 5 chronic kidney disease or end stage renal disease: Secondary | ICD-10-CM | POA: Diagnosis not present

## 2021-10-17 DIAGNOSIS — Z992 Dependence on renal dialysis: Secondary | ICD-10-CM | POA: Diagnosis not present

## 2021-10-17 DIAGNOSIS — G4733 Obstructive sleep apnea (adult) (pediatric): Secondary | ICD-10-CM | POA: Diagnosis not present

## 2021-10-20 DIAGNOSIS — Z992 Dependence on renal dialysis: Secondary | ICD-10-CM | POA: Diagnosis not present

## 2021-10-20 DIAGNOSIS — N186 End stage renal disease: Secondary | ICD-10-CM | POA: Diagnosis not present

## 2021-10-23 DIAGNOSIS — Z992 Dependence on renal dialysis: Secondary | ICD-10-CM | POA: Diagnosis not present

## 2021-10-23 DIAGNOSIS — N186 End stage renal disease: Secondary | ICD-10-CM | POA: Diagnosis not present

## 2021-10-25 DIAGNOSIS — Z992 Dependence on renal dialysis: Secondary | ICD-10-CM | POA: Diagnosis not present

## 2021-10-25 DIAGNOSIS — N186 End stage renal disease: Secondary | ICD-10-CM | POA: Diagnosis not present

## 2021-10-26 DIAGNOSIS — E785 Hyperlipidemia, unspecified: Secondary | ICD-10-CM | POA: Diagnosis not present

## 2021-10-26 DIAGNOSIS — Z992 Dependence on renal dialysis: Secondary | ICD-10-CM | POA: Diagnosis not present

## 2021-10-26 DIAGNOSIS — N183 Chronic kidney disease, stage 3 unspecified: Secondary | ICD-10-CM | POA: Diagnosis not present

## 2021-10-26 DIAGNOSIS — K219 Gastro-esophageal reflux disease without esophagitis: Secondary | ICD-10-CM | POA: Diagnosis not present

## 2021-10-26 DIAGNOSIS — I120 Hypertensive chronic kidney disease with stage 5 chronic kidney disease or end stage renal disease: Secondary | ICD-10-CM | POA: Diagnosis not present

## 2021-10-26 DIAGNOSIS — I10 Essential (primary) hypertension: Secondary | ICD-10-CM | POA: Diagnosis not present

## 2021-10-26 DIAGNOSIS — Z85528 Personal history of other malignant neoplasm of kidney: Secondary | ICD-10-CM | POA: Diagnosis not present

## 2021-10-26 DIAGNOSIS — E1122 Type 2 diabetes mellitus with diabetic chronic kidney disease: Secondary | ICD-10-CM | POA: Diagnosis not present

## 2021-10-26 DIAGNOSIS — Z885 Allergy status to narcotic agent status: Secondary | ICD-10-CM | POA: Diagnosis not present

## 2021-10-26 DIAGNOSIS — E1149 Type 2 diabetes mellitus with other diabetic neurological complication: Secondary | ICD-10-CM | POA: Diagnosis not present

## 2021-10-26 DIAGNOSIS — Z91041 Radiographic dye allergy status: Secondary | ICD-10-CM | POA: Diagnosis not present

## 2021-10-26 DIAGNOSIS — G4733 Obstructive sleep apnea (adult) (pediatric): Secondary | ICD-10-CM | POA: Diagnosis not present

## 2021-10-26 DIAGNOSIS — Z91011 Allergy to milk products: Secondary | ICD-10-CM | POA: Diagnosis not present

## 2021-10-26 DIAGNOSIS — Z886 Allergy status to analgesic agent status: Secondary | ICD-10-CM | POA: Diagnosis not present

## 2021-10-26 DIAGNOSIS — G4730 Sleep apnea, unspecified: Secondary | ICD-10-CM | POA: Diagnosis not present

## 2021-10-26 DIAGNOSIS — E7800 Pure hypercholesterolemia, unspecified: Secondary | ICD-10-CM | POA: Diagnosis not present

## 2021-10-26 DIAGNOSIS — N186 End stage renal disease: Secondary | ICD-10-CM | POA: Diagnosis not present

## 2021-10-26 DIAGNOSIS — Z888 Allergy status to other drugs, medicaments and biological substances status: Secondary | ICD-10-CM | POA: Diagnosis not present

## 2021-10-26 DIAGNOSIS — K660 Peritoneal adhesions (postprocedural) (postinfection): Secondary | ICD-10-CM | POA: Diagnosis not present

## 2021-10-26 DIAGNOSIS — I2510 Atherosclerotic heart disease of native coronary artery without angina pectoris: Secondary | ICD-10-CM | POA: Diagnosis not present

## 2021-10-27 DIAGNOSIS — Z992 Dependence on renal dialysis: Secondary | ICD-10-CM | POA: Diagnosis not present

## 2021-10-27 DIAGNOSIS — N186 End stage renal disease: Secondary | ICD-10-CM | POA: Diagnosis not present

## 2021-10-30 DIAGNOSIS — Z992 Dependence on renal dialysis: Secondary | ICD-10-CM | POA: Diagnosis not present

## 2021-10-30 DIAGNOSIS — N186 End stage renal disease: Secondary | ICD-10-CM | POA: Diagnosis not present

## 2021-11-01 DIAGNOSIS — N186 End stage renal disease: Secondary | ICD-10-CM | POA: Diagnosis not present

## 2021-11-01 DIAGNOSIS — Z992 Dependence on renal dialysis: Secondary | ICD-10-CM | POA: Diagnosis not present

## 2021-11-03 DIAGNOSIS — Z992 Dependence on renal dialysis: Secondary | ICD-10-CM | POA: Diagnosis not present

## 2021-11-03 DIAGNOSIS — N186 End stage renal disease: Secondary | ICD-10-CM | POA: Diagnosis not present

## 2021-11-06 ENCOUNTER — Encounter (HOSPITAL_COMMUNITY): Payer: Self-pay | Admitting: Emergency Medicine

## 2021-11-06 ENCOUNTER — Encounter (INDEPENDENT_AMBULATORY_CARE_PROVIDER_SITE_OTHER): Payer: Self-pay | Admitting: *Deleted

## 2021-11-06 ENCOUNTER — Other Ambulatory Visit: Payer: Self-pay

## 2021-11-06 ENCOUNTER — Emergency Department (HOSPITAL_COMMUNITY): Payer: Medicare Other

## 2021-11-06 DIAGNOSIS — Z992 Dependence on renal dialysis: Secondary | ICD-10-CM | POA: Diagnosis not present

## 2021-11-06 DIAGNOSIS — E46 Unspecified protein-calorie malnutrition: Secondary | ICD-10-CM | POA: Insufficient documentation

## 2021-11-06 DIAGNOSIS — E8809 Other disorders of plasma-protein metabolism, not elsewhere classified: Secondary | ICD-10-CM | POA: Diagnosis not present

## 2021-11-06 DIAGNOSIS — N186 End stage renal disease: Secondary | ICD-10-CM | POA: Insufficient documentation

## 2021-11-06 DIAGNOSIS — Z96698 Presence of other orthopedic joint implants: Secondary | ICD-10-CM | POA: Insufficient documentation

## 2021-11-06 DIAGNOSIS — E1140 Type 2 diabetes mellitus with diabetic neuropathy, unspecified: Secondary | ICD-10-CM | POA: Insufficient documentation

## 2021-11-06 DIAGNOSIS — E1165 Type 2 diabetes mellitus with hyperglycemia: Secondary | ICD-10-CM | POA: Insufficient documentation

## 2021-11-06 DIAGNOSIS — E1122 Type 2 diabetes mellitus with diabetic chronic kidney disease: Secondary | ICD-10-CM | POA: Diagnosis not present

## 2021-11-06 DIAGNOSIS — I120 Hypertensive chronic kidney disease with stage 5 chronic kidney disease or end stage renal disease: Secondary | ICD-10-CM | POA: Diagnosis not present

## 2021-11-06 DIAGNOSIS — T85691A Other mechanical complication of intraperitoneal dialysis catheter, initial encounter: Secondary | ICD-10-CM | POA: Diagnosis not present

## 2021-11-06 DIAGNOSIS — I2510 Atherosclerotic heart disease of native coronary artery without angina pectoris: Secondary | ICD-10-CM | POA: Diagnosis not present

## 2021-11-06 DIAGNOSIS — Z8541 Personal history of malignant neoplasm of cervix uteri: Secondary | ICD-10-CM | POA: Diagnosis not present

## 2021-11-06 DIAGNOSIS — T85838A Hemorrhage due to other internal prosthetic devices, implants and grafts, initial encounter: Secondary | ICD-10-CM | POA: Diagnosis present

## 2021-11-06 DIAGNOSIS — R109 Unspecified abdominal pain: Secondary | ICD-10-CM | POA: Diagnosis not present

## 2021-11-06 DIAGNOSIS — G8918 Other acute postprocedural pain: Secondary | ICD-10-CM | POA: Diagnosis not present

## 2021-11-06 DIAGNOSIS — Y841 Kidney dialysis as the cause of abnormal reaction of the patient, or of later complication, without mention of misadventure at the time of the procedure: Secondary | ICD-10-CM | POA: Diagnosis not present

## 2021-11-06 DIAGNOSIS — I700 Atherosclerosis of aorta: Secondary | ICD-10-CM | POA: Diagnosis not present

## 2021-11-06 DIAGNOSIS — Z85528 Personal history of other malignant neoplasm of kidney: Secondary | ICD-10-CM | POA: Insufficient documentation

## 2021-11-06 NOTE — ED Triage Notes (Addendum)
Pt had PD catheter placed on 10/26/21. States she was asleep tonight and felt a sharp pain at the insertion site and then noticed bleeding at site. Site remains bandaged but blood noted on dsg. Pt still c/o R sided abdominal pain. States she was seen at [facility] today and they instilled fluid through catheter but were unable to get the fluid to drain out. Stated nurse at [facility] told them "it probably had a blood clot in it and so they put a clot buster in it" and told to return tomorrow.

## 2021-11-07 ENCOUNTER — Observation Stay (HOSPITAL_COMMUNITY)
Admission: EM | Admit: 2021-11-07 | Discharge: 2021-11-07 | Disposition: A | Discharge status: Home / Self Care | Payer: Medicare Other | Attending: Internal Medicine | Admitting: Internal Medicine

## 2021-11-07 DIAGNOSIS — E46 Unspecified protein-calorie malnutrition: Secondary | ICD-10-CM | POA: Diagnosis not present

## 2021-11-07 DIAGNOSIS — Z8541 Personal history of malignant neoplasm of cervix uteri: Secondary | ICD-10-CM | POA: Diagnosis not present

## 2021-11-07 DIAGNOSIS — R739 Hyperglycemia, unspecified: Secondary | ICD-10-CM | POA: Diagnosis not present

## 2021-11-07 DIAGNOSIS — N250 Renal osteodystrophy: Secondary | ICD-10-CM | POA: Diagnosis not present

## 2021-11-07 DIAGNOSIS — I2510 Atherosclerotic heart disease of native coronary artery without angina pectoris: Secondary | ICD-10-CM | POA: Diagnosis not present

## 2021-11-07 DIAGNOSIS — E1165 Type 2 diabetes mellitus with hyperglycemia: Secondary | ICD-10-CM | POA: Diagnosis not present

## 2021-11-07 DIAGNOSIS — E8809 Other disorders of plasma-protein metabolism, not elsewhere classified: Secondary | ICD-10-CM | POA: Diagnosis not present

## 2021-11-07 DIAGNOSIS — T85611A Breakdown (mechanical) of intraperitoneal dialysis catheter, initial encounter: Principal | ICD-10-CM

## 2021-11-07 DIAGNOSIS — E782 Mixed hyperlipidemia: Secondary | ICD-10-CM | POA: Diagnosis not present

## 2021-11-07 DIAGNOSIS — Z85528 Personal history of other malignant neoplasm of kidney: Secondary | ICD-10-CM | POA: Diagnosis not present

## 2021-11-07 DIAGNOSIS — Z96698 Presence of other orthopedic joint implants: Secondary | ICD-10-CM | POA: Diagnosis not present

## 2021-11-07 DIAGNOSIS — E1129 Type 2 diabetes mellitus with other diabetic kidney complication: Secondary | ICD-10-CM | POA: Diagnosis not present

## 2021-11-07 DIAGNOSIS — T859XXA Unspecified complication of internal prosthetic device, implant and graft, initial encounter: Secondary | ICD-10-CM | POA: Diagnosis not present

## 2021-11-07 DIAGNOSIS — Y841 Kidney dialysis as the cause of abnormal reaction of the patient, or of later complication, without mention of misadventure at the time of the procedure: Secondary | ICD-10-CM | POA: Diagnosis not present

## 2021-11-07 DIAGNOSIS — I10 Essential (primary) hypertension: Secondary | ICD-10-CM | POA: Diagnosis not present

## 2021-11-07 DIAGNOSIS — Z992 Dependence on renal dialysis: Secondary | ICD-10-CM | POA: Diagnosis not present

## 2021-11-07 DIAGNOSIS — D631 Anemia in chronic kidney disease: Secondary | ICD-10-CM | POA: Diagnosis not present

## 2021-11-07 DIAGNOSIS — T85691A Other mechanical complication of intraperitoneal dialysis catheter, initial encounter: Secondary | ICD-10-CM | POA: Diagnosis not present

## 2021-11-07 DIAGNOSIS — T85838A Hemorrhage due to other internal prosthetic devices, implants and grafts, initial encounter: Secondary | ICD-10-CM | POA: Diagnosis present

## 2021-11-07 DIAGNOSIS — G8918 Other acute postprocedural pain: Secondary | ICD-10-CM

## 2021-11-07 DIAGNOSIS — I120 Hypertensive chronic kidney disease with stage 5 chronic kidney disease or end stage renal disease: Secondary | ICD-10-CM | POA: Diagnosis not present

## 2021-11-07 DIAGNOSIS — E1122 Type 2 diabetes mellitus with diabetic chronic kidney disease: Secondary | ICD-10-CM | POA: Diagnosis not present

## 2021-11-07 DIAGNOSIS — N186 End stage renal disease: Secondary | ICD-10-CM | POA: Diagnosis not present

## 2021-11-07 DIAGNOSIS — E1140 Type 2 diabetes mellitus with diabetic neuropathy, unspecified: Secondary | ICD-10-CM | POA: Diagnosis not present

## 2021-11-07 LAB — COMPREHENSIVE METABOLIC PANEL
ALT: 13 U/L (ref 0–44)
AST: 22 U/L (ref 15–41)
Albumin: 3.3 g/dL — ABNORMAL LOW (ref 3.5–5.0)
Alkaline Phosphatase: 72 U/L (ref 38–126)
Anion gap: 9 (ref 5–15)
BUN: 14 mg/dL (ref 8–23)
CO2: 25 mmol/L (ref 22–32)
Calcium: 9.2 mg/dL (ref 8.9–10.3)
Chloride: 101 mmol/L (ref 98–111)
Creatinine, Ser: 2.91 mg/dL — ABNORMAL HIGH (ref 0.44–1.00)
GFR, Estimated: 16 mL/min — ABNORMAL LOW (ref >60)
Glucose, Bld: 147 mg/dL — ABNORMAL HIGH (ref 70–99)
Potassium: 3.6 mmol/L (ref 3.5–5.1)
Sodium: 135 mmol/L (ref 135–145)
Total Bilirubin: 0.7 mg/dL (ref 0.3–1.2)
Total Protein: 6.4 g/dL — ABNORMAL LOW (ref 6.5–8.1)

## 2021-11-07 LAB — CBC
HCT: 36.3 % (ref 36.0–46.0)
HCT: 41.8 % (ref 36.0–46.0)
Hemoglobin: 11.7 g/dL — ABNORMAL LOW (ref 12.0–15.0)
Hemoglobin: 13 g/dL (ref 12.0–15.0)
MCH: 28.1 pg (ref 26.0–34.0)
MCH: 28.3 pg (ref 26.0–34.0)
MCHC: 31.1 g/dL (ref 30.0–36.0)
MCHC: 32.2 g/dL (ref 30.0–36.0)
MCV: 87.9 fL (ref 80.0–100.0)
MCV: 90.5 fL (ref 80.0–100.0)
Platelets: 187 10*3/uL (ref 150–400)
Platelets: 221 10*3/uL (ref 150–400)
RBC: 4.13 MIL/uL (ref 3.87–5.11)
RBC: 4.62 MIL/uL (ref 3.87–5.11)
RDW: 14.9 % (ref 11.5–15.5)
RDW: 14.9 % (ref 11.5–15.5)
WBC: 5.7 10*3/uL (ref 4.0–10.5)
WBC: 7.7 10*3/uL (ref 4.0–10.5)
nRBC: 0 % (ref 0.0–0.2)
nRBC: 0 % (ref 0.0–0.2)

## 2021-11-07 LAB — LIPASE, BLOOD: Lipase: 34 U/L (ref 11–51)

## 2021-11-07 MED ORDER — ENSURE ENLIVE PO LIQD: 237.0000 mL | Freq: Two times a day (BID) | ORAL | Status: DC

## 2021-11-07 MED ORDER — ONDANSETRON HCL 4 MG PO TABS: 4.0000 mg | ORAL_TABLET | Freq: Four times a day (QID) | ORAL | Status: DC | PRN

## 2021-11-07 MED ORDER — ONDANSETRON HCL 4 MG/2ML IJ SOLN: 4.0000 mg | Freq: Four times a day (QID) | INTRAMUSCULAR | Status: DC | PRN

## 2021-11-07 MED ORDER — ACETAMINOPHEN 500 MG PO TABS
1000.0000 mg | ORAL_TABLET | Freq: Once | ORAL | Status: AC
  Administered 2021-11-07: 1000 mg via ORAL
  Filled 2021-11-07: qty 2

## 2021-11-07 NOTE — TOC Initial Note (Signed)
Transition of Care [facility]) - Initial/Assessment Note    Patient Details  Name: Margaret Huerta MRN: [id number] Date of Birth: 1946/01/22  Transition of Care [facility]) CM/SW Contact:    Marilu Favre, RN Phone Number: 11/07/2021, 12:43 PM  Clinical Narrative:                  Spoke to patient at bedside. Confirmed face sheet information.   PCP [name]     Transition of Care Department [facility]) will continue to monitor patient advancement through interdisciplinary progression rounds. If new patient transition needs arise, please place a TOC consult.    Expected Discharge Plan: Home/Self Care Barriers to Discharge: Continued Medical Work up   Patient Goals and CMS Choice Patient states their goals for this hospitalization and ongoing recovery are:: to return to home CMS Medicare.gov Compare Post Acute Care list provided to:: Patient    Expected Discharge Plan and Services Expected Discharge Plan: Home/Self Care   Discharge Planning Services: CM Consult Post Acute Care Choice: NA                   DME Arranged: N/A DME Agency: NA       HH Arranged: NA          Prior Living Arrangements/Services   Lives with:: Spouse Patient language and need for interpreter reviewed:: Yes Do you feel safe going back to the place where you live?: Yes      Need for Family Participation in Patient Care: Yes (Comment) Care giver support system in place?: Yes (comment)   Criminal Activity/Legal Involvement Pertinent to Current Situation/Hospitalization: No - Comment as needed  Activities of Daily Living Home Assistive Devices/Equipment: Eyeglasses, Blood pressure cuff, Walker (specify type) ADL Screening (condition at time of admission) Patient's cognitive ability adequate to safely complete daily activities?: Yes Is the patient deaf or have difficulty hearing?: No Does the patient have difficulty seeing, even when wearing glasses/contacts?: No Does the patient have difficulty  concentrating, remembering, or making decisions?: No Patient able to express need for assistance with ADLs?: Yes Does the patient have difficulty dressing or bathing?: Yes Independently performs ADLs?: Yes (appropriate for developmental age) Does the patient have difficulty walking or climbing stairs?: Yes Weakness of Legs: None Weakness of Arms/Hands: None  Permission Sought/Granted   Permission granted to share information with : No              Emotional Assessment Appearance:: Appears stated age Attitude/Demeanor/Rapport: Engaged Affect (typically observed): Accepting Orientation: : Oriented to Self, Oriented to Place, Oriented to  Time, Oriented to Situation Alcohol / Substance Use: Not Applicable Psych Involvement: No (comment)  Admission diagnosis:  Post-op pain [G89.18] Peritoneal dialysis catheter dysfunction, initial encounter [facility]) [M76.720N] Disorder of peritoneal dialysis catheter [T85.9XXA] PD catheter dysfunction ([place]) [T85.611A] Patient Active Problem List   Diagnosis Date Noted   Disorder of peritoneal dialysis catheter 11/07/2021   Mixed hyperlipidemia 11/07/2021   Hypoalbuminemia due to protein-calorie malnutrition ([place]) 11/07/2021   CAD (coronary artery disease) 11/07/2021   Hyperglycemia 11/07/2021   PD catheter dysfunction ([place]) 11/07/2021   Sessile serrated polyp of colon 06/06/2021   History of colonic polyps 06/06/2021   Retinal hemorrhage of right eye 10/28/2019   Left epiretinal membrane 10/28/2019   Controlled diabetes mellitus with moderate nonproliferative retinopathy of left eye, with long-term current use of insulin ([place]) 10/28/2019   Controlled type 2 diabetes mellitus with moderate nonproliferative retinopathy of right eye ([place]) 10/28/2019   Chronic  diarrhea 10/27/2019   Nausea with vomiting 10/27/2018   IBIDS syndrome 10/27/2018   Numbness of left hand 12/18/2016   Numbness 06/15/2016   Chronic migraine 04/04/2016   Neck pain  04/04/2016   Other headache syndrome 04/04/2016   Gait disturbance 04/04/2016   Diabetic neuropathy ([place]) 04/04/2016   Subacromial bursitis of left shoulder joint 04/04/2016   Depression 07/20/2014   Syncope 06/29/2014   Morbid obesity ([place]) 06/29/2014   Normal coronary arteries 2003 and 05/26/14 05/27/2014   Family history of coronary artery disease 05/27/2014   H/O renal cell cancer-s/p nephrectomy 05/27/2014   Abnormal stress test (false positive) 05/26/2014   Chronic renal insufficiency 05/26/2014   ALLERGIC RHINITIS 03/09/2010   Dyslipidemia 01/10/2010   Obstructive sleep apnea 01/10/2010   Essential hypertension 01/10/2010   PCP:  [name], MD Pharmacy:   [place], [place] - [id number] [place] [address] Phone: [phone] Fax: [phone]     Social Determinants of Health (SDOH) Interventions    Readmission Risk Interventions     No data to display

## 2021-11-07 NOTE — Procedures (Signed)
PD note:  Patient was comfortable when the procedure to drain her PD catheter began.  This writer explained the procedure and that the goal was to flush the catheter to ensure it was working. The patient and family stated understanding and that they had no questions.  Education was completed with the patient and the family about the need to ensure that aseptic technique was maintained anytime the catheter was being accessed.  All stated understanding and wore masks and kept distance during the accessing times.  There was bloody discharge on the dressing to the tube site.  Site was cleaned with no current discharge apparent.  The site was without swelling or redness.  A clean dressing was applied.  The patient stated that there was 1000 ml in the catheter dwelling because nothing would drain with last attempt.  Once all connected, the bag was set to drain and no effluent drained.   100 ml was drained into the catheter without difficulty to determine if it would take fluid.  It drained into the catheter without issue. There was no corresponding drainage out of the catheter at all. The patient repositioned in the bed with no resulting drainage.  The patient did complain of pain in her abdomen and lower pelvis while moving around in the bed.  She stated she is in no pain when on her back.

## 2021-11-07 NOTE — Progress Notes (Signed)
Discharge instructions given to pt. Pt verbalized understanding of all teaching and had no further questions. Patient discharged to home with husband with all belongings ( clothing, glasses)

## 2021-11-07 NOTE — Consult Note (Signed)
Reason for Consult: To manage dialysis and dialysis related needs Referring Physician: Dr Suan Halter is an 76 y.o. female.   HPI: Pt is a 29F with ESRD on HD, PD catheter just placed, DM II, HTN who is now seen in consultation at the request of Dr Wyline Copas for evaluation and recommendations surrounding PD catheter malfunction.    Pt had PD catheter placed 10/26/21 with Dr Raul Del.  Did well in the postoperative period.  Has been going to OP dialysis for flushes.  Had a flush on Monday but wouldn't drain per pt. Put anticoagulant in it and let it dwell for 1 hr but still unsuccessful.  Then got an overnight anticoagulant in catheter and was told to come back the next day for attempt at flushing/ draining.  That night (last night) she felt a sharp pain and noticed blood on her sheets.  W[facility] to [facility] ED.  Was recommended to present here for eval.  Hgb was 13 and CT scan showed a small hematoma in the [place] tissue.    Bleeding has stopped now.  Otherwise feels fine.  No f/c, n/v, SOB, CP, LE edema.  Has appt with Dr Raul Del 9/28.    Dialyzes at [facility] MWF HD  Past Medical History:  Diagnosis Date   Anemia    Anxiety    Cervical cancer ([place])    Chronic bronchitis ([place])    "get it q yr"   Chronic lower back pain    Depression    Febrile seizure ([place])    "as a child"   Fibromyalgia    GERD (gastroesophageal reflux disease)    Gout    History of blood transfusion    "S/P tonsillectomy"   History of hiatal hernia    Hx of cardiovascular stress test 05/14/2014   false positive Myoview   Hyperlipidemia    Hypertension    Migraine    "monthly" (05/26/2014)   Neuropathy    Osteoporosis    Parathyroid disease ([place])    "my PHT levels run high; I can't take calcium"   Pneumonia "several times"   Renal cancer ([place])    "right"-s/p nephrectomy   Renal insufficiency    "left kidney works at 40-60%" (05/26/2014)   Sleep apnea    "wore mask for 2 months; could not take it" (05/26/2014)   Type II  diabetes mellitus ([place])    Typhus fever    "as a child"    Past Surgical History:  Procedure Laterality Date   ABDOMINAL HERNIA REPAIR  02/2013   "in [place]"   [place]   "partial"   APPENDECTOMY     AV FISTULA PLACEMENT Left 06/13/2021   Procedure: LEFT ARM ARTERIOVENOUS FISTULA CREATION;  Surgeon: Rosetta Posner, MD;  Location: [facility] ORS;  Service: Vascular;  Laterality: Left;   BACK SURGERY     BILATERAL SALPINGOOPHORECTOMY Bilateral ~ [place]  2003, April 2016   normal coronaries   CATARACT EXTRACTION W/ INTRAOCULAR LENS  IMPLANT, BILATERAL  2015   COLONOSCOPY N/A 11/25/2014   Procedure: COLONOSCOPY;  Surgeon: Rogene Houston, MD;  Location: [facility] ENDO SUITE;  Service: Endoscopy;  Laterality: N/A;   CYSTOSCOPY W/ [place] MANIPULATION  1988   DIAGNOSTIC LAPAROSCOPY  1975   DILATION AND CURETTAGE OF UTERUS     ESOPHAGOGASTRODUODENOSCOPY N/A 11/25/2014   Procedure: ESOPHAGOGASTRODUODENOSCOPY (EGD);  Surgeon: Rogene Houston, MD;  Location: [facility] ENDO SUITE;  Service: Endoscopy;  Laterality: N/A;  9:30 - moved to 11:25 - [name] to notify pt   ESOPHAGOGASTRODUODENOSCOPY (EGD) [place]     GASTRIC BYPASS  2012   HERNIA REPAIR     2015   [place]   INSERTION OF DIALYSIS CATHETER Right 09/19/2021   Procedure: INSERTION OF TUNNELED DIALYSIS CATHETER;  Surgeon: Rosetta Posner, MD;  Location: [facility] ORS;  Service: Vascular;  Laterality: Right;   KNEE ARTHROSCOPY Left 2000's   LAPAROSCOPIC CHOLECYSTECTOMY  1990's   LEFT HEART CATHETERIZATION WITH CORONARY ANGIOGRAM N/A 05/27/2014   Procedure: LEFT HEART CATHETERIZATION WITH CORONARY ANGIOGRAM;  Surgeon: Lorretta Harp, MD;  Location: [facility] CATH LAB;  Service: Cardiovascular;  Laterality: N/A;   LIGATION OF ARTERIOVENOUS  FISTULA Left 09/19/2021   Procedure: LIGATION OF ARTERIOVENOUS  FISTULA;  Surgeon: Rosetta Posner, MD;  Location: [facility]  ORS;  Service: Vascular;  Laterality: Left;   [place] SURGERY  2001   "herniated discs"   [place]   "drilled into gum and put tooth implants uppers"   NEPHRECTOMY Right 2002   "cancer"   TMJ ARTHROPLASTY  1988   TONSILLECTOMY  1976   UPPER GI ENDOSCOPY      Family History  Problem Relation Age of Onset   Emphysema Father    Heart disease Father    Clotting disorder Mother    Diabetes Mother    Kidney disease Mother    Allergies Other        whole family per pt   Lung cancer Maternal Uncle    Alcohol abuse Maternal Uncle     Social History:  reports that she has never smoked. She has never been exposed to tobacco smoke. She has never used smokeless tobacco. She reports that she does not drink alcohol and does not use drugs.  Allergies:  Allergies  Allergen Reactions   Demerol [Meperidine] Shortness Of Breath    Stop breathing   Lyrica [Pregabalin] Swelling    Stomach lesion   Vicodin [Hydrocodone-Acetaminophen] Shortness Of Breath    Stop breathing   Ace Inhibitors Cough   Aleve [Naproxen] Other (See Comments)    stomach lesion   Codeine Other (See Comments)    hallucination, confused   Contrast Media [Iodinated Contrast Media] Nausea Only and Other (See Comments)    MRI dye. Shaky, nausea   Fentanyl Itching   Morphine Nausea And Vomiting   Neurontin [Gabapentin] Nausea Only, Swelling and Other (See Comments)    Confusion.   Norvasc [Amlodipine] Swelling   Nsaids Other (See Comments)    Severe chronic kidney disease   Other Other (See Comments)    Anesthesia--prolonged sleep inertia (grogginess/disorientation/cognitive impairment)   Dilaudid [Hydromorphone Hcl] Itching, Nausea And Vomiting and Rash    Medications: Scheduled:  feeding supplement  237 mL Oral BID BM    Results for orders placed or performed during the hospital encounter of 11/07/21 (from the past 48 hour(s))  CBC     Status: None   Collection Time: 11/06/21 11:50 PM  Result  Value Ref Range   WBC 7.7 4.0 - 10.5 K/uL   RBC 4.62 3.87 - 5.11 MIL/uL   Hemoglobin 13.0 12.0 - 15.0 g/dL   HCT 41.8 36.0 - 46.0 %   MCV 90.5 80.0 - 100.0 fL   MCH 28.1 26.0 - 34.0 pg   MCHC 31.1 30.0 - 36.0 g/dL   RDW 14.9 11.5 -  15.5 %   Platelets 221 150 - 400 K/uL   nRBC 0.0 0.0 - 0.2 %    Comment: Performed at [facility], [address] [place], [place] [address]  Comprehensive metabolic panel     Status: Abnormal   Collection Time: 11/06/21 11:50 PM  Result Value Ref Range   Sodium 135 135 - 145 mmol/L   Potassium 3.6 3.5 - 5.1 mmol/L   Chloride 101 98 - 111 mmol/L   CO2 25 22 - 32 mmol/L   Glucose, Bld 147 (H) 70 - 99 mg/dL    Comment: Glucose reference range applies only to samples taken after fasting for at least 8 hours.   BUN 14 8 - 23 mg/dL   Creatinine, Ser 2.91 (H) 0.44 - 1.00 mg/dL   Calcium 9.2 8.9 - 10.3 mg/dL   Total Protein 6.4 (L) 6.5 - 8.1 g/dL   Albumin 3.3 (L) 3.5 - 5.0 g/dL   AST 22 15 - 41 U/L   ALT 13 0 - 44 U/L   Alkaline Phosphatase 72 38 - 126 U/L   Total Bilirubin 0.7 0.3 - 1.2 mg/dL   GFR, Estimated 16 (L) >60 mL/min    Comment: (NOTE) Calculated using the CKD-EPI Creatinine Equation (2021)    Anion gap 9 5 - 15    Comment: Performed at [facility], [address], [place], [place] [address]  Lipase, blood     Status: None   Collection Time: 11/06/21 11:50 PM  Result Value Ref Range   Lipase 34 11 - 51 U/L    Comment: Performed at [facility], [address] [place], [place] [address]  CBC     Status: Abnormal   Collection Time: 11/07/21 12:19 PM  Result Value Ref Range   WBC 5.7 4.0 - 10.5 K/uL   RBC 4.13 3.87 - 5.11 MIL/uL   Hemoglobin 11.7 (L) 12.0 - 15.0 g/dL   HCT 36.3 36.0 - 46.0 %   MCV 87.9 80.0 - 100.0 fL   MCH 28.3 26.0 - 34.0 pg   MCHC 32.2 30.0 - 36.0 g/dL   RDW 14.9 11.5 - 15.5 %   Platelets 187 150 - 400 K/uL   nRBC 0.0 0.0 - 0.2 %    Comment: Performed at [facility], [place] [address], [place], [place]  [address]    CT ABDOMEN PELVIS WO CONTRAST  Result Date: 11/06/2021 CLINICAL DATA:  Postop abdominal pain; peritoneal dialysis catheter placed 10/26/2021 EXAM: CT ABDOMEN AND PELVIS WITHOUT CONTRAST TECHNIQUE: Multidetector CT imaging of the abdomen and pelvis was performed following the standard protocol without IV contrast. RADIATION DOSE REDUCTION: This exam was performed according to the departmental dose-optimization program which includes automated exposure control, adjustment of the mA and/or kV according to patient size and/or use of iterative reconstruction technique. COMPARISON:  CT abdomen and pelvis 05/06/2020 FINDINGS: Lower chest: Hemodialysis catheter tip in the right atrium. No acute abnormality. Hepatobiliary: Cholecystectomy. No biliary dilation. No suspicious focal hepatic lesion. Pancreas: Unremarkable. No pancreatic ductal dilatation or surrounding inflammatory changes. Spleen: Normal in size without focal abnormality. Adrenals/Urinary Tract: Unremarkable adrenal glands. Right nephrectomy. Left cortical renal scarring and multiple cysts. These have been previously evaluated with MRI 01/06/2019 and appear relatively stable since CT 05/06/2020. No hydronephrosis or urinary calculi. Nondistended bladder. Stomach/Bowel: Normal caliber large and small bowel. Postoperative changes of gastric bypass. Vascular/Lymphatic: Aortic atherosclerosis. No enlarged abdominal or pelvic lymph nodes. Reproductive: Hysterectomy.  No adnexal mass. Other: Anterior approach peritoneal dialysis  catheter with tip in the left pelvis. Small amount of soft tissue thickening/fluid along the catheter within the subcutaneous ventral abdominal wall fat. Free intraperitoneal fluid and gas likely related to peritoneal dialysis. Musculoskeletal: Progressed degenerative disc disease with large Schmorl's node at L4-L5. No fracture. Unchanged approximately 7 cm area of stranding in the anterior abdominal wall (series 2/image 52).  IMPRESSION: Interval placement of a peritoneal dialysis catheter on 10/26/2021. Small amount of fluid within the subcutaneous ventral abdominal wall fat along the catheter track is likely a small post procedural hematoma. Small volume free intraperitoneal air and fluid likely related to peritoneal dialysis. Stranding in the midline anterior abdominal wall fat is unchanged from 04/23/2020. Aortic Atherosclerosis (ICD10-I70.0). Electronically Signed   By: Placido Sou M.D.   On: 11/06/2021 23:53    ROS: all other systems reviewed and are negative except as per HPI Blood pressure 129/71, pulse (!) 53, temperature 98.1 F (36.7 C), temperature source Oral, resp. rate 17, height '5\' 2"'$  (1.575 m), weight 70.3 kg, SpO2 98 %. GEN  NAD, appears well HEENT EOMI PERRL NECK no JVD PULM clear bilaterally CV RRR ABD soft, slightly tender, island dressing with some red blood on it but not saturated.  Exit site looks ok without exposed cuff EXT no LE edema NEURO AAO x 3 nonfocal ACCESS R IJ TDC, PD cath in place  Assessment/Plan: 1 Bleeding from PD cath- could have gotten tugged in her sleep, cuasing blood.  Doesn't look like hemoperitoneum on CT scan and Hgb stable.  Low suspicion for PD pertonitis but will send cell ct and culture to be sure.  I think we can see if flush will work here today as this was the OP plan-- to see if flush would work.  Can be seen as an OP with Dr Raul Del on 9/28 regardless.  Will do PM CBC for completeness 2 ESRD: MWF HD.  Can have HD as OP tomorrow 3 Hypertension: reasonable control 4. Anemia of ESRD:  Hgb 13--> as above in #1 5. Metabolic Bone Disease:  binders and vitamins when eating 6.  Dispo: as long as CBC stable, OK to d/c from renal perspective to OP appoitnments  Torrence Hammack 11/07/2021, 12:39 PM

## 2021-11-07 NOTE — H&P (Signed)
History and Physical    Patient: Margaret Huerta:[id number] DOB: Dec 09, 1945 DOA: 11/07/2021 DOS: the patient was seen and examined on 11/07/2021 PCP: [name], MD  Patient coming from: Home  Chief Complaint:  Chief Complaint  Patient presents with   Post-op Problem   HPI: Margaret Huerta is a 76 y.o. female with medical history significant of ESRD on dialysis, essential hypertension, hyperlipidemia who presents to the emergency department due to abdominal pain with some bleeding at the peritoneal dialysis catheter insertion site.  Patient states that a new peritoneal dialysis catheter was placed on 9/14, she went to dialysis yesterday and after instilling 1 L of fluid to flush the catheter, it was difficult to aspirate the fluid, so an anticoagulant was applied and she was asked to return today (9/26) to reattempt the aspiration.  Last night, she complained of a sudden onset of lower abdominal pain as well as pain at the catheter insertion site which was followed with gushing of blood from the same area, so she presented to the ED earlier this morning for further evaluation and management.  ED Course:  In the emergency department, BP was 152/70 and patient was intermittently bradycardic, but other vital signs were within normal range.  Work-up in the ED showed normal CBC and BMP except for glucose of 147 and creatinine of 2.91.  Albumin 3.3. CT abdomen and pelvis without contrast showed: Interval placement of a peritoneal dialysis catheter on 10/26/2021. Small amount of fluid within the subcutaneous ventral abdominal wall fat along the catheter track is likely a small post procedural hematoma.  Small volume free intraperitoneal air and fluid likely related to peritoneal dialysis.  Stranding in the midline anterior abdominal wall fat is unchanged from 04/23/2020. Nephrologist (Dr. Royce Macadamia) was consulted and recommended admitting patient to [facility] with plan to follow-up with patient on  arrival to [facility]  Review of Systems: Review of systems as noted in the HPI. All other systems reviewed and are negative.   Past Medical History:  Diagnosis Date   Anemia    Anxiety    Cervical cancer ([place])    Chronic bronchitis ([place])    "get it q yr"   Chronic lower back pain    Depression    Febrile seizure ([place])    "as a child"   Fibromyalgia    GERD (gastroesophageal reflux disease)    Gout    History of blood transfusion    "S/P tonsillectomy"   History of hiatal hernia    Hx of cardiovascular stress test 05/14/2014   false positive Myoview   Hyperlipidemia    Hypertension    Migraine    "monthly" (05/26/2014)   Neuropathy    Osteoporosis    Parathyroid disease ([place])    "my PHT levels run high; I can't take calcium"   Pneumonia "several times"   Renal cancer ([place])    "right"-s/p nephrectomy   Renal insufficiency    "left kidney works at 40-60%" (05/26/2014)   Sleep apnea    "wore mask for 2 months; could not take it" (05/26/2014)   Type II diabetes mellitus ([place])    Typhus fever    "as a child"   Past Surgical History:  Procedure Laterality Date   ABDOMINAL HERNIA REPAIR  02/2013   "in [place]"   [place]   "partial"   APPENDECTOMY     AV FISTULA PLACEMENT Left 06/13/2021   Procedure: LEFT ARM ARTERIOVENOUS FISTULA CREATION;  Surgeon: Curt Jews  F, MD;  Location: [facility] ORS;  Service: Vascular;  Laterality: Left;   BACK SURGERY     BILATERAL SALPINGOOPHORECTOMY Bilateral ~ [place]  2003, April 2016   normal coronaries   CATARACT EXTRACTION W/ INTRAOCULAR LENS  IMPLANT, BILATERAL  2015   COLONOSCOPY N/A 11/25/2014   Procedure: COLONOSCOPY;  Surgeon: Rogene Houston, MD;  Location: [facility] ENDO SUITE;  Service: Endoscopy;  Laterality: N/A;   CYSTOSCOPY W/ [place] MANIPULATION  1988   DIAGNOSTIC LAPAROSCOPY  1975   DILATION AND CURETTAGE OF UTERUS     ESOPHAGOGASTRODUODENOSCOPY N/A 11/25/2014   Procedure:  ESOPHAGOGASTRODUODENOSCOPY (EGD);  Surgeon: Rogene Houston, MD;  Location: [facility] ENDO SUITE;  Service: Endoscopy;  Laterality: N/A;  9:30 - moved to 11:25 - [name] to notify pt   ESOPHAGOGASTRODUODENOSCOPY (EGD) [place]     GASTRIC BYPASS  2012   HERNIA REPAIR     2015   [place]   INSERTION OF DIALYSIS CATHETER Right 09/19/2021   Procedure: INSERTION OF TUNNELED DIALYSIS CATHETER;  Surgeon: Rosetta Posner, MD;  Location: [facility] ORS;  Service: Vascular;  Laterality: Right;   KNEE ARTHROSCOPY Left 2000's   LAPAROSCOPIC CHOLECYSTECTOMY  1990's   LEFT HEART CATHETERIZATION WITH CORONARY ANGIOGRAM N/A 05/27/2014   Procedure: LEFT HEART CATHETERIZATION WITH CORONARY ANGIOGRAM;  Surgeon: Lorretta Harp, MD;  Location: [facility] CATH LAB;  Service: Cardiovascular;  Laterality: N/A;   LIGATION OF ARTERIOVENOUS  FISTULA Left 09/19/2021   Procedure: LIGATION OF ARTERIOVENOUS  FISTULA;  Surgeon: Rosetta Posner, MD;  Location: [facility] ORS;  Service: Vascular;  Laterality: Left;   [place] SURGERY  2001   "herniated discs"   [place]   "drilled into gum and put tooth implants uppers"   NEPHRECTOMY Right 2002   "cancer"   [facility] ENDOSCOPY      Social History:  reports that she has never smoked. She has never been exposed to tobacco smoke. She has never used smokeless tobacco. She reports that she does not drink alcohol and does not use drugs.   Allergies  Allergen Reactions   Demerol [Meperidine] Shortness Of Breath    Stop breathing   Lyrica [Pregabalin] Swelling    Stomach lesion   Vicodin [Hydrocodone-Acetaminophen] Shortness Of Breath    Stop breathing   Ace Inhibitors Cough   Aleve [Naproxen] Other (See Comments)    stomach lesion   Codeine Other (See Comments)    hallucination, confused   Contrast Media [Iodinated Contrast Media] Nausea Only and Other (See  Comments)    MRI dye. Shaky, nausea   Fentanyl Itching   Morphine Nausea And Vomiting   Neurontin [Gabapentin] Nausea Only, Swelling and Other (See Comments)    Confusion.   Norvasc [Amlodipine] Swelling   Nsaids Other (See Comments)    Severe chronic kidney disease   Other Other (See Comments)    Anesthesia--prolonged sleep inertia (grogginess/disorientation/cognitive impairment)   Dilaudid [Hydromorphone Hcl] Itching, Nausea And Vomiting and Rash    Family History  Problem Relation Age of Onset   Emphysema Father    Heart disease Father    Clotting disorder Mother    Diabetes Mother    Kidney disease Mother    Allergies Other        whole family per pt   Lung cancer  Maternal Uncle    Alcohol abuse Maternal Uncle      Prior to Admission medications   Medication Sig Start Date End Date Taking? Authorizing Provider  acetaminophen (TYLENOL) 500 MG tablet Take 500 mg by mouth every 6 (six) hours as needed (for pain.).    [provider]  allopurinol (ZYLOPRIM) 300 MG tablet Take 300 mg by mouth in the morning. 05/09/17   [provider]  ALPRAZolam Duanne Moron) 0.25 MG tablet Take 0.25 mg by mouth 2 (two) times daily as needed for anxiety. 05/23/21   [provider]  aspirin EC 81 MG tablet Take 81 mg by mouth in the morning. Swallow whole.    [provider]  atenolol (TENORMIN) 50 MG tablet Take 50 mg by mouth 2 (two) times daily.    [provider]  calcitRIOL (ROCALTROL) 0.25 MCG capsule Take 0.25 mcg by mouth in the morning.    [provider]  Cholecalciferol (VITAMIN D3) 250 MCG (10000 UT) TABS Take 10,000 Units by mouth in the morning.    [provider]  docusate sodium (COLACE) 50 MG capsule Take 50 mg by mouth at bedtime.    [provider]  DULoxetine (CYMBALTA) 60 MG capsule Take 60 mg by mouth in the morning.    [provider]  furosemide (LASIX) 40 MG tablet Take 40 mg by mouth See admin  instructions. Take 2 tablets (80 mg) by mouth in the morning & take 1 tablet (40 mg) by mouth at night.    [provider]  hydrALAZINE (APRESOLINE) 50 MG tablet Take 100 mg by mouth in the morning, at noon, and at bedtime. 02/28/21   [provider]  isosorbide dinitrate (ISORDIL) 10 MG tablet Take 1 tablet (10 mg total) by mouth 2 (two) times daily. 07/21/21   Arnoldo Lenis, MD  isosorbide dinitrate (ISORDIL) 10 MG tablet Take 1 tablet by mouth 2 (two) times daily.    [provider]  NITROSTAT 0.4 MG SL tablet Place 1 tablet (0.4 mg total) under the tongue as needed. 07/21/21   Arnoldo Lenis, MD  rosuvastatin (CRESTOR) 10 MG tablet Take 1 tablet (10 mg total) by mouth daily. 12/01/19   Verta Ellen., NP  sevelamer carbonate (RENVELA) 800 MG tablet Take 800 mg by mouth 3 (three) times daily. 09/13/21   [provider]  sodium bicarbonate 650 MG tablet Take 650 mg by mouth 2 (two) times daily. 04/15/21   [provider]    Physical Exam: BP (!) 148/80 (BP Location: Left Arm)   Pulse (!) 57   Temp 98.2 F (36.8 C) (Oral)   Resp 16   Ht '5\' 2"'$  (1.575 m)   Wt 70.3 kg   SpO2 100%   BMI 28.35 kg/m   General: 77 y.o. year-old female well developed well nourished in no acute distress.  Alert and oriented x3. HEENT: NCAT, EOMI Neck: Supple, trachea medial Cardiovascular: Regular rate and rhythm with no rubs or gallops.  No thyromegaly or JVD noted.  No lower extremity edema. 2/4 pulses in all 4 extremities. Respiratory: Clear to auscultation with no wheezes or rales. Good inspiratory effort. Abdomen: Soft, tender to palpation of lower quadrants, noted PD catheter at the insertion site.  Nondistended with normal bowel sounds x4 quadrants. Muskuloskeletal:  Port-A-Cath noted at right chest area.No cyanosis or clubbing noted bilaterally Neuro: CN II-XII intact, strength 5/5 x 4, sensation, reflexes intact Skin: No ulcerative lesions noted or  rashes  Psychiatry: Judgement and insight appear normal. Mood is appropriate for condition and setting          Labs on Admission:  Basic Metabolic Panel: Recent Labs  Lab 11/06/21 2350  NA 135  K 3.6  CL 101  CO2 25  GLUCOSE 147*  BUN 14  CREATININE 2.91*  CALCIUM 9.2   Liver Function Tests: Recent Labs  Lab 11/06/21 2350  AST 22  ALT 13  ALKPHOS 72  BILITOT 0.7  PROT 6.4*  ALBUMIN 3.3*   Recent Labs  Lab 11/06/21 2350  LIPASE 34   No results for input(s): "AMMONIA" in the last 168 hours. CBC: Recent Labs  Lab 11/06/21 2350  WBC 7.7  HGB 13.0  HCT 41.8  MCV 90.5  PLT 221   Cardiac Enzymes: No results for input(s): "CKTOTAL", "CKMB", "CKMBINDEX", "TROPONINI" in the last 168 hours.  BNP (last 3 results) No results for input(s): "BNP" in the last 8760 hours.  ProBNP (last 3 results) No results for input(s): "PROBNP" in the last 8760 hours.  CBG: No results for input(s): "GLUCAP" in the last 168 hours.  Radiological Exams on Admission: CT ABDOMEN PELVIS WO CONTRAST  Result Date: 11/06/2021 CLINICAL DATA:  Postop abdominal pain; peritoneal dialysis catheter placed 10/26/2021 EXAM: CT ABDOMEN AND PELVIS WITHOUT CONTRAST TECHNIQUE: Multidetector CT imaging of the abdomen and pelvis was performed following the standard protocol without IV contrast. RADIATION DOSE REDUCTION: This exam was performed according to the departmental dose-optimization program which includes automated exposure control, adjustment of the mA and/or kV according to patient size and/or use of iterative reconstruction technique. COMPARISON:  CT abdomen and pelvis 05/06/2020 FINDINGS: Lower chest: Hemodialysis catheter tip in the right atrium. No acute abnormality. Hepatobiliary: Cholecystectomy. No biliary dilation. No suspicious focal hepatic lesion. Pancreas: Unremarkable. No pancreatic ductal dilatation or surrounding inflammatory changes. Spleen: Normal in size without focal abnormality.  Adrenals/Urinary Tract: Unremarkable adrenal glands. Right nephrectomy. Left cortical renal scarring and multiple cysts. These have been previously evaluated with MRI 01/06/2019 and appear relatively stable since CT 05/06/2020. No hydronephrosis or urinary calculi. Nondistended bladder. Stomach/Bowel: Normal caliber large and small bowel. Postoperative changes of gastric bypass. Vascular/Lymphatic: Aortic atherosclerosis. No enlarged abdominal or pelvic lymph nodes. Reproductive: Hysterectomy.  No adnexal mass. Other: Anterior approach peritoneal dialysis catheter with tip in the left pelvis. Small amount of soft tissue thickening/fluid along the catheter within the subcutaneous ventral abdominal wall fat. Free intraperitoneal fluid and gas likely related to peritoneal dialysis. Musculoskeletal: Progressed degenerative disc disease with large Schmorl's node at L4-L5. No fracture. Unchanged approximately 7 cm area of stranding in the anterior abdominal wall (series 2/image 52). IMPRESSION: Interval placement of a peritoneal dialysis catheter on 10/26/2021. Small amount of fluid within the subcutaneous ventral abdominal wall fat along the catheter track is likely a small post procedural hematoma. Small volume free intraperitoneal air and fluid likely related to peritoneal dialysis. Stranding in the midline anterior abdominal wall fat is unchanged from 04/23/2020. Aortic Atherosclerosis (ICD10-I70.0). Electronically Signed   By: Placido Sou M.D.   On: 11/06/2021 23:53    EKG: I independently viewed the EKG done and my findings are as followed: EKG was not done in the ED  Assessment/Plan Present on Admission:  Disorder of peritoneal dialysis catheter  Essential hypertension  Principal Problem:   Disorder of peritoneal dialysis catheter Active Problems:   Essential hypertension   Mixed hyperlipidemia   Hypoalbuminemia due to protein-calorie malnutrition ([place])   CAD (coronary artery disease)  Hyperglycemia  Disorder of peritoneal dialysis catheter ESRD on HD Nephrologist (Dr. Royce Macadamia) was already consulted at [facility] and recommends admitting the patient to [facility] with plan to follow-up with patient on arrival per ED physician Continue maintenance dialysis Continue Renvela and sodium bicarbonate  Hypoalbuminemia possibly secondary to mild protein calorie malnutrition Albumin 3.3, protein supplement to be provided  Hyperglycemia possibly reactive CBG 147, continue to monitor blood glucose level  Essential hypertension Continue atenolol, hydralazine, Isordil  Mixed hyperlipidemia Continue Crestor  CAD Continue aspirin, Crestor, Isordil, nitroglycerin as needed  DVT prophylaxis: SCDs  Code Status: Full code  Consults: Nephrology (by ED physician)  Family Communication: None at bedside  Severity of Illness: The appropriate patient status for this patient is INPATIENT. Inpatient status is judged to be reasonable and necessary in order to provide the required intensity of service to ensure the patient's safety. The patient's presenting symptoms, physical exam findings, and initial radiographic and laboratory data in the context of their chronic comorbidities is felt to place them at high risk for further clinical deterioration. Furthermore, it is not anticipated that the patient will be medically stable for discharge from the hospital within 2 midnights of admission.   * I certify that at the point of admission it is my clinical judgment that the patient will require inpatient hospital care spanning beyond 2 midnights from the point of admission due to high intensity of service, high risk for further deterioration and high frequency of surveillance required.*  Author: Bernadette Hoit, DO 11/07/2021 6:32 AM  For on call review [email or web address].

## 2021-11-07 NOTE — ED Provider Notes (Signed)
[facility] Emergency Department Provider Note MRN:  [id number]  Arrival date & time: 11/07/21     Chief Complaint   Post-op Problem   History of Present Illness   Margaret Huerta is a 76 y.o. year-old female with a history of ESRD presenting to the ED with chief complaint of postop problem.  New peritoneal dialysis catheter placed on September 14, having increased abdominal pain this evening, small amount of blood coming from the new catheter site.  Also the catheter stopped working today.  Was having some problems flushing, yesterday morning at the dialysis center they were unable to take out the dialysis fluid.  Review of Systems  A thorough review of systems was obtained and all systems are negative except as noted in the HPI and PMH.   Patient's Health History    Past Medical History:  Diagnosis Date   Anemia    Anxiety    Cervical cancer ([place])    Chronic bronchitis ([place])    "get it q yr"   Chronic lower back pain    Depression    Febrile seizure ([place])    "as a child"   Fibromyalgia    GERD (gastroesophageal reflux disease)    Gout    History of blood transfusion    "S/P tonsillectomy"   History of hiatal hernia    Hx of cardiovascular stress test 05/14/2014   false positive Myoview   Hyperlipidemia    Hypertension    Migraine    "monthly" (05/26/2014)   Neuropathy    Osteoporosis    Parathyroid disease ([place])    "my PHT levels run high; I can't take calcium"   Pneumonia "several times"   Renal cancer ([place])    "right"-s/p nephrectomy   Renal insufficiency    "left kidney works at 40-60%" (05/26/2014)   Sleep apnea    "wore mask for 2 months; could not take it" (05/26/2014)   Type II diabetes mellitus ([place])    Typhus fever    "as a child"    Past Surgical History:  Procedure Laterality Date   ABDOMINAL HERNIA REPAIR  02/2013   "in [place]"   [place]   "partial"   APPENDECTOMY     AV FISTULA PLACEMENT Left  06/13/2021   Procedure: LEFT ARM ARTERIOVENOUS FISTULA CREATION;  Surgeon: Rosetta Posner, MD;  Location: [facility] ORS;  Service: Vascular;  Laterality: Left;   BACK SURGERY     BILATERAL SALPINGOOPHORECTOMY Bilateral ~ [place]  2003, April 2016   normal coronaries   CATARACT EXTRACTION W/ INTRAOCULAR LENS  IMPLANT, BILATERAL  2015   COLONOSCOPY N/A 11/25/2014   Procedure: COLONOSCOPY;  Surgeon: Rogene Houston, MD;  Location: [facility] ENDO SUITE;  Service: Endoscopy;  Laterality: N/A;   CYSTOSCOPY W/ [place] MANIPULATION  1988   DIAGNOSTIC LAPAROSCOPY  1975   DILATION AND CURETTAGE OF UTERUS     ESOPHAGOGASTRODUODENOSCOPY N/A 11/25/2014   Procedure: ESOPHAGOGASTRODUODENOSCOPY (EGD);  Surgeon: Rogene Houston, MD;  Location: [facility] END[facility] SU[facility];  Service: Endoscopy;  Laterality: N/A;  9:30 - moved to 11:25 - [name] to notify pt   ESOPHAGOGASTRODUODENOSCOPY (EGD) [place]     GASTRIC BYPASS  2012   HERNIA REPAIR     2015   [place]   INSERTION OF DIALYSIS CATHETER Right 09/19/2021   Procedure: INSERTION OF TUNNELED DIALYSIS CATHETER;  Surgeon: Rosetta Posner, MD;  Location: [facility] ORS;  Service: Vascular;  Laterality: Right;   KNEE ARTHROSCOPY Left 2000's   LAPAROSCOPIC CHOLECYSTECTOMY  1990's   LEFT HEART CATHETERIZATION WITH CORONARY ANGIOGRAM N/A 05/27/2014   Procedure: LEFT HEART CATHETERIZATION WITH CORONARY ANGIOGRAM;  Surgeon: Lorretta Harp, MD;  Location: [facility] CATH LAB;  Service: Cardiovascular;  Laterality: N/A;   LIGATION OF ARTERIOVENOUS  FISTULA Left 09/19/2021   Procedure: LIGATION OF ARTERIOVENOUS  FISTULA;  Surgeon: Rosetta Posner, MD;  Location: [facility] OR[facility];  Service: Vascular;  Laterality: Left;   [place] SURGERY  2001   "herniated discs"   [place]   "drilled into gum and put tooth implants uppers"   NEPHRECTOMY Right 2002   "cancer"   TMJ ARTHROPLASTY  1988   TONSILLECTOMY  1976    UPPER GI ENDOSCOPY      Family History  Problem Relation Age of Onset   Emphysema Father    Heart disease Father    Clotting disorder Mother    Diabetes Mother    Kidney disease Mother    Allergies Other        whole family per pt   Lung cancer Maternal Uncle    Alcohol abuse Maternal Uncle     Social History   Socioeconomic History   Marital status: Married    Spouse name: Faelyn Sigler   Number of children: 1   Years of education: 12   Highest education level: 12th grade  Occupational History   Occupation: Housewife  Tobacco Use   Smoking status: Never    Passive exposure: Never   Smokeless tobacco: Never  Vaping Use   Vaping Use: Never used  Substance and Sexual Activity   Alcohol use: No    Alcohol/week: 0.0 standard drinks of alcohol   Drug use: No   Sexual activity: Yes    Partners: Male    Birth control/protection: Surgical  Other Topics Concern   Not on file  Social History Narrative   Not on file   Social Determinants of Health   Financial Resource Strain: Low Risk  (09/18/2021)   Overall Financial Resource Strain (CARDIA)    Difficulty of Paying Living Expenses: Not hard at all  Food Insecurity: No Food Insecurity (09/18/2021)   Hunger Vital Sign    Worried About Running Out of Food in the Last Year: Never true    Ran Out of Food in the Last Year: Never true  Transportation Needs: No Transportation Needs (09/18/2021)   PRAPARE - Hydrologist (Medical): No    Lack of Transportation (Non-Medical): No  Physical Activity: Inactive (09/18/2021)   Exercise Vital Sign    Days of Exercise per Week: 0 days    Minutes of Exercise per Session: 0 min  Stress: No Stress Concern Present (09/18/2021)   [place]    Feeling of Stress : Only a little  Social Connections: Socially Integrated (09/18/2021)   Social Connection and Isolation Panel [NHANES]    Frequency of Communication  with Friends and Family: More than three times a week    Frequency of Social Gatherings with Friends and Family: More than three times a week    Attends Religious Services: More than 4 times per year    Active Member of [employer] or Organizations: Yes    Attends Archivist Meetings: 1 to 4 times per year    Marital Status:  Married  Intimate Partner Violence: Not At Risk (09/18/2021)   Humiliation, Afraid, Rape, and Kick questionnaire    Fear of Current or Ex-Partner: No    Emotionally Abused: No    Physically Abused: No    Sexually Abused: No     Physical Exam   Vitals:   11/06/21 2300 11/07/21 0243  BP: 117/88 (!) 152/70  Pulse: 60 70  Resp: 20 19  Temp: 97.7 F (36.5 C)   SpO2: 95% 99%    CONSTITUTIONAL: Well-appearing, NAD NEURO/PSYCH:  Alert and oriented x 3, no focal deficits EYES:  eyes equal and reactive ENT/NECK:  no LAD, no JVD CARDIO: Regular rate, well-perfused, normal S1 and S2 PULM:  CTAB no wheezing or rhonchi GI/GU:  non-distended, non-tender MSK/SPINE:  No gross deformities, no edema SKIN:  no rash, atraumatic   *Additional and/or pertinent findings included in MDM below  Diagnostic and Interventional Summary    EKG Interpretation  Date/Time:    Ventricular Rate:    PR Interval:    QRS Duration:   QT Interval:    QTC Calculation:   R Axis:     Text Interpretation:         Labs Reviewed  COMPREHENSIVE METABOLIC PANEL - Abnormal; Notable for the following components:      Result Value   Glucose, Bld 147 (*)    Creatinine, Ser 2.91 (*)    Total Protein 6.4 (*)    Albumin 3.3 (*)    GFR, Estimated 16 (*)    All other components within normal limits  CBC  LIPASE, BLOOD    CT ABDOMEN PELVIS WO CONTRAST  Final Result      Medications  acetaminophen (TYLENOL) tablet 1,000 mg (1,000 mg Oral Given 11/07/21 0343)     Procedures  /  Critical Care Procedures  ED Course and Medical Decision Making  Initial Impression and  Ddx Differential diagnosis includes postoperative bleeding, postoperative infection, clogged PD tube, damage to or kinked PD tube  Past medical/surgical history that increases complexity of ED encounter: ESRD  Interpretation of Diagnostics I personally reviewed the laboratory assessment and my interpretation is as follows: No significant blood count or electrolyte disturbance  CT imaging does not reveal any significant intra-abdominal pathology.  There is evidence of hematoma to the soft tissues and so I suspect this is where patient's pain and small amount of bleeding is coming from.  Patient Reassessment and Ultimate Disposition/Management     Case discussed with Dr. Royce Macadamia of nephrology, plan is for hospitalist admission at [facility] where her PD catheter can be evaluated.  Patient management required discussion with the following services or consulting groups:  Hospitalist Service and Nephrology  Complexity of Problems Addressed Acute illness or injury that poses threat of life of bodily function  Additional Data Reviewed and Analyzed Further history obtained from: Further history from spouse/family member  Additional Factors Impacting ED Encounter Risk Consideration of hospitalization  Barth Kirks. Sedonia Small, MD [place] [email or web address]  Final Clinical Impressions(s) / ED Diagnoses     ICD-10-CM   1. Peritoneal dialysis catheter dysfunction, initial encounter ([place])  T85.611A     2. Post-op pain  G89.18       ED Discharge Orders     None        Discharge Instructions Discussed with and Provided to Patient:   Discharge Instructions   None      Maudie Flakes, MD 11/07/21 [phone]

## 2021-11-07 NOTE — Discharge Summary (Signed)
Physician Discharge Summary   Patient: Margaret Huerta MRN: [id number] DOB: 04-11-1945  Admit date:     11/07/2021  Discharge date: 11/07/21  Discharge Physician: Marylu Lund   PCP: [name], MD   Recommendations at discharge:    Follow up with PCP in 1-2 weeks Follow up with scheduled outpateint hemodialysis Follow up wih Dr. Raul Del as scheduled  Discharge Diagnoses: Principal Problem:   Disorder of peritoneal dialysis catheter Active Problems:   Essential hypertension   Mixed hyperlipidemia   Hypoalbuminemia due to protein-calorie malnutrition ([place])   CAD (coronary artery disease)   Hyperglycemia   PD catheter dysfunction ([place])  Resolved Problems:   * No resolved hospital problems. *  Hospital Course: 76 y.o. female with medical history significant of ESRD on dialysis, essential hypertension, hyperlipidemia who presents to the emergency department due to abdominal pain with some bleeding at the peritoneal dialysis catheter insertion site.  Patient states that a new peritoneal dialysis catheter was placed on 9/14, she went to dialysis yesterday and after instilling 1 L of fluid to flush the catheter, it was difficult to aspirate the fluid, so an anticoagulant was applied and she was asked to return today (9/26) to reattempt the aspiration.  Last night, she complained of a sudden onset of lower abdominal pain as well as pain at the catheter insertion site which was followed with gushing of blood from the same area, so she presented to the ED earlier this morning for further evaluation and management  Assessment and Plan: Disorder of peritoneal dialysis catheter ESRD on HD At presentation, Nephrologist (Dr. Royce Macadamia) was consulted at [facility] and recommended admitting the patient to [place] and sodium bicarbonate Seen by Nephrology. Pt stable from nephro standpoint to follow up for scheduled outpatient HD 9/27 No leukocytosis Low suspicion for PD peritonitis,  however PD cell count and cultures drawn, to be f/u as outpatient   Hypoalbuminemia possibly secondary to mild protein calorie malnutrition Albumin 3.3, protein supplement to be provided Tolerated PO diet without issues   Hyperglycemia possibly reactive CBG 147, continue to monitor blood glucose level   Essential hypertension Continue atenolol, hydralazine, Isordil BP controlled   Mixed hyperlipidemia Continue Crestor   CAD Continue aspirin, Crestor, Isordil, nitroglycerin as needed       Consultants: Nephrology Procedures performed:   Disposition: Home Diet recommendation:  Renal diet DISCHARGE MEDICATION: Allergies as of 11/07/2021       Reactions   Demerol [meperidine] Shortness Of Breath   Stop breathing   Lyrica [pregabalin] Swelling   Stomach lesion   Vicodin [hydrocodone-acetaminophen] Shortness Of Breath   Stop breathing   Ace Inhibitors Cough   Aleve [naproxen] Other (See Comments)   stomach lesion   Codeine Other (See Comments)   hallucination, confused   Contrast Media [iodinated Contrast Media] Nausea Only, Other (See Comments)   MRI dye. Shaky, nausea   Fentanyl Itching   Morphine Nausea And Vomiting   Neurontin [gabapentin] Nausea Only, Swelling, Other (See Comments)   Confusion.   Norvasc [amlodipine] Swelling   Nsaids Other (See Comments)   Severe chronic kidney disease   Other Other (See Comments)   Anesthesia--prolonged sleep inertia (grogginess/disorientation/cognitive impairment)   Dilaudid [hydromorphone Hcl] Itching, Nausea And Vomiting, Rash        Medication List     TAKE these medications    acetaminophen 500 MG tablet Commonly known as: TYLENOL Take 500 mg by mouth every 6 (six) hours as needed (for pain.).  allopurinol 300 MG tablet Commonly known as: ZYLOPRIM Take 300 mg by mouth in the morning.   ALPRAZolam 0.25 MG tablet Commonly known as: XANAX Take 0.25 mg by mouth every Monday, Wednesday, and Friday with  hemodialysis.   aspirin EC 81 MG tablet Take 81 mg by mouth in the morning. Swallow whole.   atenolol 50 MG tablet Commonly known as: TENORMIN Take 50 mg by mouth 2 (two) times daily.   calcitRIOL 0.25 MCG capsule Commonly known as: ROCALTROL Take 0.25 mcg by mouth in the morning.   docusate sodium 50 MG capsule Commonly known as: COLACE Take 50 mg by mouth at bedtime.   DULoxetine 60 MG capsule Commonly known as: CYMBALTA Take 60 mg by mouth in the morning.   furosemide 40 MG tablet Commonly known as: LASIX Take 40 mg by mouth 2 (two) times daily.   hydrALAZINE 50 MG tablet Commonly known as: APRESOLINE Take 100 mg by mouth in the morning, at noon, and at bedtime.   isosorbide dinitrate 10 MG tablet Commonly known as: ISORDIL Take 1 tablet (10 mg total) by mouth 2 (two) times daily.   Nitrostat 0.4 MG SL tablet Generic drug: nitroGLYCERIN Place 1 tablet (0.4 mg total) under the tongue as needed.   rosuvastatin 10 MG tablet Commonly known as: CRESTOR Take 1 tablet (10 mg total) by mouth daily.   sevelamer carbonate 800 MG tablet Commonly known as: RENVELA Take 800 mg by mouth 3 (three) times daily.   sodium bicarbonate 650 MG tablet Take 650 mg by mouth 2 (two) times daily.   Vitamin D3 250 MCG (10000 UT) Tabs Take 10,000 Units by mouth in the morning.        Follow-up Information     [name], MD Follow up in 1 week(s).   Specialty: Internal Medicine Why: Hospital follow up Contact information: [facility] [place] [address] [phone]         Arnoldo Lenis, MD .   Specialty: Cardiology Contact information: [address] [place] [address] [phone]         Demetrius Revel, MD Follow up.   Specialty: Surgery Why: as already scheduled Contact information: [address] [place] [address] [phone]                Discharge Exam: Danley Danker Weights   11/06/21 2300 11/06/21  2301  Weight: 70.3 kg 70.3 kg   General exam: Awake, laying in bed, in nad Respiratory system: Normal respiratory effort, no wheezing Cardiovascular system: regular rate, s1, s2 Gastrointestinal system: Soft, nondistended, positive BS Central nervous system: CN2-12 grossly intact, strength intact Extremities: Perfused, no clubbing Skin: Normal skin turgor, no notable skin lesions seen Psychiatry: Mood normal // no visual hallucinations   Condition at discharge: fair  The results of significant diagnostics from this hospitalization (including imaging, microbiology, ancillary and laboratory) are listed below for reference.   Imaging Studies: CT ABDOMEN PELVIS WO CONTRAST  Result Date: 11/06/2021 CLINICAL DATA:  Postop abdominal pain; peritoneal dialysis catheter placed 10/26/2021 EXAM: CT ABDOMEN AND PELVIS WITHOUT CONTRAST TECHNIQUE: Multidetector CT imaging of the abdomen and pelvis was performed following the standard protocol without IV contrast. RADIATION DOSE REDUCTION: This exam was performed according to the departmental dose-optimization program which includes automated exposure control, adjustment of the mA and/or kV according to patient size and/or use of iterative reconstruction technique. COMPARISON:  CT abdomen and pelvis 05/06/2020 FINDINGS: Lower chest: Hemodialysis catheter tip in the  right atrium. No acute abnormality. Hepatobiliary: Cholecystectomy. No biliary dilation. No suspicious focal hepatic lesion. Pancreas: Unremarkable. No pancreatic ductal dilatation or surrounding inflammatory changes. Spleen: Normal in size without focal abnormality. Adrenals/Urinary Tract: Unremarkable adrenal glands. Right nephrectomy. Left cortical renal scarring and multiple cysts. These have been previously evaluated with MRI 01/06/2019 and appear relatively stable since CT 05/06/2020. No hydronephrosis or urinary calculi. Nondistended bladder. Stomach/Bowel: Normal caliber large and small bowel.  Postoperative changes of gastric bypass. Vascular/Lymphatic: Aortic atherosclerosis. No enlarged abdominal or pelvic lymph nodes. Reproductive: Hysterectomy.  No adnexal mass. Other: Anterior approach peritoneal dialysis catheter with tip in the left pelvis. Small amount of soft tissue thickening/fluid along the catheter within the subcutaneous ventral abdominal wall fat. Free intraperitoneal fluid and gas likely related to peritoneal dialysis. Musculoskeletal: Progressed degenerative disc disease with large Schmorl's node at L4-L5. No fracture. Unchanged approximately 7 cm area of stranding in the anterior abdominal wall (series 2/image 52). IMPRESSION: Interval placement of a peritoneal dialysis catheter on 10/26/2021. Small amount of fluid within the subcutaneous ventral abdominal wall fat along the catheter track is likely a small post procedural hematoma. Small volume free intraperitoneal air and fluid likely related to peritoneal dialysis. Stranding in the midline anterior abdominal wall fat is unchanged from 04/23/2020. Aortic Atherosclerosis (ICD10-I70.0). Electronically Signed   By: Placido Sou M.D.   On: 11/06/2021 23:53    Microbiology: Results for orders placed or performed in visit on 08/09/20  Microscopic Examination     Status: Abnormal   Collection Time: 08/09/20  1:11 PM   Urine  Result Value Ref Range Status   WBC, UA >30 (A) 0 - 5 /hpf Final   RBC, Urine 3-10 (A) 0 - 2 /hpf Final   Epithelial Cells (non renal) 0-10 0 - 10 /hpf Final   Renal Epithel, UA None seen None seen /hpf Final   Crystals Present N/A Final   Crystal Type Calcium Oxalate N/A Final   Mucus, UA Present Not Estab. Final   Bacteria, UA Many (A) None seen/Few Final  Urine Culture     Status: Abnormal   Collection Time: 08/09/20  3:21 PM   Specimen: Urine   Urine  Result Value Ref Range Status   Urine Culture, Routine Final report (A)  Final   Organism ID, Bacteria Comment (A)  Final    Comment:  Escherichia coli, identified by an automated biochemical system. Cefazolin <=4 ug/mL Cefazolin with an MIC <=16 predicts susceptibility to the oral agents cefaclor, cefdinir, cefpodoxime, cefprozil, cefuroxime, cephalexin, and loracarbef when used for therapy of uncomplicated urinary tract infections due to E. coli, Klebsiella pneumoniae, and Proteus mirabilis. Greater than 100,000 colony forming units per mL    Antimicrobial Susceptibility Comment  Final    Comment:       ** S = Susceptible; I = Intermediate; R = Resistant **                    P = Positive; N = Negative             MICS are expressed in micrograms per mL    Antibiotic                 RSLT#1    RSLT#2    RSLT#3    RSLT#4 Amoxicillin/Clavulanic Acid    S Ampicillin                     S Cefepime  S Ceftriaxone                    S Cefuroxime                     S Ciprofloxacin                  S Ertapenem                      S Gentamicin                     S Imipenem                       S Levofloxacin                   S Meropenem                      S Nitrofurantoin                 S Piperacillin/Tazobactam        S Tetracycline                   S Tobramycin                     S Trimethoprim/Sulfa             S     Labs: CBC: Recent Labs  Lab 11/06/21 2350 11/07/21 1219  WBC 7.7 5.7  HGB 13.0 11.7*  HCT 41.8 36.3  MCV 90.5 87.9  PLT 221 182   Basic Metabolic Panel: Recent Labs  Lab 11/06/21 2350  NA 135  K 3.6  CL 101  CO2 25  GLUCOSE 147*  BUN 14  CREATININE 2.91*  CALCIUM 9.2   Liver Function Tests: Recent Labs  Lab 11/06/21 2350  AST 22  ALT 13  ALKPHOS 72  BILITOT 0.7  PROT 6.4*  ALBUMIN 3.3*   CBG: No results for input(s): "GLUCAP" in the last 168 hours.  Discharge time spent: less than 30 minutes.  Signed: Marylu Lund, MD [facility] 11/07/2021

## 2021-11-07 NOTE — Progress Notes (Signed)
Dialysis nurse unable to get labs from PD cath. Chiu,MD and nephrology MD aware. Ok to still discharge patient per Chiu,MD

## 2021-11-07 NOTE — ED Notes (Signed)
Ambulatory to tx room  

## 2021-11-08 DIAGNOSIS — Z992 Dependence on renal dialysis: Secondary | ICD-10-CM | POA: Diagnosis not present

## 2021-11-08 DIAGNOSIS — N186 End stage renal disease: Secondary | ICD-10-CM | POA: Diagnosis not present

## 2021-11-09 DIAGNOSIS — N186 End stage renal disease: Secondary | ICD-10-CM | POA: Diagnosis not present

## 2021-11-09 DIAGNOSIS — Z85528 Personal history of other malignant neoplasm of kidney: Secondary | ICD-10-CM | POA: Diagnosis not present

## 2021-11-09 DIAGNOSIS — E1149 Type 2 diabetes mellitus with other diabetic neurological complication: Secondary | ICD-10-CM | POA: Diagnosis not present

## 2021-11-09 DIAGNOSIS — T85611A Breakdown (mechanical) of intraperitoneal dialysis catheter, initial encounter: Secondary | ICD-10-CM | POA: Diagnosis not present

## 2021-11-09 DIAGNOSIS — G4733 Obstructive sleep apnea (adult) (pediatric): Secondary | ICD-10-CM | POA: Diagnosis not present

## 2021-11-09 DIAGNOSIS — I120 Hypertensive chronic kidney disease with stage 5 chronic kidney disease or end stage renal disease: Secondary | ICD-10-CM | POA: Diagnosis not present

## 2021-11-09 DIAGNOSIS — Z992 Dependence on renal dialysis: Secondary | ICD-10-CM | POA: Diagnosis not present

## 2021-11-09 DIAGNOSIS — I10 Essential (primary) hypertension: Secondary | ICD-10-CM | POA: Diagnosis not present

## 2021-11-10 DIAGNOSIS — N186 End stage renal disease: Secondary | ICD-10-CM | POA: Diagnosis not present

## 2021-11-10 DIAGNOSIS — Z992 Dependence on renal dialysis: Secondary | ICD-10-CM | POA: Diagnosis not present

## 2021-11-11 DIAGNOSIS — I10 Essential (primary) hypertension: Secondary | ICD-10-CM | POA: Diagnosis not present

## 2021-11-11 DIAGNOSIS — Z992 Dependence on renal dialysis: Secondary | ICD-10-CM | POA: Diagnosis not present

## 2021-11-11 DIAGNOSIS — K219 Gastro-esophageal reflux disease without esophagitis: Secondary | ICD-10-CM | POA: Diagnosis not present

## 2021-11-11 DIAGNOSIS — E1129 Type 2 diabetes mellitus with other diabetic kidney complication: Secondary | ICD-10-CM | POA: Diagnosis not present

## 2021-11-11 DIAGNOSIS — N186 End stage renal disease: Secondary | ICD-10-CM | POA: Diagnosis not present

## 2021-11-13 DIAGNOSIS — Z992 Dependence on renal dialysis: Secondary | ICD-10-CM | POA: Diagnosis not present

## 2021-11-13 DIAGNOSIS — N186 End stage renal disease: Secondary | ICD-10-CM | POA: Diagnosis not present

## 2021-11-14 DIAGNOSIS — T859XXA Unspecified complication of internal prosthetic device, implant and graft, initial encounter: Secondary | ICD-10-CM | POA: Diagnosis not present

## 2021-11-14 DIAGNOSIS — G4730 Sleep apnea, unspecified: Secondary | ICD-10-CM | POA: Diagnosis not present

## 2021-11-14 DIAGNOSIS — E1129 Type 2 diabetes mellitus with other diabetic kidney complication: Secondary | ICD-10-CM | POA: Diagnosis not present

## 2021-11-14 DIAGNOSIS — N186 End stage renal disease: Secondary | ICD-10-CM | POA: Diagnosis not present

## 2021-11-14 DIAGNOSIS — G4733 Obstructive sleep apnea (adult) (pediatric): Secondary | ICD-10-CM | POA: Diagnosis not present

## 2021-11-14 DIAGNOSIS — Z992 Dependence on renal dialysis: Secondary | ICD-10-CM | POA: Diagnosis not present

## 2021-11-14 DIAGNOSIS — T85611A Breakdown (mechanical) of intraperitoneal dialysis catheter, initial encounter: Secondary | ICD-10-CM | POA: Diagnosis not present

## 2021-11-14 DIAGNOSIS — K565 Intestinal adhesions [bands], unspecified as to partial versus complete obstruction: Secondary | ICD-10-CM | POA: Diagnosis not present

## 2021-11-14 DIAGNOSIS — I120 Hypertensive chronic kidney disease with stage 5 chronic kidney disease or end stage renal disease: Secondary | ICD-10-CM | POA: Diagnosis not present

## 2021-11-14 DIAGNOSIS — K660 Peritoneal adhesions (postprocedural) (postinfection): Secondary | ICD-10-CM | POA: Diagnosis not present

## 2021-11-14 DIAGNOSIS — E1122 Type 2 diabetes mellitus with diabetic chronic kidney disease: Secondary | ICD-10-CM | POA: Diagnosis not present

## 2021-11-15 ENCOUNTER — Other Ambulatory Visit (HOSPITAL_COMMUNITY): Payer: Self-pay | Admitting: Internal Medicine

## 2021-11-15 DIAGNOSIS — N186 End stage renal disease: Secondary | ICD-10-CM | POA: Diagnosis not present

## 2021-11-15 DIAGNOSIS — Z992 Dependence on renal dialysis: Secondary | ICD-10-CM | POA: Diagnosis not present

## 2021-11-15 DIAGNOSIS — Z1231 Encounter for screening mammogram for malignant neoplasm of breast: Secondary | ICD-10-CM

## 2021-11-17 DIAGNOSIS — N186 End stage renal disease: Secondary | ICD-10-CM | POA: Diagnosis not present

## 2021-11-17 DIAGNOSIS — Z992 Dependence on renal dialysis: Secondary | ICD-10-CM | POA: Diagnosis not present

## 2021-11-20 DIAGNOSIS — N186 End stage renal disease: Secondary | ICD-10-CM | POA: Diagnosis not present

## 2021-11-20 DIAGNOSIS — Z992 Dependence on renal dialysis: Secondary | ICD-10-CM | POA: Diagnosis not present

## 2021-11-21 ENCOUNTER — Telehealth (INDEPENDENT_AMBULATORY_CARE_PROVIDER_SITE_OTHER): Payer: Self-pay

## 2021-11-21 ENCOUNTER — Other Ambulatory Visit (INDEPENDENT_AMBULATORY_CARE_PROVIDER_SITE_OTHER): Payer: Self-pay

## 2021-11-21 ENCOUNTER — Encounter (INDEPENDENT_AMBULATORY_CARE_PROVIDER_SITE_OTHER): Payer: Self-pay

## 2021-11-21 DIAGNOSIS — Z86010 Personal history of colonic polyps: Secondary | ICD-10-CM

## 2021-11-21 MED ORDER — PEG 3350-KCL-NA BICARB-NACL 420 G PO SOLR: 4000.0000 mL | ORAL | 0 refills | Status: DC

## 2021-11-21 NOTE — Telephone Encounter (Signed)
Margaret Huerta, CMA  ?

## 2021-11-21 NOTE — Telephone Encounter (Signed)
Referring MD/PCP: Dr Willey Blade   Procedure: tcs   Reason/Indication:  hx of colon polyps  Has patient had this procedure before?  Yes   If so, when, by whom and where? 12/2014    Is there a family history of colon cancer?  No   Who?  What age when diagnosed?    Is patient diabetic? If yes, Type 1 or Type 2   yes, type 2      Does patient have prosthetic heart valve or mechanical valve?  No   Do you have a pacemaker/defibrillator?  No   Has patient ever had endocarditis/atrial fibrillation? No   Does patient use oxygen? No   Has patient had joint replacement within last 12 months?  No   Is patient constipated or do they take laxatives? No   Does patient have a history of alcohol/drug use?  No   Have you had a stroke/heart attack last 6 mths? No   Do you take medicine for weight loss?  No   For female patients,: have you had a hysterectomy yes                       are you post menopausal No                       do you still have your menstrual cycle No   Is patient on blood thinner such as Coumadin, Plavix and/or Aspirin? Yes   Medications: asa 81 mg daily, cacitriol 0.25 mg daily, rosuvastatin 10 mg daily, duloxetine 60 mg daily, allopurinol 300 mg daily, atenolol 50 mg bid, sodium bicar 650 mg bid, furosemide 40 mg bid, isosorbide 10 mg bid, sevelamer carbonate 800 mg tid, hyralazine 100 mg tid, alprazolam 0.25 mg prn   Allergies: vicodin, demerol, morphine, codeine, naprosyn, dilaudid, fentanyl, mri dye, nsaids  Medication Adjustment per Dr Jenetta Downer none   Procedure date & time: Tuesday 12/12/21 at 130

## 2021-11-22 ENCOUNTER — Encounter (INDEPENDENT_AMBULATORY_CARE_PROVIDER_SITE_OTHER): Payer: Self-pay

## 2021-11-22 DIAGNOSIS — Z992 Dependence on renal dialysis: Secondary | ICD-10-CM | POA: Diagnosis not present

## 2021-11-22 DIAGNOSIS — Z23 Encounter for immunization: Secondary | ICD-10-CM | POA: Diagnosis not present

## 2021-11-22 DIAGNOSIS — R7309 Other abnormal glucose: Secondary | ICD-10-CM | POA: Diagnosis not present

## 2021-11-22 DIAGNOSIS — N186 End stage renal disease: Secondary | ICD-10-CM | POA: Diagnosis not present

## 2021-11-22 DIAGNOSIS — E1122 Type 2 diabetes mellitus with diabetic chronic kidney disease: Secondary | ICD-10-CM | POA: Diagnosis not present

## 2021-11-24 DIAGNOSIS — Z992 Dependence on renal dialysis: Secondary | ICD-10-CM | POA: Diagnosis not present

## 2021-11-24 DIAGNOSIS — N186 End stage renal disease: Secondary | ICD-10-CM | POA: Diagnosis not present

## 2021-11-27 DIAGNOSIS — N186 End stage renal disease: Secondary | ICD-10-CM | POA: Diagnosis not present

## 2021-11-27 DIAGNOSIS — Z992 Dependence on renal dialysis: Secondary | ICD-10-CM | POA: Diagnosis not present

## 2021-11-28 DIAGNOSIS — N186 End stage renal disease: Secondary | ICD-10-CM | POA: Diagnosis not present

## 2021-11-28 DIAGNOSIS — Z85528 Personal history of other malignant neoplasm of kidney: Secondary | ICD-10-CM | POA: Diagnosis not present

## 2021-11-28 DIAGNOSIS — T85611A Breakdown (mechanical) of intraperitoneal dialysis catheter, initial encounter: Secondary | ICD-10-CM | POA: Diagnosis not present

## 2021-11-28 DIAGNOSIS — E1149 Type 2 diabetes mellitus with other diabetic neurological complication: Secondary | ICD-10-CM | POA: Diagnosis not present

## 2021-11-28 DIAGNOSIS — Z992 Dependence on renal dialysis: Secondary | ICD-10-CM | POA: Diagnosis not present

## 2021-11-28 DIAGNOSIS — I120 Hypertensive chronic kidney disease with stage 5 chronic kidney disease or end stage renal disease: Secondary | ICD-10-CM | POA: Diagnosis not present

## 2021-11-29 DIAGNOSIS — N186 End stage renal disease: Secondary | ICD-10-CM | POA: Diagnosis not present

## 2021-11-29 DIAGNOSIS — Z992 Dependence on renal dialysis: Secondary | ICD-10-CM | POA: Diagnosis not present

## 2021-12-01 DIAGNOSIS — Z992 Dependence on renal dialysis: Secondary | ICD-10-CM | POA: Diagnosis not present

## 2021-12-01 DIAGNOSIS — N186 End stage renal disease: Secondary | ICD-10-CM | POA: Diagnosis not present

## 2021-12-04 DIAGNOSIS — N186 End stage renal disease: Secondary | ICD-10-CM | POA: Diagnosis not present

## 2021-12-04 DIAGNOSIS — Z992 Dependence on renal dialysis: Secondary | ICD-10-CM | POA: Diagnosis not present

## 2021-12-05 DIAGNOSIS — Z992 Dependence on renal dialysis: Secondary | ICD-10-CM | POA: Diagnosis not present

## 2021-12-05 DIAGNOSIS — E1122 Type 2 diabetes mellitus with diabetic chronic kidney disease: Secondary | ICD-10-CM | POA: Diagnosis not present

## 2021-12-05 DIAGNOSIS — N186 End stage renal disease: Secondary | ICD-10-CM | POA: Diagnosis not present

## 2021-12-05 DIAGNOSIS — E8779 Other fluid overload: Secondary | ICD-10-CM | POA: Diagnosis not present

## 2021-12-05 DIAGNOSIS — Z13220 Encounter for screening for lipoid disorders: Secondary | ICD-10-CM | POA: Diagnosis not present

## 2021-12-07 ENCOUNTER — Other Ambulatory Visit (INDEPENDENT_AMBULATORY_CARE_PROVIDER_SITE_OTHER): Payer: Self-pay

## 2021-12-07 ENCOUNTER — Encounter (HOSPITAL_COMMUNITY): Payer: Self-pay

## 2021-12-07 ENCOUNTER — Encounter (HOSPITAL_COMMUNITY)
Admission: RE | Admit: 2021-12-07 | Discharge: 2021-12-07 | Disposition: A | Discharge status: Home / Self Care | Payer: Medicare Other | Source: Ambulatory Visit | Attending: Gastroenterology | Admitting: Gastroenterology

## 2021-12-07 VITALS — Ht 62.0 in | Wt 152.0 lb

## 2021-12-07 DIAGNOSIS — N184 Chronic kidney disease, stage 4 (severe): Secondary | ICD-10-CM

## 2021-12-08 ENCOUNTER — Other Ambulatory Visit (INDEPENDENT_AMBULATORY_CARE_PROVIDER_SITE_OTHER): Payer: Self-pay

## 2021-12-08 DIAGNOSIS — N186 End stage renal disease: Secondary | ICD-10-CM | POA: Diagnosis not present

## 2021-12-08 DIAGNOSIS — Z992 Dependence on renal dialysis: Secondary | ICD-10-CM | POA: Diagnosis not present

## 2021-12-08 DIAGNOSIS — E8779 Other fluid overload: Secondary | ICD-10-CM | POA: Diagnosis not present

## 2021-12-11 DIAGNOSIS — N186 End stage renal disease: Secondary | ICD-10-CM | POA: Diagnosis not present

## 2021-12-11 DIAGNOSIS — Z992 Dependence on renal dialysis: Secondary | ICD-10-CM | POA: Diagnosis not present

## 2021-12-11 DIAGNOSIS — E8779 Other fluid overload: Secondary | ICD-10-CM | POA: Diagnosis not present

## 2021-12-11 MED ORDER — GENTAMICIN SULFATE 40 MG/ML IJ SOLN
1.5000 mg/kg | Freq: Once | INTRAVENOUS | Status: AC
  Administered 2021-12-12: 90 mg via INTRAVENOUS
  Filled 2021-12-11: qty 2.25

## 2021-12-11 MED ORDER — SODIUM CHLORIDE 0.9 % IV SOLN
2.0000 g | Freq: Once | INTRAVENOUS | Status: AC
  Administered 2021-12-12: 2 g via INTRAVENOUS
  Filled 2021-12-11: qty 2

## 2021-12-12 ENCOUNTER — Encounter (INDEPENDENT_AMBULATORY_CARE_PROVIDER_SITE_OTHER): Payer: Self-pay | Admitting: *Deleted

## 2021-12-12 ENCOUNTER — Encounter (HOSPITAL_COMMUNITY)
Admission: RE | Disposition: A | Discharge status: Home / Self Care | Payer: Self-pay | Source: Home / Self Care | Attending: Gastroenterology

## 2021-12-12 ENCOUNTER — Ambulatory Visit (HOSPITAL_COMMUNITY)
Admission: RE | Admit: 2021-12-12 | Discharge: 2021-12-12 | Disposition: A | Discharge status: Home / Self Care | Payer: Medicare Other | Attending: Gastroenterology | Admitting: Gastroenterology

## 2021-12-12 ENCOUNTER — Encounter (HOSPITAL_COMMUNITY): Payer: Self-pay | Admitting: Gastroenterology

## 2021-12-12 ENCOUNTER — Ambulatory Visit (HOSPITAL_BASED_OUTPATIENT_CLINIC_OR_DEPARTMENT_OTHER): Payer: Medicare Other | Admitting: Anesthesiology

## 2021-12-12 ENCOUNTER — Ambulatory Visit (HOSPITAL_COMMUNITY): Payer: Medicare Other | Admitting: Anesthesiology

## 2021-12-12 DIAGNOSIS — D120 Benign neoplasm of cecum: Secondary | ICD-10-CM | POA: Diagnosis not present

## 2021-12-12 DIAGNOSIS — N186 End stage renal disease: Secondary | ICD-10-CM | POA: Diagnosis not present

## 2021-12-12 DIAGNOSIS — K219 Gastro-esophageal reflux disease without esophagitis: Secondary | ICD-10-CM | POA: Insufficient documentation

## 2021-12-12 DIAGNOSIS — K5730 Diverticulosis of large intestine without perforation or abscess without bleeding: Secondary | ICD-10-CM | POA: Diagnosis not present

## 2021-12-12 DIAGNOSIS — R569 Unspecified convulsions: Secondary | ICD-10-CM | POA: Insufficient documentation

## 2021-12-12 DIAGNOSIS — G4730 Sleep apnea, unspecified: Secondary | ICD-10-CM | POA: Insufficient documentation

## 2021-12-12 DIAGNOSIS — E1122 Type 2 diabetes mellitus with diabetic chronic kidney disease: Secondary | ICD-10-CM | POA: Insufficient documentation

## 2021-12-12 DIAGNOSIS — Z86010 Personal history of colonic polyps: Secondary | ICD-10-CM | POA: Diagnosis not present

## 2021-12-12 DIAGNOSIS — K648 Other hemorrhoids: Secondary | ICD-10-CM | POA: Diagnosis not present

## 2021-12-12 DIAGNOSIS — I120 Hypertensive chronic kidney disease with stage 5 chronic kidney disease or end stage renal disease: Secondary | ICD-10-CM | POA: Diagnosis not present

## 2021-12-12 DIAGNOSIS — N184 Chronic kidney disease, stage 4 (severe): Secondary | ICD-10-CM

## 2021-12-12 DIAGNOSIS — Z992 Dependence on renal dialysis: Secondary | ICD-10-CM | POA: Diagnosis not present

## 2021-12-12 DIAGNOSIS — Z1211 Encounter for screening for malignant neoplasm of colon: Secondary | ICD-10-CM | POA: Diagnosis not present

## 2021-12-12 DIAGNOSIS — E785 Hyperlipidemia, unspecified: Secondary | ICD-10-CM | POA: Insufficient documentation

## 2021-12-12 DIAGNOSIS — Z8719 Personal history of other diseases of the digestive system: Secondary | ICD-10-CM | POA: Diagnosis not present

## 2021-12-12 DIAGNOSIS — D125 Benign neoplasm of sigmoid colon: Secondary | ICD-10-CM | POA: Diagnosis not present

## 2021-12-12 DIAGNOSIS — D123 Benign neoplasm of transverse colon: Secondary | ICD-10-CM

## 2021-12-12 DIAGNOSIS — I2510 Atherosclerotic heart disease of native coronary artery without angina pectoris: Secondary | ICD-10-CM | POA: Insufficient documentation

## 2021-12-12 DIAGNOSIS — Z905 Acquired absence of kidney: Secondary | ICD-10-CM | POA: Insufficient documentation

## 2021-12-12 DIAGNOSIS — Z9049 Acquired absence of other specified parts of digestive tract: Secondary | ICD-10-CM | POA: Insufficient documentation

## 2021-12-12 DIAGNOSIS — K635 Polyp of colon: Secondary | ICD-10-CM

## 2021-12-12 HISTORY — PX: COLONOSCOPY WITH PROPOFOL: SHX5780

## 2021-12-12 HISTORY — PX: POLYPECTOMY: SHX5525

## 2021-12-12 LAB — CBC WITH DIFFERENTIAL/PLATELET
Abs Immature Granulocytes: 0 10*3/uL (ref 0.00–0.07)
Basophils Absolute: 0.1 10*3/uL (ref 0.0–0.1)
Basophils Relative: 2 %
Eosinophils Absolute: 0.1 10*3/uL (ref 0.0–0.5)
Eosinophils Relative: 3 %
HCT: 42.4 % (ref 36.0–46.0)
Hemoglobin: 13.1 g/dL (ref 12.0–15.0)
Immature Granulocytes: 0 %
Lymphocytes Relative: 29 %
Lymphs Abs: 1.1 10*3/uL (ref 0.7–4.0)
MCH: 27.9 pg (ref 26.0–34.0)
MCHC: 30.9 g/dL (ref 30.0–36.0)
MCV: 90.2 fL (ref 80.0–100.0)
Monocytes Absolute: 0.4 10*3/uL (ref 0.1–1.0)
Monocytes Relative: 10 %
Neutro Abs: 2.2 10*3/uL (ref 1.7–7.7)
Neutrophils Relative %: 56 %
Platelets: 137 10*3/uL — ABNORMAL LOW (ref 150–400)
RBC: 4.7 MIL/uL (ref 3.87–5.11)
RDW: 14.5 % (ref 11.5–15.5)
WBC: 3.9 10*3/uL — ABNORMAL LOW (ref 4.0–10.5)
nRBC: 0 % (ref 0.0–0.2)

## 2021-12-12 LAB — BASIC METABOLIC PANEL
Anion gap: 7 (ref 5–15)
BUN: 28 mg/dL — ABNORMAL HIGH (ref 8–23)
CO2: 22 mmol/L (ref 22–32)
Calcium: 9.4 mg/dL (ref 8.9–10.3)
Chloride: 108 mmol/L (ref 98–111)
Creatinine, Ser: 3.51 mg/dL — ABNORMAL HIGH (ref 0.44–1.00)
GFR, Estimated: 13 mL/min — ABNORMAL LOW (ref >60)
Glucose, Bld: 137 mg/dL — ABNORMAL HIGH (ref 70–99)
Potassium: 3.9 mmol/L (ref 3.5–5.1)
Sodium: 137 mmol/L (ref 135–145)

## 2021-12-12 LAB — GLUCOSE, CAPILLARY: Glucose-Capillary: 124 mg/dL — ABNORMAL HIGH (ref 70–99)

## 2021-12-12 LAB — HM COLONOSCOPY

## 2021-12-12 SURGERY — COLONOSCOPY WITH PROPOFOL
Anesthesia: General

## 2021-12-12 MED ORDER — EPHEDRINE SULFATE-NACL 50-0.9 MG/10ML-% IV SOSY
PREFILLED_SYRINGE | INTRAVENOUS | Status: DC | PRN
  Administered 2021-12-12: 5 mg via INTRAVENOUS

## 2021-12-12 MED ORDER — PROPOFOL 10 MG/ML IV BOLUS
INTRAVENOUS | Status: DC | PRN
  Administered 2021-12-12 (×3): 20 mg via INTRAVENOUS

## 2021-12-12 MED ORDER — LIDOCAINE HCL (CARDIAC) PF 100 MG/5ML IV SOSY
PREFILLED_SYRINGE | INTRAVENOUS | Status: DC | PRN
  Administered 2021-12-12: 50 mg via INTRAVENOUS

## 2021-12-12 MED ORDER — METRONIDAZOLE 500 MG/100ML IV SOLN
500.0000 mg | INTRAVENOUS | Status: AC
  Administered 2021-12-12: 500 mg via INTRAVENOUS
  Filled 2021-12-12 (×2): qty 100

## 2021-12-12 MED ORDER — LACTATED RINGERS IV SOLN: INTRAVENOUS | Status: DC

## 2021-12-12 MED ORDER — PROPOFOL 500 MG/50ML IV EMUL
INTRAVENOUS | Status: DC | PRN
  Administered 2021-12-12: 150 ug/kg/min via INTRAVENOUS

## 2021-12-12 MED ORDER — SODIUM CHLORIDE 0.9 % IV SOLN: INTRAVENOUS | Status: DC

## 2021-12-12 NOTE — Anesthesia Preprocedure Evaluation (Signed)
Anesthesia Evaluation  Patient identified by MRN, date of birth, ID band Patient awake    Reviewed: Allergy & Precautions, NPO status , Patient's Chart, lab work & pertinent test results, reviewed documented beta blocker date and time   History of Anesthesia Complications (+) PROLONGED EMERGENCE and history of anesthetic complications  Airway Mallampati: II  TM Distance: >3 FB Neck ROM: Full    Dental  (+) Dental Advisory Given, Poor Dentition, Loose, Missing,    Pulmonary sleep apnea , pneumonia,    Pulmonary exam normal breath sounds clear to auscultation       Cardiovascular hypertension, Pt. on home beta blockers and Pt. on medications + CAD   Rhythm:Regular Rate:Normal + Systolic murmurs    Neuro/Psych  Headaches, Seizures -, Well Controlled,  PSYCHIATRIC DISORDERS Anxiety Depression  Neuromuscular disease    GI/Hepatic Neg liver ROS, hiatal hernia, GERD  Controlled,  Endo/Other  diabetes, Well Controlled, Type 2, Oral Hypoglycemic Agents, Insulin Dependent  Renal/GU ESRF and DialysisRenal disease (renal cell cancer - right nephrectomy)  negative genitourinary   Musculoskeletal  (+) Fibromyalgia -  Abdominal   Peds negative pediatric ROS (+)  Hematology  (+) Blood dyscrasia, anemia ,   Anesthesia Other Findings   Reproductive/Obstetrics negative OB ROS                             Anesthesia Physical  Anesthesia Plan  ASA: 3  Anesthesia Plan: General   Post-op Pain Management: Minimal or no pain anticipated   Induction: Intravenous  PONV Risk Score and Plan: Propofol infusion  Airway Management Planned: Nasal Cannula and Natural Airway  Additional Equipment:   Intra-op Plan:   Post-operative Plan:   Informed Consent: I have reviewed the patients History and Physical, chart, labs and discussed the procedure including the risks, benefits and alternatives for the proposed  anesthesia with the patient or authorized representative who has indicated his/her understanding and acceptance.     Dental advisory given  Plan Discussed with: CRNA and Surgeon  Anesthesia Plan Comments:         Anesthesia Quick Evaluation

## 2021-12-12 NOTE — H&P (Signed)
Margaret Huerta is an 76 y.o. female.   Chief Complaint: CRC screening HPI: 76 year old female with past medical history of end-stage renal disease previously on peritoneal dialysis now on hemodialysis, hypertension, hyperlipidemia, for myalgia, GERD, depression, anxiety, cervical cancer, sleep apnea, type 2 diabetes, renal cell cancer status post resection, osteoporosis, who for colorectal cancer screening  The patient denies having any nausea, vomiting, fever, chills, hematochezia, melena, hematemesis, abdominal distention, abdominal pain, diarrhea, jaundice, pruritus or weight loss. Colonoscopy in November 2011 revealed sessile serrated polyp in the transverse colon.  Colonoscopy in September 2016 did not reveal any polyps.  She was advised to return for another exam in 7 years.  Past Medical History:  Diagnosis Date   Anemia    Anxiety    Cervical cancer ([place])    Chronic bronchitis ([place])    "get it q yr"   Chronic lower back pain    Depression    Febrile seizure ([place])    "as a child"   Fibromyalgia    GERD (gastroesophageal reflux disease)    Gout    History of blood transfusion    "S/P tonsillectomy"   History of hiatal hernia    Hx of cardiovascular stress test 05/14/2014   false positive Myoview   Hyperlipidemia    Hypertension    Migraine    "monthly" (05/26/2014)   Neuropathy    Osteoporosis    Parathyroid disease ([place])    "my PHT levels run high; I can't take calcium"   Pneumonia "several times"   Renal cancer ([place])    "right"-s/p nephrectomy   Renal insufficiency    "left kidney works at 40-60%" (05/26/2014)   Sleep apnea    "wore mask for 2 months; could not take it" (05/26/2014)   Type II diabetes mellitus ([place])    Typhus fever    "as a child"    Past Surgical History:  Procedure Laterality Date   ABDOMINAL HERNIA REPAIR  02/2013   "in [place]"   [place]   "partial"   APPENDECTOMY     AV FISTULA PLACEMENT Left 06/13/2021   Procedure:  LEFT ARM ARTERIOVENOUS FISTULA CREATION;  Surgeon: Rosetta Posner, MD;  Location: [facility];  Service: Vascular;  Laterality: Left;   BACK SURGERY     BILATERAL SALPINGOOPHORECTOMY Bilateral ~ [place]  2003, April 2016   normal coronaries   CATARACT EXTRACTION W/ INTRAOCULAR LENS  IMPLANT, BILATERAL  2015   COLONOSCOPY N/A 11/25/2014   Procedure: COLONOSCOPY;  Surgeon: Rogene Houston, MD;  Location: [facility];  Service: Endoscopy;  Laterality: N/A;   CYSTOSCOPY W/ [place] MANIPULATION  1988   DIAGNOSTIC LAPAROSCOPY  1975   DILATION AND CURETTAGE OF UTERUS     ESOPHAGOGASTRODUODENOSCOPY N/A 11/25/2014   Procedure: ESOPHAGOGASTRODUODENOSCOPY (EGD);  Surgeon: Rogene Houston, MD;  Location: [facility];  Service: Endoscopy;  Laterality: N/A;  9:30 - moved to 11:25 - [name] to notify pt   ESOPHAGOGASTRODUODENOSCOPY (EGD) [place]     GASTRIC BYPASS  2012   HERNIA REPAIR     2015   [place]   INSERTION OF DIALYSIS CATHETER Right 09/19/2021   Procedure: INSERTION OF TUNNELED DIALYSIS CATHETER;  Surgeon: Rosetta Posner, MD;  Location: [facility];  Service: Vascular;  Laterality: Right;   KNEE ARTHROSCOPY Left 2000's   LAPAROSCOPIC CHOLECYSTECTOMY  1990's  LEFT HEART CATHETERIZATION WITH CORONARY ANGIOGRAM N/A 05/27/2014   Procedure: LEFT HEART CATHETERIZATION WITH CORONARY ANGIOGRAM;  Surgeon: Lorretta Harp, MD;  Location: [facility] C[facility] LAB;  Service: Cardiovascular;  Laterality: N/A;   LIGATION OF ARTERIOVENOUS  FISTULA Left 09/19/2021   Procedure: LIGATION OF ARTERIOVENOUS  FISTULA;  Surgeon: Rosetta Posner, MD;  Location: [facility];  Service: Vascular;  Laterality: Left;   [place] SURGERY  2001   "herniated discs"   [place]   "drilled into gum and put tooth implants uppers"   NEPHRECTOMY Right 2002   "cancer"   TMJ ARTHROPLASTY  1988   TONSILLECTOMY  1976   UPPER GI ENDOSCOPY       Family History  Problem Relation Age of Onset   Emphysema Father    Heart disease Father    Clotting disorder Mother    Diabetes Mother    Kidney disease Mother    Allergies Other        whole family per pt   Lung cancer Maternal Uncle    Alcohol abuse Maternal Uncle    Social History:  reports that she has never smoked. She has never been exposed to tobacco smoke. She has never used smokeless tobacco. She reports that she does not drink alcohol and does not use drugs.  Allergies:  Allergies  Allergen Reactions   Demerol [Meperidine] Other (See Comments)    Stop breathing   Lyrica [Pregabalin] Swelling    Stomach lesion   Vicodin [Hydrocodone-Acetaminophen] Other (See Comments)    Stop breathing   Ace Inhibitors Cough   Aleve [Naproxen] Other (See Comments)    stomach lesion   Codeine Other (See Comments)    hallucination, confused   Contrast Media [Iodinated Contrast Media] Nausea Only and Other (See Comments)    MRI dye. Shaky, nausea   Fentanyl Itching   Gluten Meal Diarrhea    GI-upset   Lactose Intolerance (Gi) Other (See Comments)    Gi upset   Morphine Nausea And Vomiting   Neurontin [Gabapentin] Nausea Only, Swelling and Other (See Comments)    Confusion. Felt like in a dream. foggy   Norvasc [Amlodipine] Swelling   Nsaids Other (See Comments)    Severe chronic kidney disease   Other Other (See Comments)    Anesthesia--prolonged sleep inertia (grogginess/disorientation/cognitive impairment)   Dilaudid [Hydromorphone Hcl] Itching, Nausea And Vomiting and Rash    Medications Prior to Admission  Medication Sig Dispense Refill   acetaminophen (TYLENOL) 500 MG tablet Take 1,000 mg by mouth every 6 (six) hours as needed (for pain.).     allopurinol (ZYLOPRIM) 300 MG tablet Take 300 mg by mouth in the morning.  12   ALPRAZolam (XANAX) 0.25 MG tablet Take 0.25 mg by mouth every Monday, Wednesday, and Friday with hemodialysis.     aspirin EC 81 MG tablet Take  81 mg by mouth in the morning. Swallow whole.     atenolol (TENORMIN) 50 MG tablet Take 50 mg by mouth 2 (two) times daily.     calcitRIOL (ROCALTROL) 0.25 MCG capsule Take 0.25 mcg by mouth in the morning.     Cholecalciferol (VITAMIN D3) 250 MCG (10000 UT) TABS Take 10,000 Units by mouth in the morning.     docusate sodium (COLACE) 50 MG capsule Take 50 mg by mouth at bedtime.     DULoxetine (CYMBALTA) 60 MG capsule Take 60 mg by mouth in the morning.     furosemide (LASIX) 40 MG tablet  Take 40 mg by mouth 2 (two) times daily.     hydrALAZINE (APRESOLINE) 50 MG tablet Take 100 mg by mouth in the morning, at noon, and at bedtime.     isosorbide dinitrate (ISORDIL) 10 MG tablet Take 1 tablet (10 mg total) by mouth 2 (two) times daily. 180 tablet 3   lidocaine-prilocaine (EMLA) cream Apply 1 Application topically as needed (when have a port).     NITROSTAT 0.4 MG SL tablet Place 1 tablet (0.4 mg total) under the tongue as needed. 25 tablet 3   polyethylene glycol-electrolytes (TRILYTE) 420 g solution Take 4,000 mLs by mouth as directed. 4000 mL 0   rosuvastatin (CRESTOR) 10 MG tablet Take 1 tablet (10 mg total) by mouth daily.     sevelamer carbonate (RENVELA) 800 MG tablet Take 800 mg by mouth 3 (three) times daily.     sodium bicarbonate 650 MG tablet Take 650 mg by mouth 2 (two) times daily.      Results for orders placed or performed during the hospital encounter of 12/12/21 (from the past 48 hour(s))  Glucose, capillary     Status: Abnormal   Collection Time: 12/12/21 10:34 AM  Result Value Ref Range   Glucose-Capillary 124 (H) 70 - 99 mg/dL    Comment: Glucose reference range applies only to samples taken after fasting for at least 8 hours.  CBC with Differential/Platelet     Status: Abnormal   Collection Time: 12/12/21 11:06 AM  Result Value Ref Range   WBC 3.9 (L) 4.0 - 10.5 K/uL   RBC 4.70 3.87 - 5.11 MIL/uL   Hemoglobin 13.1 12.0 - 15.0 g/dL   HCT 42.4 36.0 - 46.0 %   MCV 90.2  80.0 - 100.0 fL   MCH 27.9 26.0 - 34.0 pg   MCHC 30.9 30.0 - 36.0 g/dL   RDW 14.5 11.5 - 15.5 %   Platelets 137 (L) 150 - 400 K/uL   nRBC 0.0 0.0 - 0.2 %   Neutrophils Relative % 56 %   Neutro Abs 2.2 1.7 - 7.7 K/uL   Lymphocytes Relative 29 %   Lymphs Abs 1.1 0.7 - 4.0 K/uL   Monocytes Relative 10 %   Monocytes Absolute 0.4 0.1 - 1.0 K/uL   Eosinophils Relative 3 %   Eosinophils Absolute 0.1 0.0 - 0.5 K/uL   Basophils Relative 2 %   Basophils Absolute 0.1 0.0 - 0.1 K/uL   Immature Granulocytes 0 %   Abs Immature Granulocytes 0.00 0.00 - 0.07 K/uL    Comment: Performed at [facility], [address], [place], [place] [address]   No results found.  Review of Systems  All other systems reviewed and are negative.   Blood pressure 135/62, pulse 63, resp. rate 11, height '5\' 2"'$  (1.575 m), weight 68.9 kg, SpO2 99 %. Physical Exam  GENERAL: The patient is AO x3, in no acute distress. HEENT: Head is normocephalic and atraumatic. EOMI are intact. Mouth is well hydrated and without lesions. NECK: Supple. No masses LUNGS: Clear to auscultation. No presence of rhonchi/wheezing/rales. Adequate chest expansion HEART: RRR, normal s1 and s2. ABDOMEN: Soft, nontender, no guarding, no peritoneal signs, and nondistended. BS +. No masses. EXTREMITIES: Without any cyanosis, clubbing, rash, lesions or edema. NEUROLOGIC: AOx3, no focal motor deficit. SKIN: no jaundice, no rashes  Assessment/Plan 76 year old female with past medical history of end-stage renal disease previously on peritoneal dialysis now on hemodialysis, hypertension, hyperlipidemia, for myalgia, GERD, depression, anxiety, cervical cancer, sleep apnea,  type 2 diabetes, renal cell cancer status post resection, osteoporosis, who for colorectal cancer screening.  We will proceed with colonoscopy.  Harvel Quale, MD 12/12/2021, 11:22 AM

## 2021-12-12 NOTE — Op Note (Signed)
[facility] Patient Name: Margaret Huerta Procedure Date: 12/12/2021 11:20 AM MRN: [id number] Date of Birth: [birth year] Attending MD: Maylon Peppers , , [id number] CSN: [id number] Age: 76 Admit Type: Outpatient Procedure:                Colonoscopy Indications:              Screening for colorectal malignant neoplasm Providers:                Maylon Peppers, Rosina Lowenstein, RN, Ladoris Gene                            Technician, Technician Referring MD:              Medicines:                Monitored Anesthesia Care Complications:            No immediate complications. Estimated Blood Loss:     Estimated blood loss: none. Procedure:                Pre-Anesthesia Assessment:                           - Prior to the procedure, a History and Physical                            was performed, and patient medications, allergies                            and sensitivities were reviewed. The patient's                            tolerance of previous anesthesia was reviewed.                           - The risks and benefits of the procedure and the                            sedation options and risks were discussed with the                            patient. All questions were answered and informed                            consent was obtained.                           - ASA Grade Assessment: III - A patient with severe                            systemic disease.                           After obtaining informed consent, the colonoscope                            was passed under direct vision. Throughout the  procedure, the patient's blood pressure, pulse, and                            oxygen saturations were monitored continuously. The                            [id number]-HQ190L ([id number]) scope was introduced through                            the anus and advanced to the the cecum, identified                            by appendiceal orifice and ileocecal  valve. The                            colonoscopy was unusually difficult due to                            significant looping. The patient tolerated the                            procedure well. The quality of the bowel                            preparation was good. Scope In: 11:37:28 AM Scope Out: 12:14:27 PM Scope Withdrawal Time: 0 hours 19 minutes 36 seconds  Total Procedure Duration: 0 hours 36 minutes 59 seconds  Findings:      The perianal and digital rectal examinations were normal.      Two sessile polyps were found in the transverse colon and cecum. The       polyps were 8 mm in size. These polyps were removed with a cold snare.       Resection and retrieval were complete.      An 8 mm polyp was found in the sigmoid colon. The polyp was       multi-lobulated. The polyp was removed with a cold snare. Resection and       retrieval were complete.      A few small-mouthed diverticula were found in the sigmoid colon and       transverse colon.      The sigmoid colon revealed moderately excessive looping.      Non-bleeding internal hemorrhoids were found during retroflexion. The       hemorrhoids were small. Impression:               - Two 8 mm polyps in the transverse colon and in                            the cecum, removed with a cold snare. Resected and                            retrieved.                           - One 8 mm polyp in the sigmoid colon, removed with  a cold snare. Resected and retrieved.                           - Diverticulosis in the sigmoid colon and in the                            transverse colon.                           - There was significant looping of the colon.                           - Non-bleeding internal hemorrhoids. Moderate Sedation:      Per Anesthesia Care Recommendation:           - Discharge patient to home (ambulatory).                           - Resume previous diet.                           -  Await pathology results.                           - Repeat colonoscopy for surveillance based on                            pathology results. Procedure Code(s):        --- Professional ---                           [phone], Colonoscopy, flexible; with removal of                            tumor(s), polyp(s), or other lesion(s) by snare                            technique Diagnosis Code(s):        --- Professional ---                           Z12.11, Encounter for screening for malignant                            neoplasm of colon                           D12.3, Benign neoplasm of transverse colon (hepatic                            flexure or splenic flexure)                           D12.0, Benign neoplasm of cecum                           D12.5, Benign neoplasm of sigmoid colon  K64.8, Other hemorrhoids                           K57.30, Diverticulosis of large intestine without                            perforation or abscess without bleeding CPT copyright 2022 American Medical Association. All rights reserved. The codes documented in this report are preliminary and upon coder review may  be revised to meet current compliance requirements. Maylon Peppers, MD Maylon Peppers,  12/12/2021 12:21:07 PM This report has been signed electronically. Number of Addenda: 0

## 2021-12-12 NOTE — Discharge Instructions (Addendum)
You are being discharged to home.  Resume your previous diet.  We are waiting for your pathology results.  Your physician has recommended a repeat colonoscopy for surveillance based on pathology results.  

## 2021-12-12 NOTE — Anesthesia Postprocedure Evaluation (Signed)
Anesthesia Post Note  Patient: Margaret Huerta  Procedure(s) Performed: COLONOSCOPY WITH PROPOFOL POLYPECTOMY  Patient location during evaluation: Phase II Anesthesia Type: General Level of consciousness: awake and alert and oriented Pain management: pain level controlled Vital Signs Assessment: post-procedure vital signs reviewed and stable Respiratory status: spontaneous breathing, nonlabored ventilation and respiratory function stable Cardiovascular status: blood pressure returned to baseline and stable Postop Assessment: no apparent nausea or vomiting Anesthetic complications: no   No notable events documented.   Last Vitals:  Vitals:   12/12/21 1033 12/12/21 1220  BP: 135/62 (!) 118/50  Pulse: 63   Resp: 11 12  Temp:  36.6 C  SpO2: 99% 99%    Last Pain:  Vitals:   12/12/21 1220  TempSrc: Oral  PainSc: 0-No pain                 Maanya Hippert C Archer Moist

## 2021-12-12 NOTE — Transfer of Care (Signed)
Immediate Anesthesia Transfer of Care Note  Patient: Margaret Huerta  Procedure(s) Performed: COLONOSCOPY WITH PROPOFOL POLYPECTOMY  Patient Location: PACU  Anesthesia Type:MAC  Level of Consciousness: drowsy  Airway & Oxygen Therapy: Patient Spontanous Breathing and Patient connected to face mask oxygen  Post-op Assessment: Report given to RN and Post -op Vital signs reviewed and stable  Post vital signs: Reviewed and stable  Last Vitals:  Vitals Value Taken Time  BP    Temp    Pulse    Resp    SpO2      Last Pain:  Vitals:   12/12/21 1132  TempSrc:   PainSc: 0-No pain         Complications: No notable events documented.

## 2021-12-12 NOTE — Anesthesia Procedure Notes (Signed)
Procedure Name: MAC Date/Time: 12/12/2021 11:39 AM  Performed by: Lieutenant Diego, CRNAPre-anesthesia Checklist: Patient identified, Emergency Drugs available, Suction available, Patient being monitored and Timeout performed Patient Re-evaluated:Patient Re-evaluated prior to induction Oxygen Delivery Method: Simple face mask Preoxygenation: Pre-oxygenation with 100% oxygen Induction Type: IV induction

## 2021-12-13 LAB — SURGICAL PATHOLOGY

## 2021-12-14 ENCOUNTER — Telehealth (INDEPENDENT_AMBULATORY_CARE_PROVIDER_SITE_OTHER): Payer: Self-pay | Admitting: *Deleted

## 2021-12-14 DIAGNOSIS — N186 End stage renal disease: Secondary | ICD-10-CM | POA: Diagnosis not present

## 2021-12-14 DIAGNOSIS — E8779 Other fluid overload: Secondary | ICD-10-CM | POA: Diagnosis not present

## 2021-12-14 DIAGNOSIS — Z992 Dependence on renal dialysis: Secondary | ICD-10-CM | POA: Diagnosis not present

## 2021-12-14 DIAGNOSIS — Z23 Encounter for immunization: Secondary | ICD-10-CM | POA: Diagnosis not present

## 2021-12-14 NOTE — Telephone Encounter (Signed)
TCS on Tuesday and woke up last night and had some rectal bleeding when having BM. Blood is a little dark. No rectal pain, no abdominal pain. Has had rectal bleeding 3 times today as well when having BM.   [phone]

## 2021-12-14 NOTE — Telephone Encounter (Signed)
Patient reports that she has had blood in her stool every time she moves her bowels and has felt lightheaded and very tired during the day which is unusual for her.  She was feeling well after the procedure and this started today. I advised the patient to come to the ER to have further evaluation of her rectal bleeding after recent polypectomy on Tuesday.  Although unusual to have delayed bleeding after cold snare polypectomy, if she has pretty significant drop in her hemoglobin we may need to repeat colonoscopy.

## 2021-12-15 DIAGNOSIS — Z992 Dependence on renal dialysis: Secondary | ICD-10-CM | POA: Diagnosis not present

## 2021-12-15 DIAGNOSIS — N186 End stage renal disease: Secondary | ICD-10-CM | POA: Diagnosis not present

## 2021-12-15 DIAGNOSIS — E8779 Other fluid overload: Secondary | ICD-10-CM | POA: Diagnosis not present

## 2021-12-15 DIAGNOSIS — Z23 Encounter for immunization: Secondary | ICD-10-CM | POA: Diagnosis not present

## 2021-12-18 DIAGNOSIS — Z23 Encounter for immunization: Secondary | ICD-10-CM | POA: Diagnosis not present

## 2021-12-18 DIAGNOSIS — E8779 Other fluid overload: Secondary | ICD-10-CM | POA: Diagnosis not present

## 2021-12-18 DIAGNOSIS — N186 End stage renal disease: Secondary | ICD-10-CM | POA: Diagnosis not present

## 2021-12-18 DIAGNOSIS — Z992 Dependence on renal dialysis: Secondary | ICD-10-CM | POA: Diagnosis not present

## 2021-12-19 DIAGNOSIS — Z23 Encounter for immunization: Secondary | ICD-10-CM | POA: Diagnosis not present

## 2021-12-19 DIAGNOSIS — N186 End stage renal disease: Secondary | ICD-10-CM | POA: Diagnosis not present

## 2021-12-19 DIAGNOSIS — E8779 Other fluid overload: Secondary | ICD-10-CM | POA: Diagnosis not present

## 2021-12-19 DIAGNOSIS — Z992 Dependence on renal dialysis: Secondary | ICD-10-CM | POA: Diagnosis not present

## 2021-12-20 ENCOUNTER — Encounter (HOSPITAL_COMMUNITY): Payer: Self-pay | Admitting: Gastroenterology

## 2021-12-21 DIAGNOSIS — E8779 Other fluid overload: Secondary | ICD-10-CM | POA: Diagnosis not present

## 2021-12-21 DIAGNOSIS — Z992 Dependence on renal dialysis: Secondary | ICD-10-CM | POA: Diagnosis not present

## 2021-12-21 DIAGNOSIS — Z23 Encounter for immunization: Secondary | ICD-10-CM | POA: Diagnosis not present

## 2021-12-21 DIAGNOSIS — N186 End stage renal disease: Secondary | ICD-10-CM | POA: Diagnosis not present

## 2021-12-22 DIAGNOSIS — Z992 Dependence on renal dialysis: Secondary | ICD-10-CM | POA: Diagnosis not present

## 2021-12-22 DIAGNOSIS — N186 End stage renal disease: Secondary | ICD-10-CM | POA: Diagnosis not present

## 2021-12-22 DIAGNOSIS — Z23 Encounter for immunization: Secondary | ICD-10-CM | POA: Diagnosis not present

## 2021-12-22 DIAGNOSIS — E8779 Other fluid overload: Secondary | ICD-10-CM | POA: Diagnosis not present

## 2021-12-25 DIAGNOSIS — E8779 Other fluid overload: Secondary | ICD-10-CM | POA: Diagnosis not present

## 2021-12-25 DIAGNOSIS — N186 End stage renal disease: Secondary | ICD-10-CM | POA: Diagnosis not present

## 2021-12-25 DIAGNOSIS — Z23 Encounter for immunization: Secondary | ICD-10-CM | POA: Diagnosis not present

## 2021-12-25 DIAGNOSIS — Z992 Dependence on renal dialysis: Secondary | ICD-10-CM | POA: Diagnosis not present

## 2021-12-26 DIAGNOSIS — Z992 Dependence on renal dialysis: Secondary | ICD-10-CM | POA: Diagnosis not present

## 2021-12-26 DIAGNOSIS — E8779 Other fluid overload: Secondary | ICD-10-CM | POA: Diagnosis not present

## 2021-12-26 DIAGNOSIS — Z23 Encounter for immunization: Secondary | ICD-10-CM | POA: Diagnosis not present

## 2021-12-26 DIAGNOSIS — N186 End stage renal disease: Secondary | ICD-10-CM | POA: Diagnosis not present

## 2021-12-29 DIAGNOSIS — Z992 Dependence on renal dialysis: Secondary | ICD-10-CM | POA: Diagnosis not present

## 2021-12-29 DIAGNOSIS — N186 End stage renal disease: Secondary | ICD-10-CM | POA: Diagnosis not present

## 2021-12-29 DIAGNOSIS — E8779 Other fluid overload: Secondary | ICD-10-CM | POA: Diagnosis not present

## 2021-12-29 DIAGNOSIS — Z23 Encounter for immunization: Secondary | ICD-10-CM | POA: Diagnosis not present

## 2022-01-01 DIAGNOSIS — Z23 Encounter for immunization: Secondary | ICD-10-CM | POA: Diagnosis not present

## 2022-01-01 DIAGNOSIS — E8779 Other fluid overload: Secondary | ICD-10-CM | POA: Diagnosis not present

## 2022-01-01 DIAGNOSIS — Z992 Dependence on renal dialysis: Secondary | ICD-10-CM | POA: Diagnosis not present

## 2022-01-01 DIAGNOSIS — N186 End stage renal disease: Secondary | ICD-10-CM | POA: Diagnosis not present

## 2022-01-02 DIAGNOSIS — E8779 Other fluid overload: Secondary | ICD-10-CM | POA: Diagnosis not present

## 2022-01-02 DIAGNOSIS — Z992 Dependence on renal dialysis: Secondary | ICD-10-CM | POA: Diagnosis not present

## 2022-01-02 DIAGNOSIS — N186 End stage renal disease: Secondary | ICD-10-CM | POA: Diagnosis not present

## 2022-01-02 DIAGNOSIS — Z23 Encounter for immunization: Secondary | ICD-10-CM | POA: Diagnosis not present

## 2022-01-05 DIAGNOSIS — N186 End stage renal disease: Secondary | ICD-10-CM | POA: Diagnosis not present

## 2022-01-05 DIAGNOSIS — Z992 Dependence on renal dialysis: Secondary | ICD-10-CM | POA: Diagnosis not present

## 2022-01-08 DIAGNOSIS — Z23 Encounter for immunization: Secondary | ICD-10-CM | POA: Diagnosis not present

## 2022-01-08 DIAGNOSIS — Z992 Dependence on renal dialysis: Secondary | ICD-10-CM | POA: Diagnosis not present

## 2022-01-08 DIAGNOSIS — E8779 Other fluid overload: Secondary | ICD-10-CM | POA: Diagnosis not present

## 2022-01-08 DIAGNOSIS — N186 End stage renal disease: Secondary | ICD-10-CM | POA: Diagnosis not present

## 2022-01-09 DIAGNOSIS — E8779 Other fluid overload: Secondary | ICD-10-CM | POA: Diagnosis not present

## 2022-01-09 DIAGNOSIS — Z23 Encounter for immunization: Secondary | ICD-10-CM | POA: Diagnosis not present

## 2022-01-09 DIAGNOSIS — Z992 Dependence on renal dialysis: Secondary | ICD-10-CM | POA: Diagnosis not present

## 2022-01-09 DIAGNOSIS — N186 End stage renal disease: Secondary | ICD-10-CM | POA: Diagnosis not present

## 2022-01-11 DIAGNOSIS — Z992 Dependence on renal dialysis: Secondary | ICD-10-CM | POA: Diagnosis not present

## 2022-01-11 DIAGNOSIS — E8779 Other fluid overload: Secondary | ICD-10-CM | POA: Diagnosis not present

## 2022-01-11 DIAGNOSIS — N186 End stage renal disease: Secondary | ICD-10-CM | POA: Diagnosis not present

## 2022-01-11 DIAGNOSIS — Z23 Encounter for immunization: Secondary | ICD-10-CM | POA: Diagnosis not present

## 2022-01-12 ENCOUNTER — Ambulatory Visit
Admission: EM | Admit: 2022-01-12 | Discharge: 2022-01-12 | Disposition: A | Discharge status: Home / Self Care | Payer: Medicare Other | Attending: Family Medicine | Admitting: Family Medicine

## 2022-01-12 ENCOUNTER — Emergency Department (HOSPITAL_COMMUNITY): Payer: Medicare Other

## 2022-01-12 ENCOUNTER — Encounter (HOSPITAL_COMMUNITY): Payer: Self-pay | Admitting: *Deleted

## 2022-01-12 ENCOUNTER — Emergency Department (HOSPITAL_COMMUNITY)
Admission: EM | Admit: 2022-01-12 | Discharge: 2022-01-12 | Disposition: A | Discharge status: Home / Self Care | Payer: Medicare Other | Attending: Emergency Medicine | Admitting: Emergency Medicine

## 2022-01-12 ENCOUNTER — Encounter: Payer: Self-pay | Admitting: Emergency Medicine

## 2022-01-12 ENCOUNTER — Other Ambulatory Visit: Payer: Self-pay

## 2022-01-12 DIAGNOSIS — M79602 Pain in left arm: Secondary | ICD-10-CM | POA: Diagnosis not present

## 2022-01-12 DIAGNOSIS — Z20822 Contact with and (suspected) exposure to covid-19: Secondary | ICD-10-CM | POA: Insufficient documentation

## 2022-01-12 DIAGNOSIS — R079 Chest pain, unspecified: Secondary | ICD-10-CM

## 2022-01-12 DIAGNOSIS — I959 Hypotension, unspecified: Secondary | ICD-10-CM | POA: Diagnosis not present

## 2022-01-12 DIAGNOSIS — R0602 Shortness of breath: Secondary | ICD-10-CM

## 2022-01-12 DIAGNOSIS — Z7982 Long term (current) use of aspirin: Secondary | ICD-10-CM | POA: Insufficient documentation

## 2022-01-12 DIAGNOSIS — D696 Thrombocytopenia, unspecified: Secondary | ICD-10-CM | POA: Insufficient documentation

## 2022-01-12 DIAGNOSIS — Z992 Dependence on renal dialysis: Secondary | ICD-10-CM | POA: Diagnosis not present

## 2022-01-12 DIAGNOSIS — E8779 Other fluid overload: Secondary | ICD-10-CM | POA: Diagnosis not present

## 2022-01-12 DIAGNOSIS — N186 End stage renal disease: Secondary | ICD-10-CM | POA: Insufficient documentation

## 2022-01-12 DIAGNOSIS — I120 Hypertensive chronic kidney disease with stage 5 chronic kidney disease or end stage renal disease: Secondary | ICD-10-CM | POA: Diagnosis not present

## 2022-01-12 DIAGNOSIS — Z8616 Personal history of COVID-19: Secondary | ICD-10-CM | POA: Diagnosis not present

## 2022-01-12 HISTORY — DX: Dependence on renal dialysis: Z99.2

## 2022-01-12 LAB — BASIC METABOLIC PANEL
Anion gap: 9 (ref 5–15)
BUN: 16 mg/dL (ref 8–23)
CO2: 26 mmol/L (ref 22–32)
Calcium: 9.3 mg/dL (ref 8.9–10.3)
Chloride: 101 mmol/L (ref 98–111)
Creatinine, Ser: 2.91 mg/dL — ABNORMAL HIGH (ref 0.44–1.00)
GFR, Estimated: 16 mL/min — ABNORMAL LOW (ref >60)
Glucose, Bld: 109 mg/dL — ABNORMAL HIGH (ref 70–99)
Potassium: 3.7 mmol/L (ref 3.5–5.1)
Sodium: 136 mmol/L (ref 135–145)

## 2022-01-12 LAB — CBC
HCT: 39.5 % (ref 36.0–46.0)
Hemoglobin: 12.3 g/dL (ref 12.0–15.0)
MCH: 27.4 pg (ref 26.0–34.0)
MCHC: 31.1 g/dL (ref 30.0–36.0)
MCV: 88 fL (ref 80.0–100.0)
Platelets: 82 10*3/uL — ABNORMAL LOW (ref 150–400)
RBC: 4.49 MIL/uL (ref 3.87–5.11)
RDW: 15.2 % (ref 11.5–15.5)
WBC: 3.7 10*3/uL — ABNORMAL LOW (ref 4.0–10.5)
nRBC: 0 % (ref 0.0–0.2)

## 2022-01-12 LAB — TROPONIN I (HIGH SENSITIVITY)
Troponin I (High Sensitivity): 9 ng/L (ref <18)
Troponin I (High Sensitivity): 9 ng/L (ref <18)

## 2022-01-12 LAB — RESP PANEL BY RT-PCR (FLU A&B, COVID) ARPGX2
Influenza A by PCR: NEGATIVE
Influenza B by PCR: NEGATIVE
SARS Coronavirus 2 by RT PCR: NEGATIVE

## 2022-01-12 LAB — CBG MONITORING, ED: Glucose-Capillary: 113 mg/dL — ABNORMAL HIGH (ref 70–99)

## 2022-01-12 NOTE — Discharge Instructions (Addendum)
Please follow-up with your doctor within the next 2 days for a recheck, come back to the ER immediately for any severe or worsening symptoms  Your platelets are low, please have your family doctor recheck them at your next visit within the next week

## 2022-01-12 NOTE — ED Provider Notes (Signed)
[facility] EMERGENCY DEPARTMENT Provider Note   CSN: [id number] Arrival date & time: 01/12/22  1555     History  Chief Complaint  Patient presents with   Hypotension    Margaret Huerta is a 76 y.o. female.  HPI   This patient is a 76 year old female, she has end-stage renal disease on dialysis which she started in August approximately 3-1/2 months ago.  The patient has a history of renal cell carcinoma status post removal of one of her kidneys.  She had progressive failure of her other kidney and after having COVID-19 approximately 8 months ago she went into florid kidney failure and required ultimately dialysis to be started.  She initially had a fistula in her left upper extremity but this caused steal syndrome, she then had a peritoneal dialysis catheter which her body rejected, she then had a catheter placed in her right upper chest through which she currently gets dialysis.  She was at dialysis today when she developed hypotension.  She has measured vital signs in the 70s which responded to 2 small aliquots of IV fluids which were given through dialysis.  At this time her blood pressure is improved but she reports to me that she has actually had a feeling of illness ever since waking up this morning.  This involves a generalized fogginess, chest discomfort in her chest and shortness of breath with exertion.  She has never had coronary disease that she knows of.  She does not take any anticoagulants.  She does take a baby aspirin only.  She is having intermittent chest pain throughout the day today.  It is poorly described but not associated with diaphoresis.  She has been nauseated.  She still makes some urine.  She has not had any diarrhea  Home Medications Prior to Admission medications   Medication Sig Start Date End Date Taking? Authorizing Provider  acetaminophen (TYLENOL) 500 MG tablet Take 1,000 mg by mouth every 6 (six) hours as needed (for pain.).    [provider]   allopurinol (ZYLOPRIM) 300 MG tablet Take 300 mg by mouth in the morning. 05/09/17   [provider]  ALPRAZolam Duanne Moron) 0.25 MG tablet Take 0.25 mg by mouth every Monday, Wednesday, and Friday with hemodialysis. 05/23/21   [provider]  aspirin EC 81 MG tablet Take 81 mg by mouth in the morning. Swallow whole.    [provider]  atenolol (TENORMIN) 50 MG tablet Take 50 mg by mouth 2 (two) times daily.    [provider]  calcitRIOL (ROCALTROL) 0.25 MCG capsule Take 0.25 mcg by mouth in the morning.    [provider]  Cholecalciferol (VITAMIN D3) 250 MCG (10000 UT) TABS Take 10,000 Units by mouth in the morning.    [provider]  docusate sodium (COLACE) 50 MG capsule Take 50 mg by mouth at bedtime.    [provider]  DULoxetine (CYMBALTA) 60 MG capsule Take 60 mg by mouth in the morning.    [provider]  furosemide (LASIX) 40 MG tablet Take 40 mg by mouth 2 (two) times daily.    [provider]  hydrALAZINE (APRESOLINE) 50 MG tablet Take 100 mg by mouth in the morning, at noon, and at bedtime. 02/28/21   [provider]  isosorbide dinitrate (ISORDIL) 10 MG tablet Take 1 tablet (10 mg total) by mouth 2 (two) times daily. 07/21/21   Arnoldo Lenis, MD  lidocaine-prilocaine (EMLA) cream Apply 1 Application topically as  needed (when have a port). 09/13/21   [provider]  NITROSTAT 0.4 MG SL tablet Place 1 tablet (0.4 mg total) under the tongue as needed. 07/21/21   Arnoldo Lenis, MD  rosuvastatin (CRESTOR) 10 MG tablet Take 1 tablet (10 mg total) by mouth daily. 12/01/19   Verta Ellen., NP  sevelamer carbonate (RENVELA) 800 MG tablet Take 800 mg by mouth 3 (three) times daily. 09/13/21   [provider]  sodium bicarbonate 650 MG tablet Take 650 mg by mouth 2 (two) times daily. 04/15/21   [provider]      Allergies    Demerol [meperidine], Lyrica [pregabalin],  Vicodin [hydrocodone-acetaminophen], Ace inhibitors, Aleve [naproxen], Codeine, Contrast media [iodinated contrast media], Fentanyl, Gluten meal, Lactose intolerance (gi), Morphine, Neurontin [gabapentin], Norvasc [amlodipine], Nsaids, Other, and Dilaudid [hydromorphone hcl]    Review of Systems   Review of Systems  All other systems reviewed and are negative.   Physical Exam Updated Vital Signs BP (!) 155/67   Pulse (!) 54   Temp 98 F (36.7 C) (Oral)   Resp 11   Ht 1.575 m ('5\' 2"'$ )   Wt 69.9 kg   SpO2 98%   BMI 28.17 kg/m  Physical Exam Vitals and nursing note reviewed.  Constitutional:      General: She is not in acute distress.    Appearance: She is well-developed.  HENT:     Head: Normocephalic and atraumatic.     Mouth/Throat:     Mouth: Mucous membranes are dry.     Pharynx: No oropharyngeal exudate.  Eyes:     General: No scleral icterus.       Right eye: No discharge.        Left eye: No discharge.     Conjunctiva/sclera: Conjunctivae normal.     Pupils: Pupils are equal, round, and reactive to light.  Neck:     Thyroid: No thyromegaly.     Vascular: No JVD.  Cardiovascular:     Rate and Rhythm: Normal rate and regular rhythm.     Heart sounds: Murmur heard.     No friction rub. No gallop.     Comments: Subtle systolic murmur Pulmonary:     Effort: Pulmonary effort is normal. No respiratory distress.     Breath sounds: Normal breath sounds. No wheezing or rales.  Abdominal:     General: Bowel sounds are normal. There is no distension.     Palpations: Abdomen is soft. There is no mass.     Tenderness: There is no abdominal tenderness.  Musculoskeletal:        General: No tenderness. Normal range of motion.     Cervical back: Normal range of motion and neck supple.     Right lower leg: No edema.     Left lower leg: No edema.  Lymphadenopathy:     Cervical: No cervical adenopathy.  Skin:    General: Skin is warm and dry.     Findings: No erythema or  rash.  Neurological:     Mental Status: She is alert.     Coordination: Coordination normal.  Psychiatric:        Behavior: Behavior normal.     ED Results / Procedures / Treatments   Labs (all labs ordered are listed, but only abnormal results are displayed) Labs Reviewed  BASIC METABOLIC PANEL - Abnormal; Notable for the following components:      Result Value   Glucose, Bld 109 (*)  Creatinine, Ser 2.91 (*)    GFR, Estimated 16 (*)    All other components within normal limits  CBC - Abnormal; Notable for the following components:   WBC 3.7 (*)    Platelets 82 (*)    All other components within normal limits  CBG MONITORING, ED - Abnormal; Notable for the following components:   Glucose-Capillary 113 (*)    All other components within normal limits  RESP PANEL BY RT-PCR (FLU A&B, COVID) ARPGX2  URINALYSIS, ROUTINE W REFLEX MICROSCOPIC  TROPONIN I (HIGH SENSITIVITY)  TROPONIN I (HIGH SENSITIVITY)    EKG None  Radiology DG Chest Portable 1 View  Result Date: 01/12/2022 CLINICAL DATA:  Low blood pressure.  Hemodialysis today. EXAM: PORTABLE CHEST 1 VIEW COMPARISON:  September 19, 2021 FINDINGS: The hemodialysis catheter is stable. No pneumothorax. The heart, hila, mediastinum, lungs, and pleura are normal. No effusions. IMPRESSION: No active disease. Electronically Signed   By: Dorise Bullion III M.D.   On: 01/12/2022 17:11    Procedures Procedures    Medications Ordered in ED Medications - No data to display  ED Course/ Medical Decision Making/ A&P                           Medical Decision Making Amount and/or Complexity of Data Reviewed Labs: ordered. Radiology: ordered.   This patient presents to the ED for concern of chest pain and generalized weakness with an episode of hypotension during dialysis, this involves an extensive number of treatment options, and is a complaint that carries with it a high risk of complications and morbidity.  The differential  diagnosis includes dialysis related hypotension, coronary syndrome, infection,   Co morbidities that complicate the patient evaluation  End-stage renal disease   Additional history obtained:  Additional history obtained from electronic medical record External records from outside source obtained and reviewed including notes from nephrology, vascular, colonoscopy performed approximately 1 month ago, multiple admissions for complications of dialysis and renal failure   Lab Tests:  I Ordered, and personally interpreted labs.  The pertinent results include: Basic metabolic panel shows creatinine of 2.91 but normal electrolytes, 2 normal troponins, CBC without significant anemia though she does have thrombocytopenia.  This is new compared to prior labs in fact when she had blood work done a month ago her platelets were 137,000, today it is 82,000.  COVID-negative flu negative glucose normal chest x-ray normal.   Imaging Studies ordered:  I ordered imaging studies including x-ray I independently visualized and interpreted imaging which showed no acute findings I agree with the radiologist interpretation   Cardiac Monitoring: / EKG:  The patient was maintained on a cardiac monitor.  I personally viewed and interpreted the cardiac monitored which showed an underlying rhythm of: Normal sinus rhythm    Problem List / ED Course / Critical interventions / Medication management  Thrombocytopenia, does not need to be treated acutely, she is not bleeding, 82,000 is reasonable for discharge and follow-up.  Weakness has no obvious answers, hypotension has completely resolved, may have just been related to dialysis I have reviewed the patients home medicines and have made adjustments as needed   Social Determinants of Health:  End-stage renal disease on dialysis   Test / Admission - Considered:  Considered admission but the patient improved significantly, normotensive and  asymptomatic         Final Clinical Impression(s) / ED Diagnoses Final diagnoses:  Hypotension, unspecified hypotension type  End stage renal disease ([place])  Thrombocytopenia ([place])    Rx / DC Orders ED Discharge Orders     None         Noemi Chapel, MD 01/12/22 2015

## 2022-01-12 NOTE — ED Notes (Signed)
Patient is being discharged from the Urgent Care and sent to the Emergency Department via private vehicle . Per PA, patient is in need of higher level of care due to chest pain, left arm pain, dizziness and SOB. Patient is aware and verbalizes understanding of plan of care.  Vitals:   01/12/22 1526  BP: 130/80  Pulse: 60  Resp: 18  Temp: 98.1 F (36.7 C)  SpO2: 96%

## 2022-01-12 NOTE — ED Triage Notes (Signed)
States BP was low at dialysis today and was given fluid while there.  Mid chest pain and left hand and arm pain that started today.  States she feels nauseated and some SOB.

## 2022-01-12 NOTE — ED Triage Notes (Signed)
Pt sent here due to low blood pressure from UC. Pt had hemodialysis done today. Pt had 500 mL at dialysis with nausea and CP, bilateral hand numbness, left arm pain.

## 2022-01-15 DIAGNOSIS — E8779 Other fluid overload: Secondary | ICD-10-CM | POA: Diagnosis not present

## 2022-01-15 DIAGNOSIS — N186 End stage renal disease: Secondary | ICD-10-CM | POA: Diagnosis not present

## 2022-01-15 DIAGNOSIS — Z992 Dependence on renal dialysis: Secondary | ICD-10-CM | POA: Diagnosis not present

## 2022-01-16 DIAGNOSIS — N186 End stage renal disease: Secondary | ICD-10-CM | POA: Diagnosis not present

## 2022-01-16 DIAGNOSIS — E8779 Other fluid overload: Secondary | ICD-10-CM | POA: Diagnosis not present

## 2022-01-16 DIAGNOSIS — Z992 Dependence on renal dialysis: Secondary | ICD-10-CM | POA: Diagnosis not present

## 2022-01-16 NOTE — ED Provider Notes (Signed)
[facility]    CSN: [id number] Arrival date & time: 01/12/22  1519      History   Chief Complaint No chief complaint on file.   HPI Margaret Huerta is a 76 y.o. female.   Presenting today with concern of central/left chest pain, SOB, and variable BPs mainly hypotension during dialysis this morning. Now also becoming nauseated. Denies diaphoresis, blurred vision, mental status changes, vomiting, diarrhea. Not trying anything OTC for sxs. Hx of ESRD on dialysis, anemia, anxiety, GERD, HTN.     Past Medical History:  Diagnosis Date   Anemia    Anxiety    Cervical cancer ([place])    Chronic bronchitis ([place])    "get it q yr"   Chronic lower back pain    Depression    Febrile seizure ([place])    "as a child"   Fibromyalgia    GERD (gastroesophageal reflux disease)    Gout    Hemodialysis patient ([place])    History of blood transfusion    "S/P tonsillectomy"   History of hiatal hernia    Hx of cardiovascular stress test 05/14/2014   false positive Myoview   Hyperlipidemia    Hypertension    Migraine    "monthly" (05/26/2014)   Neuropathy    Osteoporosis    Parathyroid disease ([place])    "my PHT levels run high; I can't take calcium"   Pneumonia "several times"   Renal cancer ([place])    "right"-s/p nephrectomy   Renal insufficiency    "left kidney works at 40-60%" (05/26/2014)   Sleep apnea    "wore mask for 2 months; could not take it" (05/26/2014)   Type II diabetes mellitus ([place])    Typhus fever    "as a child"    Patient Active Problem List   Diagnosis Date Noted   Disorder of peritoneal dialysis catheter 11/07/2021   Mixed hyperlipidemia 11/07/2021   Hypoalbuminemia due to protein-calorie malnutrition ([place]) 11/07/2021   CAD (coronary artery disease) 11/07/2021   Hyperglycemia 11/07/2021   PD catheter dysfunction ([place]) 11/07/2021   Sessile serrated polyp of colon 06/06/2021   History of colonic polyps 06/06/2021   Retinal hemorrhage of right eye  10/28/2019   Left epiretinal membrane 10/28/2019   Controlled diabetes mellitus with moderate nonproliferative retinopathy of left eye, with long-term current use of insulin ([place]) 10/28/2019   Controlled type 2 diabetes mellitus with moderate nonproliferative retinopathy of right eye ([place]) 10/28/2019   Chronic diarrhea 10/27/2019   Nausea with vomiting 10/27/2018   IBIDS syndrome 10/27/2018   Numbness of left hand 12/18/2016   Numbness 06/15/2016   Chronic migraine 04/04/2016   Neck pain 04/04/2016   Other headache syndrome 04/04/2016   Gait disturbance 04/04/2016   Diabetic neuropathy ([place]) 04/04/2016   Subacromial bursitis of left shoulder joint 04/04/2016   Depression 07/20/2014   Syncope 06/29/2014   Morbid obesity ([place]) 06/29/2014   Normal coronary arteries 2003 and 05/26/14 05/27/2014   Family history of coronary artery disease 05/27/2014   H/O renal cell cancer-s/p nephrectomy 05/27/2014   Abnormal stress test (false positive) 05/26/2014   Chronic renal insufficiency 05/26/2014   ALLERGIC RHINITIS 03/09/2010   Dyslipidemia 01/10/2010   Obstructive sleep apnea 01/10/2010   Essential hypertension 01/10/2010    Past Surgical History:  Procedure Laterality Date   ABDOMINAL HERNIA REPAIR  02/2013   "in [place]"   [place]   "partial"   APPENDECTOMY     AV FISTULA PLACEMENT Left 06/13/2021  Procedure: LEFT ARM ARTERIOVENOUS FISTULA CREATION;  Surgeon: Rosetta Posner, MD;  Location: [facility];  Service: Vascular;  Laterality: Left;   BACK SURGERY     BILATERAL SALPINGOOPHORECTOMY Bilateral ~ [place]  2003, April 2016   normal coronaries   CATARACT EXTRACTION W/ INTRAOCULAR LENS  IMPLANT, BILATERAL  2015   COLONOSCOPY N/A 11/25/2014   Procedure: COLONOSCOPY;  Surgeon: Rogene Houston, MD;  Location: [facility];  Service: Endoscopy;  Laterality: N/A;   COLONOSCOPY WITH PROPOFOL N/A 12/12/2021   Procedure: COLONOSCOPY WITH PROPOFOL;   Surgeon: Harvel Quale, MD;  Location: [facility];  Service: Gastroenterology;  Laterality: N/A;  130 ASA 3 patient has dialysis Mon Wed & Fri, per Darius Bump pt knows new arrival time   [place] OF UTERUS     ESOPHAGOGASTRODUODENOSCOPY N/A 11/25/2014   Procedure: ESOPHAGOGASTRODUODENOSCOPY (EGD);  Surgeon: Rogene Houston, MD;  Location: [facility];  Service: Endoscopy;  Laterality: N/A;  9:30 - moved to 11:25 - [name] to notify pt   ESOPHAGOGASTRODUODENOSCOPY (EGD) [place]     GASTRIC BYPASS  2012   HERNIA REPAIR     2015   [place]   INSERTION OF DIALYSIS CATHETER Right 09/19/2021   Procedure: INSERTION OF TUNNELED DIALYSIS CATHETER;  Surgeon: Rosetta Posner, MD;  Location: [facility];  Service: Vascular;  Laterality: Right;   KNEE ARTHROSCOPY Left 2000's   LAPAROSCOPIC CHOLECYSTECTOMY  1990's   LEFT HEART CATHETERIZATION WITH CORONARY ANGIOGRAM N/A 05/27/2014   Procedure: LEFT HEART CATHETERIZATION WITH CORONARY ANGIOGRAM;  Surgeon: Lorretta Harp, MD;  Location: [facility] CATH LAB;  Service: Cardiovascular;  Laterality: N/A;   LIGATION OF ARTERIOVENOUS  FISTULA Left 09/19/2021   Procedure: LIGATION OF ARTERIOVENOUS  FISTULA;  Surgeon: Rosetta Posner, MD;  Location: [facility];  Service: Vascular;  Laterality: Left;   Akaylah Lalley SURGERY  2001   "herniated discs"   [place]   "drilled into gum and put tooth implants uppers"   NEPHRECTOMY Right 2002   "cancer"   POLYPECTOMY  12/12/2021   Procedure: POLYPECTOMY;  Surgeon: Harvel Quale, MD;  Location: [facility];  Service: Gastroenterology;;   [facility]      OB History   No obstetric history on file.      Home Medications    Prior to Admission medications   Medication Sig Start  Date End Date Taking? Authorizing Provider  acetaminophen (TYLENOL) 500 MG tablet Take 1,000 mg by mouth every 6 (six) hours as needed (for pain.).    [provider]  allopurinol (ZYLOPRIM) 300 MG tablet Take 300 mg by mouth in the morning. 05/09/17   [provider]  ALPRAZolam Duanne Moron) 0.25 MG tablet Take 0.25 mg by mouth every Monday, Wednesday, and Friday with hemodialysis. 05/23/21   [provider]  aspirin EC 81 MG tablet Take 81 mg by mouth in the morning. Swallow whole.    [provider]  atenolol (TENORMIN) 50 MG tablet Take 50 mg by mouth 2 (two) times daily.    [provider]  calcitRIOL (ROCALTROL) 0.25 MCG capsule Take 0.25 mcg by mouth in the morning.    [provider]  Cholecalciferol (  VITAMIN D3) 250 MCG (10000 UT) TABS Take 10,000 Units by mouth in the morning.    [provider]  docusate sodium (COLACE) 50 MG capsule Take 50 mg by mouth at bedtime.    [provider]  DULoxetine (CYMBALTA) 60 MG capsule Take 60 mg by mouth in the morning.    [provider]  furosemide (LASIX) 40 MG tablet Take 40 mg by mouth 2 (two) times daily.    [provider]  hydrALAZINE (APRESOLINE) 50 MG tablet Take 100 mg by mouth in the morning, at noon, and at bedtime. 02/28/21   [provider]  isosorbide dinitrate (ISORDIL) 10 MG tablet Take 1 tablet (10 mg total) by mouth 2 (two) times daily. 07/21/21   Arnoldo Lenis, MD  lidocaine-prilocaine (EMLA) cream Apply 1 Application topically as needed (when have a port). 09/13/21   [provider]  NITROSTAT 0.4 MG SL tablet Place 1 tablet (0.4 mg total) under the tongue as needed. 07/21/21   Arnoldo Lenis, MD  rosuvastatin (CRESTOR) 10 MG tablet Take 1 tablet (10 mg total) by mouth daily. 12/01/19   Verta Ellen., NP  sevelamer carbonate (RENVELA) 800 MG tablet Take 800 mg by mouth 3 (three) times daily. 09/13/21   [provider]   sodium bicarbonate 650 MG tablet Take 650 mg by mouth 2 (two) times daily. 04/15/21   [provider]    Family History Family History  Problem Relation Age of Onset   Emphysema Father    Heart disease Father    Clotting disorder Mother    Diabetes Mother    Kidney disease Mother    Allergies Other        whole family per pt   Lung cancer Maternal Uncle    Alcohol abuse Maternal Uncle     Social History Social History   Tobacco Use   Smoking status: Never    Passive exposure: Never   Smokeless tobacco: Never  Vaping Use   Vaping Use: Never used  Substance Use Topics   Alcohol use: No    Alcohol/week: 0.0 standard drinks of alcohol   Drug use: No     Allergies   Demerol [meperidine], Lyrica [pregabalin], Vicodin [hydrocodone-acetaminophen], Ace inhibitors, Aleve [naproxen], Codeine, Contrast media [iodinated contrast media], Fentanyl, Gluten meal, Lactose intolerance (gi), Morphine, Neurontin [gabapentin], Norvasc [amlodipine], Nsaids, Other, and Dilaudid [hydromorphone hcl]   Review of Systems Review of Systems PER HPI  Physical Exam Triage Vital Signs ED Triage Vitals  Enc Vitals Group     BP 01/12/22 1526 130/80     Pulse Rate 01/12/22 1526 60     Resp 01/12/22 1526 18     Temp 01/12/22 1526 98.1 F (36.7 C)     Temp Source 01/12/22 1526 Oral     SpO2 01/12/22 1526 96 %     Weight --      Height --      Head Circumference --      Peak Flow --      Pain Score 01/12/22 1529 3     Pain Loc --      Pain Edu? --      Excl. in [place]? --    No data found.  Updated Vital Signs BP 130/80 (BP Location: Right Arm)   Pulse 60   Temp 98.1 F (36.7 C) (Oral)   Resp 18   SpO2 96%   Visual Acuity Right Eye Distance:   Left Eye  Distance:   Bilateral Distance:    Right Eye Near:   Left Eye Near:    Bilateral Near:     Physical Exam Vitals and nursing note reviewed.  Constitutional:      Appearance: Normal appearance. She is not ill-appearing.   HENT:     Head: Atraumatic.  Eyes:     Extraocular Movements: Extraocular movements intact.     Conjunctiva/sclera: Conjunctivae normal.  Cardiovascular:     Rate and Rhythm: Normal rate and regular rhythm.     Heart sounds: Normal heart sounds.  Pulmonary:     Effort: Pulmonary effort is normal.     Breath sounds: Normal breath sounds.  Musculoskeletal:        General: Normal range of motion.     Cervical back: Normal range of motion and neck supple.  Skin:    General: Skin is warm and dry.  Neurological:     Mental Status: She is alert and oriented to person, place, and time.  Psychiatric:        Mood and Affect: Mood normal.        Thought Content: Thought content normal.        Judgment: Judgment normal.      UC Treatments / Results  Labs (all labs ordered are listed, but only abnormal results are displayed) Labs Reviewed - No data to display  EKG   Radiology No results found.  Procedures Procedures (including critical care time)  Medications Ordered in UC Medications - No data to display  Initial Impression / Assessment and Plan / UC Course  I have reviewed the triage vital signs and the nursing notes.  Pertinent labs & imaging results that were available during my care of the patient were reviewed by me and considered in my medical decision making (see chart for details).     Vitals and exam overall reassuring but given nature of complaints, complicated medical history recommend further evaluation and r/o in the ED. She is agreeable and her significant other plans to transport private vehicle.  Final Clinical Impressions(s) / UC Diagnoses   Final diagnoses:  Chest pain, unspecified type  SOB (shortness of breath)  Left arm pain   Discharge Instructions   None    ED Prescriptions   None    PDMP not reviewed this encounter.   Volney American, [place] 01/16/22 2303

## 2022-01-17 DIAGNOSIS — Z992 Dependence on renal dialysis: Secondary | ICD-10-CM | POA: Diagnosis not present

## 2022-01-17 DIAGNOSIS — E8779 Other fluid overload: Secondary | ICD-10-CM | POA: Diagnosis not present

## 2022-01-17 DIAGNOSIS — N186 End stage renal disease: Secondary | ICD-10-CM | POA: Diagnosis not present

## 2022-01-18 ENCOUNTER — Ambulatory Visit (HOSPITAL_COMMUNITY)
Admission: RE | Admit: 2022-01-18 | Discharge: 2022-01-18 | Disposition: A | Discharge status: Home / Self Care | Payer: Medicare Other | Source: Ambulatory Visit | Attending: Internal Medicine | Admitting: Internal Medicine

## 2022-01-18 DIAGNOSIS — E8779 Other fluid overload: Secondary | ICD-10-CM | POA: Diagnosis not present

## 2022-01-18 DIAGNOSIS — Z1231 Encounter for screening mammogram for malignant neoplasm of breast: Secondary | ICD-10-CM | POA: Diagnosis not present

## 2022-01-18 DIAGNOSIS — Z992 Dependence on renal dialysis: Secondary | ICD-10-CM | POA: Diagnosis not present

## 2022-01-18 DIAGNOSIS — N186 End stage renal disease: Secondary | ICD-10-CM | POA: Diagnosis not present

## 2022-01-19 DIAGNOSIS — N2581 Secondary hyperparathyroidism of renal origin: Secondary | ICD-10-CM | POA: Diagnosis not present

## 2022-01-19 DIAGNOSIS — N186 End stage renal disease: Secondary | ICD-10-CM | POA: Diagnosis not present

## 2022-01-19 DIAGNOSIS — E8779 Other fluid overload: Secondary | ICD-10-CM | POA: Diagnosis not present

## 2022-01-19 DIAGNOSIS — Z992 Dependence on renal dialysis: Secondary | ICD-10-CM | POA: Diagnosis not present

## 2022-01-22 DIAGNOSIS — N2581 Secondary hyperparathyroidism of renal origin: Secondary | ICD-10-CM | POA: Diagnosis not present

## 2022-01-22 DIAGNOSIS — E8779 Other fluid overload: Secondary | ICD-10-CM | POA: Diagnosis not present

## 2022-01-22 DIAGNOSIS — Z992 Dependence on renal dialysis: Secondary | ICD-10-CM | POA: Diagnosis not present

## 2022-01-22 DIAGNOSIS — N186 End stage renal disease: Secondary | ICD-10-CM | POA: Diagnosis not present

## 2022-01-23 DIAGNOSIS — N2581 Secondary hyperparathyroidism of renal origin: Secondary | ICD-10-CM | POA: Diagnosis not present

## 2022-01-23 DIAGNOSIS — E8779 Other fluid overload: Secondary | ICD-10-CM | POA: Diagnosis not present

## 2022-01-23 DIAGNOSIS — Z992 Dependence on renal dialysis: Secondary | ICD-10-CM | POA: Diagnosis not present

## 2022-01-23 DIAGNOSIS — N186 End stage renal disease: Secondary | ICD-10-CM | POA: Diagnosis not present

## 2022-01-24 DIAGNOSIS — N2581 Secondary hyperparathyroidism of renal origin: Secondary | ICD-10-CM | POA: Diagnosis not present

## 2022-01-24 DIAGNOSIS — N186 End stage renal disease: Secondary | ICD-10-CM | POA: Diagnosis not present

## 2022-01-24 DIAGNOSIS — E8779 Other fluid overload: Secondary | ICD-10-CM | POA: Diagnosis not present

## 2022-01-24 DIAGNOSIS — Z992 Dependence on renal dialysis: Secondary | ICD-10-CM | POA: Diagnosis not present

## 2022-01-25 DIAGNOSIS — E8779 Other fluid overload: Secondary | ICD-10-CM | POA: Diagnosis not present

## 2022-01-25 DIAGNOSIS — N186 End stage renal disease: Secondary | ICD-10-CM | POA: Diagnosis not present

## 2022-01-25 DIAGNOSIS — N2581 Secondary hyperparathyroidism of renal origin: Secondary | ICD-10-CM | POA: Diagnosis not present

## 2022-01-25 DIAGNOSIS — Z992 Dependence on renal dialysis: Secondary | ICD-10-CM | POA: Diagnosis not present

## 2022-01-26 DIAGNOSIS — Z992 Dependence on renal dialysis: Secondary | ICD-10-CM | POA: Diagnosis not present

## 2022-01-26 DIAGNOSIS — N186 End stage renal disease: Secondary | ICD-10-CM | POA: Diagnosis not present

## 2022-01-26 DIAGNOSIS — E8779 Other fluid overload: Secondary | ICD-10-CM | POA: Diagnosis not present

## 2022-01-26 DIAGNOSIS — N2581 Secondary hyperparathyroidism of renal origin: Secondary | ICD-10-CM | POA: Diagnosis not present

## 2022-01-29 DIAGNOSIS — N186 End stage renal disease: Secondary | ICD-10-CM | POA: Diagnosis not present

## 2022-01-29 DIAGNOSIS — Z992 Dependence on renal dialysis: Secondary | ICD-10-CM | POA: Diagnosis not present

## 2022-01-29 DIAGNOSIS — N2581 Secondary hyperparathyroidism of renal origin: Secondary | ICD-10-CM | POA: Diagnosis not present

## 2022-01-29 DIAGNOSIS — E8779 Other fluid overload: Secondary | ICD-10-CM | POA: Diagnosis not present

## 2022-01-30 DIAGNOSIS — Z992 Dependence on renal dialysis: Secondary | ICD-10-CM | POA: Diagnosis not present

## 2022-01-30 DIAGNOSIS — N2581 Secondary hyperparathyroidism of renal origin: Secondary | ICD-10-CM | POA: Diagnosis not present

## 2022-01-30 DIAGNOSIS — E8779 Other fluid overload: Secondary | ICD-10-CM | POA: Diagnosis not present

## 2022-01-30 DIAGNOSIS — N186 End stage renal disease: Secondary | ICD-10-CM | POA: Diagnosis not present

## 2022-01-31 ENCOUNTER — Encounter: Payer: Self-pay | Admitting: Cardiology

## 2022-01-31 ENCOUNTER — Ambulatory Visit: Payer: Medicare Other | Attending: Cardiology | Admitting: Cardiology

## 2022-01-31 VITALS — BP 120/70 | HR 54 | Ht 62.0 in | Wt 158.8 lb

## 2022-01-31 DIAGNOSIS — Z992 Dependence on renal dialysis: Secondary | ICD-10-CM | POA: Diagnosis not present

## 2022-01-31 DIAGNOSIS — I10 Essential (primary) hypertension: Secondary | ICD-10-CM | POA: Diagnosis not present

## 2022-01-31 DIAGNOSIS — N186 End stage renal disease: Secondary | ICD-10-CM | POA: Diagnosis not present

## 2022-01-31 DIAGNOSIS — R0789 Other chest pain: Secondary | ICD-10-CM

## 2022-01-31 DIAGNOSIS — N2581 Secondary hyperparathyroidism of renal origin: Secondary | ICD-10-CM | POA: Diagnosis not present

## 2022-01-31 DIAGNOSIS — G4733 Obstructive sleep apnea (adult) (pediatric): Secondary | ICD-10-CM

## 2022-01-31 DIAGNOSIS — E8779 Other fluid overload: Secondary | ICD-10-CM | POA: Diagnosis not present

## 2022-01-31 DIAGNOSIS — E782 Mixed hyperlipidemia: Secondary | ICD-10-CM | POA: Diagnosis not present

## 2022-01-31 MED ORDER — HYDRALAZINE HCL 50 MG PO TABS: 75.0000 mg | ORAL_TABLET | Freq: Three times a day (TID) | ORAL | 3 refills | Status: DC

## 2022-01-31 NOTE — Progress Notes (Signed)
Clinical Summary Ms. Kain is a 76 y.o.female seen today for follow up of the following medical problems.      1. History of chest pain Patient had left heart cath in 2003 (a J.  Ganji) and in 2016.  For abnormal stress test.  This both showed normal coronary arteries   07/2019 mild ischemia, low risk - no recent symptoms - previously has responved to SL NG, has done well since starting isordil.       - some chest pains at times. Often after HD, improves with SL NG.    2. HTN Compliant with meds - has had chronic issues with orthostatic hypotension, appear somewhat worsened since starting dialysis - low bp's at times, some orthostatic symptoms.    3. Hyperlipidemia - labs followed by pcp - she is on crestor 08/2021 TC 127 TG 121 HDL 53 LDL 53         4. ESRD - Dr [name] follows - left sided fistula developed steal syndrome. - doing home peritoneal HD. Low bp's at times to 80s to 90s with standing      4. DM2   5. OSA  - not comfortable with cpap, not using.    6. Orthostatic syncope -  chronic orthostatic dizzienss at times      SH: husband diagnosed colon cancer, due for surgery this month June 2023 Past Medical History:  Diagnosis Date   Anemia    Anxiety    Cervical cancer ([place])    Chronic bronchitis ([place])    "get it q yr"   Chronic lower back pain    Depression    Febrile seizure ([place])    "as a child"   Fibromyalgia    GERD (gastroesophageal reflux disease)    Gout    Hemodialysis patient ([place])    History of blood transfusion    "S/P tonsillectomy"   History of hiatal hernia    Hx of cardiovascular stress test 05/14/2014   false positive Myoview   Hyperlipidemia    Hypertension    Migraine    "monthly" (05/26/2014)   Neuropathy    Osteoporosis    Parathyroid disease ([place])    "my PHT levels run high; I can't take calcium"   Pneumonia "several times"   Renal cancer ([place])    "right"-s/p nephrectomy   Renal insufficiency    "left  kidney works at 40-60%" (05/26/2014)   Sleep apnea    "wore mask for 2 months; could not take it" (05/26/2014)   Type II diabetes mellitus ([place])    Typhus fever    "as a child"     Allergies  Allergen Reactions   Demerol [Meperidine] Other (See Comments)    Stop breathing   Lyrica [Pregabalin] Swelling    Stomach lesion   Vicodin [Hydrocodone-Acetaminophen] Other (See Comments)    Stop breathing   Ace Inhibitors Cough   Aleve [Naproxen] Other (See Comments)    stomach lesion   Codeine Other (See Comments)    hallucination, confused   Contrast Media [Iodinated Contrast Media] Nausea Only and Other (See Comments)    MRI dye. Shaky, nausea   Fentanyl Itching   Gluten Meal Diarrhea    GI-upset   Lactose Intolerance (Gi) Other (See Comments)    Gi upset   Morphine Nausea And Vomiting   Neurontin [Gabapentin] Nausea Only, Swelling and Other (See Comments)    Confusion. Felt like in a dream. foggy   Norvasc [Amlodipine] Swelling  Nsaids Other (See Comments)    Severe chronic kidney disease   Other Other (See Comments)    Anesthesia--prolonged sleep inertia (grogginess/disorientation/cognitive impairment)   Dilaudid [Hydromorphone Hcl] Itching, Nausea And Vomiting and Rash     Current Outpatient Medications  Medication Sig Dispense Refill   acetaminophen (TYLENOL) 500 MG tablet Take 1,000 mg by mouth every 6 (six) hours as needed (for pain.).     allopurinol (ZYLOPRIM) 300 MG tablet Take 300 mg by mouth in the morning.  12   ALPRAZolam (XANAX) 0.25 MG tablet Take 0.25 mg by mouth every Monday, Wednesday, and Friday with hemodialysis.     aspirin EC 81 MG tablet Take 81 mg by mouth in the morning. Swallow whole.     atenolol (TENORMIN) 50 MG tablet Take 50 mg by mouth 2 (two) times daily.     calcitRIOL (ROCALTROL) 0.25 MCG capsule Take 0.25 mcg by mouth in the morning.     Cholecalciferol (VITAMIN D3) 250 MCG (10000 UT) TABS Take 10,000 Units by mouth in the morning.      docusate sodium (COLACE) 50 MG capsule Take 50 mg by mouth at bedtime.     DULoxetine (CYMBALTA) 60 MG capsule Take 60 mg by mouth in the morning.     furosemide (LASIX) 40 MG tablet Take 40 mg by mouth 2 (two) times daily.     hydrALAZINE (APRESOLINE) 50 MG tablet Take 100 mg by mouth in the morning, at noon, and at bedtime.     isosorbide dinitrate (ISORDIL) 10 MG tablet Take 1 tablet (10 mg total) by mouth 2 (two) times daily. 180 tablet 3   lidocaine-prilocaine (EMLA) cream Apply 1 Application topically as needed (when have a port).     NITROSTAT 0.4 MG SL tablet Place 1 tablet (0.4 mg total) under the tongue as needed. 25 tablet 3   rosuvastatin (CRESTOR) 10 MG tablet Take 1 tablet (10 mg total) by mouth daily.     sevelamer carbonate (RENVELA) 800 MG tablet Take 800 mg by mouth 3 (three) times daily.     sodium bicarbonate 650 MG tablet Take 650 mg by mouth 2 (two) times daily.     No current facility-administered medications for this visit.     Past Surgical History:  Procedure Laterality Date   ABDOMINAL HERNIA REPAIR  02/2013   "in [place]"   [place]   "partial"   APPENDECTOMY     AV FISTULA PLACEMENT Left 06/13/2021   Procedure: LEFT ARM ARTERIOVENOUS FISTULA CREATION;  Surgeon: Rosetta Posner, MD;  Location: [facility];  Service: Vascular;  Laterality: Left;   BACK SURGERY     BILATERAL SALPINGOOPHORECTOMY Bilateral ~ [place]  2003, April 2016   normal coronaries   CATARACT EXTRACTION W/ INTRAOCULAR LENS  IMPLANT, BILATERAL  2015   COLONOSCOPY N/A 11/25/2014   Procedure: COLONOSCOPY;  Surgeon: Rogene Houston, MD;  [facility];  Service: Endoscopy;  Laterality: N/A;   COLONOSCOPY WITH PROPOFOL N/A 12/12/2021   Procedure: COLONOSCOPY WITH PROPOFOL;  Surgeon: Harvel Quale, MD;  Location: [facility];  Service: Gastroenterology;  Laterality: N/A;  130 ASA 3 patient has dialysis Mon Wed & Fri, per Darius Bump pt  knows new arrival time   [place] OF UTERUS     ESOPHAGOGASTRODUODENOSCOPY N/A 11/25/2014   Procedure: ESOPHAGOGASTRODUODENOSCOPY (EGD);  Surgeon: Rogene Houston,  MD;  [facility];  Service: Endoscopy;  Laterality: N/A;  9:30 - moved to 11:25 - [name] to notify pt   ESOPHAGOGASTRODUODENOSCOPY (EGD) [place]     GASTRIC BYPASS  2012   HERNIA REPAIR     2015   [place]   INSERTION OF DIALYSIS CATHETER Right 09/19/2021   Procedure: INSERTION OF TUNNELED DIALYSIS CATHETER;  Surgeon: Rosetta Posner, MD;  Location: [facility];  Service: Vascular;  Laterality: Right;   KNEE ARTHROSCOPY Left 2000's   LAPAROSCOPIC CHOLECYSTECTOMY  1990's   LEFT HEART CATHETERIZATION WITH CORONARY ANGIOGRAM N/A 05/27/2014   Procedure: LEFT HEART CATHETERIZATION WITH CORONARY ANGIOGRAM;  Surgeon: Lorretta Harp, MD;  Location: [facility] CATH LAB;  Service: Cardiovascular;  Laterality: N/A;   LIGATION OF ARTERIOVENOUS  FISTULA Left 09/19/2021   Procedure: LIGATION OF ARTERIOVENOUS  FISTULA;  Surgeon: Rosetta Posner, MD;  Location: [facility];  Service: Vascular;  Laterality: Left;   [place] SURGERY  2001   "herniated discs"   [place]   "drilled into gum and put tooth implants uppers"   NEPHRECTOMY Right 2002   "cancer"   POLYPECTOMY  12/12/2021   Procedure: POLYPECTOMY;  Surgeon: Harvel Quale, MD;  Location[facility];  Service: Gastroenterology;;   TMJ ARTHROPLASTY  1988   TONSILLECTOMY  1976   [facility] GI ENDOSCOPY       Allergies  Allergen Reactions   Demerol [Meperidine] Other (See Comments)    Stop breathing   Lyrica [Pregabalin] Swelling    Stomach lesion   Vicodin [Hydrocodone-Acetaminophen] Other (See Comments)    Stop breathing   Ace Inhibitors Cough   Aleve [Naproxen] Other (See Comments)    stomach  lesion   Codeine Other (See Comments)    hallucination, confused   Contrast Media [Iodinated Contrast Media] Nausea Only and Other (See Comments)    MRI dye. Shaky, nausea   Fentanyl Itching   Gluten Meal Diarrhea    GI-upset   Lactose Intolerance (Gi) Other (See Comments)    Gi upset   Morphine Nausea And Vomiting   Neurontin [Gabapentin] Nausea Only, Swelling and Other (See Comments)    Confusion. Felt like in a dream. foggy   Norvasc [Amlodipine] Swelling   Nsaids Other (See Comments)    Severe chronic kidney disease   Other Other (See Comments)    Anesthesia--prolonged sleep inertia (grogginess/disorientation/cognitive impairment)   Dilaudid [Hydromorphone Hcl] Itching, Nausea And Vomiting and Rash      Family History  Problem Relation Age of Onset   Emphysema Father    Heart disease Father    Clotting disorder Mother    Diabetes Mother    Kidney disease Mother    Allergies Other        whole family per pt   Lung cancer Maternal Uncle    Alcohol abuse Maternal Uncle      Social History Ms. Fundora reports that she has never smoked. She has never been exposed to tobacco smoke. She has never used smokeless tobacco. Ms. Bellissimo reports no history of alcohol use.   Review of Systems CONSTITUTIONAL: No weight loss, fever, chills, weakness or fatigue.  HEENT: Eyes: No visual loss, blurred vision, double vision or yellow sclerae.No hearing loss, sneezing, congestion, runny nose or sore throat.  SKIN: No rash or itching.  CARDIOVASCULAR: per hpi RESPIRATORY: No shortness of breath, cough  or sputum.  GASTROINTESTINAL: No anorexia, nausea, vomiting or diarrhea. No abdominal pain or blood.  GENITOURINARY: No burning on urination, no polyuria NEUROLOGICAL: No headache, dizziness, syncope, paralysis, ataxia, numbness or tingling in the extremities. No change in bowel or bladder control.  MUSCULOSKELETAL: No muscle, back pain, joint pain or stiffness.  LYMPHATICS: No enlarged  nodes. No history of splenectomy.  PSYCHIATRIC: No history of depression or anxiety.  ENDOCRINOLOGIC: No reports of sweating, cold or heat intolerance. No polyuria or polydipsia.  Marland Kitchen   Physical Examination Today's Vitals   01/31/22 1122  BP: 120/70  Pulse: (!) 54  SpO2: 98%  Weight: 158 lb 12.8 oz (72 kg)  Height: '5\' 2"'$  (1.575 m)   Body mass index is 29.04 kg/m.  Gen: resting comfortably, no acute distress HEENT: no scleral icterus, pupils equal round and reactive, no palptable cervical adenopathy,  CV: RRR, no m/rg, no jvd Resp: Clear to auscultation bilaterally GI: abdomen is soft, non-tender, non-distended, normal bowel sounds, no hepatosplenomegaly MSK: extremities are warm, no edema.  Skin: warm, no rash Neuro:  no focal deficits Psych: appropriate affect   Diagnostic Studies  Nuclear stress test 08/05/2019 There was no ST segment deviation noted during stress. Findings consistent with mild apical ischemia. This is a low risk study. The left ventricular ejection fraction is hyperdynamic (>65%).   Echo 08/05/19 . Left ventricular ejection fraction, by estimation, is 60 to 65%. The  left ventricle has normal function. The left ventricle has no regional  wall motion abnormalities. There is mild left ventricular hypertrophy.  Left ventricular diastolic parameters  are consistent with Grade I diastolic dysfunction (impaired relaxation).   2. Right ventricular systolic function is normal. The right ventricular  size is normal. There is mildly elevated pulmonary artery systolic  pressure.   3. Left atrial size was severely dilated.   4. Right atrial size was mildly dilated.   5. The mitral valve is normal in structure. Mild mitral valve  regurgitation. No evidence of mitral stenosis.   6. The aortic valve is tricuspid. Aortic valve regurgitation is not  visualized. No aortic stenosis is present.   7. The inferior vena cava is normal in size with greater than 50%   respiratory variability, suggesting right atrial pressure of 3 mmHg.   8. Left to right atrial shunting noted by color Doppler, probable PFO.      11/2019 renal artery [place] Summary:  Largest Aortic Diameter: 2.2 cm     Renal:     Right: Right kidney removed 2002.  Left:  Normal size of left kidney. Abnormal left Resisitve Index.         Normal cortical thickness of the left kidney. No evidence of         left renal artery stenosis. LRV flow present.  Mesenteric:  Normal Celiac artery and Superior Mesenteric artery findings.    Assessment and Plan   1. Chest pain - long history of symptoms with overall benign ischemic testing, most recently 07/2019 -has improved in general on nitrates. Some recent symptoms particularly during HD, may try increasing isordil in near future pending pattern of her ongoing orthostatic symptoms   2. HTN -at goal. Chronic issues with orthostatic hypotension, seems more promiineny since starting HD - lower hydralazine to '75mg'$  tid and monitor orthostatilc symptoms, likely will need to tolerate high bp's in genelar   3. Hyperlipidemia - at goal, continue current medes  4. OSA - refer to pulmonary   Arnoldo Lenis, M.D.

## 2022-01-31 NOTE — Patient Instructions (Addendum)
Medication Instructions:  Your physician has recommended you make the following change in your medication:  Decrease hydralazine to 75 mg three times daily Continue other medications the same  Labwork: none  Testing/Procedures: none  Follow-Up: Your physician recommends that you schedule a follow-up appointment in: 3 months (phone visit)  Any Other Special Instructions Will Be Listed Below (If Applicable). You have been referred to [facility] Pulmonology  If you need a refill on your cardiac medications before your next appointment, please call your pharmacy.

## 2022-02-01 DIAGNOSIS — N2581 Secondary hyperparathyroidism of renal origin: Secondary | ICD-10-CM | POA: Diagnosis not present

## 2022-02-01 DIAGNOSIS — N186 End stage renal disease: Secondary | ICD-10-CM | POA: Diagnosis not present

## 2022-02-01 DIAGNOSIS — E8779 Other fluid overload: Secondary | ICD-10-CM | POA: Diagnosis not present

## 2022-02-01 DIAGNOSIS — Z992 Dependence on renal dialysis: Secondary | ICD-10-CM | POA: Diagnosis not present

## 2022-02-02 DIAGNOSIS — N2581 Secondary hyperparathyroidism of renal origin: Secondary | ICD-10-CM | POA: Diagnosis not present

## 2022-02-02 DIAGNOSIS — E8779 Other fluid overload: Secondary | ICD-10-CM | POA: Diagnosis not present

## 2022-02-02 DIAGNOSIS — N186 End stage renal disease: Secondary | ICD-10-CM | POA: Diagnosis not present

## 2022-02-02 DIAGNOSIS — Z992 Dependence on renal dialysis: Secondary | ICD-10-CM | POA: Diagnosis not present

## 2022-02-03 DIAGNOSIS — E8779 Other fluid overload: Secondary | ICD-10-CM | POA: Diagnosis not present

## 2022-02-03 DIAGNOSIS — N2581 Secondary hyperparathyroidism of renal origin: Secondary | ICD-10-CM | POA: Diagnosis not present

## 2022-02-03 DIAGNOSIS — N186 End stage renal disease: Secondary | ICD-10-CM | POA: Diagnosis not present

## 2022-02-03 DIAGNOSIS — Z992 Dependence on renal dialysis: Secondary | ICD-10-CM | POA: Diagnosis not present

## 2022-02-05 DIAGNOSIS — E8779 Other fluid overload: Secondary | ICD-10-CM | POA: Diagnosis not present

## 2022-02-05 DIAGNOSIS — N2581 Secondary hyperparathyroidism of renal origin: Secondary | ICD-10-CM | POA: Diagnosis not present

## 2022-02-05 DIAGNOSIS — N186 End stage renal disease: Secondary | ICD-10-CM | POA: Diagnosis not present

## 2022-02-05 DIAGNOSIS — Z992 Dependence on renal dialysis: Secondary | ICD-10-CM | POA: Diagnosis not present

## 2022-02-06 DIAGNOSIS — Z992 Dependence on renal dialysis: Secondary | ICD-10-CM | POA: Diagnosis not present

## 2022-02-06 DIAGNOSIS — E8779 Other fluid overload: Secondary | ICD-10-CM | POA: Diagnosis not present

## 2022-02-06 DIAGNOSIS — N2581 Secondary hyperparathyroidism of renal origin: Secondary | ICD-10-CM | POA: Diagnosis not present

## 2022-02-06 DIAGNOSIS — N186 End stage renal disease: Secondary | ICD-10-CM | POA: Diagnosis not present

## 2022-02-07 DIAGNOSIS — E8779 Other fluid overload: Secondary | ICD-10-CM | POA: Diagnosis not present

## 2022-02-07 DIAGNOSIS — N186 End stage renal disease: Secondary | ICD-10-CM | POA: Diagnosis not present

## 2022-02-07 DIAGNOSIS — N2581 Secondary hyperparathyroidism of renal origin: Secondary | ICD-10-CM | POA: Diagnosis not present

## 2022-02-07 DIAGNOSIS — Z992 Dependence on renal dialysis: Secondary | ICD-10-CM | POA: Diagnosis not present

## 2022-02-08 DIAGNOSIS — Z992 Dependence on renal dialysis: Secondary | ICD-10-CM | POA: Diagnosis not present

## 2022-02-08 DIAGNOSIS — N2581 Secondary hyperparathyroidism of renal origin: Secondary | ICD-10-CM | POA: Diagnosis not present

## 2022-02-08 DIAGNOSIS — E8779 Other fluid overload: Secondary | ICD-10-CM | POA: Diagnosis not present

## 2022-02-08 DIAGNOSIS — N186 End stage renal disease: Secondary | ICD-10-CM | POA: Diagnosis not present

## 2022-02-09 DIAGNOSIS — N186 End stage renal disease: Secondary | ICD-10-CM | POA: Diagnosis not present

## 2022-02-09 DIAGNOSIS — Z992 Dependence on renal dialysis: Secondary | ICD-10-CM | POA: Diagnosis not present

## 2022-02-09 DIAGNOSIS — E8779 Other fluid overload: Secondary | ICD-10-CM | POA: Diagnosis not present

## 2022-02-09 DIAGNOSIS — N2581 Secondary hyperparathyroidism of renal origin: Secondary | ICD-10-CM | POA: Diagnosis not present

## 2022-02-11 DIAGNOSIS — Z992 Dependence on renal dialysis: Secondary | ICD-10-CM | POA: Diagnosis not present

## 2022-02-11 DIAGNOSIS — K219 Gastro-esophageal reflux disease without esophagitis: Secondary | ICD-10-CM | POA: Diagnosis not present

## 2022-02-11 DIAGNOSIS — E1129 Type 2 diabetes mellitus with other diabetic kidney complication: Secondary | ICD-10-CM | POA: Diagnosis not present

## 2022-02-11 DIAGNOSIS — N186 End stage renal disease: Secondary | ICD-10-CM | POA: Diagnosis not present

## 2022-02-11 DIAGNOSIS — I10 Essential (primary) hypertension: Secondary | ICD-10-CM | POA: Diagnosis not present

## 2022-02-12 DIAGNOSIS — N186 End stage renal disease: Secondary | ICD-10-CM | POA: Diagnosis not present

## 2022-02-12 DIAGNOSIS — E8779 Other fluid overload: Secondary | ICD-10-CM | POA: Diagnosis not present

## 2022-02-12 DIAGNOSIS — Z992 Dependence on renal dialysis: Secondary | ICD-10-CM | POA: Diagnosis not present

## 2022-02-13 DIAGNOSIS — E8779 Other fluid overload: Secondary | ICD-10-CM | POA: Diagnosis not present

## 2022-02-13 DIAGNOSIS — N186 End stage renal disease: Secondary | ICD-10-CM | POA: Diagnosis not present

## 2022-02-13 DIAGNOSIS — Z992 Dependence on renal dialysis: Secondary | ICD-10-CM | POA: Diagnosis not present

## 2022-02-14 DIAGNOSIS — N186 End stage renal disease: Secondary | ICD-10-CM | POA: Diagnosis not present

## 2022-02-14 DIAGNOSIS — E1122 Type 2 diabetes mellitus with diabetic chronic kidney disease: Secondary | ICD-10-CM | POA: Diagnosis not present

## 2022-02-14 DIAGNOSIS — Z992 Dependence on renal dialysis: Secondary | ICD-10-CM | POA: Diagnosis not present

## 2022-02-14 DIAGNOSIS — E8779 Other fluid overload: Secondary | ICD-10-CM | POA: Diagnosis not present

## 2022-02-15 DIAGNOSIS — N186 End stage renal disease: Secondary | ICD-10-CM | POA: Diagnosis not present

## 2022-02-15 DIAGNOSIS — Z992 Dependence on renal dialysis: Secondary | ICD-10-CM | POA: Diagnosis not present

## 2022-02-15 DIAGNOSIS — E8779 Other fluid overload: Secondary | ICD-10-CM | POA: Diagnosis not present

## 2022-02-16 DIAGNOSIS — Z992 Dependence on renal dialysis: Secondary | ICD-10-CM | POA: Diagnosis not present

## 2022-02-16 DIAGNOSIS — N186 End stage renal disease: Secondary | ICD-10-CM | POA: Diagnosis not present

## 2022-02-16 DIAGNOSIS — E8779 Other fluid overload: Secondary | ICD-10-CM | POA: Diagnosis not present

## 2022-02-17 DIAGNOSIS — E8779 Other fluid overload: Secondary | ICD-10-CM | POA: Diagnosis not present

## 2022-02-17 DIAGNOSIS — N186 End stage renal disease: Secondary | ICD-10-CM | POA: Diagnosis not present

## 2022-02-17 DIAGNOSIS — Z992 Dependence on renal dialysis: Secondary | ICD-10-CM | POA: Diagnosis not present

## 2022-02-18 DIAGNOSIS — N186 End stage renal disease: Secondary | ICD-10-CM | POA: Diagnosis not present

## 2022-02-18 DIAGNOSIS — E8779 Other fluid overload: Secondary | ICD-10-CM | POA: Diagnosis not present

## 2022-02-18 DIAGNOSIS — Z992 Dependence on renal dialysis: Secondary | ICD-10-CM | POA: Diagnosis not present

## 2022-02-20 DIAGNOSIS — Z992 Dependence on renal dialysis: Secondary | ICD-10-CM | POA: Diagnosis not present

## 2022-02-20 DIAGNOSIS — N186 End stage renal disease: Secondary | ICD-10-CM | POA: Diagnosis not present

## 2022-02-20 DIAGNOSIS — E8779 Other fluid overload: Secondary | ICD-10-CM | POA: Diagnosis not present

## 2022-02-20 DIAGNOSIS — E1129 Type 2 diabetes mellitus with other diabetic kidney complication: Secondary | ICD-10-CM | POA: Diagnosis not present

## 2022-02-20 DIAGNOSIS — Z79899 Other long term (current) drug therapy: Secondary | ICD-10-CM | POA: Diagnosis not present

## 2022-02-21 ENCOUNTER — Ambulatory Visit: Payer: Medicare Other | Admitting: Cardiology

## 2022-02-21 DIAGNOSIS — Z992 Dependence on renal dialysis: Secondary | ICD-10-CM | POA: Diagnosis not present

## 2022-02-21 DIAGNOSIS — E8779 Other fluid overload: Secondary | ICD-10-CM | POA: Diagnosis not present

## 2022-02-21 DIAGNOSIS — N186 End stage renal disease: Secondary | ICD-10-CM | POA: Diagnosis not present

## 2022-02-21 NOTE — Progress Notes (Unsigned)
{Choose 1 Note Type (Video or Telephone):[phone]}    Date:  02/21/2022   ID:  [name], DOB 08-Aug-1945, MRN [id number] The patient was identified using 2 identifiers.  {Patient Location:[phone]::"Home"} {Provider Location:[phone]::"Home Office"}   PCP:  [name], MD   [facility] Cardiologist:  Carlyle Dolly, MD { Click to update primary MD,subspecialty MD or APP then REFRESH:1}    Evaluation Performed:  {Choose Visit Type:[phone]::"Follow-Up Visit"}  Chief Complaint:  ***  History of Present Illness:    Margaret Huerta is a 77 y.o. female with ***  seen today for follow up of the following medical problems.      1. History of chest pain Patient had left heart cath in 2003 (a J.  Ganji) and in 2016.  For abnormal stress test.  This both showed normal coronary arteries   07/2019 mild ischemia, low risk - no recent symptoms - previously has responved to SL NG, has done well since starting isordil.        - some chest pains at times. Often after HD, improves with SL NG.    2. HTN Compliant with meds - has had chronic issues with orthostatic hypotension, appear somewhat worsened since starting dialysis - low bp's at times, some orthostatic symptoms.    - last visit we lowered hydralazine to '75mg'$  tid   3. Hyperlipidemia - labs followed by pcp - she is on crestor 08/2021 TC 127 TG 121 HDL 53 LDL 53           4. ESRD - Dr [name] follows - left sided fistula developed steal syndrome. - doing home peritoneal HD. Low bp's at times to 80s to 90s with standing       4. DM2   5. OSA  - not comfortable with cpap, not using.    6. Orthostatic syncope -  chronic orthostatic dizzienss at times       SH: husband diagnosed colon cancer, due for surgery this month June 2023 Past Medical History:  Diagnosis Date   Anemia    Anxiety    Cervical cancer ([place])    Chronic bronchitis ([place])    "get it q yr"   Chronic lower back  pain    Depression    Febrile seizure ([place])    "as a child"   Fibromyalgia    GERD (gastroesophageal reflux disease)    Gout    Hemodialysis patient ([place])    History of blood transfusion    "S/P tonsillectomy"   History of hiatal hernia    Hx of cardiovascular stress test 05/14/2014   false positive Myoview   Hyperlipidemia    Hypertension    Migraine    "monthly" (05/26/2014)   Neuropathy    Osteoporosis    Parathyroid disease ([place])    "my PHT levels run high; I can't take calcium"   Pneumonia "several times"   Renal cancer ([place])    "right"-s/p nephrectomy   Renal insufficiency    "left kidney works at 40-60%" (05/26/2014)   Sleep apnea    "wore mask for 2 months; could not take it" (05/26/2014)   Type II diabetes mellitus ([place])    Typhus fever    "as a child"   Past Surgical History:  Procedure Laterality Date   ABDOMINAL HERNIA REPAIR  02/2013   "in [place]"   [place]   "partial"   APPENDECTOMY     AV FISTULA PLACEMENT Left 06/13/2021   Procedure: LEFT  ARM ARTERIOVENOUS FISTULA CREATION;  Surgeon: Rosetta Posner, MD;  Location: [facility];  Service: Vascular;  Laterality: Left;   BACK SURGERY     BILATERAL SALPINGOOPHORECTOMY Bilateral ~ [place]  2003, April 2016   normal coronaries   CATARACT EXTRACTION W/ INTRAOCULAR LENS  IMPLANT, BILATERAL  2015   COLONOSCOPY N/A 11/25/2014   Procedure: COLONOSCOPY;  Surgeon: Rogene Houston, MD;  Location: [facility];  Service: Endoscopy;  Laterality: N/A;   COLONOSCOPY WITH PROPOFOL N/A 12/12/2021   Procedure: COLONOSCOPY WITH PROPOFOL;  Surgeon: Harvel Quale, MD;  Location: [facility];  Service: Gastroenterology;  Laterality: N/A;  130 ASA 3 patient has dialysis Mon Wed & Fri, per Darius Bump pt knows new arrival time   [place] OF UTERUS     ESOPHAGOGASTRODUODENOSCOPY N/A 11/25/2014    Procedure: ESOPHAGOGASTRODUODENOSCOPY (EGD);  Surgeon: Rogene Houston, MD;  [facility]: [facility];  Service: Endoscopy;  Laterality: N/A;  9:30 - moved to 11:25 - [name] to notify pt   ESOPHAGOGASTRODUODENOSCOPY (EGD) [place]     GASTRIC BYPASS  2012   HERNIA REPAIR     2015   [place]   INSERTION OF DIALYSIS CATHETER Right 09/19/2021   Procedure: INSERTION OF TUNNELED DIALYSIS CATHETER;  Surgeon: Rosetta Posner, MD;  Location: [facility];  Service: Vascular;  Laterality: Right;   KNEE ARTHROSCOPY Left 2000's   LAPAROSCOPIC CHOLECYSTECTOMY  1990's   LEFT HEART CATHETERIZATION WITH CORONARY ANGIOGRAM N/A 05/27/2014   Procedure: LEFT HEART CATHETERIZATION WITH CORONARY ANGIOGRAM;  Surgeon: Lorretta Harp, MD;  [facility] C[facility] LAB;  Service: Cardiovascular;  Laterality: N/A;   LIGATION OF ARTERIOVENOUS  FISTULA Left 09/19/2021   Procedure: LIGATION OF ARTERIOVENOUS  FISTULA;  Surgeon: Rosetta Posner, MD;  Location: [facility];  Service: Vascular;  Laterality: Left;   [place] SURGERY  2001   "herniated discs"   [place]   "drilled into gum and put tooth implants uppers"   NEPHRECTOMY Right 2002   "cancer"   POLYPECTOMY  12/12/2021   Procedure: POLYPECTOMY;  Surgeon: Harvel Quale, MD;  Location: [facility];  Service: Gastroenterology;;   [facility] [place] [facility] ENDOSCOPY       No outpatient medications have been marked as taking for the 02/21/22 encounter (Appointment) with Arnoldo Lenis, MD.     Allergies:   Demerol [meperidine], Lyrica [pregabalin], Vicodin [hydrocodone-acetaminophen], Ace inhibitors, Aleve [naproxen], Codeine, Contrast media [iodinated contrast media], Fentanyl, Gluten meal, Lactose intolerance (gi), Morphine, Neurontin [gabapentin], Norvasc [amlodipine], Nsaids, Other, and Dilaudid [hydromorphone hcl]   Social History    Tobacco Use   Smoking status: Never    Passive exposure: Never   Smokeless tobacco: Never  Vaping Use   Vaping Use: Never used  Substance Use Topics   Alcohol use: No    Alcohol/week: 0.0 standard drinks of alcohol   Drug use: No     Family Hx: The patient's family history includes Alcohol abuse in her maternal uncle; Allergies in an other family member; Clotting disorder in her mother; Diabetes in her mother; Emphysema in her father; Heart disease in her father; Kidney disease in her mother; Lung cancer in her maternal uncle.  ROS:  Please see the history of present illness.    *** All other systems reviewed and are negative.   Prior CV studies:   The following studies were reviewed today:  Nuclear stress test 08/05/2019 There was no ST segment deviation noted during stress. Findings consistent with mild apical ischemia. This is a low risk study. The left ventricular ejection fraction is hyperdynamic (>65%).   Echo 08/05/19 . Left ventricular ejection fraction, by estimation, is 60 to 65%. The  left ventricle has normal function. The left ventricle has no regional  wall motion abnormalities. There is mild left ventricular hypertrophy.  Left ventricular diastolic parameters  are consistent with Grade I diastolic dysfunction (impaired relaxation).   2. Right ventricular systolic function is normal. The right ventricular  size is normal. There is mildly elevated pulmonary artery systolic  pressure.   3. Left atrial size was severely dilated.   4. Right atrial size was mildly dilated.   5. The mitral valve is normal in structure. Mild mitral valve  regurgitation. No evidence of mitral stenosis.   6. The aortic valve is tricuspid. Aortic valve regurgitation is not  visualized. No aortic stenosis is present.   7. The inferior vena cava is normal in size with greater than 50%  respiratory variability, suggesting right atrial pressure of 3 mmHg.   8. Left to right atrial  shunting noted by color Doppler, probable PFO.      11/2019 renal artery [place] Summary:  Largest Aortic Diameter: 2.2 cm     Renal:     Right: Right kidney removed 2002.  Left:  Normal size of left kidney. Abnormal left Resisitve Index.         Normal cortical thickness of the left kidney. No evidence of         left renal artery stenosis. LRV flow present.  Mesenteric:  Normal Celiac artery and Superior Mesenteric artery findings.   Labs/Other Tests and Data Reviewed:    EKG:  {EKG/Telemetry Strips Reviewed:[phone]}  Recent Labs: 11/06/2021: ALT 13 01/12/2022: BUN 16; Creatinine, Ser 2.91; Hemoglobin 12.3; Platelets 82; Potassium 3.7; Sodium 136   Recent Lipid Panel No results found for: "CHOL", "TRIG", "HDL", "CHOLHDL", "LDLCALC", "LDLDIRECT"  Wt Readings from Last 3 Encounters:  01/31/22 158 lb 12.8 oz (72 kg)  01/12/22 154 lb (69.9 kg)  12/12/21 151 lb 14.4 oz (68.9 kg)     Risk Assessment/Calculations:   {Does this patient have ATRIAL FIBRILLATION?:[phone]}      Objective:    Vital Signs:  There were no vitals taken for this visit.   {[facility] Virtual Exam (Optional):[phone]::"VITAL SIGNS:  reviewed"}  ASSESSMENT & PLAN:    1. Chest pain - long history of symptoms with overall benign ischemic testing, most recently 07/2019 -has improved in general on nitrates. Some recent symptoms particularly during HD, may try increasing isordil in near future pending pattern of her ongoing orthostatic symptoms   2. HTN -at goal. Chronic issues with orthostatic hypotension, seems more promiineny since starting HD - lower hydralazine to '75mg'$  tid and monitor orthostatilc symptoms, likely will need to tolerate high bp's in genelar   3. Hyperlipidemia - at goal, continue current medes   4. OSA - refer to pulmonary      {Are you ordering a CV Procedure (e.g. stress test, cath, DCCV, TEE, etc)?   Press F2        :[id number]}     Time:   Today, I have spent ***  minutes with the patient with  telehealth technology discussing the above problems.     Medication Adjustments/Labs and Tests Ordered: Current medicines are reviewed at length with the patient today.  Concerns regarding medicines are outlined above.   Tests Ordered: No orders of the defined types were placed in this encounter.   Medication Changes: No orders of the defined types were placed in this encounter.   Follow Up:  {F/U Format:[phone]} {follow up:15908}  Signed, Carlyle Dolly, MD  02/21/2022 7:57 AM    [place]

## 2022-02-22 DIAGNOSIS — N186 End stage renal disease: Secondary | ICD-10-CM | POA: Diagnosis not present

## 2022-02-22 DIAGNOSIS — Z992 Dependence on renal dialysis: Secondary | ICD-10-CM | POA: Diagnosis not present

## 2022-02-22 DIAGNOSIS — E8779 Other fluid overload: Secondary | ICD-10-CM | POA: Diagnosis not present

## 2022-02-23 DIAGNOSIS — E8779 Other fluid overload: Secondary | ICD-10-CM | POA: Diagnosis not present

## 2022-02-23 DIAGNOSIS — N186 End stage renal disease: Secondary | ICD-10-CM | POA: Diagnosis not present

## 2022-02-23 DIAGNOSIS — Z992 Dependence on renal dialysis: Secondary | ICD-10-CM | POA: Diagnosis not present

## 2022-02-26 DIAGNOSIS — E8779 Other fluid overload: Secondary | ICD-10-CM | POA: Diagnosis not present

## 2022-02-26 DIAGNOSIS — Z992 Dependence on renal dialysis: Secondary | ICD-10-CM | POA: Diagnosis not present

## 2022-02-26 DIAGNOSIS — N186 End stage renal disease: Secondary | ICD-10-CM | POA: Diagnosis not present

## 2022-02-27 DIAGNOSIS — I10 Essential (primary) hypertension: Secondary | ICD-10-CM | POA: Diagnosis not present

## 2022-02-27 DIAGNOSIS — R7309 Other abnormal glucose: Secondary | ICD-10-CM | POA: Diagnosis not present

## 2022-02-27 DIAGNOSIS — M25512 Pain in left shoulder: Secondary | ICD-10-CM | POA: Diagnosis not present

## 2022-02-27 DIAGNOSIS — E8779 Other fluid overload: Secondary | ICD-10-CM | POA: Diagnosis not present

## 2022-02-27 DIAGNOSIS — L659 Nonscarring hair loss, unspecified: Secondary | ICD-10-CM | POA: Diagnosis not present

## 2022-02-27 DIAGNOSIS — E1122 Type 2 diabetes mellitus with diabetic chronic kidney disease: Secondary | ICD-10-CM | POA: Diagnosis not present

## 2022-02-27 DIAGNOSIS — N186 End stage renal disease: Secondary | ICD-10-CM | POA: Diagnosis not present

## 2022-02-27 DIAGNOSIS — Z992 Dependence on renal dialysis: Secondary | ICD-10-CM | POA: Diagnosis not present

## 2022-03-01 DIAGNOSIS — N186 End stage renal disease: Secondary | ICD-10-CM | POA: Diagnosis not present

## 2022-03-01 DIAGNOSIS — E8779 Other fluid overload: Secondary | ICD-10-CM | POA: Diagnosis not present

## 2022-03-01 DIAGNOSIS — Z992 Dependence on renal dialysis: Secondary | ICD-10-CM | POA: Diagnosis not present

## 2022-03-02 DIAGNOSIS — E8779 Other fluid overload: Secondary | ICD-10-CM | POA: Diagnosis not present

## 2022-03-02 DIAGNOSIS — N186 End stage renal disease: Secondary | ICD-10-CM | POA: Diagnosis not present

## 2022-03-02 DIAGNOSIS — Z992 Dependence on renal dialysis: Secondary | ICD-10-CM | POA: Diagnosis not present

## 2022-03-03 DIAGNOSIS — E8779 Other fluid overload: Secondary | ICD-10-CM | POA: Diagnosis not present

## 2022-03-03 DIAGNOSIS — N186 End stage renal disease: Secondary | ICD-10-CM | POA: Diagnosis not present

## 2022-03-03 DIAGNOSIS — Z992 Dependence on renal dialysis: Secondary | ICD-10-CM | POA: Diagnosis not present

## 2022-03-05 DIAGNOSIS — Z992 Dependence on renal dialysis: Secondary | ICD-10-CM | POA: Diagnosis not present

## 2022-03-05 DIAGNOSIS — N186 End stage renal disease: Secondary | ICD-10-CM | POA: Diagnosis not present

## 2022-03-05 DIAGNOSIS — E8779 Other fluid overload: Secondary | ICD-10-CM | POA: Diagnosis not present

## 2022-03-06 DIAGNOSIS — E8779 Other fluid overload: Secondary | ICD-10-CM | POA: Diagnosis not present

## 2022-03-06 DIAGNOSIS — Z992 Dependence on renal dialysis: Secondary | ICD-10-CM | POA: Diagnosis not present

## 2022-03-06 DIAGNOSIS — N186 End stage renal disease: Secondary | ICD-10-CM | POA: Diagnosis not present

## 2022-03-07 DIAGNOSIS — Z992 Dependence on renal dialysis: Secondary | ICD-10-CM | POA: Diagnosis not present

## 2022-03-07 DIAGNOSIS — N186 End stage renal disease: Secondary | ICD-10-CM | POA: Diagnosis not present

## 2022-03-07 DIAGNOSIS — E8779 Other fluid overload: Secondary | ICD-10-CM | POA: Diagnosis not present

## 2022-03-08 DIAGNOSIS — E8779 Other fluid overload: Secondary | ICD-10-CM | POA: Diagnosis not present

## 2022-03-08 DIAGNOSIS — Z992 Dependence on renal dialysis: Secondary | ICD-10-CM | POA: Diagnosis not present

## 2022-03-08 DIAGNOSIS — N186 End stage renal disease: Secondary | ICD-10-CM | POA: Diagnosis not present

## 2022-03-09 DIAGNOSIS — Z992 Dependence on renal dialysis: Secondary | ICD-10-CM | POA: Diagnosis not present

## 2022-03-09 DIAGNOSIS — N186 End stage renal disease: Secondary | ICD-10-CM | POA: Diagnosis not present

## 2022-03-09 DIAGNOSIS — E8779 Other fluid overload: Secondary | ICD-10-CM | POA: Diagnosis not present

## 2022-03-12 DIAGNOSIS — Z992 Dependence on renal dialysis: Secondary | ICD-10-CM | POA: Diagnosis not present

## 2022-03-12 DIAGNOSIS — N186 End stage renal disease: Secondary | ICD-10-CM | POA: Diagnosis not present

## 2022-03-12 DIAGNOSIS — E8779 Other fluid overload: Secondary | ICD-10-CM | POA: Diagnosis not present

## 2022-03-13 DIAGNOSIS — N186 End stage renal disease: Secondary | ICD-10-CM | POA: Diagnosis not present

## 2022-03-13 DIAGNOSIS — E8779 Other fluid overload: Secondary | ICD-10-CM | POA: Diagnosis not present

## 2022-03-13 DIAGNOSIS — Z992 Dependence on renal dialysis: Secondary | ICD-10-CM | POA: Diagnosis not present

## 2022-03-14 DIAGNOSIS — E8779 Other fluid overload: Secondary | ICD-10-CM | POA: Diagnosis not present

## 2022-03-14 DIAGNOSIS — Z992 Dependence on renal dialysis: Secondary | ICD-10-CM | POA: Diagnosis not present

## 2022-03-14 DIAGNOSIS — N186 End stage renal disease: Secondary | ICD-10-CM | POA: Diagnosis not present

## 2022-03-15 DIAGNOSIS — N186 End stage renal disease: Secondary | ICD-10-CM | POA: Diagnosis not present

## 2022-03-15 DIAGNOSIS — Z992 Dependence on renal dialysis: Secondary | ICD-10-CM | POA: Diagnosis not present

## 2022-03-15 DIAGNOSIS — E8779 Other fluid overload: Secondary | ICD-10-CM | POA: Diagnosis not present

## 2022-03-16 DIAGNOSIS — E8779 Other fluid overload: Secondary | ICD-10-CM | POA: Diagnosis not present

## 2022-03-16 DIAGNOSIS — N186 End stage renal disease: Secondary | ICD-10-CM | POA: Diagnosis not present

## 2022-03-16 DIAGNOSIS — Z992 Dependence on renal dialysis: Secondary | ICD-10-CM | POA: Diagnosis not present

## 2022-03-19 DIAGNOSIS — E8779 Other fluid overload: Secondary | ICD-10-CM | POA: Diagnosis not present

## 2022-03-19 DIAGNOSIS — N186 End stage renal disease: Secondary | ICD-10-CM | POA: Diagnosis not present

## 2022-03-19 DIAGNOSIS — Z992 Dependence on renal dialysis: Secondary | ICD-10-CM | POA: Diagnosis not present

## 2022-03-20 DIAGNOSIS — E8779 Other fluid overload: Secondary | ICD-10-CM | POA: Diagnosis not present

## 2022-03-20 DIAGNOSIS — Z992 Dependence on renal dialysis: Secondary | ICD-10-CM | POA: Diagnosis not present

## 2022-03-20 DIAGNOSIS — N186 End stage renal disease: Secondary | ICD-10-CM | POA: Diagnosis not present

## 2022-03-21 ENCOUNTER — Encounter (INDEPENDENT_AMBULATORY_CARE_PROVIDER_SITE_OTHER): Payer: Self-pay | Admitting: Gastroenterology

## 2022-03-21 DIAGNOSIS — N186 End stage renal disease: Secondary | ICD-10-CM | POA: Diagnosis not present

## 2022-03-21 DIAGNOSIS — Z992 Dependence on renal dialysis: Secondary | ICD-10-CM | POA: Diagnosis not present

## 2022-03-21 DIAGNOSIS — E8779 Other fluid overload: Secondary | ICD-10-CM | POA: Diagnosis not present

## 2022-03-22 ENCOUNTER — Encounter: Payer: Self-pay | Admitting: Pulmonary Disease

## 2022-03-22 ENCOUNTER — Ambulatory Visit (INDEPENDENT_AMBULATORY_CARE_PROVIDER_SITE_OTHER): Payer: Medicare Other | Admitting: Pulmonary Disease

## 2022-03-22 VITALS — BP 98/64 | HR 63 | Ht 62.0 in | Wt 163.2 lb

## 2022-03-22 DIAGNOSIS — N186 End stage renal disease: Secondary | ICD-10-CM | POA: Diagnosis not present

## 2022-03-22 DIAGNOSIS — R0683 Snoring: Secondary | ICD-10-CM

## 2022-03-22 DIAGNOSIS — G4733 Obstructive sleep apnea (adult) (pediatric): Secondary | ICD-10-CM

## 2022-03-22 DIAGNOSIS — Z992 Dependence on renal dialysis: Secondary | ICD-10-CM | POA: Diagnosis not present

## 2022-03-22 DIAGNOSIS — E8779 Other fluid overload: Secondary | ICD-10-CM | POA: Diagnosis not present

## 2022-03-22 NOTE — Progress Notes (Signed)
Subjective:    Patient ID: Margaret Huerta, female    DOB: 10/14/1945, 77 y.o.   MRN: [id number]  HPI  77 year old dialysis patient presents for evaluation of OSA, referred by cardiology  PMH -hypertension ESRD on home hemodialysis, status post right nephrectomy 2001, right permacath, failed LUE graft Diabetes type 2, diet controlled Migraine headaches triggered by food and restless nights  She was diagnosed with moderate to severe OSA in 2011 but could not tolerate CPAP therapy due to high pressure required.  She has since gone on dialysis and last considerable weight to her current weight of 163 pounds.  She sleeps in the bed that inclines about 45 degrees due to her headaches. Epworth sleepiness score is 3 and she denies significant somnolence in the daytime. Bedtime is between 10 PM and midnight, sleep latency can vary up to an hour, she sleeps daughter back with 2 pillows, reports very few nocturnal awakenings and is out of bed latest by 9 AM feeling tired with dryness of mouth and occasional headaches.  There is no history suggestive of cataplexy, sleep paralysis or parasomnias   Significant tests/ events reviewed  11/2009 split study >> AHI 29/hour, corrected by CPAP 17 cm with a fullface mask, weight 261 pounds  Past Medical History:  Diagnosis Date   Anemia    Anxiety    Cervical cancer ([place])    Chronic bronchitis ([place])    "get it q yr"   Chronic lower back pain    Depression    Febrile seizure ([place])    "as a child"   Fibromyalgia    GERD (gastroesophageal reflux disease)    Gout    Hemodialysis patient ([place])    History of blood transfusion    "S/P tonsillectomy"   History of hiatal hernia    Hx of cardiovascular stress test 05/14/2014   false positive Myoview   Hyperlipidemia    Hypertension    Migraine    "monthly" (05/26/2014)   Neuropathy    Osteoporosis    Parathyroid disease ([place])    "my PHT levels run high; I can't take calcium"   Pneumonia "several  times"   Renal cancer ([place])    "right"-s/p nephrectomy   Renal insufficiency    "left kidney works at 40-60%" (05/26/2014)   Sleep apnea    "wore mask for 2 months; could not take it" (05/26/2014)   Type II diabetes mellitus ([place])    Typhus fever    "as a child"    Past Surgical History:  Procedure Laterality Date   ABDOMINAL HERNIA REPAIR  02/2013   "in [place]"   [place]   "partial"   APPENDECTOMY     AV FISTULA PLACEMENT Left 06/13/2021   Procedure: LEFT ARM ARTERIOVENOUS FISTULA CREATION;  Surgeon: Rosetta Posner, MD;  Location: [facility];  Service: Vascular;  Laterality: Left;   BACK SURGERY     BILATERAL SALPINGOOPHORECTOMY Bilateral ~ [place]  2003, April 2016   normal coronaries   CATARACT EXTRACTION W/ INTRAOCULAR LENS  IMPLANT, BILATERAL  2015   COLONOSCOPY N/A 11/25/2014   Procedure: COLONOSCOPY;  Surgeon: Rogene Houston, MD;  Location: [facility];  Service: Endoscopy;  Laterality: N/A;   COLONOSCOPY WITH PROPOFOL N/A 12/12/2021   Procedure: COLONOSCOPY WITH PROPOFOL;  Surgeon: Harvel Quale, MD;  Location: [facility];  Service: Gastroenterology;  Laterality: N/A;  130 ASA 3 patient has dialysis Mon Wed & Fri, per Marliss Czar  [name] pt knows new arrival time   [place] OF UTERUS     ESOPHAGOGASTRODUODENOSCOPY N/A 11/25/2014   Procedure: ESOPHAGOGASTRODUODENOSCOPY (EGD);  Surgeon: Rogene Houston, MD;  [facility]: [facility];  Service: Endoscopy;  Laterality: N/A;  9:30 - moved to 11:25 - [name] to notify pt   ESOPHAGOGASTRODUODENOSCOPY (EGD) [place]     GASTRIC BYPASS  2012   HERNIA REPAIR     2015   [place]   INSERTION OF DIALYSIS CATHETER Right 09/19/2021   Procedure: INSERTION OF TUNNELED DIALYSIS CATHETER;  Surgeon: Rosetta Posner, MD;  Location: [facility];  Service: Vascular;  Laterality: Right;   KNEE ARTHROSCOPY Left 2000's   LAPAROSCOPIC CHOLECYSTECTOMY  1990's   LEFT HEART CATHETERIZATION WITH CORONARY ANGIOGRAM N/A 05/27/2014   Procedure: LEFT HEART CATHETERIZATION WITH CORONARY ANGIOGRAM;  Surgeon: Lorretta Harp, MD;  Location: [facility] CATH LAB;  Service: Cardiovascular;  Laterality: N/A;   LIGATION OF ARTERIOVENOUS  FISTULA Left 09/19/2021   Procedure: LIGATION OF ARTERIOVENOUS  FISTULA;  Surgeon: Rosetta Posner, MD;  Location: [facility];  Service: Vascular;  Laterality: Left;   [place] SURGERY  2001   "herniated discs"   [place]   "drilled into gum and put tooth implants uppers"   NEPHRECTOMY Right 2002   "cancer"   POLYPECTOMY  12/12/2021   Procedure: POLYPECTOMY;  Surgeon: Harvel Quale, MD;  [facility];  Service: Gastroenterology;;   TMJ ARTHROPLASTY  1988   TONSILLECTOMY  1976   UPPER GI ENDOSCOPY      Allergies  Allergen Reactions   Demerol [Meperidine] Other (See Comments)    Stop breathing   Lyrica [Pregabalin] Swelling    Stomach lesion   Vicodin [Hydrocodone-Acetaminophen] Other (See Comments)    Stop breathing   Ace Inhibitors Cough   Aleve [Naproxen] Other (See Comments)    stomach lesion   Codeine Other (See Comments)    hallucination, confused   Contrast Media [Iodinated Contrast Media] Nausea Only and Other (See Comments)    MRI dye. Shaky, nausea   Fentanyl Itching   Gluten Meal Diarrhea    GI-upset   Lactose Intolerance (Gi) Other (See Comments)    Gi upset   Morphine Nausea And Vomiting   Neurontin [Gabapentin] Nausea Only, Swelling and Other (See Comments)    Confusion. Felt like in a dream. foggy   Norvasc [Amlodipine] Swelling   Nsaids Other (See Comments)    Severe chronic kidney disease   Other Other (See Comments)    Anesthesia--prolonged sleep inertia (grogginess/disorientation/cognitive impairment)   Dilaudid [Hydromorphone Hcl] Itching, Nausea And  Vomiting and Rash    Social History   Socioeconomic History   Marital status: Married    Spouse name: Karington Zarazua   Number of children: 1   Years of education: 12   Highest education level: 12th grade  Occupational History   Occupation: Housewife  Tobacco Use   Smoking status: Never    Passive exposure: Never   Smokeless tobacco: Never  Vaping Use   Vaping Use: Never used  Substance and Sexual Activity   Alcohol use: No    Alcohol/week: 0.0 standard drinks of alcohol   Drug use: No   Sexual activity: Yes    Partners: Male    Birth control/protection:  Surgical  Other Topics Concern   Not on file  Social History Narrative   Not on file   Social Determinants of Health   Financial Resource Strain: Low Risk  (09/18/2021)   Overall Financial Resource Strain (CARDIA)    Difficulty of Paying Living Expenses: Not hard at all  Food Insecurity: No Food Insecurity (11/07/2021)   Hunger Vital Sign    Worried About Running Out of Food in the Last Year: Never true    Ran Out of Food in the Last Year: Never true  Transportation Needs: No Transportation Needs (11/07/2021)   PRAPARE - Hydrologist (Medical): No    Lack of Transportation (Non-Medical): No  Physical Activity: Inactive (09/18/2021)   Exercise Vital Sign    Days of Exercise per Week: 0 days    Minutes of Exercise per Session: 0 min  Stress: No Stress Concern Present (09/18/2021)   [place]    Feeling of Stress : Only a little  Social Connections: Socially Integrated (09/18/2021)   Social Connection and Isolation Panel [NHANES]    Frequency of Communication with Friends and Family: More than three times a week    Frequency of Social Gatherings with Friends and Family: More than three times a week    Attends Religious Services: More than 4 times per year    Active Member of [employer] or Organizations: Yes    Attends Archivist  Meetings: 1 to 4 times per year    Marital Status: Married  Human resources officer Violence: Not At Risk (11/07/2021)   Humiliation, Afraid, Rape, and Kick questionnaire    Fear of Current or Ex-Partner: No    Emotionally Abused: No    Physically Abused: No    Sexually Abused: No    Family History  Problem Relation Age of Onset   Emphysema Father    Heart disease Father    Clotting disorder Mother    Diabetes Mother    Kidney disease Mother    Allergies Other        whole family per pt   Lung cancer Maternal Uncle    Alcohol abuse Maternal Uncle      Review of Systems Shortness of breath with activity Chest pain Irregular heartbeat Indigestion loss of appetite Weight loss Difficulty swallowing Sore throat Tooth problems Headaches and nasal congestion Itching Earache and anxiety Feet swelling     Objective:   Physical Exam  Gen. Pleasant, well-nourished, in no distress, normal affect ENT - no pallor,icterus, no post nasal drip Neck: No JVD, no thyromegaly, no carotid bruits, right chest permacath Lungs: no use of accessory muscles, no dullness to percussion, clear without rales or rhonchi  Cardiovascular: Rhythm regular, heart sounds  normal, no murmurs or gallops, no peripheral edema Abdomen: soft and non-tender, no hepatosplenomegaly, BS normal. Musculoskeletal: No deformities, no cyanosis or clubbing Neuro:  alert, non focal        Assessment & Plan:   OSA -she has moderate to severe OSA diagnosed in 2011.  She is lost 100 pounds since then and it is very likely that OSA is resolved.  She does not appear to have any symptoms of OSA.  She is also sleeping with head of bed elevation and this may also be helping.  Will reassess with a home sleep test for residual OSA.  If she does have significant OSA, she would be willing to retrial CPAP my feeling is that  if so she would require lesser pressure which she may tolerate  The pathophysiology of obstructive sleep apnea  , it's cardiovascular consequences & modes of treatment including CPAP were discused with the patient in detail & they evidenced understanding.

## 2022-03-22 NOTE — Patient Instructions (Addendum)
  X Home sleep test Sleep in your usual manner

## 2022-03-23 DIAGNOSIS — E8779 Other fluid overload: Secondary | ICD-10-CM | POA: Diagnosis not present

## 2022-03-23 DIAGNOSIS — Z992 Dependence on renal dialysis: Secondary | ICD-10-CM | POA: Diagnosis not present

## 2022-03-23 DIAGNOSIS — N186 End stage renal disease: Secondary | ICD-10-CM | POA: Diagnosis not present

## 2022-03-26 DIAGNOSIS — E8779 Other fluid overload: Secondary | ICD-10-CM | POA: Diagnosis not present

## 2022-03-26 DIAGNOSIS — Z992 Dependence on renal dialysis: Secondary | ICD-10-CM | POA: Diagnosis not present

## 2022-03-26 DIAGNOSIS — N186 End stage renal disease: Secondary | ICD-10-CM | POA: Diagnosis not present

## 2022-03-27 DIAGNOSIS — Z992 Dependence on renal dialysis: Secondary | ICD-10-CM | POA: Diagnosis not present

## 2022-03-27 DIAGNOSIS — E8779 Other fluid overload: Secondary | ICD-10-CM | POA: Diagnosis not present

## 2022-03-27 DIAGNOSIS — N186 End stage renal disease: Secondary | ICD-10-CM | POA: Diagnosis not present

## 2022-03-28 DIAGNOSIS — Z992 Dependence on renal dialysis: Secondary | ICD-10-CM | POA: Diagnosis not present

## 2022-03-28 DIAGNOSIS — N186 End stage renal disease: Secondary | ICD-10-CM | POA: Diagnosis not present

## 2022-03-28 DIAGNOSIS — E8779 Other fluid overload: Secondary | ICD-10-CM | POA: Diagnosis not present

## 2022-03-29 DIAGNOSIS — Z992 Dependence on renal dialysis: Secondary | ICD-10-CM | POA: Diagnosis not present

## 2022-03-29 DIAGNOSIS — E8779 Other fluid overload: Secondary | ICD-10-CM | POA: Diagnosis not present

## 2022-03-29 DIAGNOSIS — N186 End stage renal disease: Secondary | ICD-10-CM | POA: Diagnosis not present

## 2022-03-30 DIAGNOSIS — N186 End stage renal disease: Secondary | ICD-10-CM | POA: Diagnosis not present

## 2022-03-30 DIAGNOSIS — E8779 Other fluid overload: Secondary | ICD-10-CM | POA: Diagnosis not present

## 2022-03-30 DIAGNOSIS — Z992 Dependence on renal dialysis: Secondary | ICD-10-CM | POA: Diagnosis not present

## 2022-04-02 DIAGNOSIS — Z992 Dependence on renal dialysis: Secondary | ICD-10-CM | POA: Diagnosis not present

## 2022-04-02 DIAGNOSIS — E8779 Other fluid overload: Secondary | ICD-10-CM | POA: Diagnosis not present

## 2022-04-02 DIAGNOSIS — N186 End stage renal disease: Secondary | ICD-10-CM | POA: Diagnosis not present

## 2022-04-03 DIAGNOSIS — Z992 Dependence on renal dialysis: Secondary | ICD-10-CM | POA: Diagnosis not present

## 2022-04-03 DIAGNOSIS — E8779 Other fluid overload: Secondary | ICD-10-CM | POA: Diagnosis not present

## 2022-04-03 DIAGNOSIS — N186 End stage renal disease: Secondary | ICD-10-CM | POA: Diagnosis not present

## 2022-04-04 DIAGNOSIS — E8779 Other fluid overload: Secondary | ICD-10-CM | POA: Diagnosis not present

## 2022-04-04 DIAGNOSIS — N186 End stage renal disease: Secondary | ICD-10-CM | POA: Diagnosis not present

## 2022-04-04 DIAGNOSIS — Z992 Dependence on renal dialysis: Secondary | ICD-10-CM | POA: Diagnosis not present

## 2022-04-05 DIAGNOSIS — Z992 Dependence on renal dialysis: Secondary | ICD-10-CM | POA: Diagnosis not present

## 2022-04-05 DIAGNOSIS — N186 End stage renal disease: Secondary | ICD-10-CM | POA: Diagnosis not present

## 2022-04-05 DIAGNOSIS — E8779 Other fluid overload: Secondary | ICD-10-CM | POA: Diagnosis not present

## 2022-04-06 DIAGNOSIS — N186 End stage renal disease: Secondary | ICD-10-CM | POA: Diagnosis not present

## 2022-04-06 DIAGNOSIS — Z992 Dependence on renal dialysis: Secondary | ICD-10-CM | POA: Diagnosis not present

## 2022-04-06 DIAGNOSIS — E8779 Other fluid overload: Secondary | ICD-10-CM | POA: Diagnosis not present

## 2022-04-09 DIAGNOSIS — E8779 Other fluid overload: Secondary | ICD-10-CM | POA: Diagnosis not present

## 2022-04-09 DIAGNOSIS — Z992 Dependence on renal dialysis: Secondary | ICD-10-CM | POA: Diagnosis not present

## 2022-04-09 DIAGNOSIS — N186 End stage renal disease: Secondary | ICD-10-CM | POA: Diagnosis not present

## 2022-04-10 DIAGNOSIS — E8779 Other fluid overload: Secondary | ICD-10-CM | POA: Diagnosis not present

## 2022-04-10 DIAGNOSIS — Z992 Dependence on renal dialysis: Secondary | ICD-10-CM | POA: Diagnosis not present

## 2022-04-10 DIAGNOSIS — N186 End stage renal disease: Secondary | ICD-10-CM | POA: Diagnosis not present

## 2022-04-12 DIAGNOSIS — E8779 Other fluid overload: Secondary | ICD-10-CM | POA: Diagnosis not present

## 2022-04-12 DIAGNOSIS — Z992 Dependence on renal dialysis: Secondary | ICD-10-CM | POA: Diagnosis not present

## 2022-04-12 DIAGNOSIS — N186 End stage renal disease: Secondary | ICD-10-CM | POA: Diagnosis not present

## 2022-04-13 DIAGNOSIS — E8779 Other fluid overload: Secondary | ICD-10-CM | POA: Diagnosis not present

## 2022-04-13 DIAGNOSIS — N186 End stage renal disease: Secondary | ICD-10-CM | POA: Diagnosis not present

## 2022-04-13 DIAGNOSIS — Z992 Dependence on renal dialysis: Secondary | ICD-10-CM | POA: Diagnosis not present

## 2022-04-16 DIAGNOSIS — E8779 Other fluid overload: Secondary | ICD-10-CM | POA: Diagnosis not present

## 2022-04-16 DIAGNOSIS — N186 End stage renal disease: Secondary | ICD-10-CM | POA: Diagnosis not present

## 2022-04-16 DIAGNOSIS — Z992 Dependence on renal dialysis: Secondary | ICD-10-CM | POA: Diagnosis not present

## 2022-04-17 DIAGNOSIS — E8779 Other fluid overload: Secondary | ICD-10-CM | POA: Diagnosis not present

## 2022-04-17 DIAGNOSIS — Z992 Dependence on renal dialysis: Secondary | ICD-10-CM | POA: Diagnosis not present

## 2022-04-17 DIAGNOSIS — N186 End stage renal disease: Secondary | ICD-10-CM | POA: Diagnosis not present

## 2022-04-19 DIAGNOSIS — E8779 Other fluid overload: Secondary | ICD-10-CM | POA: Diagnosis not present

## 2022-04-19 DIAGNOSIS — N186 End stage renal disease: Secondary | ICD-10-CM | POA: Diagnosis not present

## 2022-04-19 DIAGNOSIS — Z992 Dependence on renal dialysis: Secondary | ICD-10-CM | POA: Diagnosis not present

## 2022-04-20 DIAGNOSIS — N186 End stage renal disease: Secondary | ICD-10-CM | POA: Diagnosis not present

## 2022-04-20 DIAGNOSIS — E8779 Other fluid overload: Secondary | ICD-10-CM | POA: Diagnosis not present

## 2022-04-20 DIAGNOSIS — Z992 Dependence on renal dialysis: Secondary | ICD-10-CM | POA: Diagnosis not present

## 2022-04-23 DIAGNOSIS — E8779 Other fluid overload: Secondary | ICD-10-CM | POA: Diagnosis not present

## 2022-04-23 DIAGNOSIS — Z992 Dependence on renal dialysis: Secondary | ICD-10-CM | POA: Diagnosis not present

## 2022-04-23 DIAGNOSIS — L650 Telogen effluvium: Secondary | ICD-10-CM | POA: Diagnosis not present

## 2022-04-23 DIAGNOSIS — N186 End stage renal disease: Secondary | ICD-10-CM | POA: Diagnosis not present

## 2022-04-24 DIAGNOSIS — N186 End stage renal disease: Secondary | ICD-10-CM | POA: Diagnosis not present

## 2022-04-24 DIAGNOSIS — E8779 Other fluid overload: Secondary | ICD-10-CM | POA: Diagnosis not present

## 2022-04-24 DIAGNOSIS — Z992 Dependence on renal dialysis: Secondary | ICD-10-CM | POA: Diagnosis not present

## 2022-04-25 DIAGNOSIS — N186 End stage renal disease: Secondary | ICD-10-CM | POA: Diagnosis not present

## 2022-04-25 DIAGNOSIS — Z992 Dependence on renal dialysis: Secondary | ICD-10-CM | POA: Diagnosis not present

## 2022-04-25 DIAGNOSIS — E8779 Other fluid overload: Secondary | ICD-10-CM | POA: Diagnosis not present

## 2022-04-26 DIAGNOSIS — Z992 Dependence on renal dialysis: Secondary | ICD-10-CM | POA: Diagnosis not present

## 2022-04-26 DIAGNOSIS — E8779 Other fluid overload: Secondary | ICD-10-CM | POA: Diagnosis not present

## 2022-04-26 DIAGNOSIS — N186 End stage renal disease: Secondary | ICD-10-CM | POA: Diagnosis not present

## 2022-04-27 DIAGNOSIS — E8779 Other fluid overload: Secondary | ICD-10-CM | POA: Diagnosis not present

## 2022-04-27 DIAGNOSIS — Z992 Dependence on renal dialysis: Secondary | ICD-10-CM | POA: Diagnosis not present

## 2022-04-27 DIAGNOSIS — N186 End stage renal disease: Secondary | ICD-10-CM | POA: Diagnosis not present

## 2022-04-28 DIAGNOSIS — E8779 Other fluid overload: Secondary | ICD-10-CM | POA: Diagnosis not present

## 2022-04-28 DIAGNOSIS — Z992 Dependence on renal dialysis: Secondary | ICD-10-CM | POA: Diagnosis not present

## 2022-04-28 DIAGNOSIS — N186 End stage renal disease: Secondary | ICD-10-CM | POA: Diagnosis not present

## 2022-04-30 DIAGNOSIS — E8779 Other fluid overload: Secondary | ICD-10-CM | POA: Diagnosis not present

## 2022-04-30 DIAGNOSIS — N186 End stage renal disease: Secondary | ICD-10-CM | POA: Diagnosis not present

## 2022-04-30 DIAGNOSIS — Z992 Dependence on renal dialysis: Secondary | ICD-10-CM | POA: Diagnosis not present

## 2022-05-01 DIAGNOSIS — Z992 Dependence on renal dialysis: Secondary | ICD-10-CM | POA: Diagnosis not present

## 2022-05-01 DIAGNOSIS — N186 End stage renal disease: Secondary | ICD-10-CM | POA: Diagnosis not present

## 2022-05-01 DIAGNOSIS — E8779 Other fluid overload: Secondary | ICD-10-CM | POA: Diagnosis not present

## 2022-05-02 DIAGNOSIS — E8779 Other fluid overload: Secondary | ICD-10-CM | POA: Diagnosis not present

## 2022-05-02 DIAGNOSIS — Z992 Dependence on renal dialysis: Secondary | ICD-10-CM | POA: Diagnosis not present

## 2022-05-02 DIAGNOSIS — N186 End stage renal disease: Secondary | ICD-10-CM | POA: Diagnosis not present

## 2022-05-03 DIAGNOSIS — I10 Essential (primary) hypertension: Secondary | ICD-10-CM | POA: Diagnosis not present

## 2022-05-03 DIAGNOSIS — G5613 Other lesions of median nerve, bilateral upper limbs: Secondary | ICD-10-CM | POA: Diagnosis not present

## 2022-05-03 DIAGNOSIS — R7309 Other abnormal glucose: Secondary | ICD-10-CM | POA: Diagnosis not present

## 2022-05-03 DIAGNOSIS — E1122 Type 2 diabetes mellitus with diabetic chronic kidney disease: Secondary | ICD-10-CM | POA: Diagnosis not present

## 2022-05-03 DIAGNOSIS — N186 End stage renal disease: Secondary | ICD-10-CM | POA: Diagnosis not present

## 2022-05-04 DIAGNOSIS — E8779 Other fluid overload: Secondary | ICD-10-CM | POA: Diagnosis not present

## 2022-05-04 DIAGNOSIS — Z992 Dependence on renal dialysis: Secondary | ICD-10-CM | POA: Diagnosis not present

## 2022-05-04 DIAGNOSIS — N186 End stage renal disease: Secondary | ICD-10-CM | POA: Diagnosis not present

## 2022-05-05 DIAGNOSIS — N186 End stage renal disease: Secondary | ICD-10-CM | POA: Diagnosis not present

## 2022-05-05 DIAGNOSIS — E8779 Other fluid overload: Secondary | ICD-10-CM | POA: Diagnosis not present

## 2022-05-05 DIAGNOSIS — Z992 Dependence on renal dialysis: Secondary | ICD-10-CM | POA: Diagnosis not present

## 2022-05-06 DIAGNOSIS — E8779 Other fluid overload: Secondary | ICD-10-CM | POA: Diagnosis not present

## 2022-05-06 DIAGNOSIS — N186 End stage renal disease: Secondary | ICD-10-CM | POA: Diagnosis not present

## 2022-05-06 DIAGNOSIS — Z992 Dependence on renal dialysis: Secondary | ICD-10-CM | POA: Diagnosis not present

## 2022-05-07 DIAGNOSIS — Z992 Dependence on renal dialysis: Secondary | ICD-10-CM | POA: Diagnosis not present

## 2022-05-07 DIAGNOSIS — N186 End stage renal disease: Secondary | ICD-10-CM | POA: Diagnosis not present

## 2022-05-07 DIAGNOSIS — E8779 Other fluid overload: Secondary | ICD-10-CM | POA: Diagnosis not present

## 2022-05-08 DIAGNOSIS — N186 End stage renal disease: Secondary | ICD-10-CM | POA: Diagnosis not present

## 2022-05-08 DIAGNOSIS — Z992 Dependence on renal dialysis: Secondary | ICD-10-CM | POA: Diagnosis not present

## 2022-05-08 DIAGNOSIS — E8779 Other fluid overload: Secondary | ICD-10-CM | POA: Diagnosis not present

## 2022-05-09 DIAGNOSIS — N186 End stage renal disease: Secondary | ICD-10-CM | POA: Diagnosis not present

## 2022-05-09 DIAGNOSIS — Z992 Dependence on renal dialysis: Secondary | ICD-10-CM | POA: Diagnosis not present

## 2022-05-09 DIAGNOSIS — E8779 Other fluid overload: Secondary | ICD-10-CM | POA: Diagnosis not present

## 2022-05-10 DIAGNOSIS — Z992 Dependence on renal dialysis: Secondary | ICD-10-CM | POA: Diagnosis not present

## 2022-05-10 DIAGNOSIS — E8779 Other fluid overload: Secondary | ICD-10-CM | POA: Diagnosis not present

## 2022-05-10 DIAGNOSIS — N186 End stage renal disease: Secondary | ICD-10-CM | POA: Diagnosis not present

## 2022-05-11 DIAGNOSIS — E8779 Other fluid overload: Secondary | ICD-10-CM | POA: Diagnosis not present

## 2022-05-11 DIAGNOSIS — N186 End stage renal disease: Secondary | ICD-10-CM | POA: Diagnosis not present

## 2022-05-11 DIAGNOSIS — Z992 Dependence on renal dialysis: Secondary | ICD-10-CM | POA: Diagnosis not present

## 2022-05-13 DIAGNOSIS — N186 End stage renal disease: Secondary | ICD-10-CM | POA: Diagnosis not present

## 2022-05-13 DIAGNOSIS — Z992 Dependence on renal dialysis: Secondary | ICD-10-CM | POA: Diagnosis not present

## 2022-05-13 DIAGNOSIS — K219 Gastro-esophageal reflux disease without esophagitis: Secondary | ICD-10-CM | POA: Diagnosis not present

## 2022-05-13 DIAGNOSIS — E785 Hyperlipidemia, unspecified: Secondary | ICD-10-CM | POA: Diagnosis not present

## 2022-05-13 DIAGNOSIS — E119 Type 2 diabetes mellitus without complications: Secondary | ICD-10-CM | POA: Diagnosis not present

## 2022-05-13 DIAGNOSIS — I10 Essential (primary) hypertension: Secondary | ICD-10-CM | POA: Diagnosis not present

## 2022-05-14 DIAGNOSIS — Z992 Dependence on renal dialysis: Secondary | ICD-10-CM | POA: Diagnosis not present

## 2022-05-14 DIAGNOSIS — N186 End stage renal disease: Secondary | ICD-10-CM | POA: Diagnosis not present

## 2022-05-14 DIAGNOSIS — E8779 Other fluid overload: Secondary | ICD-10-CM | POA: Diagnosis not present

## 2022-05-15 DIAGNOSIS — N186 End stage renal disease: Secondary | ICD-10-CM | POA: Diagnosis not present

## 2022-05-15 DIAGNOSIS — E8779 Other fluid overload: Secondary | ICD-10-CM | POA: Diagnosis not present

## 2022-05-15 DIAGNOSIS — Z992 Dependence on renal dialysis: Secondary | ICD-10-CM | POA: Diagnosis not present

## 2022-05-16 DIAGNOSIS — N186 End stage renal disease: Secondary | ICD-10-CM | POA: Diagnosis not present

## 2022-05-16 DIAGNOSIS — E8779 Other fluid overload: Secondary | ICD-10-CM | POA: Diagnosis not present

## 2022-05-16 DIAGNOSIS — Z992 Dependence on renal dialysis: Secondary | ICD-10-CM | POA: Diagnosis not present

## 2022-05-17 DIAGNOSIS — N186 End stage renal disease: Secondary | ICD-10-CM | POA: Diagnosis not present

## 2022-05-17 DIAGNOSIS — Z992 Dependence on renal dialysis: Secondary | ICD-10-CM | POA: Diagnosis not present

## 2022-05-17 DIAGNOSIS — E8779 Other fluid overload: Secondary | ICD-10-CM | POA: Diagnosis not present

## 2022-05-18 DIAGNOSIS — E8779 Other fluid overload: Secondary | ICD-10-CM | POA: Diagnosis not present

## 2022-05-18 DIAGNOSIS — N186 End stage renal disease: Secondary | ICD-10-CM | POA: Diagnosis not present

## 2022-05-18 DIAGNOSIS — Z992 Dependence on renal dialysis: Secondary | ICD-10-CM | POA: Diagnosis not present

## 2022-05-20 DIAGNOSIS — Z992 Dependence on renal dialysis: Secondary | ICD-10-CM | POA: Diagnosis not present

## 2022-05-20 DIAGNOSIS — E8779 Other fluid overload: Secondary | ICD-10-CM | POA: Diagnosis not present

## 2022-05-20 DIAGNOSIS — N186 End stage renal disease: Secondary | ICD-10-CM | POA: Diagnosis not present

## 2022-05-21 ENCOUNTER — Encounter (INDEPENDENT_AMBULATORY_CARE_PROVIDER_SITE_OTHER): Payer: Self-pay | Admitting: Gastroenterology

## 2022-05-21 ENCOUNTER — Ambulatory Visit (INDEPENDENT_AMBULATORY_CARE_PROVIDER_SITE_OTHER): Payer: Medicare Other | Admitting: Gastroenterology

## 2022-05-21 DIAGNOSIS — N186 End stage renal disease: Secondary | ICD-10-CM | POA: Diagnosis not present

## 2022-05-21 DIAGNOSIS — Z992 Dependence on renal dialysis: Secondary | ICD-10-CM | POA: Diagnosis not present

## 2022-05-21 DIAGNOSIS — E8779 Other fluid overload: Secondary | ICD-10-CM | POA: Diagnosis not present

## 2022-05-22 ENCOUNTER — Telehealth: Payer: Self-pay | Admitting: Gastroenterology

## 2022-05-22 DIAGNOSIS — Z992 Dependence on renal dialysis: Secondary | ICD-10-CM | POA: Diagnosis not present

## 2022-05-22 DIAGNOSIS — N186 End stage renal disease: Secondary | ICD-10-CM | POA: Diagnosis not present

## 2022-05-22 DIAGNOSIS — E8779 Other fluid overload: Secondary | ICD-10-CM | POA: Diagnosis not present

## 2022-05-22 NOTE — Telephone Encounter (Signed)
Patient left a message that she had an appointment on 4/25 and someone called to change to the 8th then called again to reschedule for the 18th and she can't do the 18th she has a conflict.  Wants a call back.

## 2022-05-23 DIAGNOSIS — Z992 Dependence on renal dialysis: Secondary | ICD-10-CM | POA: Diagnosis not present

## 2022-05-23 DIAGNOSIS — N186 End stage renal disease: Secondary | ICD-10-CM | POA: Diagnosis not present

## 2022-05-23 DIAGNOSIS — E8779 Other fluid overload: Secondary | ICD-10-CM | POA: Diagnosis not present

## 2022-05-24 DIAGNOSIS — E1129 Type 2 diabetes mellitus with other diabetic kidney complication: Secondary | ICD-10-CM | POA: Diagnosis not present

## 2022-05-25 DIAGNOSIS — N186 End stage renal disease: Secondary | ICD-10-CM | POA: Diagnosis not present

## 2022-05-25 DIAGNOSIS — E8779 Other fluid overload: Secondary | ICD-10-CM | POA: Diagnosis not present

## 2022-05-25 DIAGNOSIS — Z992 Dependence on renal dialysis: Secondary | ICD-10-CM | POA: Diagnosis not present

## 2022-05-28 DIAGNOSIS — N186 End stage renal disease: Secondary | ICD-10-CM | POA: Diagnosis not present

## 2022-05-28 DIAGNOSIS — E8779 Other fluid overload: Secondary | ICD-10-CM | POA: Diagnosis not present

## 2022-05-28 DIAGNOSIS — Z992 Dependence on renal dialysis: Secondary | ICD-10-CM | POA: Diagnosis not present

## 2022-05-29 DIAGNOSIS — Z992 Dependence on renal dialysis: Secondary | ICD-10-CM | POA: Diagnosis not present

## 2022-05-29 DIAGNOSIS — E8779 Other fluid overload: Secondary | ICD-10-CM | POA: Diagnosis not present

## 2022-05-29 DIAGNOSIS — N186 End stage renal disease: Secondary | ICD-10-CM | POA: Diagnosis not present

## 2022-05-30 DIAGNOSIS — Z992 Dependence on renal dialysis: Secondary | ICD-10-CM | POA: Diagnosis not present

## 2022-05-30 DIAGNOSIS — N186 End stage renal disease: Secondary | ICD-10-CM | POA: Diagnosis not present

## 2022-05-30 DIAGNOSIS — E8779 Other fluid overload: Secondary | ICD-10-CM | POA: Diagnosis not present

## 2022-05-31 ENCOUNTER — Ambulatory Visit: Payer: Medicare Other | Admitting: Gastroenterology

## 2022-05-31 DIAGNOSIS — E1122 Type 2 diabetes mellitus with diabetic chronic kidney disease: Secondary | ICD-10-CM | POA: Diagnosis not present

## 2022-05-31 DIAGNOSIS — I10 Essential (primary) hypertension: Secondary | ICD-10-CM | POA: Diagnosis not present

## 2022-05-31 DIAGNOSIS — R7309 Other abnormal glucose: Secondary | ICD-10-CM | POA: Diagnosis not present

## 2022-05-31 DIAGNOSIS — N186 End stage renal disease: Secondary | ICD-10-CM | POA: Diagnosis not present

## 2022-06-01 DIAGNOSIS — N186 End stage renal disease: Secondary | ICD-10-CM | POA: Diagnosis not present

## 2022-06-01 DIAGNOSIS — Z992 Dependence on renal dialysis: Secondary | ICD-10-CM | POA: Diagnosis not present

## 2022-06-01 DIAGNOSIS — E8779 Other fluid overload: Secondary | ICD-10-CM | POA: Diagnosis not present

## 2022-06-02 DIAGNOSIS — Z992 Dependence on renal dialysis: Secondary | ICD-10-CM | POA: Diagnosis not present

## 2022-06-02 DIAGNOSIS — E8779 Other fluid overload: Secondary | ICD-10-CM | POA: Diagnosis not present

## 2022-06-02 DIAGNOSIS — N186 End stage renal disease: Secondary | ICD-10-CM | POA: Diagnosis not present

## 2022-06-04 DIAGNOSIS — E8779 Other fluid overload: Secondary | ICD-10-CM | POA: Diagnosis not present

## 2022-06-04 DIAGNOSIS — G5622 Lesion of ulnar nerve, left upper limb: Secondary | ICD-10-CM | POA: Diagnosis not present

## 2022-06-04 DIAGNOSIS — G5603 Carpal tunnel syndrome, bilateral upper limbs: Secondary | ICD-10-CM | POA: Diagnosis not present

## 2022-06-04 DIAGNOSIS — N186 End stage renal disease: Secondary | ICD-10-CM | POA: Diagnosis not present

## 2022-06-04 DIAGNOSIS — Z992 Dependence on renal dialysis: Secondary | ICD-10-CM | POA: Diagnosis not present

## 2022-06-04 NOTE — Progress Notes (Addendum)
GI Office Note    Referring Provider: Carylon Perches, MD Primary Care Physician:  Carylon Perches, MD Primary Gastroenterologist: Dolores Frame, MD   Date:  06/05/2022  ID:  Margaret Huerta, DOB [birth year], MRN [id number]   Chief Complaint   Chief Complaint  Patient presents with   Follow-up    Pt began dialysis in August-home dialysis since December(5 days a week). Bowel movements stopped when just starting dialysis, went to one BM, and now pt feels like she has to have a BM and will have a solid discharge but sometimes 20 minutes late she will have loose discharge. Feeling of having to go to bathroom frequently. Sometimes when pt passes gas she does has a small amount of feces come out.    History of Present Illness  Margaret Huerta is a 77 y.o. 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  Chronic diarrhea/IBS, was recommended trial of Xifaxan however was too expensive.  Colonoscopy in November 2011 with sessile serrated polyp in the transverse colon.  Colonoscopy in 2016 did not reveal any polyps.  Recommended 7-year repeat.  Last office visit April 2023.  Patient denies any change in stool frequency.  3 bowel movements on the days when as many as 10 on her bad days.  Frequency does coincide with dietary choices.  Reported a poor appetite, forces herself to eat.  Avoids dairy products.  Using loperamide as needed.  Heartburn related to dietary choices as well.  Noted some occasional dysphagia but not significant.  Was due to have AV fistula placed to begin dialysis.  History of right nephrectomy in 2001 secondary to renal cancer.  Mood improved with antidepressant.  She was advised continue loperamide 2 mg daily as needed and consider surveillance  colonoscopy in October 2023.  Advised office visit in 1 year.  Colonoscopy October 2023: - two 8 mm polyp from transverse colon and cecum -8 mm polyp in sigmoid colon -Sigmoid and transverse diverticulosis -Significant looping of the colon -Nonbleeding internal hemorrhoids -Pathology revealed tubular adenomas -Recommended repeat in 5 years.  3 days post colonoscopy patient reported to the office some rectal bleeding with bowel movements without any rectal pain or abdominal pain.  She was recommended to go to the ED for evaluation of rectal bleeding.  Today: Once she started dialysis she did not have a BM for about 3 weeks and then afterwards she started having 1 BM daily. Recently she started having [place] 5 or 7 20 minutes after eating and having some abdominal discomfort with that. Amount usually varies. She reports feeling bloated and also the inability to go right now and constantly feels the need to go. Reports she is gluten sensitive and lactose intolerant and states that could contribute to her symptoms. Has been taking colace 50 mg nightly and that helps her have a stool in the morning up until recently.   Reports she fell 4/12 after syncopal episode at dialysis and has had pain in her LUQ. Lately she has felt pain to use the bathroom and then not able to go. She reports she has had some passage of feces with gas.   Reports a big bruise on her bottom since her fall.  Reports she had a fistula to her left arm that failed. States she had a PD  catheter placed and then it had to be removed 2 weeks later. She states that she had some scar tissue and hematomas present during the removal of her PD catheter. It was given her severe abdominal pain. This was in September 2023.   Has some occasional nausea but no vomiting. Reports she has been receiving IV iron at dialysis periodically and does get nauseas from that.  No melena or brbpr.   Current Outpatient Medications  Medication Sig  Dispense Refill   AREXVY 120 MCG/0.5ML injection      acetaminophen (TYLENOL) 500 MG tablet Take 1,000 mg by mouth every 6 (six) hours as needed (for pain.).     allopurinol (ZYLOPRIM) 300 MG tablet Take 300 mg by mouth in the morning.  12   ALPRAZolam (XANAX) 0.25 MG tablet Take 0.25 mg by mouth every Monday, Wednesday, and Friday with hemodialysis.     aspirin EC 81 MG tablet Take 81 mg by mouth in the morning. Swallow whole.     atenolol (TENORMIN) 50 MG tablet Take 50 mg by mouth 2 (two) times daily.     calcitRIOL (ROCALTROL) 0.25 MCG capsule Take 0.25 mcg by mouth in the morning.     Cholecalciferol (VITAMIN D3) 250 MCG (10000 UT) TABS Take 10,000 Units by mouth in the morning.     docusate sodium (COLACE) 50 MG capsule Take 50 mg by mouth at bedtime.     DULoxetine (CYMBALTA) 60 MG capsule Take 60 mg by mouth in the morning.     furosemide (LASIX) 40 MG tablet Take 80 mg by mouth in the morning. & 40 mg in the evening     gabapentin (NEURONTIN) 100 MG capsule Take 100 mg by mouth daily.     hydrALAZINE (APRESOLINE) 50 MG tablet Take 1.5 tablets (75 mg total) by mouth in the morning, at noon, and at bedtime. 135 tablet 3   isosorbide dinitrate (ISORDIL) 10 MG tablet Take 1 tablet (10 mg total) by mouth 2 (two) times daily. 180 tablet 3   NITROSTAT 0.4 MG SL tablet Place 1 tablet (0.4 mg total) under the tongue as needed. (Patient taking differently: Place 0.4 mg under the tongue every 5 (five) minutes x 3 doses as needed.) 25 tablet 3   rosuvastatin (CRESTOR) 10 MG tablet Take 1 tablet (10 mg total) by mouth daily.     sevelamer carbonate (RENVELA) 800 MG tablet Take 800 mg by mouth 3 (three) times daily.     sodium bicarbonate 650 MG tablet Take 650 mg by mouth 2 (two) times daily.     No current facility-administered medications for this visit.    Past Medical History:  Diagnosis Date   Anemia    Anxiety    Cervical cancer    Chronic bronchitis    "get it q yr"   Chronic lower  back pain    Depression    Febrile seizure    "as a child"   Fibromyalgia    GERD (gastroesophageal reflux disease)    Gout    Hemodialysis patient    History of blood transfusion    "S/P tonsillectomy"   History of hiatal hernia    Hx of cardiovascular stress test 05/14/2014   false positive Myoview   Hyperlipidemia    Hypertension    Migraine    "monthly" (05/26/2014)   Neuropathy    Osteoporosis    Parathyroid disease    "my PHT levels run high; I can't take calcium"   Pneumonia "several  times"   Renal cancer    "right"-s/p nephrectomy   Renal insufficiency    "left kidney works at 40-60%" (05/26/2014)   Sleep apnea    "wore mask for 2 months; could not take it" (05/26/2014)   Type II diabetes mellitus    Typhus fever    "as a child"    Past Surgical History:  Procedure Laterality Date   ABDOMINAL HERNIA REPAIR  02/2013   "in [place]"   ABDOMINAL HYSTERECTOMY  1977   "partial"   APPENDECTOMY     AV FISTULA PLACEMENT Left 06/13/2021   Procedure: LEFT ARM ARTERIOVENOUS FISTULA CREATION;  Surgeon: Larina Earthly, MD;  Location: [facility];  Service: Vascular;  Laterality: Left;   BACK SURGERY     BILATERAL SALPINGOOPHORECTOMY Bilateral ~ 1993   CARDIAC CATHETERIZATION  2003, April 2016   normal coronaries   CATARACT EXTRACTION W/ INTRAOCULAR LENS  IMPLANT, BILATERAL  2015   COLONOSCOPY N/A 11/25/2014   Procedure: COLONOSCOPY;  Surgeon: Malissa Hippo, MD;  Location: [facility];  Service: Endoscopy;  Laterality: N/A;   COLONOSCOPY WITH PROPOFOL N/A 12/12/2021   Procedure: COLONOSCOPY WITH PROPOFOL;  Surgeon: Dolores Frame, MD;  Location: [facility];  Service: Gastroenterology;  Laterality: N/A;  130 ASA 3 patient has dialysis Mon Wed & Fri, per Soledad Gerlach pt knows new arrival time   CYSTOSCOPY W/ Rey MANIPULATION  1988   DIAGNOSTIC LAPAROSCOPY  1975   DILATION AND CURETTAGE OF UTERUS     ESOPHAGOGASTRODUODENOSCOPY N/A 11/25/2014   Procedure:  ESOPHAGOGASTRODUODENOSCOPY (EGD);  Surgeon: Malissa Hippo, MD;  Location: [facility];  Service: Endoscopy;  Laterality: N/A;  9:30 - moved to 11:25 - [name] to notify pt   ESOPHAGOGASTRODUODENOSCOPY (EGD) WITH ESOPHAGEAL DILATION  1997   EYE SURGERY     GASTRIC BYPASS  2012   HERNIA REPAIR     2015   HIATAL HERNIA REPAIR  1996   INCONTINENCE SURGERY  1983   INSERTION OF DIALYSIS CATHETER Right 09/19/2021   Procedure: INSERTION OF TUNNELED DIALYSIS CATHETER;  Surgeon: Larina Earthly, MD;  Location: [facility];  Service: Vascular;  Laterality: Right;   KNEE ARTHROSCOPY Left 2000's   LAPAROSCOPIC CHOLECYSTECTOMY  1990's   LEFT HEART CATHETERIZATION WITH CORONARY ANGIOGRAM N/A 05/27/2014   Procedure: LEFT HEART CATHETERIZATION WITH CORONARY ANGIOGRAM;  Surgeon: Runell Gess, MD;  Location: [facility] CATH LAB;  Service: Cardiovascular;  Laterality: N/A;   LIGATION OF ARTERIOVENOUS  FISTULA Left 09/19/2021   Procedure: LIGATION OF ARTERIOVENOUS  FISTULA;  Surgeon: Larina Earthly, MD;  Location: [facility];  Service: Vascular;  Laterality: Left;   LUMBAR DISC SURGERY  2001   "herniated discs"   MOUTH SURGERY  1991   "drilled into gum and put tooth implants uppers"   NEPHRECTOMY Right 2002   "cancer"   POLYPECTOMY  12/12/2021   Procedure: POLYPECTOMY;  Surgeon: Dolores Frame, MD;  Location: [facility];  Service: Gastroenterology;;   TMJ ARTHROPLASTY  1988   TONSILLECTOMY  1976   UPPER GI ENDOSCOPY      Family History  Problem Relation Age of Onset   Emphysema Father    Heart disease Father    Clotting disorder Mother    Diabetes Mother    Kidney disease Mother    Allergies Other        whole family per pt   Lung cancer Maternal Uncle    Alcohol abuse Maternal Uncle  Allergies as of 06/05/2022 - Review Complete 06/05/2022  Allergen Reaction Noted   Demerol [meperidine] Other (See Comments) 06/10/2012   Lyrica [pregabalin] Swelling 08/08/2017   Vicodin  [hydrocodone-acetaminophen] Other (See Comments) 06/10/2012   Ace inhibitors Cough 07/07/2014   Aleve [naproxen] Other (See Comments)    Codeine Other (See Comments)    Contrast media [iodinated contrast media] Nausea Only and Other (See Comments) 06/01/2016   Fentanyl Itching 05/04/2014   Gluten meal Diarrhea 12/11/2021   Lactose intolerance (gi) Other (See Comments) 12/11/2021   Morphine Nausea And Vomiting    Neurontin [gabapentin] Nausea Only, Swelling, and Other (See Comments) 06/05/2021   Norvasc [amlodipine] Swelling 09/24/2019   Nsaids Other (See Comments) 06/05/2021   Other Other (See Comments) 06/05/2021   Dilaudid [hydromorphone hcl] Itching, Nausea And Vomiting, and Rash 06/10/2012    Social History   Socioeconomic History   Marital status: Married    Spouse name: Circe Iwai   Number of children: 1   Years of education: 12   Highest education level: 12th grade  Occupational History   Occupation: Housewife  Tobacco Use   Smoking status: Never    Passive exposure: Never   Smokeless tobacco: Never  Vaping Use   Vaping Use: Never used  Substance and Sexual Activity   Alcohol use: No    Alcohol/week: 0.0 standard drinks of alcohol   Drug use: No   Sexual activity: Yes    Partners: Male    Birth control/protection: Surgical  Other Topics Concern   Not on file  Social History Narrative   Not on file   Social Determinants of Health   Financial Resource Strain: Low Risk  (09/18/2021)   Overall Financial Resource Strain (CARDIA)    Difficulty of Paying Living Expenses: Not hard at all  Food Insecurity: No Food Insecurity (11/07/2021)   Hunger Vital Sign    Worried About Running Out of Food in the Last Year: Never true    Ran Out of Food in the Last Year: Never true  Transportation Needs: No Transportation Needs (11/07/2021)   PRAPARE - Administrator, Civil Service (Medical): No    Lack of Transportation (Non-Medical): No  Physical Activity: Inactive  (09/18/2021)   Exercise Vital Sign    Days of Exercise per Week: 0 days    Minutes of Exercise per Session: 0 min  Stress: No Stress Concern Present (09/18/2021)   [employer] of Occupational Health - Occupational Stress Questionnaire    Feeling of Stress : Only a little  Social Connections: Socially Integrated (09/18/2021)   Social Connection and Isolation Panel [NHANES]    Frequency of Communication with Friends and Family: More than three times a week    Frequency of Social Gatherings with Friends and Family: More than three times a week    Attends Religious Services: More than 4 times per year    Active Member of [employer] or Organizations: Yes    Attends Banker Meetings: 1 to 4 times per year    Marital Status: Married     Review of Systems   Gen: Denies fever, chills, anorexia. Denies fatigue, weakness, weight loss.  CV: Denies chest pain, palpitations, syncope, peripheral edema, and claudication. Resp: Denies dyspnea at rest, cough, wheezing, coughing up blood, and pleurisy. GI: See HPI Derm: Denies rash, itching, dry skin Psych: Denies depression, anxiety, memory loss, confusion. No homicidal or suicidal ideation.  Heme: Denies bruising, bleeding, and enlarged lymph nodes.   Physical Exam  BP 123/64   Pulse 62   Temp 97.8 F (36.6 C)   Ht 5\' 2"  (1.575 m)   Wt 171 lb 1.6 oz (77.6 kg)   BMI 31.29 kg/m   General:   Alert and oriented. No distress noted. Pleasant and cooperative.  Head:  Normocephalic and atraumatic. Eyes:  Conjuctiva clear without scleral icterus. Mouth:  Oral mucosa pink and moist. Good dentition. No lesions. Chest: Dialysis catheter present with C/D/I dressing.  Lungs:  Clear to auscultation bilaterally. No wheezes, rales, or rhonchi. No distress. Pain to left lateral rib cage on exam.  Heart:  S1, S2 present without murmurs appreciated.  Abdomen:  +BS, soft, non-distended. TTP to LUQ and LLQ. Mild ttp to RLQ. Small soft tissue  swelling to mid abdomen. No rebound or guarding. No HSM or masses noted. Rectal: deferred Msk:  Symmetrical without gross deformities. Normal posture. Extremities:  Without edema. Neurologic:  Alert and  oriented x4 Psych:  Alert and cooperative. Normal mood and affect.   Assessment  CARALINA CHINEA is a 77 y.o. female with a history of Roux-en-Y gastric bypass, anemia, anxiety, depression, seizures, fibromyalgia, GERD, gout, HLD, HTN, parathyroid disease, type 2 diabetes, renal cancer s/p right nephrectomy 2001, and end-stage renal disease on dialysis (5 days per week) presenting today with concern   History of chronic diarrhea, IBS, new constipation and bloating, left sided abdominal pain: Chronic diarrhea suspected to be secondary to Roux-en-Y gastric bypass and IBS.  Previous workup negative for microscopic colitis. Since starting dialysis she has had more on an issue with constipation. For 3 weeks after dialysis she did not have a BM. She started nightly colace and will usually have a BM every morning. More recently she has began having some urgency after meals with [facility] 5/7 stools. Not currently tacking dicyclomine.  Her most significant complaint is feeling bloated and having the urge to go at times but feeling unable to go.  She feels as though some of her stools after meals is likely secondary to dietary choices given she is lactose intolerant and gluten sensitive.  At this point is difficult to say whether or not she experiencing symptoms related to her IBS with diarrhea or if this is constipation therefore we will evaluate for possible stool burden with KUB.  If evidence of constipation evident we will likely recommend MiraLAX 17 g nightly in addition to her stool softener.  If no evidence of constipation she can continue taking her nightly Colace and will need to follow a stricter diet by avoiding common gas producing foods including gluten and lactose containing products.  It is possible  that some of her prior abdominal surgeries and complications from PD catheter insertion and scar tissue could be contributing to her bowel dysfunction.  Her postprandial stools could be secondary to absorption issues given her history of Roux-en-Y gastric bypass.  History of colon polyps: History of sessile serrated polyp in November 2011 and most recent colonoscopy in October 2023 with removal of 3 tubular adenomas.  Recommended repeat in 5 years.  Will reassess clinical status at that time and could perform, health permitting.  PLAN   KUB Continue colace 50 mg nightly May start miralax 17g nightly if evidence of constipation on KUB.  Avoid trigger foods, particularly dairy products and gluten Follow up to be determine after imaging    Brooke Bonito, MSN, FNP-BC, AGACNP-BC [facility]  I have reviewed the note and agree with the APP's assessment as described in this  progress note  Katrinka Blazing, MD Gastroenterology and Hepatology [facility]

## 2022-06-05 ENCOUNTER — Encounter: Payer: Self-pay | Admitting: Gastroenterology

## 2022-06-05 ENCOUNTER — Ambulatory Visit (HOSPITAL_COMMUNITY)
Admission: RE | Admit: 2022-06-05 | Discharge: 2022-06-05 | Disposition: A | Discharge status: Home / Self Care | Payer: Medicare Other | Source: Ambulatory Visit | Attending: Gastroenterology | Admitting: Gastroenterology

## 2022-06-05 ENCOUNTER — Ambulatory Visit (INDEPENDENT_AMBULATORY_CARE_PROVIDER_SITE_OTHER): Payer: Medicare Other | Admitting: Gastroenterology

## 2022-06-05 VITALS — BP 123/64 | HR 62 | Temp 97.8°F | Ht 62.0 in | Wt 171.1 lb

## 2022-06-05 DIAGNOSIS — R1032 Left lower quadrant pain: Secondary | ICD-10-CM | POA: Insufficient documentation

## 2022-06-05 DIAGNOSIS — K529 Noninfective gastroenteritis and colitis, unspecified: Secondary | ICD-10-CM

## 2022-06-05 DIAGNOSIS — R140 Abdominal distension (gaseous): Secondary | ICD-10-CM | POA: Diagnosis not present

## 2022-06-05 DIAGNOSIS — R1012 Left upper quadrant pain: Secondary | ICD-10-CM

## 2022-06-05 DIAGNOSIS — Z992 Dependence on renal dialysis: Secondary | ICD-10-CM | POA: Diagnosis not present

## 2022-06-05 DIAGNOSIS — Z86010 Personal history of colonic polyps: Secondary | ICD-10-CM

## 2022-06-05 DIAGNOSIS — K5900 Constipation, unspecified: Secondary | ICD-10-CM | POA: Diagnosis not present

## 2022-06-05 DIAGNOSIS — K588 Other irritable bowel syndrome: Secondary | ICD-10-CM

## 2022-06-05 DIAGNOSIS — R1030 Lower abdominal pain, unspecified: Secondary | ICD-10-CM | POA: Diagnosis not present

## 2022-06-05 DIAGNOSIS — N186 End stage renal disease: Secondary | ICD-10-CM | POA: Diagnosis not present

## 2022-06-05 DIAGNOSIS — E8779 Other fluid overload: Secondary | ICD-10-CM | POA: Diagnosis not present

## 2022-06-05 NOTE — Patient Instructions (Addendum)
We are scheduling for an x-ray of your abdomen to assess for constipation as a cause of your abdominal pain and bloating.  Continue taking Colace 50 mg nightly for now.  If evidence of constipation on your imaging he may need to start MiraLAX daily.  We will reach out with any further recommendations after seeing results of your abdominal x-ray.  It was a pleasure to see you today. I want to create trusting relationships with patients. If you receive a survey regarding your visit,  I greatly appreciate you taking time to fill this out on paper or through your MyChart. I value your feedback.  Brooke Bonito, MSN, FNP-BC, AGACNP-BC [facility]

## 2022-06-06 ENCOUNTER — Encounter: Payer: Self-pay | Admitting: Cardiology

## 2022-06-06 ENCOUNTER — Ambulatory Visit: Payer: Medicare Other | Attending: Cardiology | Admitting: Cardiology

## 2022-06-06 VITALS — BP 108/60 | HR 68 | Ht 62.0 in | Wt 170.0 lb

## 2022-06-06 DIAGNOSIS — R0789 Other chest pain: Secondary | ICD-10-CM

## 2022-06-06 DIAGNOSIS — I10 Essential (primary) hypertension: Secondary | ICD-10-CM

## 2022-06-06 DIAGNOSIS — Z992 Dependence on renal dialysis: Secondary | ICD-10-CM | POA: Diagnosis not present

## 2022-06-06 DIAGNOSIS — E782 Mixed hyperlipidemia: Secondary | ICD-10-CM | POA: Diagnosis not present

## 2022-06-06 DIAGNOSIS — E8779 Other fluid overload: Secondary | ICD-10-CM | POA: Diagnosis not present

## 2022-06-06 DIAGNOSIS — N186 End stage renal disease: Secondary | ICD-10-CM | POA: Diagnosis not present

## 2022-06-06 MED ORDER — ISOSORBIDE DINITRATE 20 MG PO TABS: 20.0000 mg | ORAL_TABLET | Freq: Two times a day (BID) | ORAL | 2 refills | Status: DC

## 2022-06-06 NOTE — Patient Instructions (Addendum)
Medication Instructions:  Your physician has recommended you make the following change in your medication:  Stop hydralazine Increase isosorbide dinitrate to 20 mg twice daily Continue other medications the same  Labwork: none  Testing/Procedures: none  Follow-Up: Your physician recommends that you schedule a follow-up appointment in: 6 months  Any Other Special Instructions Will Be Listed Below (If Applicable).  If you need a refill on your cardiac medications before your next appointment, please call your pharmacy.

## 2022-06-06 NOTE — Progress Notes (Signed)
Clinical Summary Ms. Mccalla is a 77 y.o.female seen today for follow up of the following medical problems.      1. History of chest pain Patient had left heart cath in 2003 (a J.  Ganji) and in 2016.  For abnormal stress test.  This both showed normal coronary arteries   07/2019 mild ischemia, low risk - previously has responved to SL NG, symptoms improved on isordil  - some chest pains at times, most often towards at end of HD - some exertional chest paisn, takes NG about 3 times week.  - overal symptoms are similar to her chronic chest pain symptoms   2. HTN Compliant with meds - has had chronic issues with orthostatic hypotension, appear somewhat worsened since starting dialysis - low bp's at times, some orthostatic symptoms.   - low bp's during HD, nephrology had recommended stopping hydralazine. Ongoing orthostatic symptoms, had orthostatic fall after HD.     3. Hyperlipidemia - labs followed by pcp - she is on crestor 08/2021 TC 127 TG 121 HDL 53 LDL 53           4. ESRD - Dr Wolfgang Phoenix follows - left sided fistula developed steal syndrome. - doing home peritoneal HD. Low bp's at times to 80s to 90s with standing       4. DM2   5. OSA  - not comfortable with cpap, not using.    6. Orthostatic syncope -  chronic orthostatic dizzienss at times   7. Mild MR - by echo 2021   SH: husband diagnosed colon cancer, due for surgery this month June 2023 Past Medical History:  Diagnosis Date   Anemia    Anxiety    Cervical cancer    Chronic bronchitis    "get it q yr"   Chronic lower back pain    Depression    Febrile seizure    "as a child"   Fibromyalgia    GERD (gastroesophageal reflux disease)    Gout    Hemodialysis patient    History of blood transfusion    "S/P tonsillectomy"   History of hiatal hernia    Hx of cardiovascular stress test 05/14/2014   false positive Myoview   Hyperlipidemia    Hypertension    Migraine    "monthly"  (05/26/2014)   Neuropathy    Osteoporosis    Parathyroid disease    "my PHT levels run high; I can't take calcium"   Pneumonia "several times"   Renal cancer    "right"-s/p nephrectomy   Renal insufficiency    "left kidney works at 40-60%" (05/26/2014)   Sleep apnea    "wore mask for 2 months; could not take it" (05/26/2014)   Type II diabetes mellitus    Typhus fever    "as a child"     Allergies  Allergen Reactions   Demerol [Meperidine] Other (See Comments)    Stop breathing   Lyrica [Pregabalin] Swelling    Stomach lesion   Vicodin [Hydrocodone-Acetaminophen] Other (See Comments)    Stop breathing   Ace Inhibitors Cough   Aleve [Naproxen] Other (See Comments)    stomach lesion   Codeine Other (See Comments)    hallucination, confused   Contrast Media [Iodinated Contrast Media] Nausea Only and Other (See Comments)    MRI dye. Shaky, nausea   Fentanyl Itching   Gluten Meal Diarrhea    GI-upset   Lactose Intolerance (Gi) Other (See Comments)    Gi  upset   Morphine Nausea And Vomiting   Neurontin [Gabapentin] Nausea Only, Swelling and Other (See Comments)    Confusion. Felt like in a dream. foggy   Norvasc [Amlodipine] Swelling   Nsaids Other (See Comments)    Severe chronic kidney disease   Other Other (See Comments)    Anesthesia--prolonged sleep inertia (grogginess/disorientation/cognitive impairment)   Dilaudid [Hydromorphone Hcl] Itching, Nausea And Vomiting and Rash     Current Outpatient Medications  Medication Sig Dispense Refill   acetaminophen (TYLENOL) 500 MG tablet Take 1,000 mg by mouth every 6 (six) hours as needed (for pain.).     allopurinol (ZYLOPRIM) 300 MG tablet Take 300 mg by mouth in the morning.  12   ALPRAZolam (XANAX) 0.25 MG tablet Take 0.25 mg by mouth every Monday, Wednesday, and Friday with hemodialysis.     AREXVY 120 MCG/0.5ML injection      aspirin EC 81 MG tablet Take 81 mg by mouth in the morning. Swallow whole.     atenolol  (TENORMIN) 50 MG tablet Take 50 mg by mouth 2 (two) times daily.     calcitRIOL (ROCALTROL) 0.25 MCG capsule Take 0.25 mcg by mouth in the morning.     Cholecalciferol (VITAMIN D3) 250 MCG (10000 UT) TABS Take 10,000 Units by mouth in the morning.     docusate sodium (COLACE) 50 MG capsule Take 50 mg by mouth at bedtime.     DULoxetine (CYMBALTA) 60 MG capsule Take 60 mg by mouth in the morning.     furosemide (LASIX) 40 MG tablet Take 80 mg by mouth in the morning. & 40 mg in the evening     gabapentin (NEURONTIN) 100 MG capsule Take 100 mg by mouth daily.     hydrALAZINE (APRESOLINE) 50 MG tablet Take 1.5 tablets (75 mg total) by mouth in the morning, at noon, and at bedtime. 135 tablet 3   isosorbide dinitrate (ISORDIL) 10 MG tablet Take 1 tablet (10 mg total) by mouth 2 (two) times daily. 180 tablet 3   NITROSTAT 0.4 MG SL tablet Place 1 tablet (0.4 mg total) under the tongue as needed. (Patient taking differently: Place 0.4 mg under the tongue every 5 (five) minutes x 3 doses as needed.) 25 tablet 3   rosuvastatin (CRESTOR) 10 MG tablet Take 1 tablet (10 mg total) by mouth daily.     sevelamer carbonate (RENVELA) 800 MG tablet Take 800 mg by mouth 3 (three) times daily.     sodium bicarbonate 650 MG tablet Take 650 mg by mouth 2 (two) times daily.     No current facility-administered medications for this visit.     Past Surgical History:  Procedure Laterality Date   ABDOMINAL HERNIA REPAIR  02/2013   "in [place]"   ABDOMINAL HYSTERECTOMY  1977   "partial"   APPENDECTOMY     AV FISTULA PLACEMENT Left 06/13/2021   Procedure: LEFT ARM ARTERIOVENOUS FISTULA CREATION;  Surgeon: Larina Earthly, MD;  Location: [facility];  Service: Vascular;  Laterality: Left;   BACK SURGERY     BILATERAL SALPINGOOPHORECTOMY Bilateral ~ 1993   CARDIAC CATHETERIZATION  2003, April 2016   normal coronaries   CATARACT EXTRACTION W/ INTRAOCULAR LENS  IMPLANT, BILATERAL  2015   COLONOSCOPY N/A 11/25/2014    Procedure: COLONOSCOPY;  Surgeon: Malissa Hippo, MD;  Location: [facility];  Service: Endoscopy;  Laterality: N/A;   COLONOSCOPY WITH PROPOFOL N/A 12/12/2021   Procedure: COLONOSCOPY WITH PROPOFOL;  Surgeon: Dolores Frame,  MD;  Location: [facility];  Service: Gastroenterology;  Laterality: N/A;  130 ASA 3 patient has dialysis Mon Wed & Fri, per Soledad Gerlach pt knows new arrival time   CYSTOSCOPY W/ [place] MANIPULATION  1988   DIAGNOSTIC LAPAROSCOPY  1975   DILATION AND CURETTAGE OF UTERUS     ESOPHAGOGASTRODUODENOSCOPY N/A 11/25/2014   Procedure: ESOPHAGOGASTRODUODENOSCOPY (EGD);  Surgeon: Malissa Hippo, MD;  Location: [facility];  Service: Endoscopy;  Laterality: N/A;  9:30 - moved to 11:25 - [name] to notify pt   ESOPHAGOGASTRODUODENOSCOPY (EGD) WITH ESOPHAGEAL DILATION  1997   EYE SURGERY     GASTRIC BYPASS  2012   HERNIA REPAIR     2015   HIATAL HERNIA REPAIR  1996   INCONTINENCE SURGERY  1983   INSERTION OF DIALYSIS CATHETER Right 09/19/2021   Procedure: INSERTION OF TUNNELED DIALYSIS CATHETER;  Surgeon: Larina Earthly, MD;  Location: [facility];  Service: Vascular;  Laterality: Right;   KNEE ARTHROSCOPY Left 2000's   LAPAROSCOPIC CHOLECYSTECTOMY  1990's   LEFT HEART CATHETERIZATION WITH CORONARY ANGIOGRAM N/A 05/27/2014   Procedure: LEFT HEART CATHETERIZATION WITH CORONARY ANGIOGRAM;  Surgeon: Runell Gess, MD;  Location: [facility] CATH LAB;  Service: Cardiovascular;  Laterality: N/A;   LIGATION OF ARTERIOVENOUS  FISTULA Left 09/19/2021   Procedure: LIGATION OF ARTERIOVENOUS  FISTULA;  Surgeon: Larina Earthly, MD;  Location: [facility];  Service: Vascular;  Laterality: Left;   LUMBAR DISC SURGERY  2001   "herniated discs"   MOUTH SURGERY  1991   "drilled into gum and put tooth implants uppers"   NEPHRECTOMY Right 2002   "cancer"   POLYPECTOMY  12/12/2021   Procedure: POLYPECTOMY;  Surgeon: Dolores Frame, MD;  Location: [facility];  Service: Gastroenterology;;   TMJ  ARTHROPLASTY  1988   TONSILLECTOMY  1976   UPPER GI ENDOSCOPY       Allergies  Allergen Reactions   Demerol [Meperidine] Other (See Comments)    Stop breathing   Lyrica [Pregabalin] Swelling    Stomach lesion   Vicodin [Hydrocodone-Acetaminophen] Other (See Comments)    Stop breathing   Ace Inhibitors Cough   Aleve [Naproxen] Other (See Comments)    stomach lesion   Codeine Other (See Comments)    hallucination, confused   Contrast Media [Iodinated Contrast Media] Nausea Only and Other (See Comments)    MRI dye. Shaky, nausea   Fentanyl Itching   Gluten Meal Diarrhea    GI-upset   Lactose Intolerance (Gi) Other (See Comments)    Gi upset   Morphine Nausea And Vomiting   Neurontin [Gabapentin] Nausea Only, Swelling and Other (See Comments)    Confusion. Felt like in a dream. foggy   Norvasc [Amlodipine] Swelling   Nsaids Other (See Comments)    Severe chronic kidney disease   Other Other (See Comments)    Anesthesia--prolonged sleep inertia (grogginess/disorientation/cognitive impairment)   Dilaudid [Hydromorphone Hcl] Itching, Nausea And Vomiting and Rash      Family History  Problem Relation Age of Onset   Emphysema Father    Heart disease Father    Clotting disorder Mother    Diabetes Mother    Kidney disease Mother    Allergies Other        whole family per pt   Lung cancer Maternal Uncle    Alcohol abuse Maternal Uncle      Social History Ms. Manthe reports that she has never smoked. She has never been exposed  to tobacco smoke. She has never used smokeless tobacco. Ms. Halberg reports no history of alcohol use.   Review of Systems CONSTITUTIONAL: No weight loss, fever, chills, weakness or fatigue.  HEENT: Eyes: No visual loss, blurred vision, double vision or yellow sclerae.No hearing loss, sneezing, congestion, runny nose or sore throat.  SKIN: No rash or itching.  CARDIOVASCULAR: per hpi RESPIRATORY: No shortness of breath, cough or sputum.   GASTROINTESTINAL: No anorexia, nausea, vomiting or diarrhea. No abdominal pain or blood.  GENITOURINARY: No burning on urination, no polyuria NEUROLOGICAL: No headache, dizziness, syncope, paralysis, ataxia, numbness or tingling in the extremities. No change in bowel or bladder control.  MUSCULOSKELETAL: No muscle, back pain, joint pain or stiffness.  LYMPHATICS: No enlarged nodes. No history of splenectomy.  PSYCHIATRIC: No history of depression or anxiety.  ENDOCRINOLOGIC: No reports of sweating, cold or heat intolerance. No polyuria or polydipsia.  Marland Kitchen   Physical Examination Today's Vitals   06/06/22 1311  BP: 108/60  Pulse: 68  SpO2: 95%  Weight: 170 lb (77.1 kg)  Height: 5\' 2"  (1.575 m)   Body mass index is 31.09 kg/m.  Gen: resting comfortably, no acute distress HEENT: no scleral icterus, pupils equal round and reactive, no palptable cervical adenopathy,  CV: RRR, no m/rg, no jvd Resp: Clear to auscultation bilaterally GI: abdomen is soft, non-tender, non-distended, normal bowel sounds, no hepatosplenomegaly MSK: extremities are warm, no edema.  Skin: warm, no rash Neuro:  no focal deficits Psych: appropriate affect   Diagnostic Studies Nuclear stress test 08/05/2019 There was no ST segment deviation noted during stress. Findings consistent with mild apical ischemia. This is a low risk study. The left ventricular ejection fraction is hyperdynamic (>65%).   Echo 08/05/19 . Left ventricular ejection fraction, by estimation, is 60 to 65%. The  left ventricle has normal function. The left ventricle has no regional  wall motion abnormalities. There is mild left ventricular hypertrophy.  Left ventricular diastolic parameters  are consistent with Grade I diastolic dysfunction (impaired relaxation).   2. Right ventricular systolic function is normal. The right ventricular  size is normal. There is mildly elevated pulmonary artery systolic  pressure.   3. Left atrial size  was severely dilated.   4. Right atrial size was mildly dilated.   5. The mitral valve is normal in structure. Mild mitral valve  regurgitation. No evidence of mitral stenosis.   6. The aortic valve is tricuspid. Aortic valve regurgitation is not  visualized. No aortic stenosis is present.   7. The inferior vena cava is normal in size with greater than 50%  respiratory variability, suggesting right atrial pressure of 3 mmHg.   8. Left to right atrial shunting noted by color Doppler, probable PFO.      11/2019 renal artery [place] Summary:  Largest Aortic Diameter: 2.2 cm     Renal:     Right: Right kidney removed 2002.  Left:  Normal size of left kidney. Abnormal left Resisitve Index.         Normal cortical thickness of the left kidney. No evidence of         left renal artery stenosis. LRV flow present.  Mesenteric:  Normal Celiac artery and Superior Mesenteric artery findings.     Assessment and Plan  1. Chest pain - long history of symptoms with overall benign ischemic testing, most recently 07/2019 -has improved in general on nitrates.  - some ongonig symptoms increase isordil to 20mg  bid   2. HTN -  ongoing issues with low bp's and orthostatic falls, particularly after HD - can d/c hydralazine and monitor bp's and symptoms   3. Hyperlipidemia - she is at goal, continue current meds  4.OSA - followed by Dr Vassie Loll, plans to repeat home study    Antoine Poche, M.D.

## 2022-06-07 ENCOUNTER — Ambulatory Visit (INDEPENDENT_AMBULATORY_CARE_PROVIDER_SITE_OTHER): Payer: Medicare Other | Admitting: Gastroenterology

## 2022-06-07 DIAGNOSIS — N186 End stage renal disease: Secondary | ICD-10-CM | POA: Diagnosis not present

## 2022-06-07 DIAGNOSIS — Z992 Dependence on renal dialysis: Secondary | ICD-10-CM | POA: Diagnosis not present

## 2022-06-07 DIAGNOSIS — E8779 Other fluid overload: Secondary | ICD-10-CM | POA: Diagnosis not present

## 2022-06-08 DIAGNOSIS — Z992 Dependence on renal dialysis: Secondary | ICD-10-CM | POA: Diagnosis not present

## 2022-06-08 DIAGNOSIS — N186 End stage renal disease: Secondary | ICD-10-CM | POA: Diagnosis not present

## 2022-06-08 DIAGNOSIS — E8779 Other fluid overload: Secondary | ICD-10-CM | POA: Diagnosis not present

## 2022-06-11 ENCOUNTER — Telehealth (INDEPENDENT_AMBULATORY_CARE_PROVIDER_SITE_OTHER): Payer: Self-pay | Admitting: *Deleted

## 2022-06-11 ENCOUNTER — Other Ambulatory Visit: Payer: Self-pay | Admitting: Gastroenterology

## 2022-06-11 DIAGNOSIS — E8779 Other fluid overload: Secondary | ICD-10-CM | POA: Diagnosis not present

## 2022-06-11 DIAGNOSIS — Z992 Dependence on renal dialysis: Secondary | ICD-10-CM | POA: Diagnosis not present

## 2022-06-11 DIAGNOSIS — R1032 Left lower quadrant pain: Secondary | ICD-10-CM

## 2022-06-11 DIAGNOSIS — N186 End stage renal disease: Secondary | ICD-10-CM | POA: Diagnosis not present

## 2022-06-11 MED ORDER — ONDANSETRON 4 MG PO TBDP: 4.0000 mg | ORAL_TABLET | Freq: Three times a day (TID) | ORAL | 0 refills | Status: DC | PRN

## 2022-06-11 NOTE — Telephone Encounter (Signed)
Patient seen 06/05/22. States her pain is getting worse. Has sharp pain in middle of abdomen that radiates to left side. States it last about 5 seconds. Has nausea off and on but nausea is worse when she has the pain. Takes mylanta for nausea. It helps some.has constant feeling of having to have BM. Yesterday had BM every or so. None today. Took one half capful of miralax on Friday and Saturday night and taking 2 colace every night. She states she has had several surgeries last year and had some staples up in and wonders if they have moved and that is what is causing her pain. She states all surgeries were done at [facility] last year.   [phone]

## 2022-06-11 NOTE — Telephone Encounter (Signed)
Discussed with patient. Pt states she can tolerate the contrast and does want the zofran to take before the scan.

## 2022-06-12 ENCOUNTER — Encounter (INDEPENDENT_AMBULATORY_CARE_PROVIDER_SITE_OTHER): Payer: Self-pay

## 2022-06-12 DIAGNOSIS — N186 End stage renal disease: Secondary | ICD-10-CM | POA: Diagnosis not present

## 2022-06-12 DIAGNOSIS — E8779 Other fluid overload: Secondary | ICD-10-CM | POA: Diagnosis not present

## 2022-06-12 DIAGNOSIS — G5603 Carpal tunnel syndrome, bilateral upper limbs: Secondary | ICD-10-CM | POA: Diagnosis not present

## 2022-06-12 DIAGNOSIS — Z992 Dependence on renal dialysis: Secondary | ICD-10-CM | POA: Diagnosis not present

## 2022-06-12 NOTE — Telephone Encounter (Signed)
Per [facility] Medicare-members benefit plan did not require PA for request of CT Abd/Pelvis with contrast  Pt scheduled at [facility] on 06/29/22 at 5 pm. Pt to arrive 15 min early Pt contacted and is aware. Will send appt reminder to pt with address and phone number of facility.

## 2022-06-13 DIAGNOSIS — Z992 Dependence on renal dialysis: Secondary | ICD-10-CM | POA: Diagnosis not present

## 2022-06-13 DIAGNOSIS — E8779 Other fluid overload: Secondary | ICD-10-CM | POA: Diagnosis not present

## 2022-06-13 DIAGNOSIS — N186 End stage renal disease: Secondary | ICD-10-CM | POA: Diagnosis not present

## 2022-06-14 DIAGNOSIS — E8779 Other fluid overload: Secondary | ICD-10-CM | POA: Diagnosis not present

## 2022-06-14 DIAGNOSIS — Z992 Dependence on renal dialysis: Secondary | ICD-10-CM | POA: Diagnosis not present

## 2022-06-14 DIAGNOSIS — N186 End stage renal disease: Secondary | ICD-10-CM | POA: Diagnosis not present

## 2022-06-15 DIAGNOSIS — E8779 Other fluid overload: Secondary | ICD-10-CM | POA: Diagnosis not present

## 2022-06-15 DIAGNOSIS — Z992 Dependence on renal dialysis: Secondary | ICD-10-CM | POA: Diagnosis not present

## 2022-06-15 DIAGNOSIS — N186 End stage renal disease: Secondary | ICD-10-CM | POA: Diagnosis not present

## 2022-06-18 DIAGNOSIS — N186 End stage renal disease: Secondary | ICD-10-CM | POA: Diagnosis not present

## 2022-06-18 DIAGNOSIS — E8779 Other fluid overload: Secondary | ICD-10-CM | POA: Diagnosis not present

## 2022-06-18 DIAGNOSIS — Z992 Dependence on renal dialysis: Secondary | ICD-10-CM | POA: Diagnosis not present

## 2022-06-19 DIAGNOSIS — G5601 Carpal tunnel syndrome, right upper limb: Secondary | ICD-10-CM | POA: Diagnosis not present

## 2022-06-19 DIAGNOSIS — N186 End stage renal disease: Secondary | ICD-10-CM | POA: Diagnosis not present

## 2022-06-19 DIAGNOSIS — E8779 Other fluid overload: Secondary | ICD-10-CM | POA: Diagnosis not present

## 2022-06-19 DIAGNOSIS — Z992 Dependence on renal dialysis: Secondary | ICD-10-CM | POA: Diagnosis not present

## 2022-06-20 DIAGNOSIS — Z992 Dependence on renal dialysis: Secondary | ICD-10-CM | POA: Diagnosis not present

## 2022-06-20 DIAGNOSIS — E8779 Other fluid overload: Secondary | ICD-10-CM | POA: Diagnosis not present

## 2022-06-20 DIAGNOSIS — N186 End stage renal disease: Secondary | ICD-10-CM | POA: Diagnosis not present

## 2022-06-21 DIAGNOSIS — E8779 Other fluid overload: Secondary | ICD-10-CM | POA: Diagnosis not present

## 2022-06-21 DIAGNOSIS — Z992 Dependence on renal dialysis: Secondary | ICD-10-CM | POA: Diagnosis not present

## 2022-06-21 DIAGNOSIS — N186 End stage renal disease: Secondary | ICD-10-CM | POA: Diagnosis not present

## 2022-06-22 DIAGNOSIS — Z992 Dependence on renal dialysis: Secondary | ICD-10-CM | POA: Diagnosis not present

## 2022-06-22 DIAGNOSIS — N186 End stage renal disease: Secondary | ICD-10-CM | POA: Diagnosis not present

## 2022-06-22 DIAGNOSIS — E8779 Other fluid overload: Secondary | ICD-10-CM | POA: Diagnosis not present

## 2022-06-23 ENCOUNTER — Emergency Department (HOSPITAL_COMMUNITY): Payer: Medicare Other

## 2022-06-23 ENCOUNTER — Other Ambulatory Visit: Payer: Self-pay

## 2022-06-23 ENCOUNTER — Encounter (HOSPITAL_COMMUNITY): Payer: Self-pay | Admitting: Emergency Medicine

## 2022-06-23 ENCOUNTER — Emergency Department (HOSPITAL_COMMUNITY)
Admission: EM | Admit: 2022-06-23 | Discharge: 2022-06-23 | Disposition: A | Discharge status: Home / Self Care | Payer: Medicare Other | Attending: Emergency Medicine | Admitting: Emergency Medicine

## 2022-06-23 DIAGNOSIS — R55 Syncope and collapse: Secondary | ICD-10-CM | POA: Diagnosis not present

## 2022-06-23 DIAGNOSIS — Z7982 Long term (current) use of aspirin: Secondary | ICD-10-CM | POA: Insufficient documentation

## 2022-06-23 DIAGNOSIS — M25571 Pain in right ankle and joints of right foot: Secondary | ICD-10-CM | POA: Diagnosis not present

## 2022-06-23 DIAGNOSIS — M25531 Pain in right wrist: Secondary | ICD-10-CM | POA: Diagnosis not present

## 2022-06-23 DIAGNOSIS — S99812A Other specified injuries of left ankle, initial encounter: Secondary | ICD-10-CM | POA: Diagnosis not present

## 2022-06-23 DIAGNOSIS — S99912A Unspecified injury of left ankle, initial encounter: Secondary | ICD-10-CM | POA: Insufficient documentation

## 2022-06-23 DIAGNOSIS — Z043 Encounter for examination and observation following other accident: Secondary | ICD-10-CM | POA: Diagnosis not present

## 2022-06-23 DIAGNOSIS — S8265XA Nondisplaced fracture of lateral malleolus of left fibula, initial encounter for closed fracture: Secondary | ICD-10-CM | POA: Diagnosis not present

## 2022-06-23 DIAGNOSIS — M25511 Pain in right shoulder: Secondary | ICD-10-CM | POA: Diagnosis not present

## 2022-06-23 MED ORDER — ACETAMINOPHEN 325 MG PO TABS
650.0000 mg | ORAL_TABLET | Freq: Once | ORAL | Status: AC
  Administered 2022-06-23: 650 mg via ORAL
  Filled 2022-06-23: qty 2

## 2022-06-23 NOTE — Discharge Instructions (Signed)
Get a cane that has a larger base.  Let your orthopedic doctors know about your ankle injury before they do surgery on your left hand and follow-up with them this week.  Keep your leg elevated

## 2022-06-23 NOTE — ED Triage Notes (Signed)
Pt via POV after a fall sustained while getting out of the car. Pt says she felt lightheaded and then found herself on the ground, unsure of LOC. Pt had surgery on her right wrist earlier this week and has a cast in place. She reports pain to right wrist, left ankle pain and swelling with limited ROM, right ankle pain and abrasion, and right shoulder pain. Pt takes baby aspirin each day and is a dialysis patient with home HD therapy Monday thru Friday.

## 2022-06-23 NOTE — ED Provider Notes (Signed)
[place] EMERGENCY DEPARTMENT AT [facility] Provider Note   CSN: [id number] Arrival date & time: 06/23/22  1653     History {Add pertinent medical, surgical, social history, OB history to HPI:1} Chief Complaint  Patient presents with   Margaret Huerta is a 77 y.o. female.  Patient fell getting out of the car.  She injured her left ankle right ankle right shoulder and right wrist.  She recently had surgery on that wrist   Fall       Home Medications Prior to Admission medications   Medication Sig Start Date End Date Taking? Authorizing Provider  acetaminophen (TYLENOL) 500 MG tablet Take 1,000 mg by mouth every 6 (six) hours as needed (for pain.).    [provider]  allopurinol (ZYLOPRIM) 300 MG tablet Take 300 mg by mouth in the morning. 05/09/17   [provider]  ALPRAZolam Prudy Feeler) 0.25 MG tablet Take 0.25 mg by mouth every Monday, Wednesday, and Friday with hemodialysis. 05/23/21   [provider]  AREXVY 120 MCG/0.5ML injection  01/15/22   [provider]  aspirin EC 81 MG tablet Take 81 mg by mouth in the morning. Swallow whole.    [provider]  atenolol (TENORMIN) 50 MG tablet Take 50 mg by mouth 2 (two) times daily.    [provider]  calcitRIOL (ROCALTROL) 0.25 MCG capsule Take 0.25 mcg by mouth in the morning.    [provider]  Cholecalciferol (VITAMIN D3) 250 MCG (10000 UT) TABS Take 10,000 Units by mouth in the morning.    [provider]  docusate sodium (COLACE) 50 MG capsule Take 50 mg by mouth at bedtime.    [provider]  DULoxetine (CYMBALTA) 60 MG capsule Take 60 mg by mouth in the morning.    [provider]  furosemide (LASIX) 40 MG tablet Take 40 mg by mouth in the morning. 40 mg in the evening Monday- Friday, 40 mg twice daily on Saturday & Sundays.    [provider]  gabapentin (NEURONTIN) 100 MG capsule Take 100 mg by mouth daily.     [provider]  isosorbide dinitrate (ISORDIL) 20 MG tablet Take 1 tablet (20 mg total) by mouth 2 (two) times daily. 06/06/22   Antoine Poche, MD  NITROSTAT 0.4 MG SL tablet Place 1 tablet (0.4 mg total) under the tongue as needed. Patient taking differently: Place 0.4 mg under the tongue every 5 (five) minutes x 3 doses as needed. 07/21/21   Antoine Poche, MD  ondansetron (ZOFRAN-ODT) 4 MG disintegrating tablet Take 1 tablet (4 mg total) by mouth every 8 (eight) hours as needed for nausea or vomiting. 06/11/22   Aida Raider, NP  rosuvastatin (CRESTOR) 10 MG tablet Take 1 tablet (10 mg total) by mouth daily. 12/01/19   Netta Neat., NP  sevelamer carbonate (RENVELA) 800 MG tablet Take 800 mg by mouth 3 (three) times daily. 09/13/21   [provider]  sodium bicarbonate 650 MG tablet Take 650 mg by mouth 2 (two) times daily. 04/15/21   [provider]      Allergies    Demerol [meperidine], Lyrica [pregabalin], Vicodin [hydrocodone-acetaminophen], Ace inhibitors, Aleve [naproxen], Codeine, Contrast media [iodinated contrast media], Fentanyl, Gluten meal, Lactose intolerance (gi), Morphine, Neurontin [gabapentin], Norvasc [amlodipine], Nsaids, Other, and Dilaudid [hydromorphone hcl]    Review of Systems   Review of Systems  Physical Exam Updated Vital Signs BP 98/64 (BP  Location: Left Arm)   Pulse (!) 58   Temp 98.5 F (36.9 C) (Oral)   Resp 18   Ht 5\' 2"  (1.575 m)   Wt 74.8 kg   SpO2 100%   BMI 30.18 kg/m  Physical Exam  ED Results / Procedures / Treatments   Labs (all labs ordered are listed, but only abnormal results are displayed) Labs Reviewed - No data to display  EKG None  Radiology DG Wrist Complete Right  Result Date: 06/23/2022 CLINICAL DATA:  Fall EXAM: RIGHT WRIST - COMPLETE 3+ VIEW COMPARISON:  None Available. FINDINGS: The bones are osteopenic. There is no acute fracture or dislocation. There is soft tissue swelling  surrounding the wrist. There is mild degenerative narrowing of the radiocarpal joint. IMPRESSION: 1. No acute fracture or dislocation. 2. Soft tissue swelling surrounding the wrist. Electronically Signed   By: Darliss Cheney M.D.   On: 06/23/2022 18:22   DG Ankle 2 Views Right  Result Date: 06/23/2022 CLINICAL DATA:  Fall. EXAM: RIGHT ANKLE - 2 VIEW COMPARISON:  Right tibia and fibula radiograph 03/25/2018 FINDINGS: There is no evidence of fracture, dislocation, or joint effusion. There is no evidence of significant arthropathy. Small plantar calcaneal enthesophyte. Vascular calcifications are noted in the visualized distal right lower leg. IMPRESSION: No acute fracture or dislocation of the right ankle. Electronically Signed   By: Sherron Ales M.D.   On: 06/23/2022 18:15   DG Ankle 2 Views Left  Result Date: 06/23/2022 CLINICAL DATA:  Fall to ground. EXAM: LEFT ANKLE - 2 VIEW COMPARISON:  None Available. FINDINGS: Subtle cortical disruption along the lateral margin of the lateral malleolus. Findings concerning for nondisplaced fracture of the lateral malleolus. Soft tissue swelling over the lateral malleolus. Ankle mortise intact.  Talar dome is normal. IMPRESSION: Nondisplaced LEFT lateral malleolus fracture Electronically Signed   By: Genevive Bi M.D.   On: 06/23/2022 18:13   DG Shoulder Right  Result Date: 06/23/2022 CLINICAL DATA:  Pain after fall EXAM: RIGHT SHOULDER - 2+ VIEW COMPARISON:  None Available. FINDINGS: Mild soft tissue calcification adjacent to the humeral head laterally. No fractures identified. Mild glenohumeral degenerative change. Limited transscapular Y-view due to positioning. No evidence of dislocation. IMPRESSION: 1. Mild soft tissue calcification adjacent to the humeral head laterally consistent with calcific tendinopathy. 2. Mild glenohumeral degenerative change. 3. No fracture or dislocation. Electronically Signed   By: Gerome Sam III M.D.   On: 06/23/2022 18:13    DG Chest Port 1 View  Result Date: 06/23/2022 CLINICAL DATA:  Syncope EXAM: PORTABLE CHEST 1 VIEW COMPARISON:  01/12/2022 FINDINGS: Large-bore central venous line unchanged. Normal cardiac silhouette. No effusion, infiltrate, or pneumothorax. No acute osseous abnormality. IMPRESSION: No acute cardiopulmonary process. Electronically Signed   By: Genevive Bi M.D.   On: 06/23/2022 18:11    Procedures Procedures  {Document cardiac monitor, telemetry assessment procedure when appropriate:1}  Medications Ordered in ED Medications  acetaminophen (TYLENOL) tablet 650 mg (650 mg Oral Given 06/23/22 1830)    ED Course/ Medical Decision Making/ A&P   {   Click here for ABCD2, HEART and other calculatorsREFRESH Note before signing :1}                          Medical Decision Making Amount and/or Complexity of Data Reviewed Radiology: ordered.  Risk OTC drugs.   Patient with a fracture of the left medial malleolus that is nondisplaced.  She is given an ASO  and will follow-up with Ortho  {Document critical care time when appropriate:1} {Document review of labs and clinical decision tools ie heart score, Chads2Vasc2 etc:1}  {Document your independent review of radiology images, and any outside records:1} {Document your discussion with family members, caretakers, and with consultants:1} {Document social determinants of health affecting pt's care:1} {Document your decision making why or why not admission, treatments were needed:1} Final Clinical Impression(s) / ED Diagnoses Final diagnoses:  Injury of left ankle, initial encounter    Rx / DC Orders ED Discharge Orders     None

## 2022-06-25 DIAGNOSIS — N186 End stage renal disease: Secondary | ICD-10-CM | POA: Diagnosis not present

## 2022-06-25 DIAGNOSIS — E8779 Other fluid overload: Secondary | ICD-10-CM | POA: Diagnosis not present

## 2022-06-25 DIAGNOSIS — Z992 Dependence on renal dialysis: Secondary | ICD-10-CM | POA: Diagnosis not present

## 2022-06-25 DIAGNOSIS — E1122 Type 2 diabetes mellitus with diabetic chronic kidney disease: Secondary | ICD-10-CM | POA: Diagnosis not present

## 2022-06-26 DIAGNOSIS — N186 End stage renal disease: Secondary | ICD-10-CM | POA: Diagnosis not present

## 2022-06-26 DIAGNOSIS — E8779 Other fluid overload: Secondary | ICD-10-CM | POA: Diagnosis not present

## 2022-06-26 DIAGNOSIS — Z992 Dependence on renal dialysis: Secondary | ICD-10-CM | POA: Diagnosis not present

## 2022-06-27 DIAGNOSIS — N186 End stage renal disease: Secondary | ICD-10-CM | POA: Diagnosis not present

## 2022-06-27 DIAGNOSIS — E8779 Other fluid overload: Secondary | ICD-10-CM | POA: Diagnosis not present

## 2022-06-27 DIAGNOSIS — Z992 Dependence on renal dialysis: Secondary | ICD-10-CM | POA: Diagnosis not present

## 2022-06-28 DIAGNOSIS — S8262XA Displaced fracture of lateral malleolus of left fibula, initial encounter for closed fracture: Secondary | ICD-10-CM | POA: Diagnosis not present

## 2022-06-28 DIAGNOSIS — N186 End stage renal disease: Secondary | ICD-10-CM | POA: Diagnosis not present

## 2022-06-28 DIAGNOSIS — E8779 Other fluid overload: Secondary | ICD-10-CM | POA: Diagnosis not present

## 2022-06-28 DIAGNOSIS — Z992 Dependence on renal dialysis: Secondary | ICD-10-CM | POA: Diagnosis not present

## 2022-06-29 ENCOUNTER — Telehealth: Payer: Self-pay | Admitting: Cardiology

## 2022-06-29 ENCOUNTER — Ambulatory Visit (HOSPITAL_BASED_OUTPATIENT_CLINIC_OR_DEPARTMENT_OTHER)
Admission: RE | Admit: 2022-06-29 | Discharge: 2022-06-29 | Disposition: A | Discharge status: Home / Self Care | Payer: Medicare Other | Source: Ambulatory Visit | Attending: Gastroenterology | Admitting: Gastroenterology

## 2022-06-29 DIAGNOSIS — Z992 Dependence on renal dialysis: Secondary | ICD-10-CM | POA: Diagnosis not present

## 2022-06-29 DIAGNOSIS — R1032 Left lower quadrant pain: Secondary | ICD-10-CM

## 2022-06-29 DIAGNOSIS — N186 End stage renal disease: Secondary | ICD-10-CM | POA: Diagnosis not present

## 2022-06-29 DIAGNOSIS — G5601 Carpal tunnel syndrome, right upper limb: Secondary | ICD-10-CM | POA: Diagnosis not present

## 2022-06-29 DIAGNOSIS — E8779 Other fluid overload: Secondary | ICD-10-CM | POA: Diagnosis not present

## 2022-06-29 MED ORDER — ATENOLOL 50 MG PO TABS: 25.0000 mg | ORAL_TABLET | Freq: Two times a day (BID) | ORAL | 1 refills | Status: AC

## 2022-06-29 MED ORDER — ISOSORBIDE DINITRATE 20 MG PO TABS: 10.0000 mg | ORAL_TABLET | Freq: Two times a day (BID) | ORAL | 2 refills | Status: DC

## 2022-06-29 MED ORDER — IOHEXOL 300 MG/ML  SOLN
80.0000 mL | Freq: Once | INTRAMUSCULAR | Status: AC | PRN
  Administered 2022-06-29: 80 mL via INTRAVENOUS

## 2022-06-29 NOTE — Telephone Encounter (Signed)
Patient informed and verbalized understanding of plan. 

## 2022-06-29 NOTE — Telephone Encounter (Signed)
Please give pt a call- she's been having problems with her BP running low. She has tracked it when she's doing dialysis.   She's also fallen and broken her foot - she became dizzy and passed out when she was getting out of the car.   Please call pt on her cell phone [phone]

## 2022-06-29 NOTE — Telephone Encounter (Signed)
Would also lower atenolol to 25mg  bid   Dominga Ferry MD

## 2022-06-29 NOTE — Telephone Encounter (Signed)
Reports blood pressures are still low despite recent medication changes Reports standing BP's are around 80/49 & 90/52 and sitting BP's are around  102/58. Reports BP's have been this low since starting dialysis. Reports HR ranging around 50-66 Reports she checks her blood pressures at home daily but has not checked blood pressure today Reports falling 5 times in the past month with last fall resulting in a left ankle fracture Medications reviewed.  Advised to break isordil in 1/2 twice daily, continue monitoring BP's and update [place] in 2 weeks with readings.  Verbalized understanding of plan.

## 2022-07-02 DIAGNOSIS — Z992 Dependence on renal dialysis: Secondary | ICD-10-CM | POA: Diagnosis not present

## 2022-07-02 DIAGNOSIS — N186 End stage renal disease: Secondary | ICD-10-CM | POA: Diagnosis not present

## 2022-07-02 DIAGNOSIS — E8779 Other fluid overload: Secondary | ICD-10-CM | POA: Diagnosis not present

## 2022-07-03 DIAGNOSIS — E8779 Other fluid overload: Secondary | ICD-10-CM | POA: Diagnosis not present

## 2022-07-03 DIAGNOSIS — N186 End stage renal disease: Secondary | ICD-10-CM | POA: Diagnosis not present

## 2022-07-03 DIAGNOSIS — Z992 Dependence on renal dialysis: Secondary | ICD-10-CM | POA: Diagnosis not present

## 2022-07-04 DIAGNOSIS — N186 End stage renal disease: Secondary | ICD-10-CM | POA: Diagnosis not present

## 2022-07-04 DIAGNOSIS — E8779 Other fluid overload: Secondary | ICD-10-CM | POA: Diagnosis not present

## 2022-07-04 DIAGNOSIS — Z992 Dependence on renal dialysis: Secondary | ICD-10-CM | POA: Diagnosis not present

## 2022-07-05 DIAGNOSIS — E8779 Other fluid overload: Secondary | ICD-10-CM | POA: Diagnosis not present

## 2022-07-05 DIAGNOSIS — Z992 Dependence on renal dialysis: Secondary | ICD-10-CM | POA: Diagnosis not present

## 2022-07-05 DIAGNOSIS — N186 End stage renal disease: Secondary | ICD-10-CM | POA: Diagnosis not present

## 2022-07-06 DIAGNOSIS — Z992 Dependence on renal dialysis: Secondary | ICD-10-CM | POA: Diagnosis not present

## 2022-07-06 DIAGNOSIS — N186 End stage renal disease: Secondary | ICD-10-CM | POA: Diagnosis not present

## 2022-07-06 DIAGNOSIS — E8779 Other fluid overload: Secondary | ICD-10-CM | POA: Diagnosis not present

## 2022-07-09 DIAGNOSIS — Z992 Dependence on renal dialysis: Secondary | ICD-10-CM | POA: Diagnosis not present

## 2022-07-09 DIAGNOSIS — E8779 Other fluid overload: Secondary | ICD-10-CM | POA: Diagnosis not present

## 2022-07-09 DIAGNOSIS — N186 End stage renal disease: Secondary | ICD-10-CM | POA: Diagnosis not present

## 2022-07-10 DIAGNOSIS — N186 End stage renal disease: Secondary | ICD-10-CM | POA: Diagnosis not present

## 2022-07-10 DIAGNOSIS — E8779 Other fluid overload: Secondary | ICD-10-CM | POA: Diagnosis not present

## 2022-07-10 DIAGNOSIS — Z992 Dependence on renal dialysis: Secondary | ICD-10-CM | POA: Diagnosis not present

## 2022-07-11 DIAGNOSIS — N186 End stage renal disease: Secondary | ICD-10-CM | POA: Diagnosis not present

## 2022-07-11 DIAGNOSIS — Z992 Dependence on renal dialysis: Secondary | ICD-10-CM | POA: Diagnosis not present

## 2022-07-11 DIAGNOSIS — E8779 Other fluid overload: Secondary | ICD-10-CM | POA: Diagnosis not present

## 2022-07-12 DIAGNOSIS — Z992 Dependence on renal dialysis: Secondary | ICD-10-CM | POA: Diagnosis not present

## 2022-07-12 DIAGNOSIS — S8262XA Displaced fracture of lateral malleolus of left fibula, initial encounter for closed fracture: Secondary | ICD-10-CM | POA: Diagnosis not present

## 2022-07-12 DIAGNOSIS — E8779 Other fluid overload: Secondary | ICD-10-CM | POA: Diagnosis not present

## 2022-07-12 DIAGNOSIS — N186 End stage renal disease: Secondary | ICD-10-CM | POA: Diagnosis not present

## 2022-07-13 DIAGNOSIS — E8779 Other fluid overload: Secondary | ICD-10-CM | POA: Diagnosis not present

## 2022-07-13 DIAGNOSIS — E785 Hyperlipidemia, unspecified: Secondary | ICD-10-CM | POA: Diagnosis not present

## 2022-07-13 DIAGNOSIS — N186 End stage renal disease: Secondary | ICD-10-CM | POA: Diagnosis not present

## 2022-07-13 DIAGNOSIS — E119 Type 2 diabetes mellitus without complications: Secondary | ICD-10-CM | POA: Diagnosis not present

## 2022-07-13 DIAGNOSIS — K219 Gastro-esophageal reflux disease without esophagitis: Secondary | ICD-10-CM | POA: Diagnosis not present

## 2022-07-13 DIAGNOSIS — I10 Essential (primary) hypertension: Secondary | ICD-10-CM | POA: Diagnosis not present

## 2022-07-13 DIAGNOSIS — Z992 Dependence on renal dialysis: Secondary | ICD-10-CM | POA: Diagnosis not present

## 2022-07-16 DIAGNOSIS — N186 End stage renal disease: Secondary | ICD-10-CM | POA: Diagnosis not present

## 2022-07-16 DIAGNOSIS — E8779 Other fluid overload: Secondary | ICD-10-CM | POA: Diagnosis not present

## 2022-07-16 DIAGNOSIS — Z992 Dependence on renal dialysis: Secondary | ICD-10-CM | POA: Diagnosis not present

## 2022-07-17 ENCOUNTER — Telehealth (INDEPENDENT_AMBULATORY_CARE_PROVIDER_SITE_OTHER): Payer: Self-pay | Admitting: *Deleted

## 2022-07-17 DIAGNOSIS — E8779 Other fluid overload: Secondary | ICD-10-CM | POA: Diagnosis not present

## 2022-07-17 DIAGNOSIS — Z992 Dependence on renal dialysis: Secondary | ICD-10-CM | POA: Diagnosis not present

## 2022-07-17 DIAGNOSIS — N186 End stage renal disease: Secondary | ICD-10-CM | POA: Diagnosis not present

## 2022-07-17 NOTE — Telephone Encounter (Signed)
Patient seen 06/05/22. States pain on left upper side is worse. Its a sharp pain that comes and goes. Rolls to left side and then stops. Last 1 -2 mins and comes around about every 15 - 20 minutes. Wakes her from sleep at times. Area on left side has been a little bit swollen for about 1 and a half weeks. Pressing on the swollen area eases the pain. Pain is worse after eating and drinking. Has had BM's that look like orange juice since starting the miralax after the visit on 4/23. Takes one capful every morning.   [phone]

## 2022-07-18 ENCOUNTER — Other Ambulatory Visit (INDEPENDENT_AMBULATORY_CARE_PROVIDER_SITE_OTHER): Payer: Self-pay | Admitting: *Deleted

## 2022-07-18 DIAGNOSIS — Z992 Dependence on renal dialysis: Secondary | ICD-10-CM | POA: Diagnosis not present

## 2022-07-18 DIAGNOSIS — N186 End stage renal disease: Secondary | ICD-10-CM | POA: Diagnosis not present

## 2022-07-18 DIAGNOSIS — E8779 Other fluid overload: Secondary | ICD-10-CM | POA: Diagnosis not present

## 2022-07-18 MED ORDER — PANTOPRAZOLE SODIUM 40 MG PO TBEC: DELAYED_RELEASE_TABLET | ORAL | 0 refills | Status: DC

## 2022-07-18 NOTE — Telephone Encounter (Signed)
Discussed with patient per courtney - This could be secondary to higher acid content in the GI tract. Lets start pantoprazole 40 mg once daily, 30 minutes prior to breakfast. I will send to her pharmacy on file.  Nothing to explain pain or swelling on Abdominal xray or CT scan. If she does not have improvement with pantoprazole then we may need to do an upper endoscopy to evaluate the stomach given her left upper quadrant pain.  Diet recommendations: Avoid fried, fatty, greasy, spicy, citrus foods. Avoid caffeine and carbonated beverages. Avoid chocolate. Try eating 4-6 small meals a day rather than 3 large meals. Do not eat within 3 hours of laying down. Prop head of bed up on wood or bricks to create a 6 inch incline.  If she is having some looser stools with the miralax she can use every other day rather than daily.   Pt verbalized understanding of all.

## 2022-07-19 DIAGNOSIS — E8779 Other fluid overload: Secondary | ICD-10-CM | POA: Diagnosis not present

## 2022-07-19 DIAGNOSIS — Z992 Dependence on renal dialysis: Secondary | ICD-10-CM | POA: Diagnosis not present

## 2022-07-19 DIAGNOSIS — N186 End stage renal disease: Secondary | ICD-10-CM | POA: Diagnosis not present

## 2022-07-20 DIAGNOSIS — E8779 Other fluid overload: Secondary | ICD-10-CM | POA: Diagnosis not present

## 2022-07-20 DIAGNOSIS — N186 End stage renal disease: Secondary | ICD-10-CM | POA: Diagnosis not present

## 2022-07-20 DIAGNOSIS — Z992 Dependence on renal dialysis: Secondary | ICD-10-CM | POA: Diagnosis not present

## 2022-07-23 DIAGNOSIS — N186 End stage renal disease: Secondary | ICD-10-CM | POA: Diagnosis not present

## 2022-07-23 DIAGNOSIS — E8779 Other fluid overload: Secondary | ICD-10-CM | POA: Diagnosis not present

## 2022-07-23 DIAGNOSIS — Z992 Dependence on renal dialysis: Secondary | ICD-10-CM | POA: Diagnosis not present

## 2022-07-24 ENCOUNTER — Other Ambulatory Visit: Payer: Self-pay | Admitting: Cardiology

## 2022-07-24 DIAGNOSIS — N186 End stage renal disease: Secondary | ICD-10-CM | POA: Diagnosis not present

## 2022-07-24 DIAGNOSIS — E8779 Other fluid overload: Secondary | ICD-10-CM | POA: Diagnosis not present

## 2022-07-24 DIAGNOSIS — Z992 Dependence on renal dialysis: Secondary | ICD-10-CM | POA: Diagnosis not present

## 2022-07-25 DIAGNOSIS — N186 End stage renal disease: Secondary | ICD-10-CM | POA: Diagnosis not present

## 2022-07-25 DIAGNOSIS — Z992 Dependence on renal dialysis: Secondary | ICD-10-CM | POA: Diagnosis not present

## 2022-07-25 DIAGNOSIS — E8779 Other fluid overload: Secondary | ICD-10-CM | POA: Diagnosis not present

## 2022-07-26 DIAGNOSIS — E8779 Other fluid overload: Secondary | ICD-10-CM | POA: Diagnosis not present

## 2022-07-26 DIAGNOSIS — N186 End stage renal disease: Secondary | ICD-10-CM | POA: Diagnosis not present

## 2022-07-26 DIAGNOSIS — Z992 Dependence on renal dialysis: Secondary | ICD-10-CM | POA: Diagnosis not present

## 2022-07-27 ENCOUNTER — Other Ambulatory Visit: Payer: Self-pay | Admitting: Cardiology

## 2022-07-27 DIAGNOSIS — Z992 Dependence on renal dialysis: Secondary | ICD-10-CM | POA: Diagnosis not present

## 2022-07-27 DIAGNOSIS — N186 End stage renal disease: Secondary | ICD-10-CM | POA: Diagnosis not present

## 2022-07-27 DIAGNOSIS — E8779 Other fluid overload: Secondary | ICD-10-CM | POA: Diagnosis not present

## 2022-07-30 DIAGNOSIS — N186 End stage renal disease: Secondary | ICD-10-CM | POA: Diagnosis not present

## 2022-07-30 DIAGNOSIS — E8779 Other fluid overload: Secondary | ICD-10-CM | POA: Diagnosis not present

## 2022-07-30 DIAGNOSIS — Z992 Dependence on renal dialysis: Secondary | ICD-10-CM | POA: Diagnosis not present

## 2022-07-31 DIAGNOSIS — E8779 Other fluid overload: Secondary | ICD-10-CM | POA: Diagnosis not present

## 2022-07-31 DIAGNOSIS — N186 End stage renal disease: Secondary | ICD-10-CM | POA: Diagnosis not present

## 2022-07-31 DIAGNOSIS — Z992 Dependence on renal dialysis: Secondary | ICD-10-CM | POA: Diagnosis not present

## 2022-08-01 DIAGNOSIS — Z992 Dependence on renal dialysis: Secondary | ICD-10-CM | POA: Diagnosis not present

## 2022-08-01 DIAGNOSIS — N186 End stage renal disease: Secondary | ICD-10-CM | POA: Diagnosis not present

## 2022-08-01 DIAGNOSIS — E8779 Other fluid overload: Secondary | ICD-10-CM | POA: Diagnosis not present

## 2022-08-02 DIAGNOSIS — E8779 Other fluid overload: Secondary | ICD-10-CM | POA: Diagnosis not present

## 2022-08-02 DIAGNOSIS — Z992 Dependence on renal dialysis: Secondary | ICD-10-CM | POA: Diagnosis not present

## 2022-08-02 DIAGNOSIS — N186 End stage renal disease: Secondary | ICD-10-CM | POA: Diagnosis not present

## 2022-08-03 DIAGNOSIS — Z992 Dependence on renal dialysis: Secondary | ICD-10-CM | POA: Diagnosis not present

## 2022-08-03 DIAGNOSIS — N186 End stage renal disease: Secondary | ICD-10-CM | POA: Diagnosis not present

## 2022-08-03 DIAGNOSIS — E8779 Other fluid overload: Secondary | ICD-10-CM | POA: Diagnosis not present

## 2022-08-06 DIAGNOSIS — Z992 Dependence on renal dialysis: Secondary | ICD-10-CM | POA: Diagnosis not present

## 2022-08-06 DIAGNOSIS — N186 End stage renal disease: Secondary | ICD-10-CM | POA: Diagnosis not present

## 2022-08-06 DIAGNOSIS — E8779 Other fluid overload: Secondary | ICD-10-CM | POA: Diagnosis not present

## 2022-08-07 DIAGNOSIS — E8779 Other fluid overload: Secondary | ICD-10-CM | POA: Diagnosis not present

## 2022-08-07 DIAGNOSIS — Z992 Dependence on renal dialysis: Secondary | ICD-10-CM | POA: Diagnosis not present

## 2022-08-07 DIAGNOSIS — N186 End stage renal disease: Secondary | ICD-10-CM | POA: Diagnosis not present

## 2022-08-08 DIAGNOSIS — N186 End stage renal disease: Secondary | ICD-10-CM | POA: Diagnosis not present

## 2022-08-08 DIAGNOSIS — E8779 Other fluid overload: Secondary | ICD-10-CM | POA: Diagnosis not present

## 2022-08-08 DIAGNOSIS — Z992 Dependence on renal dialysis: Secondary | ICD-10-CM | POA: Diagnosis not present

## 2022-08-09 DIAGNOSIS — Z992 Dependence on renal dialysis: Secondary | ICD-10-CM | POA: Diagnosis not present

## 2022-08-09 DIAGNOSIS — N186 End stage renal disease: Secondary | ICD-10-CM | POA: Diagnosis not present

## 2022-08-09 DIAGNOSIS — E8779 Other fluid overload: Secondary | ICD-10-CM | POA: Diagnosis not present

## 2022-08-09 DIAGNOSIS — G5603 Carpal tunnel syndrome, bilateral upper limbs: Secondary | ICD-10-CM | POA: Diagnosis not present

## 2022-08-10 DIAGNOSIS — Z992 Dependence on renal dialysis: Secondary | ICD-10-CM | POA: Diagnosis not present

## 2022-08-10 DIAGNOSIS — E8779 Other fluid overload: Secondary | ICD-10-CM | POA: Diagnosis not present

## 2022-08-10 DIAGNOSIS — N186 End stage renal disease: Secondary | ICD-10-CM | POA: Diagnosis not present

## 2022-08-11 DIAGNOSIS — Z992 Dependence on renal dialysis: Secondary | ICD-10-CM | POA: Diagnosis not present

## 2022-08-11 DIAGNOSIS — N186 End stage renal disease: Secondary | ICD-10-CM | POA: Diagnosis not present

## 2022-08-11 DIAGNOSIS — E8779 Other fluid overload: Secondary | ICD-10-CM | POA: Diagnosis not present

## 2022-08-12 DIAGNOSIS — N186 End stage renal disease: Secondary | ICD-10-CM | POA: Diagnosis not present

## 2022-08-12 DIAGNOSIS — Z992 Dependence on renal dialysis: Secondary | ICD-10-CM | POA: Diagnosis not present

## 2022-08-13 DIAGNOSIS — N186 End stage renal disease: Secondary | ICD-10-CM | POA: Diagnosis not present

## 2022-08-13 DIAGNOSIS — S8262XA Displaced fracture of lateral malleolus of left fibula, initial encounter for closed fracture: Secondary | ICD-10-CM | POA: Diagnosis not present

## 2022-08-13 DIAGNOSIS — Z992 Dependence on renal dialysis: Secondary | ICD-10-CM | POA: Diagnosis not present

## 2022-08-13 DIAGNOSIS — E8779 Other fluid overload: Secondary | ICD-10-CM | POA: Diagnosis not present

## 2022-08-14 DIAGNOSIS — N186 End stage renal disease: Secondary | ICD-10-CM | POA: Diagnosis not present

## 2022-08-14 DIAGNOSIS — E8779 Other fluid overload: Secondary | ICD-10-CM | POA: Diagnosis not present

## 2022-08-14 DIAGNOSIS — Z992 Dependence on renal dialysis: Secondary | ICD-10-CM | POA: Diagnosis not present

## 2022-08-15 DIAGNOSIS — E8779 Other fluid overload: Secondary | ICD-10-CM | POA: Diagnosis not present

## 2022-08-15 DIAGNOSIS — Z992 Dependence on renal dialysis: Secondary | ICD-10-CM | POA: Diagnosis not present

## 2022-08-15 DIAGNOSIS — N186 End stage renal disease: Secondary | ICD-10-CM | POA: Diagnosis not present

## 2022-08-16 DIAGNOSIS — Z992 Dependence on renal dialysis: Secondary | ICD-10-CM | POA: Diagnosis not present

## 2022-08-16 DIAGNOSIS — E8779 Other fluid overload: Secondary | ICD-10-CM | POA: Diagnosis not present

## 2022-08-16 DIAGNOSIS — N186 End stage renal disease: Secondary | ICD-10-CM | POA: Diagnosis not present

## 2022-08-18 DIAGNOSIS — Z992 Dependence on renal dialysis: Secondary | ICD-10-CM | POA: Diagnosis not present

## 2022-08-18 DIAGNOSIS — N186 End stage renal disease: Secondary | ICD-10-CM | POA: Diagnosis not present

## 2022-08-18 DIAGNOSIS — E8779 Other fluid overload: Secondary | ICD-10-CM | POA: Diagnosis not present

## 2022-08-20 DIAGNOSIS — N186 End stage renal disease: Secondary | ICD-10-CM | POA: Diagnosis not present

## 2022-08-20 DIAGNOSIS — Z992 Dependence on renal dialysis: Secondary | ICD-10-CM | POA: Diagnosis not present

## 2022-08-20 DIAGNOSIS — E8779 Other fluid overload: Secondary | ICD-10-CM | POA: Diagnosis not present

## 2022-08-21 ENCOUNTER — Other Ambulatory Visit (INDEPENDENT_AMBULATORY_CARE_PROVIDER_SITE_OTHER): Payer: Self-pay | Admitting: *Deleted

## 2022-08-21 ENCOUNTER — Telehealth (INDEPENDENT_AMBULATORY_CARE_PROVIDER_SITE_OTHER): Payer: Self-pay | Admitting: *Deleted

## 2022-08-21 DIAGNOSIS — K529 Noninfective gastroenteritis and colitis, unspecified: Secondary | ICD-10-CM

## 2022-08-21 DIAGNOSIS — N186 End stage renal disease: Secondary | ICD-10-CM | POA: Diagnosis not present

## 2022-08-21 DIAGNOSIS — K5900 Constipation, unspecified: Secondary | ICD-10-CM

## 2022-08-21 DIAGNOSIS — Z992 Dependence on renal dialysis: Secondary | ICD-10-CM | POA: Diagnosis not present

## 2022-08-21 DIAGNOSIS — E8779 Other fluid overload: Secondary | ICD-10-CM | POA: Diagnosis not present

## 2022-08-21 NOTE — Telephone Encounter (Signed)
Pt left vm that she stopped protonix after one month because med did not help one bit. She is still having the same pain. She also quit miralax because she was tired of water stools. Does she need ov to recheck? Last seen 06/05/22.   [phone]

## 2022-08-21 NOTE — Telephone Encounter (Signed)
Thanks, I'll forward the results to you once I receive them

## 2022-08-21 NOTE — Telephone Encounter (Signed)
Hi, Courtney, I discussed with patient she is having diarrhea. She declines to try low dose levsin or dicyclomine at this time. She will go to lab on Thursday to do tsh. I put in lab order and she will come by office to pick up sucrase test kit and get instructions for test. ( Did put order for sucrase in Dr. Wilburt Finlay name since forms we have have his signature )

## 2022-08-22 DIAGNOSIS — E8779 Other fluid overload: Secondary | ICD-10-CM | POA: Diagnosis not present

## 2022-08-22 DIAGNOSIS — N186 End stage renal disease: Secondary | ICD-10-CM | POA: Diagnosis not present

## 2022-08-22 DIAGNOSIS — Z992 Dependence on renal dialysis: Secondary | ICD-10-CM | POA: Diagnosis not present

## 2022-08-23 DIAGNOSIS — E1129 Type 2 diabetes mellitus with other diabetic kidney complication: Secondary | ICD-10-CM | POA: Diagnosis not present

## 2022-08-23 DIAGNOSIS — K529 Noninfective gastroenteritis and colitis, unspecified: Secondary | ICD-10-CM | POA: Diagnosis not present

## 2022-08-23 DIAGNOSIS — Z79899 Other long term (current) drug therapy: Secondary | ICD-10-CM | POA: Diagnosis not present

## 2022-08-23 DIAGNOSIS — N186 End stage renal disease: Secondary | ICD-10-CM | POA: Diagnosis not present

## 2022-08-23 DIAGNOSIS — G5602 Carpal tunnel syndrome, left upper limb: Secondary | ICD-10-CM | POA: Diagnosis not present

## 2022-08-23 DIAGNOSIS — I10 Essential (primary) hypertension: Secondary | ICD-10-CM | POA: Diagnosis not present

## 2022-08-24 DIAGNOSIS — E8779 Other fluid overload: Secondary | ICD-10-CM | POA: Diagnosis not present

## 2022-08-24 DIAGNOSIS — Z992 Dependence on renal dialysis: Secondary | ICD-10-CM | POA: Diagnosis not present

## 2022-08-24 DIAGNOSIS — N186 End stage renal disease: Secondary | ICD-10-CM | POA: Diagnosis not present

## 2022-08-24 LAB — TSH: TSH: 0.972 u[IU]/mL (ref 0.450–4.500)

## 2022-08-25 DIAGNOSIS — E8779 Other fluid overload: Secondary | ICD-10-CM | POA: Diagnosis not present

## 2022-08-25 DIAGNOSIS — N186 End stage renal disease: Secondary | ICD-10-CM | POA: Diagnosis not present

## 2022-08-25 DIAGNOSIS — Z992 Dependence on renal dialysis: Secondary | ICD-10-CM | POA: Diagnosis not present

## 2022-08-27 DIAGNOSIS — E8779 Other fluid overload: Secondary | ICD-10-CM | POA: Diagnosis not present

## 2022-08-27 DIAGNOSIS — N186 End stage renal disease: Secondary | ICD-10-CM | POA: Diagnosis not present

## 2022-08-27 DIAGNOSIS — Z992 Dependence on renal dialysis: Secondary | ICD-10-CM | POA: Diagnosis not present

## 2022-08-28 DIAGNOSIS — N186 End stage renal disease: Secondary | ICD-10-CM | POA: Diagnosis not present

## 2022-08-28 DIAGNOSIS — E8779 Other fluid overload: Secondary | ICD-10-CM | POA: Diagnosis not present

## 2022-08-28 DIAGNOSIS — Z992 Dependence on renal dialysis: Secondary | ICD-10-CM | POA: Diagnosis not present

## 2022-08-29 DIAGNOSIS — E8779 Other fluid overload: Secondary | ICD-10-CM | POA: Diagnosis not present

## 2022-08-29 DIAGNOSIS — N186 End stage renal disease: Secondary | ICD-10-CM | POA: Diagnosis not present

## 2022-08-29 DIAGNOSIS — Z992 Dependence on renal dialysis: Secondary | ICD-10-CM | POA: Diagnosis not present

## 2022-08-30 DIAGNOSIS — E8779 Other fluid overload: Secondary | ICD-10-CM | POA: Diagnosis not present

## 2022-08-30 DIAGNOSIS — N186 End stage renal disease: Secondary | ICD-10-CM | POA: Diagnosis not present

## 2022-08-30 DIAGNOSIS — Z992 Dependence on renal dialysis: Secondary | ICD-10-CM | POA: Diagnosis not present

## 2022-08-31 DIAGNOSIS — E8779 Other fluid overload: Secondary | ICD-10-CM | POA: Diagnosis not present

## 2022-08-31 DIAGNOSIS — E785 Hyperlipidemia, unspecified: Secondary | ICD-10-CM | POA: Diagnosis not present

## 2022-08-31 DIAGNOSIS — E1122 Type 2 diabetes mellitus with diabetic chronic kidney disease: Secondary | ICD-10-CM | POA: Diagnosis not present

## 2022-08-31 DIAGNOSIS — I70209 Unspecified atherosclerosis of native arteries of extremities, unspecified extremity: Secondary | ICD-10-CM | POA: Diagnosis not present

## 2022-08-31 DIAGNOSIS — R7309 Other abnormal glucose: Secondary | ICD-10-CM | POA: Diagnosis not present

## 2022-08-31 DIAGNOSIS — N186 End stage renal disease: Secondary | ICD-10-CM | POA: Diagnosis not present

## 2022-08-31 DIAGNOSIS — R7401 Elevation of levels of liver transaminase levels: Secondary | ICD-10-CM | POA: Diagnosis not present

## 2022-08-31 DIAGNOSIS — Z992 Dependence on renal dialysis: Secondary | ICD-10-CM | POA: Diagnosis not present

## 2022-09-03 ENCOUNTER — Other Ambulatory Visit (INDEPENDENT_AMBULATORY_CARE_PROVIDER_SITE_OTHER): Payer: Self-pay | Admitting: *Deleted

## 2022-09-03 DIAGNOSIS — Z992 Dependence on renal dialysis: Secondary | ICD-10-CM | POA: Diagnosis not present

## 2022-09-03 DIAGNOSIS — E8779 Other fluid overload: Secondary | ICD-10-CM | POA: Diagnosis not present

## 2022-09-03 DIAGNOSIS — N186 End stage renal disease: Secondary | ICD-10-CM | POA: Diagnosis not present

## 2022-09-04 DIAGNOSIS — N186 End stage renal disease: Secondary | ICD-10-CM | POA: Diagnosis not present

## 2022-09-04 DIAGNOSIS — E8779 Other fluid overload: Secondary | ICD-10-CM | POA: Diagnosis not present

## 2022-09-04 DIAGNOSIS — Z992 Dependence on renal dialysis: Secondary | ICD-10-CM | POA: Diagnosis not present

## 2022-09-05 DIAGNOSIS — N186 End stage renal disease: Secondary | ICD-10-CM | POA: Diagnosis not present

## 2022-09-05 DIAGNOSIS — E8779 Other fluid overload: Secondary | ICD-10-CM | POA: Diagnosis not present

## 2022-09-05 DIAGNOSIS — Z992 Dependence on renal dialysis: Secondary | ICD-10-CM | POA: Diagnosis not present

## 2022-09-06 DIAGNOSIS — Z992 Dependence on renal dialysis: Secondary | ICD-10-CM | POA: Diagnosis not present

## 2022-09-06 DIAGNOSIS — E8779 Other fluid overload: Secondary | ICD-10-CM | POA: Diagnosis not present

## 2022-09-06 DIAGNOSIS — N186 End stage renal disease: Secondary | ICD-10-CM | POA: Diagnosis not present

## 2022-09-07 DIAGNOSIS — N186 End stage renal disease: Secondary | ICD-10-CM | POA: Diagnosis not present

## 2022-09-07 DIAGNOSIS — Z992 Dependence on renal dialysis: Secondary | ICD-10-CM | POA: Diagnosis not present

## 2022-09-07 DIAGNOSIS — E8779 Other fluid overload: Secondary | ICD-10-CM | POA: Diagnosis not present

## 2022-09-10 DIAGNOSIS — E8779 Other fluid overload: Secondary | ICD-10-CM | POA: Diagnosis not present

## 2022-09-10 DIAGNOSIS — Z992 Dependence on renal dialysis: Secondary | ICD-10-CM | POA: Diagnosis not present

## 2022-09-10 DIAGNOSIS — N186 End stage renal disease: Secondary | ICD-10-CM | POA: Diagnosis not present

## 2022-09-11 ENCOUNTER — Encounter (INDEPENDENT_AMBULATORY_CARE_PROVIDER_SITE_OTHER): Payer: Self-pay | Admitting: *Deleted

## 2022-09-11 DIAGNOSIS — Z992 Dependence on renal dialysis: Secondary | ICD-10-CM | POA: Diagnosis not present

## 2022-09-11 DIAGNOSIS — E8779 Other fluid overload: Secondary | ICD-10-CM | POA: Diagnosis not present

## 2022-09-11 DIAGNOSIS — N186 End stage renal disease: Secondary | ICD-10-CM | POA: Diagnosis not present

## 2022-09-12 DIAGNOSIS — E8779 Other fluid overload: Secondary | ICD-10-CM | POA: Diagnosis not present

## 2022-09-12 DIAGNOSIS — N186 End stage renal disease: Secondary | ICD-10-CM | POA: Diagnosis not present

## 2022-09-12 DIAGNOSIS — Z992 Dependence on renal dialysis: Secondary | ICD-10-CM | POA: Diagnosis not present

## 2022-09-13 ENCOUNTER — Telehealth: Payer: Self-pay | Admitting: *Deleted

## 2022-09-13 DIAGNOSIS — Z992 Dependence on renal dialysis: Secondary | ICD-10-CM | POA: Diagnosis not present

## 2022-09-13 DIAGNOSIS — E8779 Other fluid overload: Secondary | ICD-10-CM | POA: Diagnosis not present

## 2022-09-13 DIAGNOSIS — S8262XA Displaced fracture of lateral malleolus of left fibula, initial encounter for closed fracture: Secondary | ICD-10-CM | POA: Diagnosis not present

## 2022-09-13 DIAGNOSIS — N186 End stage renal disease: Secondary | ICD-10-CM | POA: Diagnosis not present

## 2022-09-13 NOTE — Patient Outreach (Signed)
  Care Coordination   09/13/2022 Name: Margaret Huerta MRN: [id number] DOB: Mar 09, 1945   Care Coordination Outreach Attempts:  An unsuccessful telephone outreach was attempted today to offer the patient information about available care coordination services.  Follow Up Plan:  Additional outreach attempts will be made to offer the patient care coordination information and services.   Encounter Outcome:  No Answer. Left HIPAA compliant voicemail.   Care Coordination Interventions:  No, not indicated    Demetrios Loll, BSN, RN-BC RN Care Coordinator [facility]  [facility] Direct Dial: [phone] Main #: [phone]

## 2022-09-14 DIAGNOSIS — Z992 Dependence on renal dialysis: Secondary | ICD-10-CM | POA: Diagnosis not present

## 2022-09-14 DIAGNOSIS — E8779 Other fluid overload: Secondary | ICD-10-CM | POA: Diagnosis not present

## 2022-09-14 DIAGNOSIS — N186 End stage renal disease: Secondary | ICD-10-CM | POA: Diagnosis not present

## 2022-09-17 DIAGNOSIS — Z992 Dependence on renal dialysis: Secondary | ICD-10-CM | POA: Diagnosis not present

## 2022-09-17 DIAGNOSIS — N186 End stage renal disease: Secondary | ICD-10-CM | POA: Diagnosis not present

## 2022-09-17 DIAGNOSIS — E1122 Type 2 diabetes mellitus with diabetic chronic kidney disease: Secondary | ICD-10-CM | POA: Diagnosis not present

## 2022-09-17 DIAGNOSIS — E8779 Other fluid overload: Secondary | ICD-10-CM | POA: Diagnosis not present

## 2022-09-18 ENCOUNTER — Ambulatory Visit (INDEPENDENT_AMBULATORY_CARE_PROVIDER_SITE_OTHER): Payer: Medicare Other | Admitting: Gastroenterology

## 2022-09-18 ENCOUNTER — Encounter (INDEPENDENT_AMBULATORY_CARE_PROVIDER_SITE_OTHER): Payer: Self-pay | Admitting: Gastroenterology

## 2022-09-18 VITALS — BP 116/75 | HR 59 | Temp 98.2°F | Ht 62.0 in | Wt 166.9 lb

## 2022-09-18 DIAGNOSIS — R1012 Left upper quadrant pain: Secondary | ICD-10-CM | POA: Diagnosis not present

## 2022-09-18 DIAGNOSIS — N186 End stage renal disease: Secondary | ICD-10-CM | POA: Diagnosis not present

## 2022-09-18 DIAGNOSIS — R7989 Other specified abnormal findings of blood chemistry: Secondary | ICD-10-CM

## 2022-09-18 DIAGNOSIS — R110 Nausea: Secondary | ICD-10-CM

## 2022-09-18 DIAGNOSIS — E8779 Other fluid overload: Secondary | ICD-10-CM | POA: Diagnosis not present

## 2022-09-18 DIAGNOSIS — Z992 Dependence on renal dialysis: Secondary | ICD-10-CM | POA: Diagnosis not present

## 2022-09-18 NOTE — H&P (View-Only) (Signed)
 Referring Provider: Carylon Perches, MD Primary Care Physician:  Carylon Perches, MD Primary GI Physician: Dr. Levon Hedger   Chief Complaint  Patient presents with   elevated LFT's    Follow up on left upper side pain and elevated LFT's. Feels like she is swollen on left side where the pain is.    HPI:   Margaret Huerta is a 77 y.o. 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  Last seen April 2024, at that time having constipation, LUQ pain, bloating.   Recommended to have KUB, continue colace.  KUB 05/2022 was negative, she underwent CT A/P with contrast in may which was grossly unremarkable (liver unremarkable).  AST 73, ALT 68, plt count 171k, Alk phos 128, T bili 0.3, albumin 3.8 in July 2024 (LFTs normal in September 2023)  Present:  Patient states that she saw PCP a few weeks ago and was told LFTs were up, no previous history of elevated LFTs. No new medications recently. She does not take any otc supplements or herbs/teas other than vitamin D. She takes tylenol on occasion for headaches. She does do home dialysis which she has been doing since December.   She notes she continues to have LUQ pain that radiates around to her back. Pain is there constantly but sometimes will have a sharper type pain which can be in her abdomen or more in her back. She notes that her left abdomen feels more swollen since earlier this year. She does endorse nausea which is usually daily, may have vomiting 2x/week, this has been ongoing since dialysis, worse when she is given iron. She feels that eating makes her pain worse. Denies GERD symptoms. No rectal bleeding or melena, unless she takes pepto which she states dulls the LUQ pain. She took protonix  40mg  daily for a short time in June but did not have any improvement in symptoms with this.   She notes that she has soft stools but within about 30 minutes of eating she is having to have a BM, she will have another BM about 15-30 minutes after 1st episode which is more pudding consistency, then another more liquid BM 15-30 minutes after that one and then she will be fine.   Colonoscopy October 2023: - two 8 mm polyp from transverse colon and cecum -8 mm polyp in sigmoid colon -Sigmoid and transverse diverticulosis -Significant looping of the colon -Nonbleeding internal hemorrhoids -Pathology revealed tubular adenomas -Recommended repeat in 5 years.  Recommendations:    Past Medical History:  Diagnosis Date   Anemia    Anxiety    Cervical cancer (HCC)    Chronic bronchitis (HCC)    "get it q yr"   Chronic lower back pain    Depression    Febrile seizure (HCC)    "as a child"   Fibromyalgia    GERD (gastroesophageal reflux disease)    Gout    Hemodialysis patient (HCC)    History of blood transfusion    "S/P tonsillectomy"   History of hiatal hernia    Hx of cardiovascular stress test 05/14/2014   false positive Myoview   Hyperlipidemia    Hypertension    Migraine    "monthly" (05/26/2014)   Neuropathy    Osteoporosis    Parathyroid disease (  HCC)    "my PHT levels run high; I can't take calcium"   Pneumonia "several times"   Renal cancer (HCC)    "right"-s/p nephrectomy   Renal insufficiency    "left kidney works at 40-60%" (05/26/2014)   Sleep apnea    "wore mask for 2 months; could not take it" (05/26/2014)   Type II diabetes mellitus (HCC)    Typhus fever    "as a child"    Past Surgical History:  Procedure Laterality Date   ABDOMINAL HERNIA REPAIR  02/2013   "in [place]"   ABDOMINAL HYSTERECTOMY  1977   "partial"   APPENDECTOMY     AV FISTULA PLACEMENT Left 06/13/2021   Procedure: LEFT ARM ARTERIOVENOUS FISTULA CREATION;  Surgeon: Larina Earthly, MD;   Location: [facility];  Service: Vascular;  Laterality: Left;   BACK SURGERY     BILATERAL SALPINGOOPHORECTOMY Bilateral ~ 1993   CARDIAC CATHETERIZATION  2003, April 2016   normal coronaries   CATARACT EXTRACTION W/ INTRAOCULAR LENS  IMPLANT, BILATERAL  2015   COLONOSCOPY N/A 11/25/2014   Procedure: COLONOSCOPY;  Surgeon: Malissa Hippo, MD;  Location: [facility];  Service: Endoscopy;  Laterality: N/A;   COLONOSCOPY WITH PROPOFOL N/A 12/12/2021   Procedure: COLONOSCOPY WITH PROPOFOL;  Surgeon: Dolores Frame, MD;  Location: [facility];  Service: Gastroenterology;  Laterality: N/A;  130 ASA 3 patient has dialysis Mon Wed & Fri, per Soledad Gerlach pt knows new arrival time   CYSTOSCOPY W/ Labrum MANIPULATION  1988   DIAGNOSTIC LAPAROSCOPY  1975   DILATION AND CURETTAGE OF UTERUS     ESOPHAGOGASTRODUODENOSCOPY N/A 11/25/2014   Procedure: ESOPHAGOGASTRODUODENOSCOPY (EGD);  Surgeon: Malissa Hippo, MD;  [facility];  Service: Endoscopy;  Laterality: N/A;  9:30 - moved to 11:25 - [name] to notify pt   ESOPHAGOGASTRODUODENOSCOPY (EGD) WITH ESOPHAGEAL DILATION  1997   EYE SURGERY     GASTRIC BYPASS  2012   HERNIA REPAIR     2015   HIATAL HERNIA REPAIR  1996   INCONTINENCE SURGERY  1983   INSERTION OF DIALYSIS CATHETER Right 09/19/2021   Procedure: INSERTION OF TUNNELED DIALYSIS CATHETER;  Surgeon: Larina Earthly, MD;  Location: [facility];  Service: Vascular;  Laterality: Right;   KNEE ARTHROSCOPY Left 2000's   LAPAROSCOPIC CHOLECYSTECTOMY  1990's   LEFT HEART CATHETERIZATION WITH CORONARY ANGIOGRAM N/A 05/27/2014   Procedure: LEFT HEART CATHETERIZATION WITH CORONARY ANGIOGRAM;  Surgeon: Runell Gess, MD;  Location: [facility] CATH LAB;  Service: Cardiovascular;  Laterality: N/A;   LIGATION OF ARTERIOVENOUS  FISTULA Left 09/19/2021   Procedure: LIGATION OF ARTERIOVENOUS  FISTULA;  Surgeon: Larina Earthly, MD;  Location: [facility];  Service: Vascular;  Laterality: Left;   LUMBAR DISC SURGERY   2001   "herniated discs"   MOUTH SURGERY  1991   "drilled into gum and put tooth implants uppers"   NEPHRECTOMY Right 2002   "cancer"   POLYPECTOMY  12/12/2021   Procedure: POLYPECTOMY;  Surgeon: Marguerita Merles, Reuel Boom, MD;  Location: [facility];  Service: Gastroenterology;;   TMJ ARTHROPLASTY  1988   TONSILLECTOMY  1976   UPPER GI ENDOSCOPY      Current Outpatient Medications  Medication Sig Dispense Refill   acetaminophen (TYLENOL) 500 MG tablet Take 1,000 mg by mouth every 6 (six) hours as needed (for pain.).     allopurinol (ZYLOPRIM) 300 MG tablet Take 300 mg by mouth in the morning.  12  ALPRAZolam (XANAX) 0.25 MG tablet Take 0.25 mg by mouth every Monday, Wednesday, and Friday with hemodialysis.     aspirin EC 81 MG tablet Take 81 mg by mouth in the morning. Swallow whole.     atenolol (TENORMIN) 50 MG tablet Take 0.5 tablets (25 mg total) by mouth 2 (two) times daily. 90 tablet 1   Cholecalciferol (VITAMIN D3) 250 MCG (10000 UT) TABS Take 10,000 Units by mouth in the morning.     docusate sodium (COLACE) 50 MG capsule Take 50 mg by mouth at bedtime.     DULoxetine (CYMBALTA) 60 MG capsule Take 60 mg by mouth in the morning.     furosemide (LASIX) 40 MG tablet Take 40 mg by mouth in the morning. 40 mg in the evening Monday- Friday, 40 mg twice daily on Saturday & Sundays.     gabapentin (NEURONTIN) 100 MG capsule Take 100 mg by mouth daily.     isosorbide dinitrate (ISORDIL) 10 MG tablet Take 1 tablet by mouth twice daily 180 tablet 2   isosorbide dinitrate (ISORDIL) 20 MG tablet Take 0.5 tablets (10 mg total) by mouth 2 (two) times daily. 90 tablet 2   NITROSTAT 0.4 MG SL tablet Place 1 tablet (0.4 mg total) under the tongue as needed. (Patient taking differently: Place 0.4 mg under the tongue every 5 (five) minutes x 3 doses as needed.) 25 tablet 3   ondansetron (ZOFRAN-ODT) 4 MG disintegrating tablet Take 1 tablet (4 mg total) by mouth every 8 (eight) hours as needed for  nausea or vomiting. 5 tablet 0   pantoprazole (PROTONIX) 40 MG tablet Take one tablet 30 minutes before breakfast. 90 tablet 0   rosuvastatin (CRESTOR) 10 MG tablet Take 1 tablet (10 mg total) by mouth daily.     sevelamer carbonate (RENVELA) 800 MG tablet Take 800 mg by mouth 3 (three) times daily.     sodium bicarbonate 650 MG tablet Take 650 mg by mouth 2 (two) times daily.     calcitRIOL (ROCALTROL) 0.25 MCG capsule Take 0.25 mcg by mouth in the morning.     No current facility-administered medications for this visit.    Allergies as of 09/18/2022 - Review Complete 06/23/2022  Allergen Reaction Noted   Demerol [meperidine] Other (See Comments) 06/10/2012   Lyrica [pregabalin] Swelling 08/08/2017   Vicodin [hydrocodone-acetaminophen] Other (See Comments) 06/10/2012   Ace inhibitors Cough 07/07/2014   Aleve [naproxen] Other (See Comments)    Codeine Other (See Comments)    Contrast media [iodinated contrast media] Nausea Only and Other (See Comments) 06/01/2016   Fentanyl Itching 05/04/2014   Gluten meal Diarrhea 12/11/2021   Lactose intolerance (gi) Other (See Comments) 12/11/2021   Morphine Nausea And Vomiting    Neurontin [gabapentin] Nausea Only, Swelling, and Other (See Comments) 06/05/2021   Norvasc [amlodipine] Swelling 09/24/2019   Nsaids Other (See Comments) 06/05/2021   Other Other (See Comments) 06/05/2021   Dilaudid [hydromorphone hcl] Itching, Nausea And Vomiting, and Rash 06/10/2012    Family History  Problem Relation Age of Onset   Emphysema Father    Heart disease Father    Clotting disorder Mother    Diabetes Mother    Kidney disease Mother    Allergies Other        whole family per pt   Lung cancer Maternal Uncle    Alcohol abuse Maternal Uncle     Social History   Socioeconomic History   Marital status: Married    Spouse name: Margaret Huerta   Number of children: 1   Years of education: 12   Highest education level: 12th grade  Occupational History    Occupation: Housewife  Tobacco Use   Smoking status: Never    Passive exposure: Never   Smokeless tobacco: Never  Vaping Use   Vaping status: Never Used  Substance and Sexual Activity   Alcohol use: No    Alcohol/week: 0.0 standard drinks of alcohol   Drug use: No   Sexual activity: Yes    Partners: Male    Birth control/protection: Surgical  Other Topics Concern   Not on file  Social History Narrative   Not on file   Social Determinants of Health   Financial Resource Strain: Low Risk  (11/06/2021)   Received from [facility], [facility]   Overall Financial Resource Strain (CARDIA)    Difficulty of Paying Living Expenses: Not hard at all  Food Insecurity: No Food Insecurity (11/07/2021)   Hunger Vital Sign    Worried About Running Out of Food in the Last Year: Never true    Ran Out of Food in the Last Year: Never true  Transportation Needs: No Transportation Needs (11/07/2021)   PRAPARE - Administrator, Civil Service (Medical): No    Lack of Transportation (Non-Medical): No  Physical Activity: Inactive (11/06/2021)   Received from [facility], [facility]   Exercise Vital Sign    Days of Exercise per Week: 0 days    Minutes of Exercise per Session: 0 min  Stress: No Stress Concern Present (11/14/2021)   Received from [facility], [facility] of Occupational Health - Occupational Stress Questionnaire    Feeling of Stress : Only a little  Social Connections: Moderately Integrated (11/06/2021)   Received from [facility]   Social Network    How would you rate your social network (family, work, friends)?: Adequate participation with social networks    Review of systems General: negative for malaise, night sweats, fever, chills, weight loss Neck: Negative for lumps, goiter, pain and significant neck swelling Resp: Negative for cough, wheezing, dyspnea at rest CV: Negative for chest pain, leg swelling,  palpitations, orthopnea GI: denies melena, hematochezia, nausea, vomiting, diarrhea, constipation, dysphagia, odyonophagia, early satiety or unintentional weight loss. +LUQ pain +nausea  MSK: Negative for joint pain or swelling, back pain, and muscle pain. Derm: Negative for itching or rash Psych: Denies depression, anxiety, memory loss, confusion. No homicidal or suicidal ideation.  Heme: Negative for prolonged bleeding, bruising easily, and swollen nodes. Endocrine: Negative for cold or heat intolerance, polyuria, polydipsia and goiter. Neuro: negative for tremor, gait imbalance, syncope and seizures. The remainder of the review of systems is noncontributory.  Physical Exam: BP 116/75 (BP Location: Left Arm, Patient Position: Sitting, Cuff Size: Large)   Pulse (!) 59   Temp 98.2 F (36.8 C) (Oral)   Ht 5\' 2"  (1.575 m)   Wt 166 lb 14.4 oz (75.7 kg)   BMI 30.53 kg/m  General:   Alert and oriented. No distress noted. Pleasant and cooperative.  Head:  Normocephalic and atraumatic. Eyes:  Conjuctiva clear without scleral icterus. Mouth:  Oral mucosa pink and moist. Good dentition. No lesions. Heart: Normal rate and rhythm, s1 and s2 heart sounds present.  Lungs: Clear lung sounds in all lobes. Respirations equal and unlabored. Abdomen:  +BS, soft, and non-distended. LUQ tenderness. No rebound or guarding. No HSM or masses noted. Derm: No palmar erythema or jaundice  Msk:  Symmetrical without gross deformities. Normal posture. Extremities:  Without edema. Neurologic:  Alert and  oriented x4 Psych:  Alert and cooperative. Normal mood and affect.  Invalid input(s): "6 MONTHS"   ASSESSMENT: LLASMIN MACIK is a 77 y.o. female presenting today for follow up of LUQ pain and with elevated LFTs.  Elevated LFTs: AST 73, ALT 68, no recent new meds, no herbal supplements or teas, no frequent tylenol use, does not drink alcohol. NAFLD score 0.38, indeterminate. She is notably on cymbalta which can  cause elevation of LFTs though has been on this for years, however, etiology at this time is unclear. Will obtain US liver elastography given indeterminate NAFLD score to rule out fibrosis/cirrhosis and serologies to include ANA, AMA, ASMA, ceruloplasmin, A1A, IgG, IgM, acute hep panel, iron panel to rule out underlying causes of elevation.  LUQ Pain: ongoing, recent KUB and CT A/P without findings to explain symptoms, also with some associated nausea, PPI therapy did not improve her symptoms. Recommend proceeding with EGD for further evaluation as I cannot rule out PUD, gastritis, duodenitis, H pylori infection. Indications, risks and benefits of procedure discussed in detail with patient. Patient verbalized understanding and is in agreement to proceed with EGD.   PLAN:  RUQ [place] elastography 2. Liver serologies as above  3. Schedule EGD-ASA III   All questions were answered, patient verbalized understanding and is in agreement with plan as outlined above.   Follow Up: 3 months   Margaret L. Jeanmarie Hubert, MSN, APRN, AGNP-C Adult-Gerontology Nurse Practitioner [facility]  I have reviewed the note and agree with the APP's assessment as described in this progress note  Katrinka Blazing, MD Gastroenterology and Hepatology [facility]

## 2022-09-18 NOTE — Patient Instructions (Addendum)
We will get you scheduled for upper endoscopy to further evaluate your upper abdominal pain and nausea I am ordering an US of the liver as well as further lab work given elevated liver enzymes  Follow up 3 months  It was a pleasure to see you today. I want to create trusting relationships with patients and provide genuine, compassionate, and quality care. I truly value your feedback! please be on the lookout for a survey regarding your visit with me today. I appreciate your input about our visit and your time in completing this!     L. Jeanmarie Hubert, MSN, APRN, AGNP-C Adult-Gerontology Nurse Practitioner [facility] at [facility]

## 2022-09-18 NOTE — Progress Notes (Addendum)
Referring Provider: Carylon Perches, MD Primary Care Physician:  Carylon Perches, MD Primary GI Physician: Dr. Levon Hedger   Chief Complaint  Patient presents with   elevated LFT's    Follow up on left upper side pain and elevated LFT's. Feels like she is swollen on left side where the pain is.    HPI:   Margaret Huerta is a 77 y.o. 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  Last seen April 2024, at that time having constipation, LUQ pain, bloating.   Recommended to have KUB, continue colace.  KUB 05/2022 was negative, she underwent CT A/P with contrast in may which was grossly unremarkable (liver unremarkable).  AST 73, ALT 68, plt count 171k, Alk phos 128, T bili 0.3, albumin 3.8 in July 2024 (LFTs normal in September 2023)  Present:  Patient states that she saw PCP a few weeks ago and was told LFTs were up, no previous history of elevated LFTs. No new medications recently. She does not take any otc supplements or herbs/teas other than vitamin D. She takes tylenol on occasion for headaches. She does do home dialysis which she has been doing since December.   She notes she continues to have LUQ pain that radiates around to her back. Pain is there constantly but sometimes will have a sharper type pain which can be in her abdomen or more in her back. She notes that her left abdomen feels more swollen since earlier this year. She does endorse nausea which is usually daily, may have vomiting 2x/week, this has been ongoing since dialysis, worse when she is given iron. She feels that eating makes her pain worse. Denies GERD symptoms. No rectal bleeding or melena, unless she takes pepto which she states dulls the LUQ pain. She took protonix  40mg  daily for a short time in June but did not have any improvement in symptoms with this.   She notes that she has soft stools but within about 30 minutes of eating she is having to have a BM, she will have another BM about 15-30 minutes after 1st episode which is more pudding consistency, then another more liquid BM 15-30 minutes after that one and then she will be fine.   Colonoscopy October 2023: - two 8 mm polyp from transverse colon and cecum -8 mm polyp in sigmoid colon -Sigmoid and transverse diverticulosis -Significant looping of the colon -Nonbleeding internal hemorrhoids -Pathology revealed tubular adenomas -Recommended repeat in 5 years.  Recommendations:    Past Medical History:  Diagnosis Date   Anemia    Anxiety    Cervical cancer (HCC)    Chronic bronchitis (HCC)    "get it q yr"   Chronic lower back pain    Depression    Febrile seizure (HCC)    "as a child"   Fibromyalgia    GERD (gastroesophageal reflux disease)    Gout    Hemodialysis patient (HCC)    History of blood transfusion    "S/P tonsillectomy"   History of hiatal hernia    Hx of cardiovascular stress test 05/14/2014   false positive Myoview   Hyperlipidemia    Hypertension    Migraine    "monthly" (05/26/2014)   Neuropathy    Osteoporosis    Parathyroid disease (  HCC)    "my PHT levels run high; I can't take calcium"   Pneumonia "several times"   Renal cancer (HCC)    "right"-s/p nephrectomy   Renal insufficiency    "left kidney works at 40-60%" (05/26/2014)   Sleep apnea    "wore mask for 2 months; could not take it" (05/26/2014)   Type II diabetes mellitus (HCC)    Typhus fever    "as a child"    Past Surgical History:  Procedure Laterality Date   ABDOMINAL HERNIA REPAIR  02/2013   "in [place]"   ABDOMINAL HYSTERECTOMY  1977   "partial"   APPENDECTOMY     AV FISTULA PLACEMENT Left 06/13/2021   Procedure: LEFT ARM ARTERIOVENOUS FISTULA CREATION;  Surgeon: Larina Earthly, MD;   Location: [facility];  Service: Vascular;  Laterality: Left;   BACK SURGERY     BILATERAL SALPINGOOPHORECTOMY Bilateral ~ 1993   CARDIAC CATHETERIZATION  2003, April 2016   normal coronaries   CATARACT EXTRACTION W/ INTRAOCULAR LENS  IMPLANT, BILATERAL  2015   COLONOSCOPY N/A 11/25/2014   Procedure: COLONOSCOPY;  Surgeon: Malissa Hippo, MD;  Location: [facility];  Service: Endoscopy;  Laterality: N/A;   COLONOSCOPY WITH PROPOFOL N/A 12/12/2021   Procedure: COLONOSCOPY WITH PROPOFOL;  Surgeon: Dolores Frame, MD;  Location: [facility];  Service: Gastroenterology;  Laterality: N/A;  130 ASA 3 patient has dialysis Mon Wed & Fri, per Soledad Gerlach pt knows new arrival time   CYSTOSCOPY W/ Labrum MANIPULATION  1988   DIAGNOSTIC LAPAROSCOPY  1975   DILATION AND CURETTAGE OF UTERUS     ESOPHAGOGASTRODUODENOSCOPY N/A 11/25/2014   Procedure: ESOPHAGOGASTRODUODENOSCOPY (EGD);  Surgeon: Malissa Hippo, MD;  [facility]: [facility];  Service: Endoscopy;  Laterality: N/A;  9:30 - moved to 11:25 - [name] to notify pt   ESOPHAGOGASTRODUODENOSCOPY (EGD) WITH ESOPHAGEAL DILATION  1997   EYE SURGERY     GASTRIC BYPASS  2012   HERNIA REPAIR     2015   HIATAL HERNIA REPAIR  1996   INCONTINENCE SURGERY  1983   INSERTION OF DIALYSIS CATHETER Right 09/19/2021   Procedure: INSERTION OF TUNNELED DIALYSIS CATHETER;  Surgeon: Larina Earthly, MD;  Location: [facility];  Service: Vascular;  Laterality: Right;   KNEE ARTHROSCOPY Left 2000's   LAPAROSCOPIC CHOLECYSTECTOMY  1990's   LEFT HEART CATHETERIZATION WITH CORONARY ANGIOGRAM N/A 05/27/2014   Procedure: LEFT HEART CATHETERIZATION WITH CORONARY ANGIOGRAM;  Surgeon: Runell Gess, MD;  Location: [facility];  Service: Cardiovascular;  Laterality: N/A;   LIGATION OF ARTERIOVENOUS  FISTULA Left 09/19/2021   Procedure: LIGATION OF ARTERIOVENOUS  FISTULA;  Surgeon: Larina Earthly, MD;  Location: [facility];  Service: Vascular;  Laterality: Left;   LUMBAR DISC SURGERY   2001   "herniated discs"   MOUTH SURGERY  1991   "drilled into gum and put tooth implants uppers"   NEPHRECTOMY Right 2002   "cancer"   POLYPECTOMY  12/12/2021   Procedure: POLYPECTOMY;  Surgeon: Marguerita Merles, Reuel Boom, MD;  Location: [facility];  Service: Gastroenterology;;   TMJ ARTHROPLASTY  1988   TONSILLECTOMY  1976   UPPER GI ENDOSCOPY      Current Outpatient Medications  Medication Sig Dispense Refill   acetaminophen (TYLENOL) 500 MG tablet Take 1,000 mg by mouth every 6 (six) hours as needed (for pain.).     allopurinol (ZYLOPRIM) 300 MG tablet Take 300 mg by mouth in the morning.  12  ALPRAZolam (XANAX) 0.25 MG tablet Take 0.25 mg by mouth every Monday, Wednesday, and Friday with hemodialysis.     aspirin EC 81 MG tablet Take 81 mg by mouth in the morning. Swallow whole.     atenolol (TENORMIN) 50 MG tablet Take 0.5 tablets (25 mg total) by mouth 2 (two) times daily. 90 tablet 1   Cholecalciferol (VITAMIN D3) 250 MCG (10000 UT) TABS Take 10,000 Units by mouth in the morning.     docusate sodium (COLACE) 50 MG capsule Take 50 mg by mouth at bedtime.     DULoxetine (CYMBALTA) 60 MG capsule Take 60 mg by mouth in the morning.     furosemide (LASIX) 40 MG tablet Take 40 mg by mouth in the morning. 40 mg in the evening Monday- Friday, 40 mg twice daily on Saturday & Sundays.     gabapentin (NEURONTIN) 100 MG capsule Take 100 mg by mouth daily.     isosorbide dinitrate (ISORDIL) 10 MG tablet Take 1 tablet by mouth twice daily 180 tablet 2   isosorbide dinitrate (ISORDIL) 20 MG tablet Take 0.5 tablets (10 mg total) by mouth 2 (two) times daily. 90 tablet 2   NITROSTAT 0.4 MG SL tablet Place 1 tablet (0.4 mg total) under the tongue as needed. (Patient taking differently: Place 0.4 mg under the tongue every 5 (five) minutes x 3 doses as needed.) 25 tablet 3   ondansetron (ZOFRAN-ODT) 4 MG disintegrating tablet Take 1 tablet (4 mg total) by mouth every 8 (eight) hours as needed for  nausea or vomiting. 5 tablet 0   pantoprazole (PROTONIX) 40 MG tablet Take one tablet 30 minutes before breakfast. 90 tablet 0   rosuvastatin (CRESTOR) 10 MG tablet Take 1 tablet (10 mg total) by mouth daily.     sevelamer carbonate (RENVELA) 800 MG tablet Take 800 mg by mouth 3 (three) times daily.     sodium bicarbonate 650 MG tablet Take 650 mg by mouth 2 (two) times daily.     calcitRIOL (ROCALTROL) 0.25 MCG capsule Take 0.25 mcg by mouth in the morning.     No current facility-administered medications for this visit.    Allergies as of 09/18/2022 - Review Complete 06/23/2022  Allergen Reaction Noted   Demerol [meperidine] Other (See Comments) 06/10/2012   Lyrica [pregabalin] Swelling 08/08/2017   Vicodin [hydrocodone-acetaminophen] Other (See Comments) 06/10/2012   Ace inhibitors Cough 07/07/2014   Aleve [naproxen] Other (See Comments)    Codeine Other (See Comments)    Contrast media [iodinated contrast media] Nausea Only and Other (See Comments) 06/01/2016   Fentanyl Itching 05/04/2014   Gluten meal Diarrhea 12/11/2021   Lactose intolerance (gi) Other (See Comments) 12/11/2021   Morphine Nausea And Vomiting    Neurontin [gabapentin] Nausea Only, Swelling, and Other (See Comments) 06/05/2021   Norvasc [amlodipine] Swelling 09/24/2019   Nsaids Other (See Comments) 06/05/2021   Other Other (See Comments) 06/05/2021   Dilaudid [hydromorphone hcl] Itching, Nausea And Vomiting, and Rash 06/10/2012    Family History  Problem Relation Age of Onset   Emphysema Father    Heart disease Father    Clotting disorder Mother    Diabetes Mother    Kidney disease Mother    Allergies Other        whole family per pt   Lung cancer Maternal Uncle    Alcohol abuse Maternal Uncle     Social History   Socioeconomic History   Marital status: Married    Spouse name: Onalee Hua  Losee   Number of children: 1   Years of education: 12   Highest education level: 12th grade  Occupational History    Occupation: Housewife  Tobacco Use   Smoking status: Never    Passive exposure: Never   Smokeless tobacco: Never  Vaping Use   Vaping status: Never Used  Substance and Sexual Activity   Alcohol use: No    Alcohol/week: 0.0 standard drinks of alcohol   Drug use: No   Sexual activity: Yes    Partners: Male    Birth control/protection: Surgical  Other Topics Concern   Not on file  Social History Narrative   Not on file   Social Determinants of Health   Financial Resource Strain: Low Risk  (11/06/2021)   Received from [facility], [facility]   Overall Financial Resource Strain (CARDIA)    Difficulty of Paying Living Expenses: Not hard at all  Food Insecurity: No Food Insecurity (11/07/2021)   Hunger Vital Sign    Worried About Running Out of Food in the Last Year: Never true    Ran Out of Food in the Last Year: Never true  Transportation Needs: No Transportation Needs (11/07/2021)   PRAPARE - Administrator, Civil Service (Medical): No    Lack of Transportation (Non-Medical): No  Physical Activity: Inactive (11/06/2021)   Received from [facility], [facility]   Exercise Vital Sign    Days of Exercise per Week: 0 days    Minutes of Exercise per Session: 0 min  Stress: No Stress Concern Present (11/14/2021)   Received from [facility], [facility] of Occupational Health - Occupational Stress Questionnaire    Feeling of Stress : Only a little  Social Connections: Moderately Integrated (11/06/2021)   [facility]   Social Network    How would you rate your social network (family, work, friends)?: Adequate participation with social networks    Review of systems General: negative for malaise, night sweats, fever, chills, weight loss Neck: Negative for lumps, goiter, pain and significant neck swelling Resp: Negative for cough, wheezing, dyspnea at rest CV: Negative for chest pain, leg swelling,  palpitations, orthopnea GI: denies melena, hematochezia, nausea, vomiting, diarrhea, constipation, dysphagia, odyonophagia, early satiety or unintentional weight loss. +LUQ pain +nausea  MSK: Negative for joint pain or swelling, back pain, and muscle pain. Derm: Negative for itching or rash Psych: Denies depression, anxiety, memory loss, confusion. No homicidal or suicidal ideation.  Heme: Negative for prolonged bleeding, bruising easily, and swollen nodes. Endocrine: Negative for cold or heat intolerance, polyuria, polydipsia and goiter. Neuro: negative for tremor, gait imbalance, syncope and seizures. The remainder of the review of systems is noncontributory.  Physical Exam: BP 116/75 (BP Location: Left Arm, Patient Position: Sitting, Cuff Size: Large)   Pulse (!) 59   Temp 98.2 F (36.8 C) (Oral)   Ht 5\' 2"  (1.575 m)   Wt 166 lb 14.4 oz (75.7 kg)   BMI 30.53 kg/m  General:   Alert and oriented. No distress noted. Pleasant and cooperative.  Head:  Normocephalic and atraumatic. Eyes:  Conjuctiva clear without scleral icterus. Mouth:  Oral mucosa pink and moist. Good dentition. No lesions. Heart: Normal rate and rhythm, s1 and s2 heart sounds present.  Lungs: Clear lung sounds in all lobes. Respirations equal and unlabored. Abdomen:  +BS, soft, and non-distended. LUQ tenderness. No rebound or guarding. No HSM or masses noted. Derm: No palmar erythema or jaundice  Msk:  Symmetrical without gross deformities. Normal posture. Extremities:  Without edema. Neurologic:  Alert and  oriented x4 Psych:  Alert and cooperative. Normal mood and affect.  Invalid input(s): "6 MONTHS"   ASSESSMENT: LLASMIN MACIK is a 77 y.o. female presenting today for follow up of LUQ pain and with elevated LFTs.  Elevated LFTs: AST 73, ALT 68, no recent new meds, no herbal supplements or teas, no frequent tylenol use, does not drink alcohol. NAFLD score 0.38, indeterminate. She is notably on cymbalta which can  cause elevation of LFTs though has been on this for years, however, etiology at this time is unclear. Will obtain US liver elastography given indeterminate NAFLD score to rule out fibrosis/cirrhosis and serologies to include ANA, AMA, ASMA, ceruloplasmin, A1A, IgG, IgM, acute hep panel, iron panel to rule out underlying causes of elevation.  LUQ Pain: ongoing, recent KUB and CT A/P without findings to explain symptoms, also with some associated nausea, PPI therapy did not improve her symptoms. Recommend proceeding with EGD for further evaluation as I cannot rule out PUD, gastritis, duodenitis, H pylori infection. Indications, risks and benefits of procedure discussed in detail with patient. Patient verbalized understanding and is in agreement to proceed with EGD.   PLAN:  RUQ [place] elastography 2. Liver serologies as above  3. Schedule EGD-ASA III   All questions were answered, patient verbalized understanding and is in agreement with plan as outlined above.   Follow Up: 3 months    L. Jeanmarie Hubert, MSN, APRN, AGNP-C Adult-Gerontology Nurse Practitioner [facility]  I have reviewed the note and agree with the APP's assessment as described in this progress note  Katrinka Blazing, MD Gastroenterology and Hepatology [facility]

## 2022-09-20 ENCOUNTER — Telehealth: Payer: Self-pay | Admitting: *Deleted

## 2022-09-20 DIAGNOSIS — G5602 Carpal tunnel syndrome, left upper limb: Secondary | ICD-10-CM | POA: Diagnosis not present

## 2022-09-20 DIAGNOSIS — E8779 Other fluid overload: Secondary | ICD-10-CM | POA: Diagnosis not present

## 2022-09-20 DIAGNOSIS — Z992 Dependence on renal dialysis: Secondary | ICD-10-CM | POA: Diagnosis not present

## 2022-09-20 DIAGNOSIS — N186 End stage renal disease: Secondary | ICD-10-CM | POA: Diagnosis not present

## 2022-09-20 DIAGNOSIS — M65331 Trigger finger, right middle finger: Secondary | ICD-10-CM | POA: Diagnosis not present

## 2022-09-20 NOTE — Progress Notes (Signed)
  Care Coordination  Outreach Note  09/20/2022 Name: MARTIKA SANGIACOMO MRN: [id number] DOB: 01/09/1946   Care Coordination Outreach Attempts: An unsuccessful telephone outreach was attempted today to offer the patient information about available care coordination services.  Follow Up Plan:  Additional outreach attempts will be made to offer the patient care coordination information and services.   Encounter Outcome:  No Answer  Gwenevere Ghazi  Care Coordination Care Guide  Direct Dial: [phone]

## 2022-09-21 DIAGNOSIS — N186 End stage renal disease: Secondary | ICD-10-CM | POA: Diagnosis not present

## 2022-09-21 DIAGNOSIS — Z992 Dependence on renal dialysis: Secondary | ICD-10-CM | POA: Diagnosis not present

## 2022-09-21 DIAGNOSIS — E8779 Other fluid overload: Secondary | ICD-10-CM | POA: Diagnosis not present

## 2022-09-21 NOTE — Progress Notes (Signed)
  Care Coordination  Outreach Note  09/21/2022 Name: Margaret Huerta MRN: [id number] DOB: 09-Jun-1945   Care Coordination Outreach Attempts: A third unsuccessful outreach was attempted today to offer the patient with information about available care coordination services.  Follow Up Plan:  No further outreach attempts will be made at this time. We have been unable to contact the patient to offer or enroll patient in care coordination services  Encounter Outcome:  No Answer  Christie Nottingham  Care Coordination Care Guide  Direct Dial: [phone]

## 2022-09-22 ENCOUNTER — Other Ambulatory Visit: Payer: Self-pay | Admitting: Cardiology

## 2022-09-24 ENCOUNTER — Telehealth: Payer: Self-pay | Admitting: Cardiology

## 2022-09-24 DIAGNOSIS — E8779 Other fluid overload: Secondary | ICD-10-CM | POA: Diagnosis not present

## 2022-09-24 DIAGNOSIS — D649 Anemia, unspecified: Secondary | ICD-10-CM | POA: Diagnosis not present

## 2022-09-24 DIAGNOSIS — N186 End stage renal disease: Secondary | ICD-10-CM | POA: Diagnosis not present

## 2022-09-24 DIAGNOSIS — D631 Anemia in chronic kidney disease: Secondary | ICD-10-CM | POA: Diagnosis not present

## 2022-09-24 DIAGNOSIS — E8770 Fluid overload, unspecified: Secondary | ICD-10-CM | POA: Diagnosis not present

## 2022-09-24 DIAGNOSIS — Z23 Encounter for immunization: Secondary | ICD-10-CM | POA: Diagnosis not present

## 2022-09-24 DIAGNOSIS — Z992 Dependence on renal dialysis: Secondary | ICD-10-CM | POA: Diagnosis not present

## 2022-09-24 DIAGNOSIS — E1140 Type 2 diabetes mellitus with diabetic neuropathy, unspecified: Secondary | ICD-10-CM | POA: Diagnosis not present

## 2022-09-24 DIAGNOSIS — N2581 Secondary hyperparathyroidism of renal origin: Secondary | ICD-10-CM | POA: Diagnosis not present

## 2022-09-24 DIAGNOSIS — E119 Type 2 diabetes mellitus without complications: Secondary | ICD-10-CM | POA: Diagnosis not present

## 2022-09-24 DIAGNOSIS — D689 Coagulation defect, unspecified: Secondary | ICD-10-CM | POA: Diagnosis not present

## 2022-09-24 NOTE — Telephone Encounter (Signed)
Pt c/o medication issue:  1. Name of Medication:   NITROSTAT 0.4 MG SL tablet   2. How are you currently taking this medication (dosage and times per day)?   3. Are you having a reaction (difficulty breathing--STAT)?   4. What is your medication issue?   Caller stated they do not have the brand for this medication and wants to know if they can dispense the generic version.

## 2022-09-24 NOTE — Telephone Encounter (Signed)
Spoke to Radley at [facility] and advised okay to fill generic for Nitrostat 0.4 mg SL tablets.

## 2022-09-25 ENCOUNTER — Ambulatory Visit (HOSPITAL_COMMUNITY)
Admission: RE | Admit: 2022-09-25 | Discharge: 2022-09-25 | Disposition: A | Discharge status: Home / Self Care | Payer: Medicare Other | Source: Ambulatory Visit | Attending: Gastroenterology | Admitting: Gastroenterology

## 2022-09-25 DIAGNOSIS — E8770 Fluid overload, unspecified: Secondary | ICD-10-CM | POA: Diagnosis not present

## 2022-09-25 DIAGNOSIS — D689 Coagulation defect, unspecified: Secondary | ICD-10-CM | POA: Diagnosis not present

## 2022-09-25 DIAGNOSIS — Z992 Dependence on renal dialysis: Secondary | ICD-10-CM | POA: Diagnosis not present

## 2022-09-25 DIAGNOSIS — E1140 Type 2 diabetes mellitus with diabetic neuropathy, unspecified: Secondary | ICD-10-CM | POA: Diagnosis not present

## 2022-09-25 DIAGNOSIS — N2581 Secondary hyperparathyroidism of renal origin: Secondary | ICD-10-CM | POA: Diagnosis not present

## 2022-09-25 DIAGNOSIS — D649 Anemia, unspecified: Secondary | ICD-10-CM | POA: Diagnosis not present

## 2022-09-25 DIAGNOSIS — N186 End stage renal disease: Secondary | ICD-10-CM | POA: Diagnosis not present

## 2022-09-25 DIAGNOSIS — R7989 Other specified abnormal findings of blood chemistry: Secondary | ICD-10-CM | POA: Diagnosis not present

## 2022-09-25 DIAGNOSIS — Z23 Encounter for immunization: Secondary | ICD-10-CM | POA: Diagnosis not present

## 2022-09-25 DIAGNOSIS — E119 Type 2 diabetes mellitus without complications: Secondary | ICD-10-CM | POA: Diagnosis not present

## 2022-09-25 DIAGNOSIS — D631 Anemia in chronic kidney disease: Secondary | ICD-10-CM | POA: Diagnosis not present

## 2022-09-27 DIAGNOSIS — N186 End stage renal disease: Secondary | ICD-10-CM | POA: Diagnosis not present

## 2022-09-27 DIAGNOSIS — D689 Coagulation defect, unspecified: Secondary | ICD-10-CM | POA: Diagnosis not present

## 2022-09-27 DIAGNOSIS — Z992 Dependence on renal dialysis: Secondary | ICD-10-CM | POA: Diagnosis not present

## 2022-09-27 DIAGNOSIS — D631 Anemia in chronic kidney disease: Secondary | ICD-10-CM | POA: Diagnosis not present

## 2022-09-27 DIAGNOSIS — N2581 Secondary hyperparathyroidism of renal origin: Secondary | ICD-10-CM | POA: Diagnosis not present

## 2022-09-27 DIAGNOSIS — D649 Anemia, unspecified: Secondary | ICD-10-CM | POA: Diagnosis not present

## 2022-09-27 DIAGNOSIS — E8770 Fluid overload, unspecified: Secondary | ICD-10-CM | POA: Diagnosis not present

## 2022-09-27 DIAGNOSIS — Z23 Encounter for immunization: Secondary | ICD-10-CM | POA: Diagnosis not present

## 2022-09-27 DIAGNOSIS — E1140 Type 2 diabetes mellitus with diabetic neuropathy, unspecified: Secondary | ICD-10-CM | POA: Diagnosis not present

## 2022-09-27 DIAGNOSIS — E119 Type 2 diabetes mellitus without complications: Secondary | ICD-10-CM | POA: Diagnosis not present

## 2022-09-28 DIAGNOSIS — Z23 Encounter for immunization: Secondary | ICD-10-CM | POA: Diagnosis not present

## 2022-09-28 DIAGNOSIS — D649 Anemia, unspecified: Secondary | ICD-10-CM | POA: Diagnosis not present

## 2022-09-28 DIAGNOSIS — D689 Coagulation defect, unspecified: Secondary | ICD-10-CM | POA: Diagnosis not present

## 2022-09-28 DIAGNOSIS — Z992 Dependence on renal dialysis: Secondary | ICD-10-CM | POA: Diagnosis not present

## 2022-09-28 DIAGNOSIS — E8770 Fluid overload, unspecified: Secondary | ICD-10-CM | POA: Diagnosis not present

## 2022-09-28 DIAGNOSIS — D631 Anemia in chronic kidney disease: Secondary | ICD-10-CM | POA: Diagnosis not present

## 2022-09-28 DIAGNOSIS — E1140 Type 2 diabetes mellitus with diabetic neuropathy, unspecified: Secondary | ICD-10-CM | POA: Diagnosis not present

## 2022-09-28 DIAGNOSIS — E119 Type 2 diabetes mellitus without complications: Secondary | ICD-10-CM | POA: Diagnosis not present

## 2022-09-28 DIAGNOSIS — N2581 Secondary hyperparathyroidism of renal origin: Secondary | ICD-10-CM | POA: Diagnosis not present

## 2022-09-28 DIAGNOSIS — N186 End stage renal disease: Secondary | ICD-10-CM | POA: Diagnosis not present

## 2022-10-01 ENCOUNTER — Other Ambulatory Visit (INDEPENDENT_AMBULATORY_CARE_PROVIDER_SITE_OTHER): Payer: Self-pay | Admitting: Gastroenterology

## 2022-10-01 DIAGNOSIS — R7989 Other specified abnormal findings of blood chemistry: Secondary | ICD-10-CM

## 2022-10-01 DIAGNOSIS — E8770 Fluid overload, unspecified: Secondary | ICD-10-CM | POA: Diagnosis not present

## 2022-10-01 DIAGNOSIS — E1140 Type 2 diabetes mellitus with diabetic neuropathy, unspecified: Secondary | ICD-10-CM | POA: Diagnosis not present

## 2022-10-01 DIAGNOSIS — N186 End stage renal disease: Secondary | ICD-10-CM | POA: Diagnosis not present

## 2022-10-01 DIAGNOSIS — D631 Anemia in chronic kidney disease: Secondary | ICD-10-CM | POA: Diagnosis not present

## 2022-10-01 DIAGNOSIS — N2581 Secondary hyperparathyroidism of renal origin: Secondary | ICD-10-CM | POA: Diagnosis not present

## 2022-10-01 DIAGNOSIS — Z23 Encounter for immunization: Secondary | ICD-10-CM | POA: Diagnosis not present

## 2022-10-01 DIAGNOSIS — R932 Abnormal findings on diagnostic imaging of liver and biliary tract: Secondary | ICD-10-CM

## 2022-10-01 DIAGNOSIS — D689 Coagulation defect, unspecified: Secondary | ICD-10-CM | POA: Diagnosis not present

## 2022-10-01 DIAGNOSIS — E119 Type 2 diabetes mellitus without complications: Secondary | ICD-10-CM | POA: Diagnosis not present

## 2022-10-01 DIAGNOSIS — Z992 Dependence on renal dialysis: Secondary | ICD-10-CM | POA: Diagnosis not present

## 2022-10-01 DIAGNOSIS — D649 Anemia, unspecified: Secondary | ICD-10-CM | POA: Diagnosis not present

## 2022-10-02 DIAGNOSIS — N2581 Secondary hyperparathyroidism of renal origin: Secondary | ICD-10-CM | POA: Diagnosis not present

## 2022-10-02 DIAGNOSIS — N186 End stage renal disease: Secondary | ICD-10-CM | POA: Diagnosis not present

## 2022-10-02 DIAGNOSIS — M65331 Trigger finger, right middle finger: Secondary | ICD-10-CM | POA: Diagnosis not present

## 2022-10-02 DIAGNOSIS — E119 Type 2 diabetes mellitus without complications: Secondary | ICD-10-CM | POA: Diagnosis not present

## 2022-10-02 DIAGNOSIS — D689 Coagulation defect, unspecified: Secondary | ICD-10-CM | POA: Diagnosis not present

## 2022-10-02 DIAGNOSIS — D631 Anemia in chronic kidney disease: Secondary | ICD-10-CM | POA: Diagnosis not present

## 2022-10-02 DIAGNOSIS — E1140 Type 2 diabetes mellitus with diabetic neuropathy, unspecified: Secondary | ICD-10-CM | POA: Diagnosis not present

## 2022-10-02 DIAGNOSIS — D649 Anemia, unspecified: Secondary | ICD-10-CM | POA: Diagnosis not present

## 2022-10-02 DIAGNOSIS — Z992 Dependence on renal dialysis: Secondary | ICD-10-CM | POA: Diagnosis not present

## 2022-10-02 DIAGNOSIS — E8770 Fluid overload, unspecified: Secondary | ICD-10-CM | POA: Diagnosis not present

## 2022-10-02 DIAGNOSIS — Z23 Encounter for immunization: Secondary | ICD-10-CM | POA: Diagnosis not present

## 2022-10-03 ENCOUNTER — Encounter (INDEPENDENT_AMBULATORY_CARE_PROVIDER_SITE_OTHER): Payer: Self-pay

## 2022-10-04 DIAGNOSIS — E1140 Type 2 diabetes mellitus with diabetic neuropathy, unspecified: Secondary | ICD-10-CM | POA: Diagnosis not present

## 2022-10-04 DIAGNOSIS — D649 Anemia, unspecified: Secondary | ICD-10-CM | POA: Diagnosis not present

## 2022-10-04 DIAGNOSIS — Z992 Dependence on renal dialysis: Secondary | ICD-10-CM | POA: Diagnosis not present

## 2022-10-04 DIAGNOSIS — E119 Type 2 diabetes mellitus without complications: Secondary | ICD-10-CM | POA: Diagnosis not present

## 2022-10-04 DIAGNOSIS — D631 Anemia in chronic kidney disease: Secondary | ICD-10-CM | POA: Diagnosis not present

## 2022-10-04 DIAGNOSIS — N2581 Secondary hyperparathyroidism of renal origin: Secondary | ICD-10-CM | POA: Diagnosis not present

## 2022-10-04 DIAGNOSIS — D689 Coagulation defect, unspecified: Secondary | ICD-10-CM | POA: Diagnosis not present

## 2022-10-04 DIAGNOSIS — E8770 Fluid overload, unspecified: Secondary | ICD-10-CM | POA: Diagnosis not present

## 2022-10-04 DIAGNOSIS — N186 End stage renal disease: Secondary | ICD-10-CM | POA: Diagnosis not present

## 2022-10-04 DIAGNOSIS — Z23 Encounter for immunization: Secondary | ICD-10-CM | POA: Diagnosis not present

## 2022-10-05 DIAGNOSIS — E119 Type 2 diabetes mellitus without complications: Secondary | ICD-10-CM | POA: Diagnosis not present

## 2022-10-05 DIAGNOSIS — D631 Anemia in chronic kidney disease: Secondary | ICD-10-CM | POA: Diagnosis not present

## 2022-10-05 DIAGNOSIS — Z23 Encounter for immunization: Secondary | ICD-10-CM | POA: Diagnosis not present

## 2022-10-05 DIAGNOSIS — N186 End stage renal disease: Secondary | ICD-10-CM | POA: Diagnosis not present

## 2022-10-05 DIAGNOSIS — D649 Anemia, unspecified: Secondary | ICD-10-CM | POA: Diagnosis not present

## 2022-10-05 DIAGNOSIS — Z992 Dependence on renal dialysis: Secondary | ICD-10-CM | POA: Diagnosis not present

## 2022-10-05 DIAGNOSIS — D689 Coagulation defect, unspecified: Secondary | ICD-10-CM | POA: Diagnosis not present

## 2022-10-05 DIAGNOSIS — N2581 Secondary hyperparathyroidism of renal origin: Secondary | ICD-10-CM | POA: Diagnosis not present

## 2022-10-05 DIAGNOSIS — E8770 Fluid overload, unspecified: Secondary | ICD-10-CM | POA: Diagnosis not present

## 2022-10-05 DIAGNOSIS — E1140 Type 2 diabetes mellitus with diabetic neuropathy, unspecified: Secondary | ICD-10-CM | POA: Diagnosis not present

## 2022-10-08 DIAGNOSIS — N186 End stage renal disease: Secondary | ICD-10-CM | POA: Diagnosis not present

## 2022-10-08 DIAGNOSIS — Z1159 Encounter for screening for other viral diseases: Secondary | ICD-10-CM | POA: Diagnosis not present

## 2022-10-08 DIAGNOSIS — D649 Anemia, unspecified: Secondary | ICD-10-CM | POA: Diagnosis not present

## 2022-10-08 DIAGNOSIS — E8770 Fluid overload, unspecified: Secondary | ICD-10-CM | POA: Diagnosis not present

## 2022-10-08 DIAGNOSIS — R932 Abnormal findings on diagnostic imaging of liver and biliary tract: Secondary | ICD-10-CM | POA: Diagnosis not present

## 2022-10-08 DIAGNOSIS — E1140 Type 2 diabetes mellitus with diabetic neuropathy, unspecified: Secondary | ICD-10-CM | POA: Diagnosis not present

## 2022-10-08 DIAGNOSIS — E119 Type 2 diabetes mellitus without complications: Secondary | ICD-10-CM | POA: Diagnosis not present

## 2022-10-08 DIAGNOSIS — Z23 Encounter for immunization: Secondary | ICD-10-CM | POA: Diagnosis not present

## 2022-10-08 DIAGNOSIS — D689 Coagulation defect, unspecified: Secondary | ICD-10-CM | POA: Diagnosis not present

## 2022-10-08 DIAGNOSIS — Z992 Dependence on renal dialysis: Secondary | ICD-10-CM | POA: Diagnosis not present

## 2022-10-08 DIAGNOSIS — D631 Anemia in chronic kidney disease: Secondary | ICD-10-CM | POA: Diagnosis not present

## 2022-10-08 DIAGNOSIS — N2581 Secondary hyperparathyroidism of renal origin: Secondary | ICD-10-CM | POA: Diagnosis not present

## 2022-10-08 DIAGNOSIS — R7989 Other specified abnormal findings of blood chemistry: Secondary | ICD-10-CM | POA: Diagnosis not present

## 2022-10-09 DIAGNOSIS — D649 Anemia, unspecified: Secondary | ICD-10-CM | POA: Diagnosis not present

## 2022-10-09 DIAGNOSIS — D631 Anemia in chronic kidney disease: Secondary | ICD-10-CM | POA: Diagnosis not present

## 2022-10-09 DIAGNOSIS — E119 Type 2 diabetes mellitus without complications: Secondary | ICD-10-CM | POA: Diagnosis not present

## 2022-10-09 DIAGNOSIS — E8770 Fluid overload, unspecified: Secondary | ICD-10-CM | POA: Diagnosis not present

## 2022-10-09 DIAGNOSIS — D689 Coagulation defect, unspecified: Secondary | ICD-10-CM | POA: Diagnosis not present

## 2022-10-09 DIAGNOSIS — N2581 Secondary hyperparathyroidism of renal origin: Secondary | ICD-10-CM | POA: Diagnosis not present

## 2022-10-09 DIAGNOSIS — Z992 Dependence on renal dialysis: Secondary | ICD-10-CM | POA: Diagnosis not present

## 2022-10-09 DIAGNOSIS — N186 End stage renal disease: Secondary | ICD-10-CM | POA: Diagnosis not present

## 2022-10-09 DIAGNOSIS — Z23 Encounter for immunization: Secondary | ICD-10-CM | POA: Diagnosis not present

## 2022-10-09 DIAGNOSIS — E1140 Type 2 diabetes mellitus with diabetic neuropathy, unspecified: Secondary | ICD-10-CM | POA: Diagnosis not present

## 2022-10-09 LAB — HCV INTERPRETATION

## 2022-10-09 LAB — CERULOPLASMIN: Ceruloplasmin: 24 mg/dL (ref 19.0–39.0)

## 2022-10-09 LAB — IRON,TIBC AND FERRITIN PANEL
Ferritin: 350 ng/mL — ABNORMAL HIGH (ref 15–150)
Iron: 73 ug/dL (ref 27–139)

## 2022-10-09 LAB — IGG, IGA, IGM
IgA/Immunoglobulin A, Serum: 220 mg/dL (ref 64–422)
IgG (Immunoglobin G), Serum: 1249 mg/dL (ref 586–1602)
IgM (Immunoglobulin M), Srm: 121 mg/dL (ref 26–217)

## 2022-10-09 LAB — ANTI-SMOOTH MUSCLE ANTIBODY, IGG: Smooth Muscle Ab: 5 Units (ref 0–19)

## 2022-10-10 DIAGNOSIS — D689 Coagulation defect, unspecified: Secondary | ICD-10-CM | POA: Diagnosis not present

## 2022-10-10 DIAGNOSIS — E8770 Fluid overload, unspecified: Secondary | ICD-10-CM | POA: Diagnosis not present

## 2022-10-10 DIAGNOSIS — Z23 Encounter for immunization: Secondary | ICD-10-CM | POA: Diagnosis not present

## 2022-10-10 DIAGNOSIS — E119 Type 2 diabetes mellitus without complications: Secondary | ICD-10-CM | POA: Diagnosis not present

## 2022-10-10 DIAGNOSIS — Z992 Dependence on renal dialysis: Secondary | ICD-10-CM | POA: Diagnosis not present

## 2022-10-10 DIAGNOSIS — E1140 Type 2 diabetes mellitus with diabetic neuropathy, unspecified: Secondary | ICD-10-CM | POA: Diagnosis not present

## 2022-10-10 DIAGNOSIS — D631 Anemia in chronic kidney disease: Secondary | ICD-10-CM | POA: Diagnosis not present

## 2022-10-10 DIAGNOSIS — N2581 Secondary hyperparathyroidism of renal origin: Secondary | ICD-10-CM | POA: Diagnosis not present

## 2022-10-10 DIAGNOSIS — D649 Anemia, unspecified: Secondary | ICD-10-CM | POA: Diagnosis not present

## 2022-10-10 DIAGNOSIS — N186 End stage renal disease: Secondary | ICD-10-CM | POA: Diagnosis not present

## 2022-10-10 LAB — NASH FIBROSURE(R) PLUS
ALPHA 2-MACROGLOBULINS, QN: 336 mg/dL — ABNORMAL HIGH (ref 110–276)
ALT (SGPT) P5P: 60 IU/L — ABNORMAL HIGH (ref 0–40)
AST (SGOT) P5P: 54 IU/L — ABNORMAL HIGH (ref 0–40)
Apolipoprotein A-1: 157 mg/dL (ref 116–209)
Bilirubin, Total: 0.3 mg/dL (ref 0.0–1.2)
Cholesterol, Total: 104 mg/dL (ref 100–199)
Fibrosis Score: 0.34 — ABNORMAL HIGH (ref 0.00–0.21)
GGT: 14 IU/L (ref 0–60)
Glucose: 144 mg/dL — ABNORMAL HIGH (ref 70–99)
Haptoglobin: 126 mg/dL (ref 42–346)
NASH Score: 0.88 — ABNORMAL HIGH (ref 0.00–0.25)
Steatosis Score: 0.56 — ABNORMAL HIGH (ref 0.00–0.40)
Triglycerides: 149 mg/dL (ref 0–149)

## 2022-10-11 ENCOUNTER — Other Ambulatory Visit (INDEPENDENT_AMBULATORY_CARE_PROVIDER_SITE_OTHER): Payer: Self-pay | Admitting: Gastroenterology

## 2022-10-11 DIAGNOSIS — E1140 Type 2 diabetes mellitus with diabetic neuropathy, unspecified: Secondary | ICD-10-CM | POA: Diagnosis not present

## 2022-10-11 DIAGNOSIS — N2581 Secondary hyperparathyroidism of renal origin: Secondary | ICD-10-CM | POA: Diagnosis not present

## 2022-10-11 DIAGNOSIS — Z992 Dependence on renal dialysis: Secondary | ICD-10-CM | POA: Diagnosis not present

## 2022-10-11 DIAGNOSIS — D689 Coagulation defect, unspecified: Secondary | ICD-10-CM | POA: Diagnosis not present

## 2022-10-11 DIAGNOSIS — R768 Other specified abnormal immunological findings in serum: Secondary | ICD-10-CM

## 2022-10-11 DIAGNOSIS — D649 Anemia, unspecified: Secondary | ICD-10-CM | POA: Diagnosis not present

## 2022-10-11 DIAGNOSIS — Z23 Encounter for immunization: Secondary | ICD-10-CM | POA: Diagnosis not present

## 2022-10-11 DIAGNOSIS — E8770 Fluid overload, unspecified: Secondary | ICD-10-CM | POA: Diagnosis not present

## 2022-10-11 DIAGNOSIS — R7989 Other specified abnormal findings of blood chemistry: Secondary | ICD-10-CM | POA: Diagnosis not present

## 2022-10-11 DIAGNOSIS — N186 End stage renal disease: Secondary | ICD-10-CM | POA: Diagnosis not present

## 2022-10-11 DIAGNOSIS — D631 Anemia in chronic kidney disease: Secondary | ICD-10-CM | POA: Diagnosis not present

## 2022-10-11 DIAGNOSIS — E119 Type 2 diabetes mellitus without complications: Secondary | ICD-10-CM | POA: Diagnosis not present

## 2022-10-12 ENCOUNTER — Encounter (HOSPITAL_COMMUNITY)
Admission: RE | Admit: 2022-10-12 | Discharge: 2022-10-12 | Disposition: A | Discharge status: Home / Self Care | Payer: Medicare Other | Source: Ambulatory Visit | Attending: Gastroenterology | Admitting: Gastroenterology

## 2022-10-12 ENCOUNTER — Other Ambulatory Visit: Payer: Self-pay

## 2022-10-12 ENCOUNTER — Encounter (HOSPITAL_COMMUNITY): Payer: Self-pay

## 2022-10-12 VITALS — Ht 62.0 in | Wt 166.9 lb

## 2022-10-12 DIAGNOSIS — Z23 Encounter for immunization: Secondary | ICD-10-CM | POA: Diagnosis not present

## 2022-10-12 DIAGNOSIS — N186 End stage renal disease: Secondary | ICD-10-CM | POA: Diagnosis not present

## 2022-10-12 DIAGNOSIS — N2581 Secondary hyperparathyroidism of renal origin: Secondary | ICD-10-CM | POA: Diagnosis not present

## 2022-10-12 DIAGNOSIS — D689 Coagulation defect, unspecified: Secondary | ICD-10-CM | POA: Diagnosis not present

## 2022-10-12 DIAGNOSIS — E1140 Type 2 diabetes mellitus with diabetic neuropathy, unspecified: Secondary | ICD-10-CM | POA: Diagnosis not present

## 2022-10-12 DIAGNOSIS — E119 Type 2 diabetes mellitus without complications: Secondary | ICD-10-CM | POA: Diagnosis not present

## 2022-10-12 DIAGNOSIS — D649 Anemia, unspecified: Secondary | ICD-10-CM | POA: Diagnosis not present

## 2022-10-12 DIAGNOSIS — E8770 Fluid overload, unspecified: Secondary | ICD-10-CM | POA: Diagnosis not present

## 2022-10-12 DIAGNOSIS — D631 Anemia in chronic kidney disease: Secondary | ICD-10-CM | POA: Diagnosis not present

## 2022-10-12 DIAGNOSIS — Z992 Dependence on renal dialysis: Secondary | ICD-10-CM | POA: Diagnosis not present

## 2022-10-14 DIAGNOSIS — Z992 Dependence on renal dialysis: Secondary | ICD-10-CM | POA: Diagnosis not present

## 2022-10-14 DIAGNOSIS — N186 End stage renal disease: Secondary | ICD-10-CM | POA: Diagnosis not present

## 2022-10-15 DIAGNOSIS — N2581 Secondary hyperparathyroidism of renal origin: Secondary | ICD-10-CM | POA: Diagnosis not present

## 2022-10-15 DIAGNOSIS — N186 End stage renal disease: Secondary | ICD-10-CM | POA: Diagnosis not present

## 2022-10-15 DIAGNOSIS — E8770 Fluid overload, unspecified: Secondary | ICD-10-CM | POA: Diagnosis not present

## 2022-10-15 DIAGNOSIS — D689 Coagulation defect, unspecified: Secondary | ICD-10-CM | POA: Diagnosis not present

## 2022-10-15 DIAGNOSIS — Z23 Encounter for immunization: Secondary | ICD-10-CM | POA: Diagnosis not present

## 2022-10-15 DIAGNOSIS — Z992 Dependence on renal dialysis: Secondary | ICD-10-CM | POA: Diagnosis not present

## 2022-10-15 DIAGNOSIS — E119 Type 2 diabetes mellitus without complications: Secondary | ICD-10-CM | POA: Diagnosis not present

## 2022-10-15 DIAGNOSIS — D631 Anemia in chronic kidney disease: Secondary | ICD-10-CM | POA: Diagnosis not present

## 2022-10-15 DIAGNOSIS — E1140 Type 2 diabetes mellitus with diabetic neuropathy, unspecified: Secondary | ICD-10-CM | POA: Diagnosis not present

## 2022-10-15 DIAGNOSIS — D649 Anemia, unspecified: Secondary | ICD-10-CM | POA: Diagnosis not present

## 2022-10-16 ENCOUNTER — Ambulatory Visit (HOSPITAL_BASED_OUTPATIENT_CLINIC_OR_DEPARTMENT_OTHER): Payer: Medicare Other | Admitting: Anesthesiology

## 2022-10-16 ENCOUNTER — Encounter (HOSPITAL_COMMUNITY): Payer: Self-pay | Admitting: Gastroenterology

## 2022-10-16 ENCOUNTER — Ambulatory Visit (HOSPITAL_COMMUNITY): Payer: Medicare Other | Admitting: Anesthesiology

## 2022-10-16 ENCOUNTER — Encounter (HOSPITAL_COMMUNITY)
Admission: RE | Disposition: A | Discharge status: Home / Self Care | Payer: Self-pay | Source: Ambulatory Visit | Attending: Gastroenterology

## 2022-10-16 ENCOUNTER — Ambulatory Visit (HOSPITAL_COMMUNITY)
Admission: RE | Admit: 2022-10-16 | Discharge: 2022-10-16 | Disposition: A | Discharge status: Home / Self Care | Payer: Medicare Other | Source: Ambulatory Visit | Attending: Gastroenterology | Admitting: Gastroenterology

## 2022-10-16 DIAGNOSIS — R112 Nausea with vomiting, unspecified: Secondary | ICD-10-CM | POA: Insufficient documentation

## 2022-10-16 DIAGNOSIS — R1013 Epigastric pain: Secondary | ICD-10-CM | POA: Diagnosis not present

## 2022-10-16 DIAGNOSIS — F419 Anxiety disorder, unspecified: Secondary | ICD-10-CM | POA: Insufficient documentation

## 2022-10-16 DIAGNOSIS — Z85528 Personal history of other malignant neoplasm of kidney: Secondary | ICD-10-CM | POA: Insufficient documentation

## 2022-10-16 DIAGNOSIS — R110 Nausea: Secondary | ICD-10-CM

## 2022-10-16 DIAGNOSIS — N2581 Secondary hyperparathyroidism of renal origin: Secondary | ICD-10-CM | POA: Diagnosis not present

## 2022-10-16 DIAGNOSIS — M797 Fibromyalgia: Secondary | ICD-10-CM | POA: Insufficient documentation

## 2022-10-16 DIAGNOSIS — F32A Depression, unspecified: Secondary | ICD-10-CM | POA: Diagnosis not present

## 2022-10-16 DIAGNOSIS — Z9884 Bariatric surgery status: Secondary | ICD-10-CM | POA: Insufficient documentation

## 2022-10-16 DIAGNOSIS — M109 Gout, unspecified: Secondary | ICD-10-CM | POA: Diagnosis not present

## 2022-10-16 DIAGNOSIS — R7989 Other specified abnormal findings of blood chemistry: Secondary | ICD-10-CM | POA: Insufficient documentation

## 2022-10-16 DIAGNOSIS — R1012 Left upper quadrant pain: Secondary | ICD-10-CM | POA: Diagnosis not present

## 2022-10-16 DIAGNOSIS — N186 End stage renal disease: Secondary | ICD-10-CM | POA: Diagnosis not present

## 2022-10-16 DIAGNOSIS — I10 Essential (primary) hypertension: Secondary | ICD-10-CM

## 2022-10-16 DIAGNOSIS — E782 Mixed hyperlipidemia: Secondary | ICD-10-CM | POA: Diagnosis not present

## 2022-10-16 DIAGNOSIS — E119 Type 2 diabetes mellitus without complications: Secondary | ICD-10-CM | POA: Insufficient documentation

## 2022-10-16 DIAGNOSIS — E8770 Fluid overload, unspecified: Secondary | ICD-10-CM | POA: Diagnosis not present

## 2022-10-16 DIAGNOSIS — D649 Anemia, unspecified: Secondary | ICD-10-CM | POA: Diagnosis not present

## 2022-10-16 DIAGNOSIS — E785 Hyperlipidemia, unspecified: Secondary | ICD-10-CM | POA: Diagnosis not present

## 2022-10-16 DIAGNOSIS — I2510 Atherosclerotic heart disease of native coronary artery without angina pectoris: Secondary | ICD-10-CM

## 2022-10-16 DIAGNOSIS — K219 Gastro-esophageal reflux disease without esophagitis: Secondary | ICD-10-CM | POA: Diagnosis not present

## 2022-10-16 DIAGNOSIS — E1140 Type 2 diabetes mellitus with diabetic neuropathy, unspecified: Secondary | ICD-10-CM | POA: Diagnosis not present

## 2022-10-16 DIAGNOSIS — R569 Unspecified convulsions: Secondary | ICD-10-CM | POA: Diagnosis not present

## 2022-10-16 DIAGNOSIS — Z992 Dependence on renal dialysis: Secondary | ICD-10-CM | POA: Diagnosis not present

## 2022-10-16 DIAGNOSIS — D631 Anemia in chronic kidney disease: Secondary | ICD-10-CM | POA: Diagnosis not present

## 2022-10-16 DIAGNOSIS — D689 Coagulation defect, unspecified: Secondary | ICD-10-CM | POA: Diagnosis not present

## 2022-10-16 DIAGNOSIS — Z23 Encounter for immunization: Secondary | ICD-10-CM | POA: Diagnosis not present

## 2022-10-16 HISTORY — PX: BIOPSY: SHX5522

## 2022-10-16 HISTORY — PX: ESOPHAGOGASTRODUODENOSCOPY (EGD) WITH PROPOFOL: SHX5813

## 2022-10-16 LAB — POCT I-STAT, CHEM 8
BUN: 42 mg/dL — ABNORMAL HIGH (ref 8–23)
Calcium, Ion: 1.22 mmol/L (ref 1.15–1.40)
Chloride: 101 mmol/L (ref 98–111)
Creatinine, Ser: 6 mg/dL — ABNORMAL HIGH (ref 0.44–1.00)
Glucose, Bld: 154 mg/dL — ABNORMAL HIGH (ref 70–99)
HCT: 41 % (ref 36.0–46.0)
Hemoglobin: 13.9 g/dL (ref 12.0–15.0)
Potassium: 3.6 mmol/L (ref 3.5–5.1)
Sodium: 136 mmol/L (ref 135–145)
TCO2: 21 mmol/L — ABNORMAL LOW (ref 22–32)

## 2022-10-16 SURGERY — ESOPHAGOGASTRODUODENOSCOPY (EGD) WITH PROPOFOL
Anesthesia: General

## 2022-10-16 MED ORDER — EPHEDRINE 5 MG/ML INJ
INTRAVENOUS | Status: AC
  Filled 2022-10-16: qty 5

## 2022-10-16 MED ORDER — LIDOCAINE HCL (CARDIAC) PF 100 MG/5ML IV SOSY
PREFILLED_SYRINGE | INTRAVENOUS | Status: DC | PRN
  Administered 2022-10-16: 100 mg via INTRAVENOUS

## 2022-10-16 MED ORDER — PHENYLEPHRINE 80 MCG/ML (10ML) SYRINGE FOR IV PUSH (FOR BLOOD PRESSURE SUPPORT)
PREFILLED_SYRINGE | INTRAVENOUS | Status: DC | PRN
Start: 2022-10-16 — End: 2022-10-16
  Administered 2022-10-16: 160 ug via INTRAVENOUS

## 2022-10-16 MED ORDER — PHENYLEPHRINE 80 MCG/ML (10ML) SYRINGE FOR IV PUSH (FOR BLOOD PRESSURE SUPPORT)
PREFILLED_SYRINGE | INTRAVENOUS | Status: AC
  Filled 2022-10-16: qty 10

## 2022-10-16 MED ORDER — PROPOFOL 10 MG/ML IV BOLUS
INTRAVENOUS | Status: DC | PRN
  Administered 2022-10-16: 80 mg via INTRAVENOUS

## 2022-10-16 MED ORDER — PROPOFOL 500 MG/50ML IV EMUL
INTRAVENOUS | Status: DC | PRN
  Administered 2022-10-16: 150 ug/kg/min via INTRAVENOUS

## 2022-10-16 MED ORDER — LACTATED RINGERS IV SOLN: INTRAVENOUS | Status: DC

## 2022-10-16 MED ORDER — LIDOCAINE HCL (PF) 2 % IJ SOLN
INTRAMUSCULAR | Status: AC
  Filled 2022-10-16: qty 5

## 2022-10-16 MED ORDER — EPHEDRINE SULFATE-NACL 50-0.9 MG/10ML-% IV SOSY
PREFILLED_SYRINGE | INTRAVENOUS | Status: DC | PRN
Start: 2022-10-16 — End: 2022-10-16
  Administered 2022-10-16 (×2): 10 mg via INTRAVENOUS

## 2022-10-16 MED ORDER — SODIUM CHLORIDE 0.9 % IV SOLN: INTRAVENOUS | Status: DC

## 2022-10-16 MED ORDER — PROPOFOL 1000 MG/100ML IV EMUL
INTRAVENOUS | Status: AC
  Filled 2022-10-16: qty 100

## 2022-10-16 NOTE — Op Note (Signed)
[facility] Patient Name: Margaret Huerta Procedure Date: 10/16/2022 9:17 AM MRN: [id number] Date of Birth: 1945/05/25 Attending MD: Katrinka Blazing , , [id number] CSN: [id number] Age: 77 Admit Type: Outpatient Procedure:                Upper GI endoscopy Indications:              Epigastric abdominal pain Providers:                Katrinka Blazing, Angelica Ran, Lennice Sites                            Technician, Technician Referring MD:              Medicines:                Monitored Anesthesia Care Complications:            No immediate complications. Estimated Blood Loss:     Estimated blood loss: none. Procedure:                Pre-Anesthesia Assessment:                           - Prior to the procedure, a History and Physical                            was performed, and patient medications, allergies                            and sensitivities were reviewed. The patient's                            tolerance of previous anesthesia was reviewed.                           - The risks and benefits of the procedure and the                            sedation options and risks were discussed with the                            patient. All questions were answered and informed                            consent was obtained.                           - ASA Grade Assessment: II - A patient with mild                            systemic disease.                           After obtaining informed consent, the endoscope was                            passed under direct vision. Throughout the  procedure, the patient's blood pressure, pulse, and                            oxygen saturations were monitored continuously. The                            [id number]-[id number] ([id number]) scope was introduced through the                            mouth, and advanced to the jejunum. The upper GI                            endoscopy was accomplished without difficulty. The                             patient tolerated the procedure well. Scope In: 9:37:44 AM Scope Out: 9:43:36 AM Total Procedure Duration: 0 hours 5 minutes 52 seconds  Findings:      The examined esophagus was normal.      Evidence of a gastric bypass was found. A gastric pouch with a 5 cm       length from the GE junction to the gastrojejunal anastomosis was found       containing pill debris. The staple line appeared intact. The       gastrojejunal anastomosis was characterized by healthy appearing mucosa.       This was traversed. The pouch-to-jejunum limb was characterized by       healthy appearing mucosa. The jejunojejunal anastomosis was       characterized by healthy appearing mucosa. Biopsies were taken with a       cold forceps for Helicobacter pylori testing.      The examined jejunum (both limbs) were normal. Impression:               - Normal esophagus.                           - Gastric bypass with a pouch 5 cm in length and                            intact staple line. Gastrojejunal anastomosis                            characterized by healthy appearing mucosa. Biopsied.                           - Normal examined jejunum. Moderate Sedation:      Per Anesthesia Care Recommendation:           - Discharge patient to home (ambulatory).                           - Resume previous diet.                           - Await pathology results.                           -  If normal biopsies, will prescribe Levsin for                            pain control. Procedure Code(s):        --- Professional ---                           [phone], Esophagogastroduodenoscopy, flexible,                            transoral; with biopsy, single or multiple Diagnosis Code(s):        --- Professional ---                           Z98.84, Bariatric surgery status                           R10.13, Epigastric pain CPT copyright 2022 American Medical Association. All rights reserved. The codes documented in  this report are preliminary and upon coder review may  be revised to meet current compliance requirements. Katrinka Blazing, MD Katrinka Blazing,  10/16/2022 9:55:21 AM This report has been signed electronically. Number of Addenda: 0

## 2022-10-16 NOTE — Discharge Instructions (Signed)
You are being discharged to home.  Resume your previous diet.  We are waiting for your pathology results.  If normal biopsies, will prescribe Levsin for pain control.

## 2022-10-16 NOTE — Interval H&P Note (Signed)
History and Physical Interval Note:  10/16/2022 9:25 AM  Margaret Huerta  has presented today for surgery, with the diagnosis of LUQ pain, nausea.  The various methods of treatment have been discussed with the patient and family. After consideration of risks, benefits and other options for treatment, the patient has consented to  Procedure(s) with comments: ESOPHAGOGASTRODUODENOSCOPY (EGD) WITH PROPOFOL (N/A) - 9:30am;asa 3 as a surgical intervention.  The patient's history has been reviewed, patient examined, no change in status, stable for surgery.  I have reviewed the patient's chart and labs.  Questions were answered to the patient's satisfaction.     Katrinka Blazing Mayorga

## 2022-10-16 NOTE — Anesthesia Postprocedure Evaluation (Signed)
Anesthesia Post Note  Patient: Margaret Huerta  Procedure(s) Performed: ESOPHAGOGASTRODUODENOSCOPY (EGD) WITH PROPOFOL BIOPSY  Patient location during evaluation: Phase II Anesthesia Type: General Level of consciousness: awake and alert and oriented Pain management: pain level controlled Vital Signs Assessment: post-procedure vital signs reviewed and stable Respiratory status: spontaneous breathing, nonlabored ventilation and respiratory function stable Cardiovascular status: blood pressure returned to baseline and stable Postop Assessment: no apparent nausea or vomiting Anesthetic complications: no  No notable events documented.   Last Vitals:  Vitals:   10/16/22 0745 10/16/22 0951  BP: 125/70 (!) 164/63  Pulse: (!) 47 (!) 50  Resp: 12 12  Temp: 36.6 C (!) 36.4 C  SpO2: 99% 97%    Last Pain:  Vitals:   10/16/22 0951  TempSrc: Oral  PainSc: 0-No pain                 Willoughby Doell C Mary Hockey

## 2022-10-16 NOTE — Anesthesia Preprocedure Evaluation (Signed)
Anesthesia Evaluation  Patient identified by MRN, date of birth, ID band Patient awake    Reviewed: Allergy & Precautions, H&P , NPO status , Patient's Chart, lab work & pertinent test results, reviewed documented beta blocker date and time   History of Anesthesia Complications Negative for: history of anesthetic complications  Airway Mallampati: II  TM Distance: >3 FB Neck ROM: Full    Dental  (+) Dental Advisory Given, Poor Dentition, Chipped, Missing, Loose,  Very loose front upper teeth, explained to the patient that she may lose her teeth and risk of aspiration or go into stomach was explained :   Pulmonary sleep apnea , pneumonia   Pulmonary exam normal breath sounds clear to auscultation       Cardiovascular Exercise Tolerance: Poor hypertension, Pt. on medications and Pt. on home beta blockers + CAD  Normal cardiovascular exam Rhythm:Regular Rate:Normal  1. Left ventricular ejection fraction, by estimation, is 60 to 65%. The  left ventricle has normal function. The left ventricle has no regional  wall motion abnormalities. There is mild left ventricular hypertrophy.  Left ventricular diastolic parameters  are consistent with Grade I diastolic dysfunction (impaired relaxation).   2. Right ventricular systolic function is normal. The right ventricular  size is normal. There is mildly elevated pulmonary artery systolic  pressure.   3. Left atrial size was severely dilated.   4. Right atrial size was mildly dilated.   5. The mitral valve is normal in structure. Mild mitral valve  regurgitation. No evidence of mitral stenosis.   6. The aortic valve is tricuspid. Aortic valve regurgitation is not  visualized. No aortic stenosis is present.   7. The inferior vena cava is normal in size with greater than 50%  respiratory variability, suggesting right atrial pressure of 3 mmHg.   8. Left to right atrial shunting noted by color  Doppler, probable PFO     Neuro/Psych  Headaches, Seizures -,  PSYCHIATRIC DISORDERS Anxiety Depression     Neuromuscular disease    GI/Hepatic Neg liver ROS, hiatal hernia,GERD  Poorly Controlled,,  Endo/Other  diabetes    Renal/GU Renal disease  negative genitourinary   Musculoskeletal  (+)  Fibromyalgia -  Abdominal   Peds negative pediatric ROS (+)  Hematology  (+) Blood dyscrasia, anemia   Anesthesia Other Findings   Reproductive/Obstetrics negative OB ROS                             Anesthesia Physical Anesthesia Plan  ASA: 3  Anesthesia Plan: General   Post-op Pain Management: Minimal or no pain anticipated   Induction: Intravenous  PONV Risk Score and Plan: 1 and Propofol infusion  Airway Management Planned: Nasal Cannula and Natural Airway  Additional Equipment:   Intra-op Plan:   Post-operative Plan:   Informed Consent: I have reviewed the patients History and Physical, chart, labs and discussed the procedure including the risks, benefits and alternatives for the proposed anesthesia with the patient or authorized representative who has indicated his/her understanding and acceptance.     Dental advisory given  Plan Discussed with: CRNA and Surgeon  Anesthesia Plan Comments:         Anesthesia Quick Evaluation

## 2022-10-16 NOTE — Transfer of Care (Addendum)
Immediate Anesthesia Transfer of Care Note  Patient: Margaret Huerta  Procedure(s) Performed: ESOPHAGOGASTRODUODENOSCOPY (EGD) WITH PROPOFOL BIOPSY  Patient Location: Short Stay  Anesthesia Type:General  Level of Consciousness: drowsy and patient cooperative  Airway & Oxygen Therapy: Patient Spontanous Breathing and Patient connected to nasal cannula oxygen  Post-op Assessment: Report given to RN and Post -op Vital signs reviewed and stable  Post vital signs: Reviewed and stable  Last Vitals:  Vitals Value Taken Time  BP 164/63 10/16/22   0951  Temp 36.4 10/16/22   0951  Pulse 50 10/16/22   0951  Resp 12 10/16/22   0951  SpO2 97% 10/16/22   0951    Last Pain:  Vitals:   10/16/22 0930  TempSrc:   PainSc: 1          Complications: No notable events documented.

## 2022-10-17 DIAGNOSIS — D689 Coagulation defect, unspecified: Secondary | ICD-10-CM | POA: Diagnosis not present

## 2022-10-17 DIAGNOSIS — E119 Type 2 diabetes mellitus without complications: Secondary | ICD-10-CM | POA: Diagnosis not present

## 2022-10-17 DIAGNOSIS — E1140 Type 2 diabetes mellitus with diabetic neuropathy, unspecified: Secondary | ICD-10-CM | POA: Diagnosis not present

## 2022-10-17 DIAGNOSIS — Z23 Encounter for immunization: Secondary | ICD-10-CM | POA: Diagnosis not present

## 2022-10-17 DIAGNOSIS — N186 End stage renal disease: Secondary | ICD-10-CM | POA: Diagnosis not present

## 2022-10-17 DIAGNOSIS — N2581 Secondary hyperparathyroidism of renal origin: Secondary | ICD-10-CM | POA: Diagnosis not present

## 2022-10-17 DIAGNOSIS — D649 Anemia, unspecified: Secondary | ICD-10-CM | POA: Diagnosis not present

## 2022-10-17 DIAGNOSIS — E8770 Fluid overload, unspecified: Secondary | ICD-10-CM | POA: Diagnosis not present

## 2022-10-17 DIAGNOSIS — D631 Anemia in chronic kidney disease: Secondary | ICD-10-CM | POA: Diagnosis not present

## 2022-10-17 DIAGNOSIS — Z992 Dependence on renal dialysis: Secondary | ICD-10-CM | POA: Diagnosis not present

## 2022-10-18 ENCOUNTER — Other Ambulatory Visit (INDEPENDENT_AMBULATORY_CARE_PROVIDER_SITE_OTHER): Payer: Self-pay | Admitting: Gastroenterology

## 2022-10-18 DIAGNOSIS — N2581 Secondary hyperparathyroidism of renal origin: Secondary | ICD-10-CM | POA: Diagnosis not present

## 2022-10-18 DIAGNOSIS — D689 Coagulation defect, unspecified: Secondary | ICD-10-CM | POA: Diagnosis not present

## 2022-10-18 DIAGNOSIS — E8770 Fluid overload, unspecified: Secondary | ICD-10-CM | POA: Diagnosis not present

## 2022-10-18 DIAGNOSIS — R109 Unspecified abdominal pain: Secondary | ICD-10-CM

## 2022-10-18 DIAGNOSIS — Z992 Dependence on renal dialysis: Secondary | ICD-10-CM | POA: Diagnosis not present

## 2022-10-18 DIAGNOSIS — E119 Type 2 diabetes mellitus without complications: Secondary | ICD-10-CM | POA: Diagnosis not present

## 2022-10-18 DIAGNOSIS — Z23 Encounter for immunization: Secondary | ICD-10-CM | POA: Diagnosis not present

## 2022-10-18 DIAGNOSIS — D649 Anemia, unspecified: Secondary | ICD-10-CM | POA: Diagnosis not present

## 2022-10-18 DIAGNOSIS — D631 Anemia in chronic kidney disease: Secondary | ICD-10-CM | POA: Diagnosis not present

## 2022-10-18 DIAGNOSIS — E1140 Type 2 diabetes mellitus with diabetic neuropathy, unspecified: Secondary | ICD-10-CM | POA: Diagnosis not present

## 2022-10-18 DIAGNOSIS — N186 End stage renal disease: Secondary | ICD-10-CM | POA: Diagnosis not present

## 2022-10-18 LAB — SURGICAL PATHOLOGY

## 2022-10-18 MED ORDER — HYOSCYAMINE SULFATE 0.125 MG PO TABS: 0.1250 mg | ORAL_TABLET | Freq: Three times a day (TID) | ORAL | 1 refills | Status: AC | PRN

## 2022-10-19 ENCOUNTER — Encounter (INDEPENDENT_AMBULATORY_CARE_PROVIDER_SITE_OTHER): Payer: Self-pay | Admitting: *Deleted

## 2022-10-19 ENCOUNTER — Other Ambulatory Visit: Payer: Self-pay | Admitting: Oncology

## 2022-10-19 DIAGNOSIS — E1140 Type 2 diabetes mellitus with diabetic neuropathy, unspecified: Secondary | ICD-10-CM | POA: Diagnosis not present

## 2022-10-19 DIAGNOSIS — D631 Anemia in chronic kidney disease: Secondary | ICD-10-CM | POA: Diagnosis not present

## 2022-10-19 DIAGNOSIS — E119 Type 2 diabetes mellitus without complications: Secondary | ICD-10-CM | POA: Diagnosis not present

## 2022-10-19 DIAGNOSIS — D649 Anemia, unspecified: Secondary | ICD-10-CM | POA: Diagnosis not present

## 2022-10-19 DIAGNOSIS — N2581 Secondary hyperparathyroidism of renal origin: Secondary | ICD-10-CM | POA: Diagnosis not present

## 2022-10-19 DIAGNOSIS — Z992 Dependence on renal dialysis: Secondary | ICD-10-CM | POA: Diagnosis not present

## 2022-10-19 DIAGNOSIS — E8770 Fluid overload, unspecified: Secondary | ICD-10-CM | POA: Diagnosis not present

## 2022-10-19 DIAGNOSIS — Z006 Encounter for examination for normal comparison and control in clinical research program: Secondary | ICD-10-CM

## 2022-10-19 DIAGNOSIS — N186 End stage renal disease: Secondary | ICD-10-CM | POA: Diagnosis not present

## 2022-10-19 DIAGNOSIS — D689 Coagulation defect, unspecified: Secondary | ICD-10-CM | POA: Diagnosis not present

## 2022-10-19 DIAGNOSIS — Z23 Encounter for immunization: Secondary | ICD-10-CM | POA: Diagnosis not present

## 2022-10-22 DIAGNOSIS — D649 Anemia, unspecified: Secondary | ICD-10-CM | POA: Diagnosis not present

## 2022-10-22 DIAGNOSIS — E119 Type 2 diabetes mellitus without complications: Secondary | ICD-10-CM | POA: Diagnosis not present

## 2022-10-22 DIAGNOSIS — N186 End stage renal disease: Secondary | ICD-10-CM | POA: Diagnosis not present

## 2022-10-22 DIAGNOSIS — Z23 Encounter for immunization: Secondary | ICD-10-CM | POA: Diagnosis not present

## 2022-10-22 DIAGNOSIS — Z992 Dependence on renal dialysis: Secondary | ICD-10-CM | POA: Diagnosis not present

## 2022-10-22 DIAGNOSIS — E1140 Type 2 diabetes mellitus with diabetic neuropathy, unspecified: Secondary | ICD-10-CM | POA: Diagnosis not present

## 2022-10-22 DIAGNOSIS — D631 Anemia in chronic kidney disease: Secondary | ICD-10-CM | POA: Diagnosis not present

## 2022-10-22 DIAGNOSIS — E8770 Fluid overload, unspecified: Secondary | ICD-10-CM | POA: Diagnosis not present

## 2022-10-22 DIAGNOSIS — D689 Coagulation defect, unspecified: Secondary | ICD-10-CM | POA: Diagnosis not present

## 2022-10-22 DIAGNOSIS — N2581 Secondary hyperparathyroidism of renal origin: Secondary | ICD-10-CM | POA: Diagnosis not present

## 2022-10-23 DIAGNOSIS — D649 Anemia, unspecified: Secondary | ICD-10-CM | POA: Diagnosis not present

## 2022-10-23 DIAGNOSIS — E119 Type 2 diabetes mellitus without complications: Secondary | ICD-10-CM | POA: Diagnosis not present

## 2022-10-23 DIAGNOSIS — Z992 Dependence on renal dialysis: Secondary | ICD-10-CM | POA: Diagnosis not present

## 2022-10-23 DIAGNOSIS — Z23 Encounter for immunization: Secondary | ICD-10-CM | POA: Diagnosis not present

## 2022-10-23 DIAGNOSIS — D689 Coagulation defect, unspecified: Secondary | ICD-10-CM | POA: Diagnosis not present

## 2022-10-23 DIAGNOSIS — E8770 Fluid overload, unspecified: Secondary | ICD-10-CM | POA: Diagnosis not present

## 2022-10-23 DIAGNOSIS — D631 Anemia in chronic kidney disease: Secondary | ICD-10-CM | POA: Diagnosis not present

## 2022-10-23 DIAGNOSIS — E1140 Type 2 diabetes mellitus with diabetic neuropathy, unspecified: Secondary | ICD-10-CM | POA: Diagnosis not present

## 2022-10-23 DIAGNOSIS — N2581 Secondary hyperparathyroidism of renal origin: Secondary | ICD-10-CM | POA: Diagnosis not present

## 2022-10-23 DIAGNOSIS — N186 End stage renal disease: Secondary | ICD-10-CM | POA: Diagnosis not present

## 2022-10-23 LAB — HEMOCHROMATOSIS DNA-PCR(C282Y,H63D)

## 2022-10-24 ENCOUNTER — Encounter (HOSPITAL_COMMUNITY): Payer: Self-pay | Admitting: Gastroenterology

## 2022-10-25 ENCOUNTER — Other Ambulatory Visit (INDEPENDENT_AMBULATORY_CARE_PROVIDER_SITE_OTHER): Payer: Self-pay | Admitting: *Deleted

## 2022-10-25 DIAGNOSIS — D631 Anemia in chronic kidney disease: Secondary | ICD-10-CM | POA: Diagnosis not present

## 2022-10-25 DIAGNOSIS — N2581 Secondary hyperparathyroidism of renal origin: Secondary | ICD-10-CM | POA: Diagnosis not present

## 2022-10-25 DIAGNOSIS — E1140 Type 2 diabetes mellitus with diabetic neuropathy, unspecified: Secondary | ICD-10-CM | POA: Diagnosis not present

## 2022-10-25 DIAGNOSIS — E8770 Fluid overload, unspecified: Secondary | ICD-10-CM | POA: Diagnosis not present

## 2022-10-25 DIAGNOSIS — Z992 Dependence on renal dialysis: Secondary | ICD-10-CM | POA: Diagnosis not present

## 2022-10-25 DIAGNOSIS — N186 End stage renal disease: Secondary | ICD-10-CM | POA: Diagnosis not present

## 2022-10-25 DIAGNOSIS — D689 Coagulation defect, unspecified: Secondary | ICD-10-CM | POA: Diagnosis not present

## 2022-10-25 DIAGNOSIS — D649 Anemia, unspecified: Secondary | ICD-10-CM | POA: Diagnosis not present

## 2022-10-25 DIAGNOSIS — E119 Type 2 diabetes mellitus without complications: Secondary | ICD-10-CM | POA: Diagnosis not present

## 2022-10-25 DIAGNOSIS — Z23 Encounter for immunization: Secondary | ICD-10-CM | POA: Diagnosis not present

## 2022-10-27 DIAGNOSIS — D649 Anemia, unspecified: Secondary | ICD-10-CM | POA: Diagnosis not present

## 2022-10-27 DIAGNOSIS — E119 Type 2 diabetes mellitus without complications: Secondary | ICD-10-CM | POA: Diagnosis not present

## 2022-10-27 DIAGNOSIS — D631 Anemia in chronic kidney disease: Secondary | ICD-10-CM | POA: Diagnosis not present

## 2022-10-27 DIAGNOSIS — N186 End stage renal disease: Secondary | ICD-10-CM | POA: Diagnosis not present

## 2022-10-27 DIAGNOSIS — Z23 Encounter for immunization: Secondary | ICD-10-CM | POA: Diagnosis not present

## 2022-10-27 DIAGNOSIS — E8770 Fluid overload, unspecified: Secondary | ICD-10-CM | POA: Diagnosis not present

## 2022-10-27 DIAGNOSIS — E1140 Type 2 diabetes mellitus with diabetic neuropathy, unspecified: Secondary | ICD-10-CM | POA: Diagnosis not present

## 2022-10-27 DIAGNOSIS — Z992 Dependence on renal dialysis: Secondary | ICD-10-CM | POA: Diagnosis not present

## 2022-10-27 DIAGNOSIS — N2581 Secondary hyperparathyroidism of renal origin: Secondary | ICD-10-CM | POA: Diagnosis not present

## 2022-10-27 DIAGNOSIS — D689 Coagulation defect, unspecified: Secondary | ICD-10-CM | POA: Diagnosis not present

## 2022-10-29 DIAGNOSIS — Z23 Encounter for immunization: Secondary | ICD-10-CM | POA: Diagnosis not present

## 2022-10-29 DIAGNOSIS — E8770 Fluid overload, unspecified: Secondary | ICD-10-CM | POA: Diagnosis not present

## 2022-10-29 DIAGNOSIS — N186 End stage renal disease: Secondary | ICD-10-CM | POA: Diagnosis not present

## 2022-10-29 DIAGNOSIS — D689 Coagulation defect, unspecified: Secondary | ICD-10-CM | POA: Diagnosis not present

## 2022-10-29 DIAGNOSIS — D631 Anemia in chronic kidney disease: Secondary | ICD-10-CM | POA: Diagnosis not present

## 2022-10-29 DIAGNOSIS — E1140 Type 2 diabetes mellitus with diabetic neuropathy, unspecified: Secondary | ICD-10-CM | POA: Diagnosis not present

## 2022-10-29 DIAGNOSIS — E119 Type 2 diabetes mellitus without complications: Secondary | ICD-10-CM | POA: Diagnosis not present

## 2022-10-29 DIAGNOSIS — D649 Anemia, unspecified: Secondary | ICD-10-CM | POA: Diagnosis not present

## 2022-10-29 DIAGNOSIS — Z992 Dependence on renal dialysis: Secondary | ICD-10-CM | POA: Diagnosis not present

## 2022-10-29 DIAGNOSIS — N2581 Secondary hyperparathyroidism of renal origin: Secondary | ICD-10-CM | POA: Diagnosis not present

## 2022-10-30 ENCOUNTER — Other Ambulatory Visit: Payer: Self-pay | Admitting: Radiology

## 2022-10-30 DIAGNOSIS — E8770 Fluid overload, unspecified: Secondary | ICD-10-CM | POA: Diagnosis not present

## 2022-10-30 DIAGNOSIS — Z23 Encounter for immunization: Secondary | ICD-10-CM | POA: Diagnosis not present

## 2022-10-30 DIAGNOSIS — R7989 Other specified abnormal findings of blood chemistry: Secondary | ICD-10-CM

## 2022-10-30 DIAGNOSIS — N186 End stage renal disease: Secondary | ICD-10-CM | POA: Diagnosis not present

## 2022-10-30 DIAGNOSIS — D689 Coagulation defect, unspecified: Secondary | ICD-10-CM | POA: Diagnosis not present

## 2022-10-30 DIAGNOSIS — D649 Anemia, unspecified: Secondary | ICD-10-CM | POA: Diagnosis not present

## 2022-10-30 DIAGNOSIS — D631 Anemia in chronic kidney disease: Secondary | ICD-10-CM | POA: Diagnosis not present

## 2022-10-30 DIAGNOSIS — E1140 Type 2 diabetes mellitus with diabetic neuropathy, unspecified: Secondary | ICD-10-CM | POA: Diagnosis not present

## 2022-10-30 DIAGNOSIS — E119 Type 2 diabetes mellitus without complications: Secondary | ICD-10-CM | POA: Diagnosis not present

## 2022-10-30 DIAGNOSIS — Z992 Dependence on renal dialysis: Secondary | ICD-10-CM | POA: Diagnosis not present

## 2022-10-30 DIAGNOSIS — N2581 Secondary hyperparathyroidism of renal origin: Secondary | ICD-10-CM | POA: Diagnosis not present

## 2022-10-31 ENCOUNTER — Ambulatory Visit (HOSPITAL_COMMUNITY)
Admission: RE | Admit: 2022-10-31 | Discharge: 2022-10-31 | Disposition: A | Discharge status: Home / Self Care | Payer: Medicare Other | Source: Ambulatory Visit | Attending: Gastroenterology | Admitting: Gastroenterology

## 2022-10-31 DIAGNOSIS — R7989 Other specified abnormal findings of blood chemistry: Secondary | ICD-10-CM

## 2022-10-31 MED ORDER — SODIUM CHLORIDE 0.9 % IV SOLN: INTRAVENOUS | Status: DC

## 2022-10-31 NOTE — Progress Notes (Signed)
   IR Note  Pt scheduled for random liver biopsy today LD ASA 81 was yesterday  Must be off 5 Days to safely perform bx - Rad protocol  Pt made aware She is agreeable to reschedule  IR scheduler will reschedule pt asap

## 2022-11-01 DIAGNOSIS — D649 Anemia, unspecified: Secondary | ICD-10-CM | POA: Diagnosis not present

## 2022-11-01 DIAGNOSIS — E119 Type 2 diabetes mellitus without complications: Secondary | ICD-10-CM | POA: Diagnosis not present

## 2022-11-01 DIAGNOSIS — E1140 Type 2 diabetes mellitus with diabetic neuropathy, unspecified: Secondary | ICD-10-CM | POA: Diagnosis not present

## 2022-11-01 DIAGNOSIS — D631 Anemia in chronic kidney disease: Secondary | ICD-10-CM | POA: Diagnosis not present

## 2022-11-01 DIAGNOSIS — Z23 Encounter for immunization: Secondary | ICD-10-CM | POA: Diagnosis not present

## 2022-11-01 DIAGNOSIS — N186 End stage renal disease: Secondary | ICD-10-CM | POA: Diagnosis not present

## 2022-11-01 DIAGNOSIS — D689 Coagulation defect, unspecified: Secondary | ICD-10-CM | POA: Diagnosis not present

## 2022-11-01 DIAGNOSIS — N2581 Secondary hyperparathyroidism of renal origin: Secondary | ICD-10-CM | POA: Diagnosis not present

## 2022-11-01 DIAGNOSIS — E8770 Fluid overload, unspecified: Secondary | ICD-10-CM | POA: Diagnosis not present

## 2022-11-01 DIAGNOSIS — Z992 Dependence on renal dialysis: Secondary | ICD-10-CM | POA: Diagnosis not present

## 2022-11-02 DIAGNOSIS — N186 End stage renal disease: Secondary | ICD-10-CM | POA: Diagnosis not present

## 2022-11-02 DIAGNOSIS — Z992 Dependence on renal dialysis: Secondary | ICD-10-CM | POA: Diagnosis not present

## 2022-11-02 DIAGNOSIS — Z23 Encounter for immunization: Secondary | ICD-10-CM | POA: Diagnosis not present

## 2022-11-02 DIAGNOSIS — E8770 Fluid overload, unspecified: Secondary | ICD-10-CM | POA: Diagnosis not present

## 2022-11-02 DIAGNOSIS — D631 Anemia in chronic kidney disease: Secondary | ICD-10-CM | POA: Diagnosis not present

## 2022-11-02 DIAGNOSIS — D689 Coagulation defect, unspecified: Secondary | ICD-10-CM | POA: Diagnosis not present

## 2022-11-02 DIAGNOSIS — E119 Type 2 diabetes mellitus without complications: Secondary | ICD-10-CM | POA: Diagnosis not present

## 2022-11-02 DIAGNOSIS — N2581 Secondary hyperparathyroidism of renal origin: Secondary | ICD-10-CM | POA: Diagnosis not present

## 2022-11-02 DIAGNOSIS — E1140 Type 2 diabetes mellitus with diabetic neuropathy, unspecified: Secondary | ICD-10-CM | POA: Diagnosis not present

## 2022-11-02 DIAGNOSIS — D649 Anemia, unspecified: Secondary | ICD-10-CM | POA: Diagnosis not present

## 2022-11-05 DIAGNOSIS — D631 Anemia in chronic kidney disease: Secondary | ICD-10-CM | POA: Diagnosis not present

## 2022-11-05 DIAGNOSIS — E8770 Fluid overload, unspecified: Secondary | ICD-10-CM | POA: Diagnosis not present

## 2022-11-05 DIAGNOSIS — N186 End stage renal disease: Secondary | ICD-10-CM | POA: Diagnosis not present

## 2022-11-05 DIAGNOSIS — E119 Type 2 diabetes mellitus without complications: Secondary | ICD-10-CM | POA: Diagnosis not present

## 2022-11-05 DIAGNOSIS — Z23 Encounter for immunization: Secondary | ICD-10-CM | POA: Diagnosis not present

## 2022-11-05 DIAGNOSIS — Z992 Dependence on renal dialysis: Secondary | ICD-10-CM | POA: Diagnosis not present

## 2022-11-05 DIAGNOSIS — N2581 Secondary hyperparathyroidism of renal origin: Secondary | ICD-10-CM | POA: Diagnosis not present

## 2022-11-05 DIAGNOSIS — D689 Coagulation defect, unspecified: Secondary | ICD-10-CM | POA: Diagnosis not present

## 2022-11-05 DIAGNOSIS — E1140 Type 2 diabetes mellitus with diabetic neuropathy, unspecified: Secondary | ICD-10-CM | POA: Diagnosis not present

## 2022-11-05 DIAGNOSIS — D649 Anemia, unspecified: Secondary | ICD-10-CM | POA: Diagnosis not present

## 2022-11-06 DIAGNOSIS — N186 End stage renal disease: Secondary | ICD-10-CM | POA: Diagnosis not present

## 2022-11-06 DIAGNOSIS — N2581 Secondary hyperparathyroidism of renal origin: Secondary | ICD-10-CM | POA: Diagnosis not present

## 2022-11-06 DIAGNOSIS — E8770 Fluid overload, unspecified: Secondary | ICD-10-CM | POA: Diagnosis not present

## 2022-11-06 DIAGNOSIS — D649 Anemia, unspecified: Secondary | ICD-10-CM | POA: Diagnosis not present

## 2022-11-06 DIAGNOSIS — Z23 Encounter for immunization: Secondary | ICD-10-CM | POA: Diagnosis not present

## 2022-11-06 DIAGNOSIS — D689 Coagulation defect, unspecified: Secondary | ICD-10-CM | POA: Diagnosis not present

## 2022-11-06 DIAGNOSIS — E1140 Type 2 diabetes mellitus with diabetic neuropathy, unspecified: Secondary | ICD-10-CM | POA: Diagnosis not present

## 2022-11-06 DIAGNOSIS — D631 Anemia in chronic kidney disease: Secondary | ICD-10-CM | POA: Diagnosis not present

## 2022-11-06 DIAGNOSIS — Z992 Dependence on renal dialysis: Secondary | ICD-10-CM | POA: Diagnosis not present

## 2022-11-06 DIAGNOSIS — E119 Type 2 diabetes mellitus without complications: Secondary | ICD-10-CM | POA: Diagnosis not present

## 2022-11-08 DIAGNOSIS — M65331 Trigger finger, right middle finger: Secondary | ICD-10-CM | POA: Diagnosis not present

## 2022-11-09 DIAGNOSIS — N186 End stage renal disease: Secondary | ICD-10-CM | POA: Diagnosis not present

## 2022-11-09 DIAGNOSIS — D649 Anemia, unspecified: Secondary | ICD-10-CM | POA: Diagnosis not present

## 2022-11-09 DIAGNOSIS — Z992 Dependence on renal dialysis: Secondary | ICD-10-CM | POA: Diagnosis not present

## 2022-11-09 DIAGNOSIS — N2581 Secondary hyperparathyroidism of renal origin: Secondary | ICD-10-CM | POA: Diagnosis not present

## 2022-11-09 DIAGNOSIS — D689 Coagulation defect, unspecified: Secondary | ICD-10-CM | POA: Diagnosis not present

## 2022-11-09 DIAGNOSIS — E8770 Fluid overload, unspecified: Secondary | ICD-10-CM | POA: Diagnosis not present

## 2022-11-09 DIAGNOSIS — E119 Type 2 diabetes mellitus without complications: Secondary | ICD-10-CM | POA: Diagnosis not present

## 2022-11-09 DIAGNOSIS — Z23 Encounter for immunization: Secondary | ICD-10-CM | POA: Diagnosis not present

## 2022-11-09 DIAGNOSIS — D631 Anemia in chronic kidney disease: Secondary | ICD-10-CM | POA: Diagnosis not present

## 2022-11-09 DIAGNOSIS — E1140 Type 2 diabetes mellitus with diabetic neuropathy, unspecified: Secondary | ICD-10-CM | POA: Diagnosis not present

## 2022-11-10 DIAGNOSIS — E119 Type 2 diabetes mellitus without complications: Secondary | ICD-10-CM | POA: Diagnosis not present

## 2022-11-10 DIAGNOSIS — D689 Coagulation defect, unspecified: Secondary | ICD-10-CM | POA: Diagnosis not present

## 2022-11-10 DIAGNOSIS — N2581 Secondary hyperparathyroidism of renal origin: Secondary | ICD-10-CM | POA: Diagnosis not present

## 2022-11-10 DIAGNOSIS — Z992 Dependence on renal dialysis: Secondary | ICD-10-CM | POA: Diagnosis not present

## 2022-11-10 DIAGNOSIS — N186 End stage renal disease: Secondary | ICD-10-CM | POA: Diagnosis not present

## 2022-11-10 DIAGNOSIS — E8770 Fluid overload, unspecified: Secondary | ICD-10-CM | POA: Diagnosis not present

## 2022-11-10 DIAGNOSIS — D649 Anemia, unspecified: Secondary | ICD-10-CM | POA: Diagnosis not present

## 2022-11-10 DIAGNOSIS — Z23 Encounter for immunization: Secondary | ICD-10-CM | POA: Diagnosis not present

## 2022-11-10 DIAGNOSIS — E1140 Type 2 diabetes mellitus with diabetic neuropathy, unspecified: Secondary | ICD-10-CM | POA: Diagnosis not present

## 2022-11-10 DIAGNOSIS — D631 Anemia in chronic kidney disease: Secondary | ICD-10-CM | POA: Diagnosis not present

## 2022-11-11 DIAGNOSIS — D689 Coagulation defect, unspecified: Secondary | ICD-10-CM | POA: Diagnosis not present

## 2022-11-11 DIAGNOSIS — Z992 Dependence on renal dialysis: Secondary | ICD-10-CM | POA: Diagnosis not present

## 2022-11-11 DIAGNOSIS — E8770 Fluid overload, unspecified: Secondary | ICD-10-CM | POA: Diagnosis not present

## 2022-11-11 DIAGNOSIS — D649 Anemia, unspecified: Secondary | ICD-10-CM | POA: Diagnosis not present

## 2022-11-11 DIAGNOSIS — E1140 Type 2 diabetes mellitus with diabetic neuropathy, unspecified: Secondary | ICD-10-CM | POA: Diagnosis not present

## 2022-11-11 DIAGNOSIS — D631 Anemia in chronic kidney disease: Secondary | ICD-10-CM | POA: Diagnosis not present

## 2022-11-11 DIAGNOSIS — N2581 Secondary hyperparathyroidism of renal origin: Secondary | ICD-10-CM | POA: Diagnosis not present

## 2022-11-11 DIAGNOSIS — E119 Type 2 diabetes mellitus without complications: Secondary | ICD-10-CM | POA: Diagnosis not present

## 2022-11-11 DIAGNOSIS — Z23 Encounter for immunization: Secondary | ICD-10-CM | POA: Diagnosis not present

## 2022-11-11 DIAGNOSIS — N186 End stage renal disease: Secondary | ICD-10-CM | POA: Diagnosis not present

## 2022-11-12 ENCOUNTER — Telehealth (INDEPENDENT_AMBULATORY_CARE_PROVIDER_SITE_OTHER): Payer: Self-pay | Admitting: *Deleted

## 2022-11-12 DIAGNOSIS — E119 Type 2 diabetes mellitus without complications: Secondary | ICD-10-CM | POA: Diagnosis not present

## 2022-11-12 DIAGNOSIS — Z992 Dependence on renal dialysis: Secondary | ICD-10-CM | POA: Diagnosis not present

## 2022-11-12 DIAGNOSIS — D689 Coagulation defect, unspecified: Secondary | ICD-10-CM | POA: Diagnosis not present

## 2022-11-12 DIAGNOSIS — D649 Anemia, unspecified: Secondary | ICD-10-CM | POA: Diagnosis not present

## 2022-11-12 DIAGNOSIS — E1140 Type 2 diabetes mellitus with diabetic neuropathy, unspecified: Secondary | ICD-10-CM | POA: Diagnosis not present

## 2022-11-12 DIAGNOSIS — N2581 Secondary hyperparathyroidism of renal origin: Secondary | ICD-10-CM | POA: Diagnosis not present

## 2022-11-12 DIAGNOSIS — Z23 Encounter for immunization: Secondary | ICD-10-CM | POA: Diagnosis not present

## 2022-11-12 DIAGNOSIS — N186 End stage renal disease: Secondary | ICD-10-CM | POA: Diagnosis not present

## 2022-11-12 DIAGNOSIS — D631 Anemia in chronic kidney disease: Secondary | ICD-10-CM | POA: Diagnosis not present

## 2022-11-12 DIAGNOSIS — E8770 Fluid overload, unspecified: Secondary | ICD-10-CM | POA: Diagnosis not present

## 2022-11-12 NOTE — Telephone Encounter (Signed)
Pt has called to reschedule; pt is now of for 11/28/22

## 2022-11-12 NOTE — Telephone Encounter (Signed)
Pt called to reschedule her liver biopsy she said at [place]. She said she has sinusitis and just had some teeth pulled and wanted to wait a couple of weeks out to do biopsy. She is scheduled for this Wednesday.   [phone]

## 2022-11-13 DIAGNOSIS — Z992 Dependence on renal dialysis: Secondary | ICD-10-CM | POA: Diagnosis not present

## 2022-11-13 DIAGNOSIS — N186 End stage renal disease: Secondary | ICD-10-CM | POA: Diagnosis not present

## 2022-11-14 ENCOUNTER — Ambulatory Visit (HOSPITAL_COMMUNITY): Payer: Medicare Other

## 2022-11-15 DIAGNOSIS — D631 Anemia in chronic kidney disease: Secondary | ICD-10-CM | POA: Diagnosis not present

## 2022-11-15 DIAGNOSIS — D649 Anemia, unspecified: Secondary | ICD-10-CM | POA: Diagnosis not present

## 2022-11-15 DIAGNOSIS — E119 Type 2 diabetes mellitus without complications: Secondary | ICD-10-CM | POA: Diagnosis not present

## 2022-11-15 DIAGNOSIS — N186 End stage renal disease: Secondary | ICD-10-CM | POA: Diagnosis not present

## 2022-11-15 DIAGNOSIS — D689 Coagulation defect, unspecified: Secondary | ICD-10-CM | POA: Diagnosis not present

## 2022-11-15 DIAGNOSIS — E8770 Fluid overload, unspecified: Secondary | ICD-10-CM | POA: Diagnosis not present

## 2022-11-15 DIAGNOSIS — E1140 Type 2 diabetes mellitus with diabetic neuropathy, unspecified: Secondary | ICD-10-CM | POA: Diagnosis not present

## 2022-11-15 DIAGNOSIS — N2581 Secondary hyperparathyroidism of renal origin: Secondary | ICD-10-CM | POA: Diagnosis not present

## 2022-11-15 DIAGNOSIS — Z992 Dependence on renal dialysis: Secondary | ICD-10-CM | POA: Diagnosis not present

## 2022-11-15 DIAGNOSIS — Z23 Encounter for immunization: Secondary | ICD-10-CM | POA: Diagnosis not present

## 2022-11-16 DIAGNOSIS — N186 End stage renal disease: Secondary | ICD-10-CM | POA: Diagnosis not present

## 2022-11-16 DIAGNOSIS — Z992 Dependence on renal dialysis: Secondary | ICD-10-CM | POA: Diagnosis not present

## 2022-11-16 DIAGNOSIS — Z23 Encounter for immunization: Secondary | ICD-10-CM | POA: Diagnosis not present

## 2022-11-16 DIAGNOSIS — D649 Anemia, unspecified: Secondary | ICD-10-CM | POA: Diagnosis not present

## 2022-11-16 DIAGNOSIS — E1140 Type 2 diabetes mellitus with diabetic neuropathy, unspecified: Secondary | ICD-10-CM | POA: Diagnosis not present

## 2022-11-16 DIAGNOSIS — E119 Type 2 diabetes mellitus without complications: Secondary | ICD-10-CM | POA: Diagnosis not present

## 2022-11-16 DIAGNOSIS — E8770 Fluid overload, unspecified: Secondary | ICD-10-CM | POA: Diagnosis not present

## 2022-11-16 DIAGNOSIS — D689 Coagulation defect, unspecified: Secondary | ICD-10-CM | POA: Diagnosis not present

## 2022-11-16 DIAGNOSIS — D631 Anemia in chronic kidney disease: Secondary | ICD-10-CM | POA: Diagnosis not present

## 2022-11-16 DIAGNOSIS — N2581 Secondary hyperparathyroidism of renal origin: Secondary | ICD-10-CM | POA: Diagnosis not present

## 2022-11-19 DIAGNOSIS — E1140 Type 2 diabetes mellitus with diabetic neuropathy, unspecified: Secondary | ICD-10-CM | POA: Diagnosis not present

## 2022-11-19 DIAGNOSIS — N186 End stage renal disease: Secondary | ICD-10-CM | POA: Diagnosis not present

## 2022-11-19 DIAGNOSIS — Z992 Dependence on renal dialysis: Secondary | ICD-10-CM | POA: Diagnosis not present

## 2022-11-19 DIAGNOSIS — Z23 Encounter for immunization: Secondary | ICD-10-CM | POA: Diagnosis not present

## 2022-11-19 DIAGNOSIS — D649 Anemia, unspecified: Secondary | ICD-10-CM | POA: Diagnosis not present

## 2022-11-19 DIAGNOSIS — D631 Anemia in chronic kidney disease: Secondary | ICD-10-CM | POA: Diagnosis not present

## 2022-11-19 DIAGNOSIS — N2581 Secondary hyperparathyroidism of renal origin: Secondary | ICD-10-CM | POA: Diagnosis not present

## 2022-11-19 DIAGNOSIS — E119 Type 2 diabetes mellitus without complications: Secondary | ICD-10-CM | POA: Diagnosis not present

## 2022-11-19 DIAGNOSIS — E8770 Fluid overload, unspecified: Secondary | ICD-10-CM | POA: Diagnosis not present

## 2022-11-19 DIAGNOSIS — D689 Coagulation defect, unspecified: Secondary | ICD-10-CM | POA: Diagnosis not present

## 2022-11-20 DIAGNOSIS — Z23 Encounter for immunization: Secondary | ICD-10-CM | POA: Diagnosis not present

## 2022-11-20 DIAGNOSIS — D689 Coagulation defect, unspecified: Secondary | ICD-10-CM | POA: Diagnosis not present

## 2022-11-20 DIAGNOSIS — D649 Anemia, unspecified: Secondary | ICD-10-CM | POA: Diagnosis not present

## 2022-11-20 DIAGNOSIS — N2581 Secondary hyperparathyroidism of renal origin: Secondary | ICD-10-CM | POA: Diagnosis not present

## 2022-11-20 DIAGNOSIS — D631 Anemia in chronic kidney disease: Secondary | ICD-10-CM | POA: Diagnosis not present

## 2022-11-20 DIAGNOSIS — Z992 Dependence on renal dialysis: Secondary | ICD-10-CM | POA: Diagnosis not present

## 2022-11-20 DIAGNOSIS — N186 End stage renal disease: Secondary | ICD-10-CM | POA: Diagnosis not present

## 2022-11-20 DIAGNOSIS — E119 Type 2 diabetes mellitus without complications: Secondary | ICD-10-CM | POA: Diagnosis not present

## 2022-11-20 DIAGNOSIS — E8770 Fluid overload, unspecified: Secondary | ICD-10-CM | POA: Diagnosis not present

## 2022-11-20 DIAGNOSIS — E1140 Type 2 diabetes mellitus with diabetic neuropathy, unspecified: Secondary | ICD-10-CM | POA: Diagnosis not present

## 2022-11-22 DIAGNOSIS — D631 Anemia in chronic kidney disease: Secondary | ICD-10-CM | POA: Diagnosis not present

## 2022-11-22 DIAGNOSIS — D649 Anemia, unspecified: Secondary | ICD-10-CM | POA: Diagnosis not present

## 2022-11-22 DIAGNOSIS — E8770 Fluid overload, unspecified: Secondary | ICD-10-CM | POA: Diagnosis not present

## 2022-11-22 DIAGNOSIS — E1140 Type 2 diabetes mellitus with diabetic neuropathy, unspecified: Secondary | ICD-10-CM | POA: Diagnosis not present

## 2022-11-22 DIAGNOSIS — N2581 Secondary hyperparathyroidism of renal origin: Secondary | ICD-10-CM | POA: Diagnosis not present

## 2022-11-22 DIAGNOSIS — D689 Coagulation defect, unspecified: Secondary | ICD-10-CM | POA: Diagnosis not present

## 2022-11-22 DIAGNOSIS — Z23 Encounter for immunization: Secondary | ICD-10-CM | POA: Diagnosis not present

## 2022-11-22 DIAGNOSIS — E119 Type 2 diabetes mellitus without complications: Secondary | ICD-10-CM | POA: Diagnosis not present

## 2022-11-22 DIAGNOSIS — Z992 Dependence on renal dialysis: Secondary | ICD-10-CM | POA: Diagnosis not present

## 2022-11-22 DIAGNOSIS — N186 End stage renal disease: Secondary | ICD-10-CM | POA: Diagnosis not present

## 2022-11-23 DIAGNOSIS — Z23 Encounter for immunization: Secondary | ICD-10-CM | POA: Diagnosis not present

## 2022-11-23 DIAGNOSIS — E1140 Type 2 diabetes mellitus with diabetic neuropathy, unspecified: Secondary | ICD-10-CM | POA: Diagnosis not present

## 2022-11-23 DIAGNOSIS — N186 End stage renal disease: Secondary | ICD-10-CM | POA: Diagnosis not present

## 2022-11-23 DIAGNOSIS — Z992 Dependence on renal dialysis: Secondary | ICD-10-CM | POA: Diagnosis not present

## 2022-11-23 DIAGNOSIS — E119 Type 2 diabetes mellitus without complications: Secondary | ICD-10-CM | POA: Diagnosis not present

## 2022-11-23 DIAGNOSIS — N2581 Secondary hyperparathyroidism of renal origin: Secondary | ICD-10-CM | POA: Diagnosis not present

## 2022-11-23 DIAGNOSIS — D631 Anemia in chronic kidney disease: Secondary | ICD-10-CM | POA: Diagnosis not present

## 2022-11-23 DIAGNOSIS — D649 Anemia, unspecified: Secondary | ICD-10-CM | POA: Diagnosis not present

## 2022-11-23 DIAGNOSIS — E8770 Fluid overload, unspecified: Secondary | ICD-10-CM | POA: Diagnosis not present

## 2022-11-23 DIAGNOSIS — D689 Coagulation defect, unspecified: Secondary | ICD-10-CM | POA: Diagnosis not present

## 2022-11-25 DIAGNOSIS — D649 Anemia, unspecified: Secondary | ICD-10-CM | POA: Diagnosis not present

## 2022-11-25 DIAGNOSIS — Z23 Encounter for immunization: Secondary | ICD-10-CM | POA: Diagnosis not present

## 2022-11-25 DIAGNOSIS — N186 End stage renal disease: Secondary | ICD-10-CM | POA: Diagnosis not present

## 2022-11-25 DIAGNOSIS — E119 Type 2 diabetes mellitus without complications: Secondary | ICD-10-CM | POA: Diagnosis not present

## 2022-11-25 DIAGNOSIS — D689 Coagulation defect, unspecified: Secondary | ICD-10-CM | POA: Diagnosis not present

## 2022-11-25 DIAGNOSIS — N2581 Secondary hyperparathyroidism of renal origin: Secondary | ICD-10-CM | POA: Diagnosis not present

## 2022-11-25 DIAGNOSIS — D631 Anemia in chronic kidney disease: Secondary | ICD-10-CM | POA: Diagnosis not present

## 2022-11-25 DIAGNOSIS — Z992 Dependence on renal dialysis: Secondary | ICD-10-CM | POA: Diagnosis not present

## 2022-11-25 DIAGNOSIS — E8770 Fluid overload, unspecified: Secondary | ICD-10-CM | POA: Diagnosis not present

## 2022-11-25 DIAGNOSIS — E1140 Type 2 diabetes mellitus with diabetic neuropathy, unspecified: Secondary | ICD-10-CM | POA: Diagnosis not present

## 2022-11-27 ENCOUNTER — Other Ambulatory Visit: Payer: Self-pay | Admitting: Radiology

## 2022-11-27 DIAGNOSIS — N2581 Secondary hyperparathyroidism of renal origin: Secondary | ICD-10-CM | POA: Diagnosis not present

## 2022-11-27 DIAGNOSIS — E8770 Fluid overload, unspecified: Secondary | ICD-10-CM | POA: Diagnosis not present

## 2022-11-27 DIAGNOSIS — D631 Anemia in chronic kidney disease: Secondary | ICD-10-CM | POA: Diagnosis not present

## 2022-11-27 DIAGNOSIS — N186 End stage renal disease: Secondary | ICD-10-CM | POA: Diagnosis not present

## 2022-11-27 DIAGNOSIS — D649 Anemia, unspecified: Secondary | ICD-10-CM | POA: Diagnosis not present

## 2022-11-27 DIAGNOSIS — Z992 Dependence on renal dialysis: Secondary | ICD-10-CM | POA: Diagnosis not present

## 2022-11-27 DIAGNOSIS — E119 Type 2 diabetes mellitus without complications: Secondary | ICD-10-CM | POA: Diagnosis not present

## 2022-11-27 DIAGNOSIS — D689 Coagulation defect, unspecified: Secondary | ICD-10-CM | POA: Diagnosis not present

## 2022-11-27 DIAGNOSIS — Z23 Encounter for immunization: Secondary | ICD-10-CM | POA: Diagnosis not present

## 2022-11-27 DIAGNOSIS — R7989 Other specified abnormal findings of blood chemistry: Secondary | ICD-10-CM

## 2022-11-27 DIAGNOSIS — E1140 Type 2 diabetes mellitus with diabetic neuropathy, unspecified: Secondary | ICD-10-CM | POA: Diagnosis not present

## 2022-11-28 ENCOUNTER — Other Ambulatory Visit: Payer: Self-pay

## 2022-11-28 ENCOUNTER — Encounter (HOSPITAL_COMMUNITY): Payer: Self-pay

## 2022-11-28 ENCOUNTER — Ambulatory Visit (HOSPITAL_COMMUNITY)
Admission: RE | Admit: 2022-11-28 | Discharge: 2022-11-28 | Disposition: A | Discharge status: Home / Self Care | Payer: Medicare Other | Source: Ambulatory Visit | Attending: Gastroenterology | Admitting: Gastroenterology

## 2022-11-28 DIAGNOSIS — K761 Chronic passive congestion of liver: Secondary | ICD-10-CM | POA: Insufficient documentation

## 2022-11-28 DIAGNOSIS — E119 Type 2 diabetes mellitus without complications: Secondary | ICD-10-CM | POA: Diagnosis not present

## 2022-11-28 DIAGNOSIS — E1140 Type 2 diabetes mellitus with diabetic neuropathy, unspecified: Secondary | ICD-10-CM | POA: Diagnosis not present

## 2022-11-28 DIAGNOSIS — R7989 Other specified abnormal findings of blood chemistry: Secondary | ICD-10-CM | POA: Diagnosis not present

## 2022-11-28 DIAGNOSIS — E8770 Fluid overload, unspecified: Secondary | ICD-10-CM | POA: Diagnosis not present

## 2022-11-28 DIAGNOSIS — D649 Anemia, unspecified: Secondary | ICD-10-CM | POA: Diagnosis not present

## 2022-11-28 DIAGNOSIS — R140 Abdominal distension (gaseous): Secondary | ICD-10-CM | POA: Diagnosis not present

## 2022-11-28 DIAGNOSIS — G40909 Epilepsy, unspecified, not intractable, without status epilepticus: Secondary | ICD-10-CM | POA: Insufficient documentation

## 2022-11-28 DIAGNOSIS — I120 Hypertensive chronic kidney disease with stage 5 chronic kidney disease or end stage renal disease: Secondary | ICD-10-CM | POA: Diagnosis not present

## 2022-11-28 DIAGNOSIS — E785 Hyperlipidemia, unspecified: Secondary | ICD-10-CM | POA: Diagnosis not present

## 2022-11-28 DIAGNOSIS — K7400 Hepatic fibrosis, unspecified: Secondary | ICD-10-CM | POA: Diagnosis not present

## 2022-11-28 DIAGNOSIS — R768 Other specified abnormal immunological findings in serum: Secondary | ICD-10-CM | POA: Insufficient documentation

## 2022-11-28 DIAGNOSIS — D689 Coagulation defect, unspecified: Secondary | ICD-10-CM | POA: Diagnosis not present

## 2022-11-28 DIAGNOSIS — N2581 Secondary hyperparathyroidism of renal origin: Secondary | ICD-10-CM | POA: Diagnosis not present

## 2022-11-28 DIAGNOSIS — Z992 Dependence on renal dialysis: Secondary | ICD-10-CM | POA: Diagnosis not present

## 2022-11-28 DIAGNOSIS — Z9884 Bariatric surgery status: Secondary | ICD-10-CM | POA: Diagnosis not present

## 2022-11-28 DIAGNOSIS — R109 Unspecified abdominal pain: Secondary | ICD-10-CM | POA: Insufficient documentation

## 2022-11-28 DIAGNOSIS — N186 End stage renal disease: Secondary | ICD-10-CM | POA: Insufficient documentation

## 2022-11-28 DIAGNOSIS — D631 Anemia in chronic kidney disease: Secondary | ICD-10-CM | POA: Diagnosis not present

## 2022-11-28 DIAGNOSIS — K580 Irritable bowel syndrome with diarrhea: Secondary | ICD-10-CM | POA: Diagnosis not present

## 2022-11-28 DIAGNOSIS — Z23 Encounter for immunization: Secondary | ICD-10-CM | POA: Diagnosis not present

## 2022-11-28 DIAGNOSIS — K7689 Other specified diseases of liver: Secondary | ICD-10-CM | POA: Diagnosis not present

## 2022-11-28 DIAGNOSIS — Z85528 Personal history of other malignant neoplasm of kidney: Secondary | ICD-10-CM | POA: Diagnosis not present

## 2022-11-28 DIAGNOSIS — K769 Liver disease, unspecified: Secondary | ICD-10-CM | POA: Diagnosis not present

## 2022-11-28 LAB — CBC
HCT: 38.9 % (ref 36.0–46.0)
Hemoglobin: 12.4 g/dL (ref 12.0–15.0)
MCH: 29 pg (ref 26.0–34.0)
MCHC: 31.9 g/dL (ref 30.0–36.0)
MCV: 90.9 fL (ref 80.0–100.0)
Platelets: 122 10*3/uL — ABNORMAL LOW (ref 150–400)
RBC: 4.28 MIL/uL (ref 3.87–5.11)
RDW: 16.5 % — ABNORMAL HIGH (ref 11.5–15.5)
WBC: 3.9 10*3/uL — ABNORMAL LOW (ref 4.0–10.5)
nRBC: 0 % (ref 0.0–0.2)

## 2022-11-28 LAB — GLUCOSE, CAPILLARY
Glucose-Capillary: 111 mg/dL — ABNORMAL HIGH (ref 70–99)
Glucose-Capillary: 118 mg/dL — ABNORMAL HIGH (ref 70–99)

## 2022-11-28 LAB — PROTIME-INR
INR: 1.1 (ref 0.8–1.2)
Prothrombin Time: 14.3 s (ref 11.4–15.2)

## 2022-11-28 MED ORDER — GELATIN ABSORBABLE 12-7 MM EX MISC
1.0000 | Freq: Once | CUTANEOUS | Status: AC
  Administered 2022-11-28: 1 via TOPICAL

## 2022-11-28 MED ORDER — SODIUM CHLORIDE 0.9 % IV SOLN: INTRAVENOUS | Status: DC

## 2022-11-28 MED ORDER — LIDOCAINE HCL (PF) 1 % IJ SOLN
9.0000 mL | Freq: Once | INTRAMUSCULAR | Status: AC
  Administered 2022-11-28: 9 mL via INTRADERMAL

## 2022-11-28 MED ORDER — MIDAZOLAM HCL 2 MG/2ML IJ SOLN
INTRAMUSCULAR | Status: AC
  Filled 2022-11-28: qty 4

## 2022-11-28 MED ORDER — MIDAZOLAM HCL 2 MG/2ML IJ SOLN
INTRAMUSCULAR | Status: AC | PRN
Start: 2022-11-28 — End: 2022-11-28
  Administered 2022-11-28 (×2): 1 mg via INTRAVENOUS

## 2022-11-28 NOTE — H&P (Signed)
Chief Complaint: Patient was seen in consultation today for liver core biopsy at the request of Dr Levon Hedger  Supervising Physician: Gilmer Mor  Patient Status: [facility] - Out-pt  History of Present Illness: Margaret Huerta is a 77 y.o. female   FULL Code status per pt Chronic diarrhea Previous Roux-en-Y IBS; anemia; Seizure disorder; HTN; HLD Parathyroid disease; Renal cancer ESRD  Referred to GI MD regarding new elevated LFTs Abd pain; bloating Diarrhea several times per day--- especially after eating Noted lactose intolerance per pt  Scheduled for random liver biopsy   Past Medical History:  Diagnosis Date   Anemia    Anxiety    Cervical cancer (HCC)    Chronic bronchitis (HCC)    "get it q yr"   Chronic lower back pain    Depression    Febrile seizure (HCC)    "as a child"   Fibromyalgia    GERD (gastroesophageal reflux disease)    Gout    Hemodialysis patient (HCC)    History of blood transfusion    "S/P tonsillectomy"   History of hiatal hernia    Hx of cardiovascular stress test 05/14/2014   false positive Myoview   Hyperlipidemia    Hypertension    Migraine    "monthly" (05/26/2014)   Neuropathy    Osteoporosis    Parathyroid disease (HCC)    "my PHT levels run high; I can't take calcium"   Pneumonia "several times"   Renal cancer (HCC)    "right"-s/p nephrectomy   Renal insufficiency    "left kidney works at 40-60%" (05/26/2014)   Sleep apnea    "wore mask for 2 months; could not take it" (05/26/2014)   Type II diabetes mellitus (HCC)    Typhus fever    "as a child"    Past Surgical History:  Procedure Laterality Date   ABDOMINAL HERNIA REPAIR  02/2013   "in [place]"   ABDOMINAL HYSTERECTOMY  1977   "partial"   APPENDECTOMY     AV FISTULA PLACEMENT Left 06/13/2021   Procedure: LEFT ARM ARTERIOVENOUS FISTULA CREATION;  Surgeon: Larina Earthly, MD;  Location: [facility];  Service: Vascular;  Laterality: Left;   BACK SURGERY     BILATERAL  SALPINGOOPHORECTOMY Bilateral ~ 1993   BIOPSY  10/16/2022   Procedure: BIOPSY;  Surgeon: Dolores Frame, MD;  Location: [facility];  Service: Gastroenterology;;   CARDIAC CATHETERIZATION  2003, April 2016   normal coronaries   CATARACT EXTRACTION W/ INTRAOCULAR LENS  IMPLANT, BILATERAL  2015   COLONOSCOPY N/A 11/25/2014   Procedure: COLONOSCOPY;  Surgeon: Malissa Hippo, MD;  Location: [facility];  Service: Endoscopy;  Laterality: N/A;   COLONOSCOPY WITH PROPOFOL N/A 12/12/2021   Procedure: COLONOSCOPY WITH PROPOFOL;  Surgeon: Dolores Frame, MD;  Location: [facility];  Service: Gastroenterology;  Laterality: N/A;  130 ASA 3 patient has dialysis Mon Wed & Fri, per Soledad Gerlach pt knows new arrival time   CYSTOSCOPY W/ Ramer MANIPULATION  1988   DIAGNOSTIC LAPAROSCOPY  1975   DILATION AND CURETTAGE OF UTERUS     ESOPHAGOGASTRODUODENOSCOPY N/A 11/25/2014   Procedure: ESOPHAGOGASTRODUODENOSCOPY (EGD);  Surgeon: Malissa Hippo, MD;  [facility]: [facility];  Service: Endoscopy;  Laterality: N/A;  9:30 - moved to 11:25 - [name] to notify pt   ESOPHAGOGASTRODUODENOSCOPY (EGD) WITH ESOPHAGEAL DILATION  1997   ESOPHAGOGASTRODUODENOSCOPY (EGD) WITH PROPOFOL N/A 10/16/2022   Procedure: ESOPHAGOGASTRODUODENOSCOPY (EGD) WITH PROPOFOL;  Surgeon: Dolores Frame,  MD;  [facility]: [facility];  Service: Gastroenterology;  Laterality: N/A;  9:30am;asa 3   EYE SURGERY     GASTRIC BYPASS  2012   HERNIA REPAIR     2015   HIATAL HERNIA REPAIR  1996   INCONTINENCE SURGERY  1983   INSERTION OF DIALYSIS CATHETER Right 09/19/2021   Procedure: INSERTION OF TUNNELED DIALYSIS CATHETER;  Surgeon: Larina Earthly, MD;  Location: [facility];  Service: Vascular;  Laterality: Right;   KNEE ARTHROSCOPY Left 2000's   LAPAROSCOPIC CHOLECYSTECTOMY  1990's   LEFT HEART CATHETERIZATION WITH CORONARY ANGIOGRAM N/A 05/27/2014   Procedure: LEFT HEART CATHETERIZATION WITH CORONARY ANGIOGRAM;  Surgeon:  Runell Gess, MD;  Location: [facility] CATH LAB;  Service: Cardiovascular;  Laterality: N/A;   LIGATION OF ARTERIOVENOUS  FISTULA Left 09/19/2021   Procedure: LIGATION OF ARTERIOVENOUS  FISTULA;  Surgeon: Larina Earthly, MD;  Location: [facility];  Service: Vascular;  Laterality: Left;   LUMBAR DISC SURGERY  2001   "herniated discs"   MOUTH SURGERY  1991   "drilled into gum and put tooth implants uppers"   NEPHRECTOMY Right 2002   "cancer"   POLYPECTOMY  12/12/2021   Procedure: POLYPECTOMY;  Surgeon: Marguerita Merles, Reuel Boom, MD;  Location: [facility];  Service: Gastroenterology;;   TMJ ARTHROPLASTY  1988   TONSILLECTOMY  1976   UPPER GI ENDOSCOPY      Allergies: Demerol [meperidine], Lyrica [pregabalin], Vicodin [hydrocodone-acetaminophen], Ace inhibitors, Aleve [naproxen], Codeine, Contrast media [iodinated contrast media], Fentanyl, Gluten meal, Lactose intolerance (gi), Morphine, Neurontin [gabapentin], Norvasc [amlodipine], Nsaids, Other, and Dilaudid [hydromorphone hcl]  Medications: Prior to Admission medications   Medication Sig Start Date End Date Taking? Authorizing Provider  acetaminophen (TYLENOL) 500 MG tablet Take 1,000 mg by mouth every 6 (six) hours as needed (for pain.).   Yes [provider]  allopurinol (ZYLOPRIM) 300 MG tablet Take 300 mg by mouth in the morning. 05/09/17  Yes [provider]  ALPRAZolam Prudy Feeler) 0.25 MG tablet Take 0.25 mg by mouth every Monday, Wednesday, and Friday with hemodialysis. 05/23/21  Yes [provider]  atenolol (TENORMIN) 50 MG tablet Take 0.5 tablets (25 mg total) by mouth 2 (two) times daily. 06/29/22  Yes BranchDorothe Pea, MD  calcitRIOL (ROCALTROL) 0.25 MCG capsule Take 0.25 mcg by mouth in the morning.   Yes [provider]  Cholecalciferol (VITAMIN D3) 250 MCG (10000 UT) TABS Take 10,000 Units by mouth in the morning.   Yes [provider]  docusate sodium (COLACE) 50 MG capsule Take 50 mg by  mouth at bedtime.   Yes [provider]  DULoxetine (CYMBALTA) 60 MG capsule Take 60 mg by mouth in the morning.   Yes [provider]  furosemide (LASIX) 40 MG tablet Take 40 mg by mouth in the morning. 40 mg in the evening Monday- Friday, 40 mg twice daily on Saturday & Sundays.   Yes [provider]  gabapentin (NEURONTIN) 100 MG capsule Take 100 mg by mouth daily.   Yes [provider]  hyoscyamine (LEVSIN) 0.125 MG tablet Take 1 tablet (0.125 mg total) by mouth every 8 (eight) hours as needed (abdominal pain). 10/18/22  Yes Dolores Frame, MD  isosorbide dinitrate (ISORDIL) 10 MG tablet Take 1 tablet by mouth twice daily 07/30/22  Yes Branch, Dorothe Pea, MD  ondansetron (ZOFRAN-ODT) 4 MG disintegrating tablet Take 1 tablet (4 mg total) by mouth every 8 (eight) hours as needed for nausea or vomiting. 06/11/22  Yes Aida Raider, NP  rosuvastatin (CRESTOR) 10 MG tablet Take 1 tablet (10 mg total) by mouth daily. 12/01/19  Yes Netta Neat., NP  sevelamer carbonate (RENVELA) 800 MG tablet Take 800 mg by mouth 3 (three) times daily. 09/13/21  Yes [provider]  sodium bicarbonate 650 MG tablet Take 650 mg by mouth 2 (two) times daily. 04/15/21  Yes [provider]  aspirin EC 81 MG tablet Take 81 mg by mouth in the morning. Swallow whole.    [provider]  NITROSTAT 0.4 MG SL tablet Place 1 tablet (0.4 mg total) under the tongue every 5 (five) minutes x 3 doses as needed. 09/24/22   Antoine Poche, MD     Family History  Problem Relation Age of Onset   Emphysema Father    Heart disease Father    Clotting disorder Mother    Diabetes Mother    Kidney disease Mother    Allergies Other        whole family per pt   Lung cancer Maternal Uncle    Alcohol abuse Maternal Uncle     Social History   Socioeconomic History   Marital status: Married    Spouse name: Arieanna Pressey   Number of children: 1   Years of  education: 12   Highest education level: 12th grade  Occupational History   Occupation: Housewife  Tobacco Use   Smoking status: Never    Passive exposure: Never   Smokeless tobacco: Never  Vaping Use   Vaping status: Never Used  Substance and Sexual Activity   Alcohol use: No    Alcohol/week: 0.0 standard drinks of alcohol   Drug use: No   Sexual activity: Yes    Partners: Male    Birth control/protection: Surgical  Other Topics Concern   Not on file  Social History Narrative   Not on file   Social Determinants of Health   Financial Resource Strain: Low Risk  (11/06/2021)   Received from [facility], [facility]   Overall Financial Resource Strain (CARDIA)    Difficulty of Paying Living Expenses: Not hard at all  Food Insecurity: No Food Insecurity (11/07/2021)   Hunger Vital Sign    Worried About Running Out of Food in the Last Year: Never true    Ran Out of Food in the Last Year: Never true  Transportation Needs: No Transportation Needs (11/07/2021)   PRAPARE - Administrator, Civil Service (Medical): No    Lack of Transportation (Non-Medical): No  Physical Activity: Inactive (11/06/2021)   Received from [facility]   Exercise Vital Sign    Days of Exercise per Week: 0 days    Minutes of Exercise per Session: 0 min  Stress: No Stress Concern Present (11/14/2021)   Received from [facility], [facility] - Occupational Stress Questionnaire    Feeling of Stress : Only a little  Social Connections: Unknown (11/11/2022)   Received from [facility]   Social Network    Social Network: Not on file    Review of Systems: A 12 point ROS discussed and pertinent positives are indicated in the HPI above.  All other systems are negative.  Review of Systems  Constitutional:  Negative for activity change, fatigue and fever.  Respiratory:  Negative for cough and shortness of breath.   Gastrointestinal:   Positive for abdominal pain, diarrhea and vomiting.  Neurological:  Negative  for weakness.  Psychiatric/Behavioral:  Negative for behavioral problems and confusion.     Vital Signs: BP (!) 151/81   Pulse 61   Temp 97.6 F (36.4 C) (Oral)   Resp 17   Ht 5\' 2"  (1.575 m)   Wt 165 lb (74.8 kg)   SpO2 98%   BMI 30.18 kg/m   Advance Care Plan: The advanced care plan/surrogate decision maker was discussed at the time of visit and documented in the medical record.    Physical Exam Vitals reviewed.  HENT:     Mouth/Throat:     Mouth: Mucous membranes are moist.  Cardiovascular:     Rate and Rhythm: Normal rate and regular rhythm.     Heart sounds: Normal heart sounds.  Pulmonary:     Effort: Pulmonary effort is normal.     Breath sounds: Normal breath sounds.  Abdominal:     Palpations: Abdomen is soft.     Tenderness: There is no abdominal tenderness.  Musculoskeletal:        General: Normal range of motion.  Skin:    General: Skin is warm.  Neurological:     Mental Status: She is alert and oriented to person, place, and time.  Psychiatric:        Behavior: Behavior normal.     Imaging: No results found.  Labs:  CBC: Recent Labs    12/12/21 1106 01/12/22 1709 10/16/22 0756  WBC 3.9* 3.7*  --   HGB 13.1 12.3 13.9  HCT 42.4 39.5 41.0  PLT 137* 82*  --     COAGS: No results for input(s): "INR", "APTT" in the last 8760 hours.  BMP: Recent Labs    12/12/21 1106 01/12/22 1709 10/16/22 0756  NA 137 136 136  K 3.9 3.7 3.6  CL 108 101 101  CO2 22 26  --   GLUCOSE 137* 109* 154*  BUN 28* 16 42*  CALCIUM 9.4 9.3  --   CREATININE 3.51* 2.91* 6.00*  GFRNONAA 13* 16*  --     LIVER FUNCTION TESTS: No results for input(s): "BILITOT", "AST", "ALT", "ALKPHOS", "PROT", "ALBUMIN" in the last 8760 hours.  TUMOR MARKERS: No results for input(s): "AFPTM", "CEA", "CA199", "CHROMGRNA" in the last 8760 hours.  Assessment and Plan:  Scheduled for liver core  biopsy Risks and benefits of liver core biopsy was discussed with the patient and/or patient's family including, but not limited to bleeding, infection, damage to adjacent structures or low yield requiring additional tests.  All of the questions were answered and there is agreement to proceed.  Consent signed and in chart.  Thank you for this interesting consult.  I greatly enjoyed meeting Margaret Huerta and look forward to participating in their care.  A copy of this report was sent to the requesting provider on this date.  Electronically Signed: Robet Leu, PA-C 11/28/2022, 12:02 PM   I spent a total of  30 Minutes   in face to face in clinical consultation, greater than 50% of which was counseling/coordinating care for liver core biopsy

## 2022-11-29 DIAGNOSIS — E8770 Fluid overload, unspecified: Secondary | ICD-10-CM | POA: Diagnosis not present

## 2022-11-29 DIAGNOSIS — D649 Anemia, unspecified: Secondary | ICD-10-CM | POA: Diagnosis not present

## 2022-11-29 DIAGNOSIS — E119 Type 2 diabetes mellitus without complications: Secondary | ICD-10-CM | POA: Diagnosis not present

## 2022-11-29 DIAGNOSIS — D631 Anemia in chronic kidney disease: Secondary | ICD-10-CM | POA: Diagnosis not present

## 2022-11-29 DIAGNOSIS — Z992 Dependence on renal dialysis: Secondary | ICD-10-CM | POA: Diagnosis not present

## 2022-11-29 DIAGNOSIS — E1140 Type 2 diabetes mellitus with diabetic neuropathy, unspecified: Secondary | ICD-10-CM | POA: Diagnosis not present

## 2022-11-29 DIAGNOSIS — N2581 Secondary hyperparathyroidism of renal origin: Secondary | ICD-10-CM | POA: Diagnosis not present

## 2022-11-29 DIAGNOSIS — Z23 Encounter for immunization: Secondary | ICD-10-CM | POA: Diagnosis not present

## 2022-11-29 DIAGNOSIS — N186 End stage renal disease: Secondary | ICD-10-CM | POA: Diagnosis not present

## 2022-11-29 DIAGNOSIS — D689 Coagulation defect, unspecified: Secondary | ICD-10-CM | POA: Diagnosis not present

## 2022-11-29 LAB — SURGICAL PATHOLOGY

## 2022-11-30 DIAGNOSIS — N186 End stage renal disease: Secondary | ICD-10-CM | POA: Diagnosis not present

## 2022-11-30 DIAGNOSIS — E119 Type 2 diabetes mellitus without complications: Secondary | ICD-10-CM | POA: Diagnosis not present

## 2022-11-30 DIAGNOSIS — E8770 Fluid overload, unspecified: Secondary | ICD-10-CM | POA: Diagnosis not present

## 2022-11-30 DIAGNOSIS — D631 Anemia in chronic kidney disease: Secondary | ICD-10-CM | POA: Diagnosis not present

## 2022-11-30 DIAGNOSIS — Z23 Encounter for immunization: Secondary | ICD-10-CM | POA: Diagnosis not present

## 2022-11-30 DIAGNOSIS — D689 Coagulation defect, unspecified: Secondary | ICD-10-CM | POA: Diagnosis not present

## 2022-11-30 DIAGNOSIS — D649 Anemia, unspecified: Secondary | ICD-10-CM | POA: Diagnosis not present

## 2022-11-30 DIAGNOSIS — Z992 Dependence on renal dialysis: Secondary | ICD-10-CM | POA: Diagnosis not present

## 2022-11-30 DIAGNOSIS — Z79899 Other long term (current) drug therapy: Secondary | ICD-10-CM | POA: Diagnosis not present

## 2022-11-30 DIAGNOSIS — E1140 Type 2 diabetes mellitus with diabetic neuropathy, unspecified: Secondary | ICD-10-CM | POA: Diagnosis not present

## 2022-11-30 DIAGNOSIS — E785 Hyperlipidemia, unspecified: Secondary | ICD-10-CM | POA: Diagnosis not present

## 2022-11-30 DIAGNOSIS — E1129 Type 2 diabetes mellitus with other diabetic kidney complication: Secondary | ICD-10-CM | POA: Diagnosis not present

## 2022-11-30 DIAGNOSIS — N2581 Secondary hyperparathyroidism of renal origin: Secondary | ICD-10-CM | POA: Diagnosis not present

## 2022-11-30 DIAGNOSIS — R7401 Elevation of levels of liver transaminase levels: Secondary | ICD-10-CM | POA: Diagnosis not present

## 2022-12-03 DIAGNOSIS — N2581 Secondary hyperparathyroidism of renal origin: Secondary | ICD-10-CM | POA: Diagnosis not present

## 2022-12-03 DIAGNOSIS — E8770 Fluid overload, unspecified: Secondary | ICD-10-CM | POA: Diagnosis not present

## 2022-12-03 DIAGNOSIS — D631 Anemia in chronic kidney disease: Secondary | ICD-10-CM | POA: Diagnosis not present

## 2022-12-03 DIAGNOSIS — D649 Anemia, unspecified: Secondary | ICD-10-CM | POA: Diagnosis not present

## 2022-12-03 DIAGNOSIS — Z992 Dependence on renal dialysis: Secondary | ICD-10-CM | POA: Diagnosis not present

## 2022-12-03 DIAGNOSIS — N186 End stage renal disease: Secondary | ICD-10-CM | POA: Diagnosis not present

## 2022-12-03 DIAGNOSIS — E1140 Type 2 diabetes mellitus with diabetic neuropathy, unspecified: Secondary | ICD-10-CM | POA: Diagnosis not present

## 2022-12-03 DIAGNOSIS — Z23 Encounter for immunization: Secondary | ICD-10-CM | POA: Diagnosis not present

## 2022-12-03 DIAGNOSIS — E119 Type 2 diabetes mellitus without complications: Secondary | ICD-10-CM | POA: Diagnosis not present

## 2022-12-03 DIAGNOSIS — D689 Coagulation defect, unspecified: Secondary | ICD-10-CM | POA: Diagnosis not present

## 2022-12-04 DIAGNOSIS — N2581 Secondary hyperparathyroidism of renal origin: Secondary | ICD-10-CM | POA: Diagnosis not present

## 2022-12-04 DIAGNOSIS — D689 Coagulation defect, unspecified: Secondary | ICD-10-CM | POA: Diagnosis not present

## 2022-12-04 DIAGNOSIS — Z992 Dependence on renal dialysis: Secondary | ICD-10-CM | POA: Diagnosis not present

## 2022-12-04 DIAGNOSIS — D631 Anemia in chronic kidney disease: Secondary | ICD-10-CM | POA: Diagnosis not present

## 2022-12-04 DIAGNOSIS — E1140 Type 2 diabetes mellitus with diabetic neuropathy, unspecified: Secondary | ICD-10-CM | POA: Diagnosis not present

## 2022-12-04 DIAGNOSIS — N186 End stage renal disease: Secondary | ICD-10-CM | POA: Diagnosis not present

## 2022-12-04 DIAGNOSIS — E119 Type 2 diabetes mellitus without complications: Secondary | ICD-10-CM | POA: Diagnosis not present

## 2022-12-04 DIAGNOSIS — E8770 Fluid overload, unspecified: Secondary | ICD-10-CM | POA: Diagnosis not present

## 2022-12-04 DIAGNOSIS — Z23 Encounter for immunization: Secondary | ICD-10-CM | POA: Diagnosis not present

## 2022-12-04 DIAGNOSIS — D649 Anemia, unspecified: Secondary | ICD-10-CM | POA: Diagnosis not present

## 2022-12-05 DIAGNOSIS — I2510 Atherosclerotic heart disease of native coronary artery without angina pectoris: Secondary | ICD-10-CM | POA: Diagnosis not present

## 2022-12-05 DIAGNOSIS — Z23 Encounter for immunization: Secondary | ICD-10-CM | POA: Diagnosis not present

## 2022-12-05 DIAGNOSIS — N186 End stage renal disease: Secondary | ICD-10-CM | POA: Diagnosis not present

## 2022-12-05 DIAGNOSIS — R945 Abnormal results of liver function studies: Secondary | ICD-10-CM | POA: Diagnosis not present

## 2022-12-05 DIAGNOSIS — E1122 Type 2 diabetes mellitus with diabetic chronic kidney disease: Secondary | ICD-10-CM | POA: Diagnosis not present

## 2022-12-06 DIAGNOSIS — E119 Type 2 diabetes mellitus without complications: Secondary | ICD-10-CM | POA: Diagnosis not present

## 2022-12-06 DIAGNOSIS — D649 Anemia, unspecified: Secondary | ICD-10-CM | POA: Diagnosis not present

## 2022-12-06 DIAGNOSIS — E8770 Fluid overload, unspecified: Secondary | ICD-10-CM | POA: Diagnosis not present

## 2022-12-06 DIAGNOSIS — N186 End stage renal disease: Secondary | ICD-10-CM | POA: Diagnosis not present

## 2022-12-06 DIAGNOSIS — N2581 Secondary hyperparathyroidism of renal origin: Secondary | ICD-10-CM | POA: Diagnosis not present

## 2022-12-06 DIAGNOSIS — E1140 Type 2 diabetes mellitus with diabetic neuropathy, unspecified: Secondary | ICD-10-CM | POA: Diagnosis not present

## 2022-12-06 DIAGNOSIS — Z992 Dependence on renal dialysis: Secondary | ICD-10-CM | POA: Diagnosis not present

## 2022-12-06 DIAGNOSIS — Z23 Encounter for immunization: Secondary | ICD-10-CM | POA: Diagnosis not present

## 2022-12-06 DIAGNOSIS — D689 Coagulation defect, unspecified: Secondary | ICD-10-CM | POA: Diagnosis not present

## 2022-12-06 DIAGNOSIS — D631 Anemia in chronic kidney disease: Secondary | ICD-10-CM | POA: Diagnosis not present

## 2022-12-08 DIAGNOSIS — E1140 Type 2 diabetes mellitus with diabetic neuropathy, unspecified: Secondary | ICD-10-CM | POA: Diagnosis not present

## 2022-12-08 DIAGNOSIS — N2581 Secondary hyperparathyroidism of renal origin: Secondary | ICD-10-CM | POA: Diagnosis not present

## 2022-12-08 DIAGNOSIS — D631 Anemia in chronic kidney disease: Secondary | ICD-10-CM | POA: Diagnosis not present

## 2022-12-08 DIAGNOSIS — N186 End stage renal disease: Secondary | ICD-10-CM | POA: Diagnosis not present

## 2022-12-08 DIAGNOSIS — E8770 Fluid overload, unspecified: Secondary | ICD-10-CM | POA: Diagnosis not present

## 2022-12-08 DIAGNOSIS — D689 Coagulation defect, unspecified: Secondary | ICD-10-CM | POA: Diagnosis not present

## 2022-12-08 DIAGNOSIS — Z23 Encounter for immunization: Secondary | ICD-10-CM | POA: Diagnosis not present

## 2022-12-08 DIAGNOSIS — Z992 Dependence on renal dialysis: Secondary | ICD-10-CM | POA: Diagnosis not present

## 2022-12-08 DIAGNOSIS — E119 Type 2 diabetes mellitus without complications: Secondary | ICD-10-CM | POA: Diagnosis not present

## 2022-12-08 DIAGNOSIS — D649 Anemia, unspecified: Secondary | ICD-10-CM | POA: Diagnosis not present

## 2022-12-10 ENCOUNTER — Ambulatory Visit: Payer: Medicare Other | Admitting: Cardiology

## 2022-12-10 DIAGNOSIS — D649 Anemia, unspecified: Secondary | ICD-10-CM | POA: Diagnosis not present

## 2022-12-10 DIAGNOSIS — D631 Anemia in chronic kidney disease: Secondary | ICD-10-CM | POA: Diagnosis not present

## 2022-12-10 DIAGNOSIS — D689 Coagulation defect, unspecified: Secondary | ICD-10-CM | POA: Diagnosis not present

## 2022-12-10 DIAGNOSIS — Z992 Dependence on renal dialysis: Secondary | ICD-10-CM | POA: Diagnosis not present

## 2022-12-10 DIAGNOSIS — N186 End stage renal disease: Secondary | ICD-10-CM | POA: Diagnosis not present

## 2022-12-10 DIAGNOSIS — Z23 Encounter for immunization: Secondary | ICD-10-CM | POA: Diagnosis not present

## 2022-12-10 DIAGNOSIS — E1140 Type 2 diabetes mellitus with diabetic neuropathy, unspecified: Secondary | ICD-10-CM | POA: Diagnosis not present

## 2022-12-10 DIAGNOSIS — E8770 Fluid overload, unspecified: Secondary | ICD-10-CM | POA: Diagnosis not present

## 2022-12-10 DIAGNOSIS — E119 Type 2 diabetes mellitus without complications: Secondary | ICD-10-CM | POA: Diagnosis not present

## 2022-12-10 DIAGNOSIS — N2581 Secondary hyperparathyroidism of renal origin: Secondary | ICD-10-CM | POA: Diagnosis not present

## 2022-12-11 ENCOUNTER — Ambulatory Visit: Payer: Medicare Other | Attending: Cardiology | Admitting: Cardiology

## 2022-12-11 ENCOUNTER — Telehealth: Payer: Self-pay | Admitting: Cardiology

## 2022-12-11 ENCOUNTER — Encounter: Payer: Self-pay | Admitting: Cardiology

## 2022-12-11 VITALS — BP 112/80 | HR 52 | Ht 62.0 in | Wt 168.8 lb

## 2022-12-11 DIAGNOSIS — N2581 Secondary hyperparathyroidism of renal origin: Secondary | ICD-10-CM | POA: Diagnosis not present

## 2022-12-11 DIAGNOSIS — E782 Mixed hyperlipidemia: Secondary | ICD-10-CM | POA: Diagnosis not present

## 2022-12-11 DIAGNOSIS — E119 Type 2 diabetes mellitus without complications: Secondary | ICD-10-CM | POA: Diagnosis not present

## 2022-12-11 DIAGNOSIS — I10 Essential (primary) hypertension: Secondary | ICD-10-CM | POA: Diagnosis not present

## 2022-12-11 DIAGNOSIS — Z992 Dependence on renal dialysis: Secondary | ICD-10-CM | POA: Diagnosis not present

## 2022-12-11 DIAGNOSIS — D649 Anemia, unspecified: Secondary | ICD-10-CM | POA: Diagnosis not present

## 2022-12-11 DIAGNOSIS — E8770 Fluid overload, unspecified: Secondary | ICD-10-CM | POA: Diagnosis not present

## 2022-12-11 DIAGNOSIS — R0789 Other chest pain: Secondary | ICD-10-CM | POA: Diagnosis not present

## 2022-12-11 DIAGNOSIS — E1140 Type 2 diabetes mellitus with diabetic neuropathy, unspecified: Secondary | ICD-10-CM | POA: Diagnosis not present

## 2022-12-11 DIAGNOSIS — N186 End stage renal disease: Secondary | ICD-10-CM | POA: Diagnosis not present

## 2022-12-11 DIAGNOSIS — D631 Anemia in chronic kidney disease: Secondary | ICD-10-CM | POA: Diagnosis not present

## 2022-12-11 DIAGNOSIS — D689 Coagulation defect, unspecified: Secondary | ICD-10-CM | POA: Diagnosis not present

## 2022-12-11 DIAGNOSIS — Z23 Encounter for immunization: Secondary | ICD-10-CM | POA: Diagnosis not present

## 2022-12-11 NOTE — Patient Instructions (Signed)
Medication Instructions:   Stop Furosemide (Lasix) Continue all other medications.     Labwork:  none  Testing/Procedures:  Your physician has requested that you have an echocardiogram. Echocardiography is a painless test that uses sound waves to create images of your heart. It provides your doctor with information about the size and shape of your heart and how well your heart's chambers and valves are working. This procedure takes approximately one hour. There are no restrictions for this procedure. Please do NOT wear cologne, perfume, aftershave, or lotions (deodorant is allowed). Please arrive 15 minutes prior to your appointment time. Office will contact with results via phone, letter or mychart.    Follow-Up:  6 months   Any Other Special Instructions Will Be Listed Below (If Applicable).   If you need a refill on your cardiac medications before your next appointment, please call your pharmacy.

## 2022-12-11 NOTE — Progress Notes (Signed)
Clinical Summary Margaret Huerta is a 77 y.o.female seen today for follow up of the following medical problems.      1. History of chest pain Patient had left heart cath in 2003 (a J.  [facility]an[facility]) and in 2016.  For abnormal stress test.  These both showed normal coronary arteries   07/2019 mild ischemia, low risk - previously has responved to SL NG, symptoms improved on isordil  - some chest pains at times, most often towards at end of HD - some exertional chest paisn, takes NG about 3 times week.  - overal symptoms are similar to her chronic chest pain symptoms  - some chest pains at times, can occur with HD. Less often with shorter HD duration.    2. HTN Compliant with meds - has had chronic issues with orthostatic hypotension, appear somewhat worsened since starting dialysis - low bp's at times, some orthostatic symptoms.    - low bp's during HD, nephrology had recommended stopping hydralazine. Ongoing orthostatic symptoms, had orthostatic fall after HD.    -last visit we stopped hydralazine, then lowered atenolol 25mg  bid - ongoing orthostatic dizziness. BP's still low with HD - nephrology had raised her dry weight previously, lowered amount of fluid removal - atenolol helps with migraines.  - orthostatics abnormal in HD, drops 20-30s.     3. Hyperlipidemia - labs followed by pcp - she is on crestor 08/2021 TC 127 TG 121 HDL 53 LDL 53           4. ESRD - Dr Wolfgang Phoenix follows - left sided fistula developed steal syndrome. - doing home peritoneal HD. Low bp's at times to 80s to 90s with standing       4. DM2   5. OSA  - not comfortable with cpap, not using.    6. Orthostatic syncope -  chronic orthostatic dizzienss at times   7. Mild MR - by echo 2021  8.Elevated liver enzymes - recent liver biopsy   SH: husband diagnosed colon cancer, due for surgery this month June 2023 Past Medical History:  Diagnosis Date   Anemia    Anxiety    Cervical cancer (HCC)     Chronic bronchitis (HCC)    "get it q yr"   Chronic lower back pain    Depression    Febrile seizure (HCC)    "as a child"   Fibromyalgia    GERD (gastroesophageal reflux disease)    Gout    Hemodialysis patient (HCC)    History of blood transfusion    "S/P tonsillectomy"   History of hiatal hernia    Hx of cardiovascular stress test 05/14/2014   false positive Myoview   Hyperlipidemia    Hypertension    Migraine    "monthly" (05/26/2014)   Neuropathy    Osteoporosis    Parathyroid disease (HCC)    "my PHT levels run high; I can't take calcium"   Pneumonia "several times"   Renal cancer (HCC)    "right"-s/p nephrectomy   Renal insufficiency    "left kidney works at 40-60%" (05/26/2014)   Sleep apnea    "wore mask for 2 months; could not take it" (05/26/2014)   Type II diabetes mellitus (HCC)    Typhus fever    "as a child"     Allergies  Allergen Reactions   Demerol [Meperidine] Other (See Comments)    Stop breathing   Lyrica [Pregabalin] Swelling    Stomach lesion   Vicodin [  Hydrocodone-Acetaminophen] Other (See Comments)    Stop breathing   Ace Inhibitors Cough   Aleve [Naproxen] Other (See Comments)    stomach lesion   Codeine Other (See Comments)    hallucination, confused   Contrast Media [Iodinated Contrast Media] Nausea Only and Other (See Comments)    MRI dye. Shaky, nausea   Fentanyl Itching   Gluten Meal Diarrhea    GI-upset   Lactose Intolerance (Gi) Other (See Comments)    Gi upset   Morphine Nausea And Vomiting   Neurontin [Gabapentin] Nausea Only, Swelling and Other (See Comments)    Confusion. Felt like in a dream. foggy   Norvasc [Amlodipine] Swelling   Nsaids Other (See Comments)    Severe chronic kidney disease   Other Other (See Comments)    Anesthesia--prolonged sleep inertia (grogginess/disorientation/cognitive impairment)   Dilaudid [Hydromorphone Hcl] Itching, Nausea And Vomiting and Rash     Current Outpatient Medications   Medication Sig Dispense Refill   acetaminophen (TYLENOL) 500 MG tablet Take 1,000 mg by mouth every 6 (six) hours as needed (for pain.).     allopurinol (ZYLOPRIM) 300 MG tablet Take 300 mg by mouth in the morning.  12   ALPRAZolam (XANAX) 0.25 MG tablet Take 0.25 mg by mouth every Monday, Wednesday, and Friday with hemodialysis.     aspirin EC 81 MG tablet Take 81 mg by mouth in the morning. Swallow whole.     atenolol (TENORMIN) 50 MG tablet Take 0.5 tablets (25 mg total) by mouth 2 (two) times daily. 90 tablet 1   calcitRIOL (ROCALTROL) 0.25 MCG capsule Take 0.25 mcg by mouth in the morning.     Cholecalciferol (VITAMIN D3) 250 MCG (10000 UT) TABS Take 10,000 Units by mouth in the morning.     docusate sodium (COLACE) 50 MG capsule Take 50 mg by mouth at bedtime.     DULoxetine (CYMBALTA) 60 MG capsule Take 60 mg by mouth in the morning.     furosemide (LASIX) 40 MG tablet Take 40 mg by mouth in the morning. 40 mg in the evening Monday- Friday, 40 mg twice daily on Saturday & Sundays.     gabapentin (NEURONTIN) 100 MG capsule Take 100 mg by mouth daily.     hyoscyamine (LEVSIN) 0.125 MG tablet Take 1 tablet (0.125 mg total) by mouth every 8 (eight) hours as needed (abdominal pain). 60 tablet 1   isosorbide dinitrate (ISORDIL) 10 MG tablet Take 1 tablet by mouth twice daily 180 tablet 2   NITROSTAT 0.4 MG SL tablet Place 1 tablet (0.4 mg total) under the tongue every 5 (five) minutes x 3 doses as needed. 25 tablet 3   ondansetron (ZOFRAN-ODT) 4 MG disintegrating tablet Take 1 tablet (4 mg total) by mouth every 8 (eight) hours as needed for nausea or vomiting. 5 tablet 0   rosuvastatin (CRESTOR) 10 MG tablet Take 1 tablet (10 mg total) by mouth daily.     sevelamer carbonate (RENVELA) 800 MG tablet Take 800 mg by mouth 3 (three) times daily.     sodium bicarbonate 650 MG tablet Take 650 mg by mouth 2 (two) times daily.     No current facility-administered medications for this visit.      Past Surgical History:  Procedure Laterality Date   ABDOMINAL HERNIA REPAIR  02/2013   "in [place]"   ABDOMINAL HYSTERECTOMY  1977   "partial"   APPENDECTOMY     AV FISTULA PLACEMENT Left 06/13/2021   Procedure: LEFT ARM ARTERIOVENOUS  FISTULA CREATION;  Surgeon: Larina Earthly, MD;  Location: [facility];  Service: Vascular;  Laterality: Left;   BACK SURGERY     BILATERAL SALPINGOOPHORECTOMY Bilateral ~ 1993   BIOPSY  10/16/2022   Procedure: BIOPSY;  Surgeon: Dolores Frame, MD;  Location: [facility];  Service: Gastroenterology;;   CARDIAC CATHETERIZATION  2003, April 2016   normal coronaries   CATARACT EXTRACTION W/ INTRAOCULAR LENS  IMPLANT, BILATERAL  2015   COLONOSCOPY N/A 11/25/2014   Procedure: COLONOSCOPY;  Surgeon: Malissa Hippo, MD;  Location: [facility];  Service: Endoscopy;  Laterality: N/A;   COLONOSCOPY WITH PROPOFOL N/A 12/12/2021   Procedure: COLONOSCOPY WITH PROPOFOL;  Surgeon: Dolores Frame, MD;  Location: [facility];  Service: Gastroenterology;  Laterality: N/A;  130 ASA 3 patient has dialysis Mon Wed & Fri, per Soledad Gerlach pt knows new arrival time   CYSTOSCOPY W/ [place] MANIPULATION  1988   DIAGNOSTIC LAPAROSCOPY  1975   DILATION AND CURETTAGE OF UTERUS     ESOPHAGOGASTRODUODENOSCOPY N/A 11/25/2014   Procedure: ESOPHAGOGASTRODUODENOSCOPY (EGD);  Surgeon: Malissa Hippo, MD;  Location: [facility];  Service: Endoscopy;  Laterality: N/A;  9:30 - moved to 11:25 - [name] to notify pt   ESOPHAGOGASTRODUODENOSCOPY (EGD) WITH ESOPHAGEAL DILATION  1997   ESOPHAGOGASTRODUODENOSCOPY (EGD) WITH PROPOFOL N/A 10/16/2022   Procedure: ESOPHAGOGASTRODUODENOSCOPY (EGD) WITH PROPOFOL;  Surgeon: Dolores Frame, MD;  Location: [facility];  Service: Gastroenterology;  Laterality: N/A;  9:30am;asa 3   EYE SURGERY     GASTRIC BYPASS  2012   HERNIA REPAIR     2015   HIATAL HERNIA REPAIR  1996   INCONTINENCE SURGERY  1983   INSERTION OF DIALYSIS  CATHETER Right 09/19/2021   Procedure: INSERTION OF TUNNELED DIALYSIS CATHETER;  Surgeon: Larina Earthly, MD;  Location: [facility];  Service: Vascular;  Laterality: Right;   KNEE ARTHROSCOPY Left 2000's   LAPAROSCOPIC CHOLECYSTECTOMY  1990's   LEFT HEART CATHETERIZATION WITH CORONARY ANGIOGRAM N/A 05/27/2014   Procedure: LEFT HEART CATHETERIZATION WITH CORONARY ANGIOGRAM;  Surgeon: Runell Gess, MD;  Location: [facility] C[facility];  Service: Cardiovascular;  Laterality: N/A;   LIGATION OF ARTERIOVENOUS  FISTULA Left 09/19/2021   Procedure: LIGATION OF ARTERIOVENOUS  FISTULA;  Surgeon: Larina Earthly, MD;  Location: [facility];  Service: Vascular;  Laterality: Left;   LUMBAR DISC SURGERY  2001   "herniated discs"   MOUTH SURGERY  1991   "drilled into gum and put tooth implants uppers"   NEPHRECTOMY Right 2002   "cancer"   POLYPECTOMY  12/12/2021   Procedure: POLYPECTOMY;  Surgeon: Dolores Frame, MD;  Location: [facility];  Service: Gastroenterology;;   TMJ ARTHROPLASTY  1988   TONSILLECTOMY  1976   UPPER GI ENDOSCOPY       Allergies  Allergen Reactions   Demerol [Meperidine] Other (See Comments)    Stop breathing   Lyrica [Pregabalin] Swelling    Stomach lesion   Vicodin [Hydrocodone-Acetaminophen] Other (See Comments)    Stop breathing   Ace Inhibitors Cough   Aleve [Naproxen] Other (See Comments)    stomach lesion   Codeine Other (See Comments)    hallucination, confused   Contrast Media [Iodinated Contrast Media] Nausea Only and Other (See Comments)    MRI dye. Shaky, nausea   Fentanyl Itching   Gluten Meal Diarrhea    GI-upset   Lactose Intolerance (Gi) Other (See Comments)    Gi upset  Morphine Nausea And Vomiting   Neurontin [Gabapentin] Nausea Only, Swelling and Other (See Comments)    Confusion. Felt like in a dream. foggy   Norvasc [Amlodipine] Swelling   Nsaids Other (See Comments)    Severe chronic kidney disease   Other Other (See Comments)     Anesthesia--prolonged sleep inertia (grogginess/disorientation/cognitive impairment)   Dilaudid [Hydromorphone Hcl] Itching, Nausea And Vomiting and Rash      Family History  Problem Relation Age of Onset   Emphysema Father    Heart disease Father    Clotting disorder Mother    Diabetes Mother    Kidney disease Mother    Allergies Other        whole family per pt   Lung cancer Maternal Uncle    Alcohol abuse Maternal Uncle      Social History Margaret Huerta reports that she has never smoked. She has never been exposed to tobacco smoke. She has never used smokeless tobacco. Margaret Huerta reports no history of alcohol use.   Review of Systems CONSTITUTIONAL: No weight loss, fever, chills, weakness or fatigue.  HEENT: Eyes: No visual loss, blurred vision, double vision or yellow sclerae.No hearing loss, sneezing, congestion, runny nose or sore throat.  SKIN: No rash or itching.  CARDIOVASCULAR: per hpi RESPIRATORY: No shortness of breath, cough or sputum.  GASTROINTESTINAL: No anorexia, nausea, vomiting or diarrhea. No abdominal pain or blood.  GENITOURINARY: No burning on urination, no polyuria NEUROLOGICAL: No headache, dizziness, syncope, paralysis, ataxia, numbness or tingling in the extremities. No change in bowel or bladder control.  MUSCULOSKELETAL: No muscle, back pain, joint pain or stiffness.  LYMPHATICS: No enlarged nodes. No history of splenectomy.  PSYCHIATRIC: No history of depression or anxiety.  ENDOCRINOLOGIC: No reports of sweating, cold or heat intolerance. No polyuria or polydipsia.  Marland Kitchen   Physical Examination Today's Vitals   12/11/22 1346  BP: 112/80  Pulse: (!) 52  SpO2: 97%  Weight: 168 lb 12.8 oz (76.6 kg)  Height: 5\' 2"  (1.575 m)   Body mass index is 30.87 kg/m.  Gen: resting comfortably, no acute distress HEENT: no scleral icterus, pupils equal round and reactive, no palptable cervical adenopathy,  CV: RRR, no m/rg, no jvd Resp: Clear to  auscultation bilaterally GI: abdomen is soft, non-tender, non-distended, normal bowel sounds, no hepatosplenomegaly MSK: extremities are warm, no edema.  Skin: warm, no rash Neuro:  no focal deficits Psych: appropriate affect   Diagnostic Studies  Nuclear stress test 08/05/2019 There was no ST segment deviation noted during stress. Findings consistent with mild apical ischemia. This is a low risk study. The left ventricular ejection fraction is hyperdynamic (>65%).   Echo 08/05/19 . Left ventricular ejection fraction, by estimation, is 60 to 65%. The  left ventricle has normal function. The left ventricle has no regional  wall motion abnormalities. There is mild left ventricular hypertrophy.  Left ventricular diastolic parameters  are consistent with Grade I diastolic dysfunction (impaired relaxation).   2. Right ventricular systolic function is normal. The right ventricular  size is normal. There is mildly elevated pulmonary artery systolic  pressure.   3. Left atrial size was severely dilated.   4. Right atrial size was mildly dilated.   5. The mitral valve is normal in structure. Mild mitral valve  regurgitation. No evidence of mitral stenosis.   6. The aortic valve is tricuspid. Aortic valve regurgitation is not  visualized. No aortic stenosis is present.   7. The inferior vena cava is  normal in size with greater than 50%  respiratory variability, suggesting right atrial pressure of 3 mmHg.   8. Left to right atrial shunting noted by color Doppler, probable PFO.      11/2019 renal artery [place] Summary:  Largest Aortic Diameter: 2.2 cm     Renal:     Right: Right kidney removed 2002.  Left:  Normal size of left kidney. Abnormal left Resisitve Index.         Normal cortical thickness of the left kidney. No evidence of         left renal artery stenosis. LRV flow present.  Mesenteric:  Normal Celiac artery and Superior Mesenteric artery findings.    Assessment and Plan    1. Chest pain - long history of symptoms with overall benign ischemic testing, most recently 07/2019 -has improved in general on nitrates.  - we will continue current meds - repeat echo for ongoing symptoms   2. HTN -ongoing issues with low bp's and orthostatic falls, particularly after HD -has vitals from HD which including sitting and standing bp's, SBPs drop 20-30 points with standing - already stopped hydralazine. Needs low dose atenolol for her migraines she reports, on nitrates for chronic chest pains. Stop her lasix on non HD days, work on increasing oral hydration.    3. Hyperlipidemia - at goal, continue current meds   4.OSA - followed by Dr Vassie Loll  5 Elevated LFTs - liver biopsy suggestive congestive hepatopathy - fairly benign echo 2021, will repeat study.    F/u 6 months  Antoine Poche, M.D.

## 2022-12-11 NOTE — Telephone Encounter (Signed)
Checking percert on the following    ECHO 12-13-2022    [facility]

## 2022-12-12 ENCOUNTER — Ambulatory Visit: Payer: Medicare Other | Attending: Cardiology

## 2022-12-12 DIAGNOSIS — R0789 Other chest pain: Secondary | ICD-10-CM | POA: Diagnosis not present

## 2022-12-12 DIAGNOSIS — H26491 Other secondary cataract, right eye: Secondary | ICD-10-CM | POA: Diagnosis not present

## 2022-12-12 DIAGNOSIS — H10413 Chronic giant papillary conjunctivitis, bilateral: Secondary | ICD-10-CM | POA: Diagnosis not present

## 2022-12-12 DIAGNOSIS — Z961 Presence of intraocular lens: Secondary | ICD-10-CM | POA: Diagnosis not present

## 2022-12-12 DIAGNOSIS — H43812 Vitreous degeneration, left eye: Secondary | ICD-10-CM | POA: Diagnosis not present

## 2022-12-12 DIAGNOSIS — E113293 Type 2 diabetes mellitus with mild nonproliferative diabetic retinopathy without macular edema, bilateral: Secondary | ICD-10-CM | POA: Diagnosis not present

## 2022-12-12 LAB — ECHOCARDIOGRAM COMPLETE
AR max vel: 1.99 cm2
AV Area VTI: 1.92 cm2
AV Area mean vel: 1.79 cm2
AV Mean grad: 4 mm[Hg]
AV Peak grad: 6.3 mm[Hg]
Ao pk vel: 1.25 m/s
Area-P 1/2: 2.5 cm2
Calc EF: 64.1 %
MV VTI: 2.35 cm2
S' Lateral: 2.6 cm
Single Plane A2C EF: 56.7 %
Single Plane A4C EF: 68.3 %

## 2022-12-13 DIAGNOSIS — Z992 Dependence on renal dialysis: Secondary | ICD-10-CM | POA: Diagnosis not present

## 2022-12-13 DIAGNOSIS — E1140 Type 2 diabetes mellitus with diabetic neuropathy, unspecified: Secondary | ICD-10-CM | POA: Diagnosis not present

## 2022-12-13 DIAGNOSIS — N2581 Secondary hyperparathyroidism of renal origin: Secondary | ICD-10-CM | POA: Diagnosis not present

## 2022-12-13 DIAGNOSIS — E8770 Fluid overload, unspecified: Secondary | ICD-10-CM | POA: Diagnosis not present

## 2022-12-13 DIAGNOSIS — D649 Anemia, unspecified: Secondary | ICD-10-CM | POA: Diagnosis not present

## 2022-12-13 DIAGNOSIS — E119 Type 2 diabetes mellitus without complications: Secondary | ICD-10-CM | POA: Diagnosis not present

## 2022-12-13 DIAGNOSIS — D631 Anemia in chronic kidney disease: Secondary | ICD-10-CM | POA: Diagnosis not present

## 2022-12-13 DIAGNOSIS — N186 End stage renal disease: Secondary | ICD-10-CM | POA: Diagnosis not present

## 2022-12-13 DIAGNOSIS — Z23 Encounter for immunization: Secondary | ICD-10-CM | POA: Diagnosis not present

## 2022-12-13 DIAGNOSIS — D689 Coagulation defect, unspecified: Secondary | ICD-10-CM | POA: Diagnosis not present

## 2022-12-14 DIAGNOSIS — E8770 Fluid overload, unspecified: Secondary | ICD-10-CM | POA: Diagnosis not present

## 2022-12-14 DIAGNOSIS — D689 Coagulation defect, unspecified: Secondary | ICD-10-CM | POA: Diagnosis not present

## 2022-12-14 DIAGNOSIS — E1140 Type 2 diabetes mellitus with diabetic neuropathy, unspecified: Secondary | ICD-10-CM | POA: Diagnosis not present

## 2022-12-14 DIAGNOSIS — N2581 Secondary hyperparathyroidism of renal origin: Secondary | ICD-10-CM | POA: Diagnosis not present

## 2022-12-14 DIAGNOSIS — Z992 Dependence on renal dialysis: Secondary | ICD-10-CM | POA: Diagnosis not present

## 2022-12-14 DIAGNOSIS — N186 End stage renal disease: Secondary | ICD-10-CM | POA: Diagnosis not present

## 2022-12-14 DIAGNOSIS — N281 Cyst of kidney, acquired: Secondary | ICD-10-CM | POA: Diagnosis not present

## 2022-12-14 DIAGNOSIS — Z23 Encounter for immunization: Secondary | ICD-10-CM | POA: Diagnosis not present

## 2022-12-14 DIAGNOSIS — D631 Anemia in chronic kidney disease: Secondary | ICD-10-CM | POA: Diagnosis not present

## 2022-12-14 DIAGNOSIS — E119 Type 2 diabetes mellitus without complications: Secondary | ICD-10-CM | POA: Diagnosis not present

## 2022-12-14 DIAGNOSIS — D649 Anemia, unspecified: Secondary | ICD-10-CM | POA: Diagnosis not present

## 2022-12-17 DIAGNOSIS — E119 Type 2 diabetes mellitus without complications: Secondary | ICD-10-CM | POA: Diagnosis not present

## 2022-12-17 DIAGNOSIS — N186 End stage renal disease: Secondary | ICD-10-CM | POA: Diagnosis not present

## 2022-12-17 DIAGNOSIS — E8770 Fluid overload, unspecified: Secondary | ICD-10-CM | POA: Diagnosis not present

## 2022-12-17 DIAGNOSIS — Z992 Dependence on renal dialysis: Secondary | ICD-10-CM | POA: Diagnosis not present

## 2022-12-17 DIAGNOSIS — D689 Coagulation defect, unspecified: Secondary | ICD-10-CM | POA: Diagnosis not present

## 2022-12-17 DIAGNOSIS — D631 Anemia in chronic kidney disease: Secondary | ICD-10-CM | POA: Diagnosis not present

## 2022-12-17 DIAGNOSIS — N2581 Secondary hyperparathyroidism of renal origin: Secondary | ICD-10-CM | POA: Diagnosis not present

## 2022-12-17 DIAGNOSIS — D649 Anemia, unspecified: Secondary | ICD-10-CM | POA: Diagnosis not present

## 2022-12-17 DIAGNOSIS — Z23 Encounter for immunization: Secondary | ICD-10-CM | POA: Diagnosis not present

## 2022-12-17 DIAGNOSIS — E1140 Type 2 diabetes mellitus with diabetic neuropathy, unspecified: Secondary | ICD-10-CM | POA: Diagnosis not present

## 2022-12-18 DIAGNOSIS — N186 End stage renal disease: Secondary | ICD-10-CM | POA: Diagnosis not present

## 2022-12-18 DIAGNOSIS — N2581 Secondary hyperparathyroidism of renal origin: Secondary | ICD-10-CM | POA: Diagnosis not present

## 2022-12-18 DIAGNOSIS — D631 Anemia in chronic kidney disease: Secondary | ICD-10-CM | POA: Diagnosis not present

## 2022-12-18 DIAGNOSIS — D689 Coagulation defect, unspecified: Secondary | ICD-10-CM | POA: Diagnosis not present

## 2022-12-18 DIAGNOSIS — E119 Type 2 diabetes mellitus without complications: Secondary | ICD-10-CM | POA: Diagnosis not present

## 2022-12-18 DIAGNOSIS — Z992 Dependence on renal dialysis: Secondary | ICD-10-CM | POA: Diagnosis not present

## 2022-12-18 DIAGNOSIS — Z23 Encounter for immunization: Secondary | ICD-10-CM | POA: Diagnosis not present

## 2022-12-18 DIAGNOSIS — E1140 Type 2 diabetes mellitus with diabetic neuropathy, unspecified: Secondary | ICD-10-CM | POA: Diagnosis not present

## 2022-12-18 DIAGNOSIS — E8770 Fluid overload, unspecified: Secondary | ICD-10-CM | POA: Diagnosis not present

## 2022-12-18 DIAGNOSIS — D649 Anemia, unspecified: Secondary | ICD-10-CM | POA: Diagnosis not present

## 2022-12-19 ENCOUNTER — Other Ambulatory Visit (HOSPITAL_COMMUNITY): Payer: Self-pay | Admitting: Internal Medicine

## 2022-12-19 DIAGNOSIS — Z992 Dependence on renal dialysis: Secondary | ICD-10-CM | POA: Diagnosis not present

## 2022-12-19 DIAGNOSIS — Z23 Encounter for immunization: Secondary | ICD-10-CM | POA: Diagnosis not present

## 2022-12-19 DIAGNOSIS — D649 Anemia, unspecified: Secondary | ICD-10-CM | POA: Diagnosis not present

## 2022-12-19 DIAGNOSIS — E8770 Fluid overload, unspecified: Secondary | ICD-10-CM | POA: Diagnosis not present

## 2022-12-19 DIAGNOSIS — E1140 Type 2 diabetes mellitus with diabetic neuropathy, unspecified: Secondary | ICD-10-CM | POA: Diagnosis not present

## 2022-12-19 DIAGNOSIS — Z1231 Encounter for screening mammogram for malignant neoplasm of breast: Secondary | ICD-10-CM

## 2022-12-19 DIAGNOSIS — D631 Anemia in chronic kidney disease: Secondary | ICD-10-CM | POA: Diagnosis not present

## 2022-12-19 DIAGNOSIS — N186 End stage renal disease: Secondary | ICD-10-CM | POA: Diagnosis not present

## 2022-12-19 DIAGNOSIS — E119 Type 2 diabetes mellitus without complications: Secondary | ICD-10-CM | POA: Diagnosis not present

## 2022-12-19 DIAGNOSIS — D689 Coagulation defect, unspecified: Secondary | ICD-10-CM | POA: Diagnosis not present

## 2022-12-19 DIAGNOSIS — N2581 Secondary hyperparathyroidism of renal origin: Secondary | ICD-10-CM | POA: Diagnosis not present

## 2022-12-20 DIAGNOSIS — N2581 Secondary hyperparathyroidism of renal origin: Secondary | ICD-10-CM | POA: Diagnosis not present

## 2022-12-20 DIAGNOSIS — N186 End stage renal disease: Secondary | ICD-10-CM | POA: Diagnosis not present

## 2022-12-20 DIAGNOSIS — E1140 Type 2 diabetes mellitus with diabetic neuropathy, unspecified: Secondary | ICD-10-CM | POA: Diagnosis not present

## 2022-12-20 DIAGNOSIS — D649 Anemia, unspecified: Secondary | ICD-10-CM | POA: Diagnosis not present

## 2022-12-20 DIAGNOSIS — E119 Type 2 diabetes mellitus without complications: Secondary | ICD-10-CM | POA: Diagnosis not present

## 2022-12-20 DIAGNOSIS — D631 Anemia in chronic kidney disease: Secondary | ICD-10-CM | POA: Diagnosis not present

## 2022-12-20 DIAGNOSIS — D689 Coagulation defect, unspecified: Secondary | ICD-10-CM | POA: Diagnosis not present

## 2022-12-20 DIAGNOSIS — Z23 Encounter for immunization: Secondary | ICD-10-CM | POA: Diagnosis not present

## 2022-12-20 DIAGNOSIS — Z992 Dependence on renal dialysis: Secondary | ICD-10-CM | POA: Diagnosis not present

## 2022-12-20 DIAGNOSIS — E8770 Fluid overload, unspecified: Secondary | ICD-10-CM | POA: Diagnosis not present

## 2022-12-21 DIAGNOSIS — D631 Anemia in chronic kidney disease: Secondary | ICD-10-CM | POA: Diagnosis not present

## 2022-12-21 DIAGNOSIS — D689 Coagulation defect, unspecified: Secondary | ICD-10-CM | POA: Diagnosis not present

## 2022-12-21 DIAGNOSIS — E119 Type 2 diabetes mellitus without complications: Secondary | ICD-10-CM | POA: Diagnosis not present

## 2022-12-21 DIAGNOSIS — Z992 Dependence on renal dialysis: Secondary | ICD-10-CM | POA: Diagnosis not present

## 2022-12-21 DIAGNOSIS — D649 Anemia, unspecified: Secondary | ICD-10-CM | POA: Diagnosis not present

## 2022-12-21 DIAGNOSIS — N2581 Secondary hyperparathyroidism of renal origin: Secondary | ICD-10-CM | POA: Diagnosis not present

## 2022-12-21 DIAGNOSIS — Z23 Encounter for immunization: Secondary | ICD-10-CM | POA: Diagnosis not present

## 2022-12-21 DIAGNOSIS — N186 End stage renal disease: Secondary | ICD-10-CM | POA: Diagnosis not present

## 2022-12-21 DIAGNOSIS — E8770 Fluid overload, unspecified: Secondary | ICD-10-CM | POA: Diagnosis not present

## 2022-12-21 DIAGNOSIS — E1140 Type 2 diabetes mellitus with diabetic neuropathy, unspecified: Secondary | ICD-10-CM | POA: Diagnosis not present

## 2022-12-24 DIAGNOSIS — D649 Anemia, unspecified: Secondary | ICD-10-CM | POA: Diagnosis not present

## 2022-12-24 DIAGNOSIS — E119 Type 2 diabetes mellitus without complications: Secondary | ICD-10-CM | POA: Diagnosis not present

## 2022-12-24 DIAGNOSIS — E8770 Fluid overload, unspecified: Secondary | ICD-10-CM | POA: Diagnosis not present

## 2022-12-24 DIAGNOSIS — D689 Coagulation defect, unspecified: Secondary | ICD-10-CM | POA: Diagnosis not present

## 2022-12-24 DIAGNOSIS — Z992 Dependence on renal dialysis: Secondary | ICD-10-CM | POA: Diagnosis not present

## 2022-12-24 DIAGNOSIS — Z23 Encounter for immunization: Secondary | ICD-10-CM | POA: Diagnosis not present

## 2022-12-24 DIAGNOSIS — D631 Anemia in chronic kidney disease: Secondary | ICD-10-CM | POA: Diagnosis not present

## 2022-12-24 DIAGNOSIS — E1140 Type 2 diabetes mellitus with diabetic neuropathy, unspecified: Secondary | ICD-10-CM | POA: Diagnosis not present

## 2022-12-24 DIAGNOSIS — N2581 Secondary hyperparathyroidism of renal origin: Secondary | ICD-10-CM | POA: Diagnosis not present

## 2022-12-24 DIAGNOSIS — N186 End stage renal disease: Secondary | ICD-10-CM | POA: Diagnosis not present

## 2022-12-25 ENCOUNTER — Encounter (INDEPENDENT_AMBULATORY_CARE_PROVIDER_SITE_OTHER): Payer: Self-pay | Admitting: Gastroenterology

## 2022-12-25 ENCOUNTER — Ambulatory Visit (INDEPENDENT_AMBULATORY_CARE_PROVIDER_SITE_OTHER): Payer: Medicare Other | Admitting: Gastroenterology

## 2022-12-25 VITALS — BP 106/70 | HR 59 | Temp 97.9°F | Ht 62.0 in | Wt 163.6 lb

## 2022-12-25 DIAGNOSIS — E119 Type 2 diabetes mellitus without complications: Secondary | ICD-10-CM | POA: Diagnosis not present

## 2022-12-25 DIAGNOSIS — N2581 Secondary hyperparathyroidism of renal origin: Secondary | ICD-10-CM | POA: Diagnosis not present

## 2022-12-25 DIAGNOSIS — Z992 Dependence on renal dialysis: Secondary | ICD-10-CM | POA: Diagnosis not present

## 2022-12-25 DIAGNOSIS — R112 Nausea with vomiting, unspecified: Secondary | ICD-10-CM

## 2022-12-25 DIAGNOSIS — R1012 Left upper quadrant pain: Secondary | ICD-10-CM | POA: Diagnosis not present

## 2022-12-25 DIAGNOSIS — R1013 Epigastric pain: Secondary | ICD-10-CM | POA: Diagnosis not present

## 2022-12-25 DIAGNOSIS — R140 Abdominal distension (gaseous): Secondary | ICD-10-CM

## 2022-12-25 DIAGNOSIS — E8770 Fluid overload, unspecified: Secondary | ICD-10-CM | POA: Diagnosis not present

## 2022-12-25 DIAGNOSIS — R6881 Early satiety: Secondary | ICD-10-CM | POA: Diagnosis not present

## 2022-12-25 DIAGNOSIS — R7989 Other specified abnormal findings of blood chemistry: Secondary | ICD-10-CM | POA: Diagnosis not present

## 2022-12-25 DIAGNOSIS — N186 End stage renal disease: Secondary | ICD-10-CM | POA: Diagnosis not present

## 2022-12-25 DIAGNOSIS — K761 Chronic passive congestion of liver: Secondary | ICD-10-CM

## 2022-12-25 DIAGNOSIS — E1140 Type 2 diabetes mellitus with diabetic neuropathy, unspecified: Secondary | ICD-10-CM | POA: Diagnosis not present

## 2022-12-25 DIAGNOSIS — R109 Unspecified abdominal pain: Secondary | ICD-10-CM

## 2022-12-25 DIAGNOSIS — D649 Anemia, unspecified: Secondary | ICD-10-CM | POA: Diagnosis not present

## 2022-12-25 DIAGNOSIS — R110 Nausea: Secondary | ICD-10-CM

## 2022-12-25 DIAGNOSIS — D689 Coagulation defect, unspecified: Secondary | ICD-10-CM | POA: Diagnosis not present

## 2022-12-25 DIAGNOSIS — D631 Anemia in chronic kidney disease: Secondary | ICD-10-CM | POA: Diagnosis not present

## 2022-12-25 DIAGNOSIS — Z23 Encounter for immunization: Secondary | ICD-10-CM | POA: Diagnosis not present

## 2022-12-25 NOTE — Patient Instructions (Signed)
Please continue with levsin as needed for abdominal pain As discussed, I suspect your left abdominal and flank pain may be related to your kidney vs. The GI tract as you have had thorough evaluation of your GI tract/abdomen. Please follow up with nephrology regarding any further testing they may recommend If you continue to have nausea or decide you would like to do the gastric emptying study, please let me know  Follow up 4 months

## 2022-12-25 NOTE — Progress Notes (Addendum)
Referring Provider: Carylon Perches, MD Primary Care Physician:  Carylon Perches, MD Primary GI Physician: Dr. Levon Hedger   Chief Complaint  Patient presents with   Abdominal Pain    Follow up on abdominal pain. States hysocamine helped with sharp pain but not radiating pain in abdomen. Has nausea all the time. Sometimes vomits. Ran out of hysocamine and needs refill.    HPI:   Margaret Huerta is a 77 y.o. 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  Having some left upper quadrant pain which radiates to her back.  Notes abdomen feels more swollen, endorses some nausea with some vomiting occasionally.  Denies GERD symptoms.  Previously on PPI but stopped in June as it did not improve her symptoms.  Having soft stools within about 30 minutes of eating.  Sometimes having liquid bowel movements.  Recommended further liver serologies, iron studies, right upper quadrant ultrasound elastography, schedule EGD.  [name] FibroSure indicated F1-F2 fibrosis, ultrasound elastography with K PA 3.7  ANA, AMA elevated patient was recommended to have liver biopsy performed.  Liver biopsy on 11/28/2022 revealed congestive hepatopathy with central zone fibrosis without advanced fibrosis top of the likely in setting of heart failure and renal disease  Ferritin was 350, she does receive iron injections at dialysis, hemochromatosis testing was done which was negative  EGD normal, patient provided levsin for abdominal discomfort  Present:  Patient states that she continues to have some bloating in her abdomen. She is nauseated  often and sometimes throws up. She notes that levsin helps with the sharp pain in her epigastric region but she is still having LUQ pain that radiates into her back sometimes. She notes her weight seems to be slowly trending down. Notes she was around 190 lbs January 2023, she is 163 pounds today though reports she was 152 lbs at her heart doctor appt 2 weeks ago. Has used mylanta for nausea on occasion but does not do much for her.  Appetite is not good. She does not feel hungry. Often feels full even when eating a small amount of food. A1c has been <7 for a while. CT A/P in may was unremarkable. She is doing some plant based protein powder as she was told she does not consume enough protein. She notes she is also having headaches almost daily. History of migraines, atenolol has helped in the past but due to hypotension the dose of this keeps getting decreased.   She notes that she has stools usually within 15-30 minutes after a meal. She does have some fecal urgency at times. She notes stools are usually pale in color. She notes that she was previously taking colace but was switched to miralax which she feels "messed her up" not currently taking anything for constipation at this time.   Last labs in October with AP 128, AST 108, ALT 89  EGD: 10/2022 - Normal esophagus.                           - Gastric bypass with a pouch 5 cm in length and  intact staple line. Gastrojejunal anastomosis                            characterized by healthy appearing mucosa. Biopsied.                           - Normal examined jejunum. (Negative biopsies)  Colonoscopy October 2023: - two 8 mm polyp from transverse colon and cecum -8 mm polyp in sigmoid colon -Sigmoid and transverse diverticulosis -Significant looping of the colon -Nonbleeding internal hemorrhoids -Pathology revealed tubular adenomas  -Recommended repeat TCS in 5 years.   Past Medical History:  Diagnosis Date    Anemia    Anxiety    Cervical cancer (HCC)    Chronic bronchitis (HCC)    "get it q yr"   Chronic lower back pain    Depression    Febrile seizure (HCC)    "as a child"   Fibromyalgia    GERD (gastroesophageal reflux disease)    Gout    Hemodialysis patient (HCC)    History of blood transfusion    "S/P tonsillectomy"   History of hiatal hernia    Hx of cardiovascular stress test 05/14/2014   false positive Myoview   Hyperlipidemia    Hypertension    Migraine    "monthly" (05/26/2014)   Neuropathy    Osteoporosis    Parathyroid disease (HCC)    "my PHT levels run high; I can't take calcium"   Pneumonia "several times"   Renal cancer (HCC)    "right"-s/p nephrectomy   Renal insufficiency    "left kidney works at 40-60%" (05/26/2014)   Sleep apnea    "wore mask for 2 months; could not take it" (05/26/2014)   Type II diabetes mellitus (HCC)    Typhus fever    "as a child"    Past Surgical History:  Procedure Laterality Date   ABDOMINAL HERNIA REPAIR  02/2013   "in [place]"   ABDOMINAL HYSTERECTOMY  1977   "partial"   APPENDECTOMY     AV FISTULA PLACEMENT Left 06/13/2021   Procedure: LEFT ARM ARTERIOVENOUS FISTULA CREATION;  Surgeon: Larina Earthly, MD;  Location: [facility];  Service: Vascular;  Laterality: Left;   BACK SURGERY     BILATERAL SALPINGOOPHORECTOMY Bilateral ~ 1993   BIOPSY  10/16/2022   Procedure: BIOPSY;  Surgeon: Dolores Frame, MD;  Location: [facility];  Service: Gastroenterology;;   CARDIAC CATHETERIZATION  2003, April 2016   normal coronaries   CATARACT EXTRACTION W/ INTRAOCULAR LENS  IMPLANT, BILATERAL  2015   COLONOSCOPY N/A 11/25/2014   Procedure: COLONOSCOPY;  Surgeon: Malissa Hippo, MD;  Location: [facility];  Service: Endoscopy;  Laterality: N/A;   COLONOSCOPY WITH PROPOFOL N/A 12/12/2021   Procedure: COLONOSCOPY WITH PROPOFOL;  Surgeon: Dolores Frame, MD;  [facility]: [facility];  Service: Gastroenterology;   Laterality: N/A;  130 ASA 3 patient has dialysis Mon Wed & Fri, per Soledad Gerlach pt knows new arrival time   CYSTOSCOPY W/ Drennen MANIPULATION  1988   DIAGNOSTIC LAPAROSCOPY  1975   DILATION AND CURETTAGE OF UTERUS     ESOPHAGOGASTRODUODENOSCOPY N/A 11/25/2014   Procedure: ESOPHAGOGASTRODUODENOSCOPY (EGD);  Surgeon: Malissa Hippo, MD;  [facility];  Service: Endoscopy;  Laterality: N/A;  9:30 - moved to 11:25 - [name] to notify pt   ESOPHAGOGASTRODUODENOSCOPY (EGD) WITH ESOPHAGEAL DILATION  1997  ESOPHAGOGASTRODUODENOSCOPY (EGD) WITH PROPOFOL N/A 10/16/2022   Procedure: ESOPHAGOGASTRODUODENOSCOPY (EGD) WITH PROPOFOL;  Surgeon: Dolores Frame, MD;  [facility];  Service: Gastroenterology;  Laterality: N/A;  9:30am;asa 3   EYE SURGERY     GASTRIC BYPASS  2012   HERNIA REPAIR     2015   HIATAL HERNIA REPAIR  1996   INCONTINENCE SURGERY  1983   INSERTION OF DIALYSIS CATHETER Right 09/19/2021   Procedure: INSERTION OF TUNNELED DIALYSIS CATHETER;  Surgeon: Larina Earthly, MD;  Location: [facility];  Service: Vascular;  Laterality: Right;   KNEE ARTHROSCOPY Left 2000's   LAPAROSCOPIC CHOLECYSTECTOMY  1990's   LEFT HEART CATHETERIZATION WITH CORONARY ANGIOGRAM N/A 05/27/2014   Procedure: LEFT HEART CATHETERIZATION WITH CORONARY ANGIOGRAM;  Surgeon: Runell Gess, MD;  Location: [facility] C[facility];  Service: Cardiovascular;  Laterality: N/A;   LIGATION OF ARTERIOVENOUS  FISTULA Left 09/19/2021   Procedure: LIGATION OF ARTERIOVENOUS  FISTULA;  Surgeon: Larina Earthly, MD;  Location: [facility];  Service: Vascular;  Laterality: Left;   LUMBAR DISC SURGERY  2001   "herniated discs"   MOUTH SURGERY  1991   "drilled into gum and put tooth implants uppers"   NEPHRECTOMY Right 2002   "cancer"   POLYPECTOMY  12/12/2021   Procedure: POLYPECTOMY;  Surgeon: Marguerita Merles, Reuel Boom, MD;  Location: [facility];  Service: Gastroenterology;;   TMJ ARTHROPLASTY  1988   TONSILLECTOMY  1976    UPPER GI ENDOSCOPY      Current Outpatient Medications  Medication Sig Dispense Refill   acetaminophen (TYLENOL) 500 MG tablet Take 1,000 mg by mouth every 6 (six) hours as needed (for pain.).     allopurinol (ZYLOPRIM) 300 MG tablet Take 300 mg by mouth in the morning.  12   ALPRAZolam (XANAX) 0.25 MG tablet Take 0.5 mg by mouth every Monday, Wednesday, and Friday with hemodialysis.     aspirin EC 81 MG tablet Take 81 mg by mouth in the morning. Swallow whole.     atenolol (TENORMIN) 50 MG tablet Take 0.5 tablets (25 mg total) by mouth 2 (two) times daily. 90 tablet 1   calcitRIOL (ROCALTROL) 0.25 MCG capsule Take 0.25 mcg by mouth in the morning.     Cholecalciferol (VITAMIN D3) 250 MCG (10000 UT) TABS Take 10,000 Units by mouth in the morning.     DULoxetine (CYMBALTA) 60 MG capsule Take 60 mg by mouth in the morning.     gabapentin (NEURONTIN) 100 MG capsule Take 100 mg by mouth daily as needed.     hyoscyamine (LEVSIN) 0.125 MG tablet Take 1 tablet (0.125 mg total) by mouth every 8 (eight) hours as needed (abdominal pain). 60 tablet 1   isosorbide dinitrate (ISORDIL) 10 MG tablet Take 1 tablet by mouth twice daily 180 tablet 2   Methoxy PEG-Epoetin Beta (MIRCERA IJ) Inject into the skin. As needed     NITROSTAT 0.4 MG SL tablet Place 1 tablet (0.4 mg total) under the tongue every 5 (five) minutes x 3 doses as needed. 25 tablet 3   rosuvastatin (CRESTOR) 10 MG tablet Take 1 tablet (10 mg total) by mouth daily.     sevelamer carbonate (RENVELA) 800 MG tablet Take 800 mg by mouth 3 (three) times daily.     sodium bicarbonate 650 MG tablet Take 650 mg by mouth 2 (two) times daily.     No current facility-administered medications for this visit.    Allergies as of 12/25/2022 - Review  Complete 12/11/2022  Allergen Reaction Noted   Demerol [meperidine] Other (See Comments) 06/10/2012   Lyrica [pregabalin] Swelling 08/08/2017   Vicodin [hydrocodone-acetaminophen] Other (See Comments)  06/10/2012   Ace inhibitors Cough 07/07/2014   Aleve [naproxen] Other (See Comments)    Codeine Other (See Comments)    Contrast media [iodinated contrast media] Nausea Only and Other (See Comments) 06/01/2016   Fentanyl Itching 05/04/2014   Gluten meal Diarrhea 12/11/2021   Lactose intolerance (gi) Other (See Comments) 12/11/2021   Morphine Nausea And Vomiting    Neurontin [gabapentin] Nausea Only, Swelling, and Other (See Comments) 06/05/2021   Norvasc [amlodipine] Swelling 09/24/2019   Nsaids Other (See Comments) 06/05/2021   Other Other (See Comments) 06/05/2021   Dilaudid [hydromorphone hcl] Itching, Nausea And Vomiting, and Rash 06/10/2012    Family History  Problem Relation Age of Onset   Emphysema Father    Heart disease Father    Clotting disorder Mother    Diabetes Mother    Kidney disease Mother    Allergies Other        whole family per pt   Lung cancer Maternal Uncle    Alcohol abuse Maternal Uncle     Social History   Socioeconomic History   Marital status: Married    Spouse name: Neeve Freiheit   Number of children: 1   Years of education: 12   Highest education level: 12th grade  Occupational History   Occupation: Housewife  Tobacco Use   Smoking status: Never    Passive exposure: Never   Smokeless tobacco: Never  Vaping Use   Vaping status: Never Used  Substance and Sexual Activity   Alcohol use: No    Alcohol/week: 0.0 standard drinks of alcohol   Drug use: No   Sexual activity: Yes    Partners: Male    Birth control/protection: Surgical  Other Topics Concern   Not on file  Social History Narrative   Not on file   Social Determinants of Health   Financial Resource Strain: Low Risk  (11/06/2021)   Received from [facility], [facility]   Overall Financial Resource Strain (CARDIA)    Difficulty of Paying Living Expenses: Not hard at all  Food Insecurity: No Food Insecurity (11/07/2021)   Hunger Vital Sign    Worried About Running Out of  Food in the Last Year: Never true    Ran Out of Food in the Last Year: Never true  Transportation Needs: No Transportation Needs (11/07/2021)   PRAPARE - Administrator, Civil Service (Medical): No    Lack of Transportation (Non-Medical): No  Physical Activity: Inactive (11/06/2021)   Received from [facility], [facility]   Exercise Vital Sign    Days of Exercise per Week: 0 days    Minutes of Exercise per Session: 0 min  Stress: No Stress Concern Present (11/14/2021)   Received from [facility] - Occupational Stress Questionnaire    Feeling of Stress : Only a little  Social Connections: Unknown (11/11/2022)   Received from [facility]   Social Network    Social Network: Not on file    Review of systems General: negative for malaise, night sweats, fever, chills, weight loss Neck: Negative for lumps, goiter, pain and significant neck swelling Resp: Negative for cough, wheezing, dyspnea at rest CV: Negative for chest pain, leg swelling, palpitations, orthopnea GI: denies melena, hematochezia,  constipation, dysphagia, odyonophagia,+ or unintentional weight loss. +nausea +  loose stools +left abdomina/flank pain +bloating +early satiety MSK: Negative for joint pain or swelling, back pain, and muscle pain. Derm: Negative for itching or rash Psych: Denies depression, anxiety, memory loss, confusion. No homicidal or suicidal ideation.  Heme: Negative for prolonged bleeding, bruising easily, and swollen nodes. Endocrine: Negative for cold or heat intolerance, polyuria, polydipsia and goiter. Neuro: negative for tremor, gait imbalance, syncope and seizures. The remainder of the review of systems is noncontributory.  Physical Exam: BP 106/70 (BP Location: Left Arm, Patient Position: Sitting, Cuff Size: Large)   Pulse (!) 59   Temp 97.9 F (36.6 C) (Oral)   Ht 5\' 2"  (1.575 m)   Wt 163 lb 9.6 oz (74.2 kg)   BMI  29.92 kg/m  General:   Alert and oriented. No distress noted. Pleasant and cooperative.  Head:  Normocephalic and atraumatic. Eyes:  Conjuctiva clear without scleral icterus. Mouth:  Oral mucosa pink and moist. Good dentition. No lesions. Heart: Normal rate and rhythm, s1 and s2 heart sounds present.  Lungs: Clear lung sounds in all lobes. Respirations equal and unlabored. Abdomen:  +BS, soft, non-tender and non-distended. No rebound or guarding. No HSM or masses noted. Derm: No palmar erythema or jaundice Msk:  Symmetrical without gross deformities. Normal posture. Extremities:  Without edema. Neurologic:  Alert and  oriented x4 Psych:  Alert and cooperative. Normal mood and affect.  Invalid input(s): "6 MONTHS"   ASSESSMENT: Margaret Huerta is a 77 y.o. female presenting today for follow up of elevated LFTs and abdominal pain and nausea  Epigastric pain evaluated with EGD as above without findings to explain etiology, taking levsin for this which helps. She continues to have nausea, bloating and early satiety,  I do consider there could be some delayed digestion causing her symptoms and recommended gastric emptying study for further evaluation however patient is not sure she wants to undergo this at this time.  For now we will continue with Levsin.  She should let me know if she wishes to proceed with GES.b  Notably she reports some left upper abdominal pain that radiates into her left back/flank, she has a lot of CVA tenderness on exam today.  She has history of right nephrectomy.  She denies any urinary symptoms.  Recent EGD nor CT abdomen pelvis done earlier this year showed any cause for patient's left abdominal pain.  Colonoscopy 1 year ago with a few colon polyps.  In this time it is unclear the source of her left upper abdominal/flank pain as she does not feel this is improved with Levsin, given her CVA tenderness I did encourage her to discuss her pain with her nephrologist as I am  concerned that pain may be more renal etiology versus GI at this time.  Extensive workup for elevated LFTs with liver biopsy showing congestive hepatopathy likely secondary to history of CHF and ESRD which was discussed with the patient.  We will plan to repeat CMP at her next visit  PLAN:  Repeat CMP at next visit  2. Consider GES 3. Continue levsin for epigastric pain 4. Follow up with nephrology regarding left flank pain   All questions were answered, patient verbalized understanding and is in agreement with plan as outlined above.   Follow Up: 4 months   Ericca Labra L. Jeanmarie Hubert, MSN, APRN, AGNP-C Adult-Gerontology Nurse Practitioner [facility]  I have reviewed the note and agree with the APP's assessment as described in this progress note  Katrinka Blazing, MD  Gastroenterology and Hepatology [facility] Gastroenterology

## 2022-12-27 DIAGNOSIS — N186 End stage renal disease: Secondary | ICD-10-CM | POA: Diagnosis not present

## 2022-12-27 DIAGNOSIS — E8770 Fluid overload, unspecified: Secondary | ICD-10-CM | POA: Diagnosis not present

## 2022-12-27 DIAGNOSIS — N2581 Secondary hyperparathyroidism of renal origin: Secondary | ICD-10-CM | POA: Diagnosis not present

## 2022-12-27 DIAGNOSIS — R140 Abdominal distension (gaseous): Secondary | ICD-10-CM | POA: Insufficient documentation

## 2022-12-27 DIAGNOSIS — E119 Type 2 diabetes mellitus without complications: Secondary | ICD-10-CM | POA: Diagnosis not present

## 2022-12-27 DIAGNOSIS — Z23 Encounter for immunization: Secondary | ICD-10-CM | POA: Diagnosis not present

## 2022-12-27 DIAGNOSIS — R109 Unspecified abdominal pain: Secondary | ICD-10-CM | POA: Insufficient documentation

## 2022-12-27 DIAGNOSIS — D689 Coagulation defect, unspecified: Secondary | ICD-10-CM | POA: Diagnosis not present

## 2022-12-27 DIAGNOSIS — E1140 Type 2 diabetes mellitus with diabetic neuropathy, unspecified: Secondary | ICD-10-CM | POA: Diagnosis not present

## 2022-12-27 DIAGNOSIS — D649 Anemia, unspecified: Secondary | ICD-10-CM | POA: Diagnosis not present

## 2022-12-27 DIAGNOSIS — Z992 Dependence on renal dialysis: Secondary | ICD-10-CM | POA: Diagnosis not present

## 2022-12-27 DIAGNOSIS — D631 Anemia in chronic kidney disease: Secondary | ICD-10-CM | POA: Diagnosis not present

## 2022-12-28 ENCOUNTER — Other Ambulatory Visit (HOSPITAL_COMMUNITY): Payer: Self-pay | Admitting: Nephrology

## 2022-12-28 DIAGNOSIS — N2581 Secondary hyperparathyroidism of renal origin: Secondary | ICD-10-CM | POA: Diagnosis not present

## 2022-12-28 DIAGNOSIS — N23 Unspecified renal colic: Secondary | ICD-10-CM

## 2022-12-28 DIAGNOSIS — E119 Type 2 diabetes mellitus without complications: Secondary | ICD-10-CM | POA: Diagnosis not present

## 2022-12-28 DIAGNOSIS — D689 Coagulation defect, unspecified: Secondary | ICD-10-CM | POA: Diagnosis not present

## 2022-12-28 DIAGNOSIS — E8770 Fluid overload, unspecified: Secondary | ICD-10-CM | POA: Diagnosis not present

## 2022-12-28 DIAGNOSIS — E1140 Type 2 diabetes mellitus with diabetic neuropathy, unspecified: Secondary | ICD-10-CM | POA: Diagnosis not present

## 2022-12-28 DIAGNOSIS — D631 Anemia in chronic kidney disease: Secondary | ICD-10-CM | POA: Diagnosis not present

## 2022-12-28 DIAGNOSIS — Z23 Encounter for immunization: Secondary | ICD-10-CM | POA: Diagnosis not present

## 2022-12-28 DIAGNOSIS — D649 Anemia, unspecified: Secondary | ICD-10-CM | POA: Diagnosis not present

## 2022-12-28 DIAGNOSIS — N186 End stage renal disease: Secondary | ICD-10-CM | POA: Diagnosis not present

## 2022-12-28 DIAGNOSIS — Z992 Dependence on renal dialysis: Secondary | ICD-10-CM | POA: Diagnosis not present

## 2023-01-01 DIAGNOSIS — D689 Coagulation defect, unspecified: Secondary | ICD-10-CM | POA: Diagnosis not present

## 2023-01-01 DIAGNOSIS — E1140 Type 2 diabetes mellitus with diabetic neuropathy, unspecified: Secondary | ICD-10-CM | POA: Diagnosis not present

## 2023-01-01 DIAGNOSIS — N2581 Secondary hyperparathyroidism of renal origin: Secondary | ICD-10-CM | POA: Diagnosis not present

## 2023-01-01 DIAGNOSIS — E119 Type 2 diabetes mellitus without complications: Secondary | ICD-10-CM | POA: Diagnosis not present

## 2023-01-01 DIAGNOSIS — N186 End stage renal disease: Secondary | ICD-10-CM | POA: Diagnosis not present

## 2023-01-01 DIAGNOSIS — E8770 Fluid overload, unspecified: Secondary | ICD-10-CM | POA: Diagnosis not present

## 2023-01-01 DIAGNOSIS — Z23 Encounter for immunization: Secondary | ICD-10-CM | POA: Diagnosis not present

## 2023-01-01 DIAGNOSIS — Z992 Dependence on renal dialysis: Secondary | ICD-10-CM | POA: Diagnosis not present

## 2023-01-01 DIAGNOSIS — D649 Anemia, unspecified: Secondary | ICD-10-CM | POA: Diagnosis not present

## 2023-01-01 DIAGNOSIS — D631 Anemia in chronic kidney disease: Secondary | ICD-10-CM | POA: Diagnosis not present

## 2023-01-02 DIAGNOSIS — D631 Anemia in chronic kidney disease: Secondary | ICD-10-CM | POA: Diagnosis not present

## 2023-01-02 DIAGNOSIS — E119 Type 2 diabetes mellitus without complications: Secondary | ICD-10-CM | POA: Diagnosis not present

## 2023-01-02 DIAGNOSIS — N2581 Secondary hyperparathyroidism of renal origin: Secondary | ICD-10-CM | POA: Diagnosis not present

## 2023-01-02 DIAGNOSIS — Z992 Dependence on renal dialysis: Secondary | ICD-10-CM | POA: Diagnosis not present

## 2023-01-02 DIAGNOSIS — E1140 Type 2 diabetes mellitus with diabetic neuropathy, unspecified: Secondary | ICD-10-CM | POA: Diagnosis not present

## 2023-01-02 DIAGNOSIS — Z23 Encounter for immunization: Secondary | ICD-10-CM | POA: Diagnosis not present

## 2023-01-02 DIAGNOSIS — D689 Coagulation defect, unspecified: Secondary | ICD-10-CM | POA: Diagnosis not present

## 2023-01-02 DIAGNOSIS — E8770 Fluid overload, unspecified: Secondary | ICD-10-CM | POA: Diagnosis not present

## 2023-01-02 DIAGNOSIS — D649 Anemia, unspecified: Secondary | ICD-10-CM | POA: Diagnosis not present

## 2023-01-02 DIAGNOSIS — N186 End stage renal disease: Secondary | ICD-10-CM | POA: Diagnosis not present

## 2023-01-03 DIAGNOSIS — E119 Type 2 diabetes mellitus without complications: Secondary | ICD-10-CM | POA: Diagnosis not present

## 2023-01-03 DIAGNOSIS — E1140 Type 2 diabetes mellitus with diabetic neuropathy, unspecified: Secondary | ICD-10-CM | POA: Diagnosis not present

## 2023-01-03 DIAGNOSIS — E8770 Fluid overload, unspecified: Secondary | ICD-10-CM | POA: Diagnosis not present

## 2023-01-03 DIAGNOSIS — D689 Coagulation defect, unspecified: Secondary | ICD-10-CM | POA: Diagnosis not present

## 2023-01-03 DIAGNOSIS — N186 End stage renal disease: Secondary | ICD-10-CM | POA: Diagnosis not present

## 2023-01-03 DIAGNOSIS — Z992 Dependence on renal dialysis: Secondary | ICD-10-CM | POA: Diagnosis not present

## 2023-01-03 DIAGNOSIS — N2581 Secondary hyperparathyroidism of renal origin: Secondary | ICD-10-CM | POA: Diagnosis not present

## 2023-01-03 DIAGNOSIS — Z23 Encounter for immunization: Secondary | ICD-10-CM | POA: Diagnosis not present

## 2023-01-03 DIAGNOSIS — D649 Anemia, unspecified: Secondary | ICD-10-CM | POA: Diagnosis not present

## 2023-01-03 DIAGNOSIS — D631 Anemia in chronic kidney disease: Secondary | ICD-10-CM | POA: Diagnosis not present

## 2023-01-04 DIAGNOSIS — D689 Coagulation defect, unspecified: Secondary | ICD-10-CM | POA: Diagnosis not present

## 2023-01-04 DIAGNOSIS — Z23 Encounter for immunization: Secondary | ICD-10-CM | POA: Diagnosis not present

## 2023-01-04 DIAGNOSIS — N186 End stage renal disease: Secondary | ICD-10-CM | POA: Diagnosis not present

## 2023-01-04 DIAGNOSIS — N2581 Secondary hyperparathyroidism of renal origin: Secondary | ICD-10-CM | POA: Diagnosis not present

## 2023-01-04 DIAGNOSIS — D649 Anemia, unspecified: Secondary | ICD-10-CM | POA: Diagnosis not present

## 2023-01-04 DIAGNOSIS — E119 Type 2 diabetes mellitus without complications: Secondary | ICD-10-CM | POA: Diagnosis not present

## 2023-01-04 DIAGNOSIS — E1140 Type 2 diabetes mellitus with diabetic neuropathy, unspecified: Secondary | ICD-10-CM | POA: Diagnosis not present

## 2023-01-04 DIAGNOSIS — D631 Anemia in chronic kidney disease: Secondary | ICD-10-CM | POA: Diagnosis not present

## 2023-01-04 DIAGNOSIS — Z992 Dependence on renal dialysis: Secondary | ICD-10-CM | POA: Diagnosis not present

## 2023-01-04 DIAGNOSIS — E8770 Fluid overload, unspecified: Secondary | ICD-10-CM | POA: Diagnosis not present

## 2023-01-07 DIAGNOSIS — E8770 Fluid overload, unspecified: Secondary | ICD-10-CM | POA: Diagnosis not present

## 2023-01-07 DIAGNOSIS — Z992 Dependence on renal dialysis: Secondary | ICD-10-CM | POA: Diagnosis not present

## 2023-01-07 DIAGNOSIS — N186 End stage renal disease: Secondary | ICD-10-CM | POA: Diagnosis not present

## 2023-01-07 DIAGNOSIS — Z23 Encounter for immunization: Secondary | ICD-10-CM | POA: Diagnosis not present

## 2023-01-07 DIAGNOSIS — E1140 Type 2 diabetes mellitus with diabetic neuropathy, unspecified: Secondary | ICD-10-CM | POA: Diagnosis not present

## 2023-01-07 DIAGNOSIS — E119 Type 2 diabetes mellitus without complications: Secondary | ICD-10-CM | POA: Diagnosis not present

## 2023-01-07 DIAGNOSIS — D631 Anemia in chronic kidney disease: Secondary | ICD-10-CM | POA: Diagnosis not present

## 2023-01-07 DIAGNOSIS — N2581 Secondary hyperparathyroidism of renal origin: Secondary | ICD-10-CM | POA: Diagnosis not present

## 2023-01-07 DIAGNOSIS — D689 Coagulation defect, unspecified: Secondary | ICD-10-CM | POA: Diagnosis not present

## 2023-01-07 DIAGNOSIS — D649 Anemia, unspecified: Secondary | ICD-10-CM | POA: Diagnosis not present

## 2023-01-08 ENCOUNTER — Ambulatory Visit (HOSPITAL_COMMUNITY)
Admission: RE | Admit: 2023-01-08 | Discharge: 2023-01-08 | Disposition: A | Discharge status: Home / Self Care | Payer: Medicare Other | Source: Ambulatory Visit | Attending: Nephrology | Admitting: Nephrology

## 2023-01-08 DIAGNOSIS — N2581 Secondary hyperparathyroidism of renal origin: Secondary | ICD-10-CM | POA: Diagnosis not present

## 2023-01-08 DIAGNOSIS — D631 Anemia in chronic kidney disease: Secondary | ICD-10-CM | POA: Diagnosis not present

## 2023-01-08 DIAGNOSIS — N23 Unspecified renal colic: Secondary | ICD-10-CM | POA: Insufficient documentation

## 2023-01-08 DIAGNOSIS — Z992 Dependence on renal dialysis: Secondary | ICD-10-CM | POA: Diagnosis not present

## 2023-01-08 DIAGNOSIS — D689 Coagulation defect, unspecified: Secondary | ICD-10-CM | POA: Diagnosis not present

## 2023-01-08 DIAGNOSIS — N186 End stage renal disease: Secondary | ICD-10-CM | POA: Diagnosis not present

## 2023-01-08 DIAGNOSIS — E8770 Fluid overload, unspecified: Secondary | ICD-10-CM | POA: Diagnosis not present

## 2023-01-08 DIAGNOSIS — D649 Anemia, unspecified: Secondary | ICD-10-CM | POA: Diagnosis not present

## 2023-01-08 DIAGNOSIS — Z23 Encounter for immunization: Secondary | ICD-10-CM | POA: Diagnosis not present

## 2023-01-08 DIAGNOSIS — E119 Type 2 diabetes mellitus without complications: Secondary | ICD-10-CM | POA: Diagnosis not present

## 2023-01-08 DIAGNOSIS — E1140 Type 2 diabetes mellitus with diabetic neuropathy, unspecified: Secondary | ICD-10-CM | POA: Diagnosis not present

## 2023-01-11 DIAGNOSIS — D649 Anemia, unspecified: Secondary | ICD-10-CM | POA: Diagnosis not present

## 2023-01-11 DIAGNOSIS — N2581 Secondary hyperparathyroidism of renal origin: Secondary | ICD-10-CM | POA: Diagnosis not present

## 2023-01-11 DIAGNOSIS — Z23 Encounter for immunization: Secondary | ICD-10-CM | POA: Diagnosis not present

## 2023-01-11 DIAGNOSIS — D689 Coagulation defect, unspecified: Secondary | ICD-10-CM | POA: Diagnosis not present

## 2023-01-11 DIAGNOSIS — Z992 Dependence on renal dialysis: Secondary | ICD-10-CM | POA: Diagnosis not present

## 2023-01-11 DIAGNOSIS — E1140 Type 2 diabetes mellitus with diabetic neuropathy, unspecified: Secondary | ICD-10-CM | POA: Diagnosis not present

## 2023-01-11 DIAGNOSIS — N186 End stage renal disease: Secondary | ICD-10-CM | POA: Diagnosis not present

## 2023-01-11 DIAGNOSIS — E8770 Fluid overload, unspecified: Secondary | ICD-10-CM | POA: Diagnosis not present

## 2023-01-11 DIAGNOSIS — D631 Anemia in chronic kidney disease: Secondary | ICD-10-CM | POA: Diagnosis not present

## 2023-01-11 DIAGNOSIS — E119 Type 2 diabetes mellitus without complications: Secondary | ICD-10-CM | POA: Diagnosis not present

## 2023-01-12 DIAGNOSIS — Z23 Encounter for immunization: Secondary | ICD-10-CM | POA: Diagnosis not present

## 2023-01-12 DIAGNOSIS — Z992 Dependence on renal dialysis: Secondary | ICD-10-CM | POA: Diagnosis not present

## 2023-01-12 DIAGNOSIS — N2581 Secondary hyperparathyroidism of renal origin: Secondary | ICD-10-CM | POA: Diagnosis not present

## 2023-01-12 DIAGNOSIS — E119 Type 2 diabetes mellitus without complications: Secondary | ICD-10-CM | POA: Diagnosis not present

## 2023-01-12 DIAGNOSIS — N186 End stage renal disease: Secondary | ICD-10-CM | POA: Diagnosis not present

## 2023-01-12 DIAGNOSIS — D631 Anemia in chronic kidney disease: Secondary | ICD-10-CM | POA: Diagnosis not present

## 2023-01-12 DIAGNOSIS — D689 Coagulation defect, unspecified: Secondary | ICD-10-CM | POA: Diagnosis not present

## 2023-01-12 DIAGNOSIS — E1140 Type 2 diabetes mellitus with diabetic neuropathy, unspecified: Secondary | ICD-10-CM | POA: Diagnosis not present

## 2023-01-12 DIAGNOSIS — E8770 Fluid overload, unspecified: Secondary | ICD-10-CM | POA: Diagnosis not present

## 2023-01-12 DIAGNOSIS — D649 Anemia, unspecified: Secondary | ICD-10-CM | POA: Diagnosis not present

## 2023-01-13 DIAGNOSIS — N186 End stage renal disease: Secondary | ICD-10-CM | POA: Diagnosis not present

## 2023-01-13 DIAGNOSIS — N281 Cyst of kidney, acquired: Secondary | ICD-10-CM | POA: Diagnosis not present

## 2023-01-13 DIAGNOSIS — Z992 Dependence on renal dialysis: Secondary | ICD-10-CM | POA: Diagnosis not present

## 2023-01-14 DIAGNOSIS — Z4931 Encounter for adequacy testing for hemodialysis: Secondary | ICD-10-CM | POA: Diagnosis not present

## 2023-01-14 DIAGNOSIS — E1140 Type 2 diabetes mellitus with diabetic neuropathy, unspecified: Secondary | ICD-10-CM | POA: Diagnosis not present

## 2023-01-14 DIAGNOSIS — D649 Anemia, unspecified: Secondary | ICD-10-CM | POA: Diagnosis not present

## 2023-01-14 DIAGNOSIS — E119 Type 2 diabetes mellitus without complications: Secondary | ICD-10-CM | POA: Diagnosis not present

## 2023-01-14 DIAGNOSIS — N186 End stage renal disease: Secondary | ICD-10-CM | POA: Diagnosis not present

## 2023-01-14 DIAGNOSIS — D689 Coagulation defect, unspecified: Secondary | ICD-10-CM | POA: Diagnosis not present

## 2023-01-14 DIAGNOSIS — D631 Anemia in chronic kidney disease: Secondary | ICD-10-CM | POA: Diagnosis not present

## 2023-01-14 DIAGNOSIS — E8770 Fluid overload, unspecified: Secondary | ICD-10-CM | POA: Diagnosis not present

## 2023-01-14 DIAGNOSIS — N2581 Secondary hyperparathyroidism of renal origin: Secondary | ICD-10-CM | POA: Diagnosis not present

## 2023-01-14 DIAGNOSIS — Z992 Dependence on renal dialysis: Secondary | ICD-10-CM | POA: Diagnosis not present

## 2023-01-15 DIAGNOSIS — E1140 Type 2 diabetes mellitus with diabetic neuropathy, unspecified: Secondary | ICD-10-CM | POA: Diagnosis not present

## 2023-01-15 DIAGNOSIS — E119 Type 2 diabetes mellitus without complications: Secondary | ICD-10-CM | POA: Diagnosis not present

## 2023-01-15 DIAGNOSIS — N2581 Secondary hyperparathyroidism of renal origin: Secondary | ICD-10-CM | POA: Diagnosis not present

## 2023-01-15 DIAGNOSIS — D649 Anemia, unspecified: Secondary | ICD-10-CM | POA: Diagnosis not present

## 2023-01-15 DIAGNOSIS — N186 End stage renal disease: Secondary | ICD-10-CM | POA: Diagnosis not present

## 2023-01-15 DIAGNOSIS — Z992 Dependence on renal dialysis: Secondary | ICD-10-CM | POA: Diagnosis not present

## 2023-01-15 DIAGNOSIS — Z4931 Encounter for adequacy testing for hemodialysis: Secondary | ICD-10-CM | POA: Diagnosis not present

## 2023-01-15 DIAGNOSIS — E8770 Fluid overload, unspecified: Secondary | ICD-10-CM | POA: Diagnosis not present

## 2023-01-15 DIAGNOSIS — D631 Anemia in chronic kidney disease: Secondary | ICD-10-CM | POA: Diagnosis not present

## 2023-01-15 DIAGNOSIS — D689 Coagulation defect, unspecified: Secondary | ICD-10-CM | POA: Diagnosis not present

## 2023-01-17 DIAGNOSIS — Z992 Dependence on renal dialysis: Secondary | ICD-10-CM | POA: Diagnosis not present

## 2023-01-17 DIAGNOSIS — E1140 Type 2 diabetes mellitus with diabetic neuropathy, unspecified: Secondary | ICD-10-CM | POA: Diagnosis not present

## 2023-01-17 DIAGNOSIS — N2581 Secondary hyperparathyroidism of renal origin: Secondary | ICD-10-CM | POA: Diagnosis not present

## 2023-01-17 DIAGNOSIS — N186 End stage renal disease: Secondary | ICD-10-CM | POA: Diagnosis not present

## 2023-01-17 DIAGNOSIS — D689 Coagulation defect, unspecified: Secondary | ICD-10-CM | POA: Diagnosis not present

## 2023-01-17 DIAGNOSIS — D631 Anemia in chronic kidney disease: Secondary | ICD-10-CM | POA: Diagnosis not present

## 2023-01-17 DIAGNOSIS — Z4931 Encounter for adequacy testing for hemodialysis: Secondary | ICD-10-CM | POA: Diagnosis not present

## 2023-01-17 DIAGNOSIS — D649 Anemia, unspecified: Secondary | ICD-10-CM | POA: Diagnosis not present

## 2023-01-17 DIAGNOSIS — E8770 Fluid overload, unspecified: Secondary | ICD-10-CM | POA: Diagnosis not present

## 2023-01-17 DIAGNOSIS — E119 Type 2 diabetes mellitus without complications: Secondary | ICD-10-CM | POA: Diagnosis not present

## 2023-01-18 DIAGNOSIS — D649 Anemia, unspecified: Secondary | ICD-10-CM | POA: Diagnosis not present

## 2023-01-18 DIAGNOSIS — N186 End stage renal disease: Secondary | ICD-10-CM | POA: Diagnosis not present

## 2023-01-18 DIAGNOSIS — N2581 Secondary hyperparathyroidism of renal origin: Secondary | ICD-10-CM | POA: Diagnosis not present

## 2023-01-18 DIAGNOSIS — Z992 Dependence on renal dialysis: Secondary | ICD-10-CM | POA: Diagnosis not present

## 2023-01-18 DIAGNOSIS — E119 Type 2 diabetes mellitus without complications: Secondary | ICD-10-CM | POA: Diagnosis not present

## 2023-01-18 DIAGNOSIS — D631 Anemia in chronic kidney disease: Secondary | ICD-10-CM | POA: Diagnosis not present

## 2023-01-18 DIAGNOSIS — E8770 Fluid overload, unspecified: Secondary | ICD-10-CM | POA: Diagnosis not present

## 2023-01-18 DIAGNOSIS — D689 Coagulation defect, unspecified: Secondary | ICD-10-CM | POA: Diagnosis not present

## 2023-01-18 DIAGNOSIS — E1140 Type 2 diabetes mellitus with diabetic neuropathy, unspecified: Secondary | ICD-10-CM | POA: Diagnosis not present

## 2023-01-18 DIAGNOSIS — Z4931 Encounter for adequacy testing for hemodialysis: Secondary | ICD-10-CM | POA: Diagnosis not present

## 2023-01-21 ENCOUNTER — Ambulatory Visit (HOSPITAL_COMMUNITY)
Admission: RE | Admit: 2023-01-21 | Discharge: 2023-01-21 | Disposition: A | Discharge status: Home / Self Care | Payer: Medicare Other | Source: Ambulatory Visit | Attending: Internal Medicine | Admitting: Internal Medicine

## 2023-01-21 DIAGNOSIS — E1140 Type 2 diabetes mellitus with diabetic neuropathy, unspecified: Secondary | ICD-10-CM | POA: Diagnosis not present

## 2023-01-21 DIAGNOSIS — E119 Type 2 diabetes mellitus without complications: Secondary | ICD-10-CM | POA: Diagnosis not present

## 2023-01-21 DIAGNOSIS — E8770 Fluid overload, unspecified: Secondary | ICD-10-CM | POA: Diagnosis not present

## 2023-01-21 DIAGNOSIS — Z4931 Encounter for adequacy testing for hemodialysis: Secondary | ICD-10-CM | POA: Diagnosis not present

## 2023-01-21 DIAGNOSIS — D649 Anemia, unspecified: Secondary | ICD-10-CM | POA: Diagnosis not present

## 2023-01-21 DIAGNOSIS — Z1231 Encounter for screening mammogram for malignant neoplasm of breast: Secondary | ICD-10-CM | POA: Diagnosis not present

## 2023-01-21 DIAGNOSIS — N186 End stage renal disease: Secondary | ICD-10-CM | POA: Diagnosis not present

## 2023-01-21 DIAGNOSIS — D631 Anemia in chronic kidney disease: Secondary | ICD-10-CM | POA: Diagnosis not present

## 2023-01-21 DIAGNOSIS — Z992 Dependence on renal dialysis: Secondary | ICD-10-CM | POA: Diagnosis not present

## 2023-01-21 DIAGNOSIS — N2581 Secondary hyperparathyroidism of renal origin: Secondary | ICD-10-CM | POA: Diagnosis not present

## 2023-01-21 DIAGNOSIS — D689 Coagulation defect, unspecified: Secondary | ICD-10-CM | POA: Diagnosis not present

## 2023-01-22 DIAGNOSIS — Z4931 Encounter for adequacy testing for hemodialysis: Secondary | ICD-10-CM | POA: Diagnosis not present

## 2023-01-22 DIAGNOSIS — D689 Coagulation defect, unspecified: Secondary | ICD-10-CM | POA: Diagnosis not present

## 2023-01-22 DIAGNOSIS — N2581 Secondary hyperparathyroidism of renal origin: Secondary | ICD-10-CM | POA: Diagnosis not present

## 2023-01-22 DIAGNOSIS — D631 Anemia in chronic kidney disease: Secondary | ICD-10-CM | POA: Diagnosis not present

## 2023-01-22 DIAGNOSIS — Z992 Dependence on renal dialysis: Secondary | ICD-10-CM | POA: Diagnosis not present

## 2023-01-22 DIAGNOSIS — N186 End stage renal disease: Secondary | ICD-10-CM | POA: Diagnosis not present

## 2023-01-22 DIAGNOSIS — E8770 Fluid overload, unspecified: Secondary | ICD-10-CM | POA: Diagnosis not present

## 2023-01-22 DIAGNOSIS — E1140 Type 2 diabetes mellitus with diabetic neuropathy, unspecified: Secondary | ICD-10-CM | POA: Diagnosis not present

## 2023-01-22 DIAGNOSIS — D649 Anemia, unspecified: Secondary | ICD-10-CM | POA: Diagnosis not present

## 2023-01-22 DIAGNOSIS — E119 Type 2 diabetes mellitus without complications: Secondary | ICD-10-CM | POA: Diagnosis not present

## 2023-01-24 DIAGNOSIS — Z992 Dependence on renal dialysis: Secondary | ICD-10-CM | POA: Diagnosis not present

## 2023-01-24 DIAGNOSIS — E8770 Fluid overload, unspecified: Secondary | ICD-10-CM | POA: Diagnosis not present

## 2023-01-24 DIAGNOSIS — D689 Coagulation defect, unspecified: Secondary | ICD-10-CM | POA: Diagnosis not present

## 2023-01-24 DIAGNOSIS — D631 Anemia in chronic kidney disease: Secondary | ICD-10-CM | POA: Diagnosis not present

## 2023-01-24 DIAGNOSIS — E119 Type 2 diabetes mellitus without complications: Secondary | ICD-10-CM | POA: Diagnosis not present

## 2023-01-24 DIAGNOSIS — D649 Anemia, unspecified: Secondary | ICD-10-CM | POA: Diagnosis not present

## 2023-01-24 DIAGNOSIS — E1140 Type 2 diabetes mellitus with diabetic neuropathy, unspecified: Secondary | ICD-10-CM | POA: Diagnosis not present

## 2023-01-24 DIAGNOSIS — N2581 Secondary hyperparathyroidism of renal origin: Secondary | ICD-10-CM | POA: Diagnosis not present

## 2023-01-24 DIAGNOSIS — N186 End stage renal disease: Secondary | ICD-10-CM | POA: Diagnosis not present

## 2023-01-24 DIAGNOSIS — Z4931 Encounter for adequacy testing for hemodialysis: Secondary | ICD-10-CM | POA: Diagnosis not present

## 2023-01-25 DIAGNOSIS — N2581 Secondary hyperparathyroidism of renal origin: Secondary | ICD-10-CM | POA: Diagnosis not present

## 2023-01-25 DIAGNOSIS — E119 Type 2 diabetes mellitus without complications: Secondary | ICD-10-CM | POA: Diagnosis not present

## 2023-01-25 DIAGNOSIS — D631 Anemia in chronic kidney disease: Secondary | ICD-10-CM | POA: Diagnosis not present

## 2023-01-25 DIAGNOSIS — Z992 Dependence on renal dialysis: Secondary | ICD-10-CM | POA: Diagnosis not present

## 2023-01-25 DIAGNOSIS — D689 Coagulation defect, unspecified: Secondary | ICD-10-CM | POA: Diagnosis not present

## 2023-01-25 DIAGNOSIS — Z4931 Encounter for adequacy testing for hemodialysis: Secondary | ICD-10-CM | POA: Diagnosis not present

## 2023-01-25 DIAGNOSIS — E1140 Type 2 diabetes mellitus with diabetic neuropathy, unspecified: Secondary | ICD-10-CM | POA: Diagnosis not present

## 2023-01-25 DIAGNOSIS — N186 End stage renal disease: Secondary | ICD-10-CM | POA: Diagnosis not present

## 2023-01-25 DIAGNOSIS — D649 Anemia, unspecified: Secondary | ICD-10-CM | POA: Diagnosis not present

## 2023-01-25 DIAGNOSIS — E8770 Fluid overload, unspecified: Secondary | ICD-10-CM | POA: Diagnosis not present

## 2023-01-28 DIAGNOSIS — N2581 Secondary hyperparathyroidism of renal origin: Secondary | ICD-10-CM | POA: Diagnosis not present

## 2023-01-28 DIAGNOSIS — Z992 Dependence on renal dialysis: Secondary | ICD-10-CM | POA: Diagnosis not present

## 2023-01-28 DIAGNOSIS — D649 Anemia, unspecified: Secondary | ICD-10-CM | POA: Diagnosis not present

## 2023-01-28 DIAGNOSIS — E119 Type 2 diabetes mellitus without complications: Secondary | ICD-10-CM | POA: Diagnosis not present

## 2023-01-28 DIAGNOSIS — E1140 Type 2 diabetes mellitus with diabetic neuropathy, unspecified: Secondary | ICD-10-CM | POA: Diagnosis not present

## 2023-01-28 DIAGNOSIS — D631 Anemia in chronic kidney disease: Secondary | ICD-10-CM | POA: Diagnosis not present

## 2023-01-28 DIAGNOSIS — E8770 Fluid overload, unspecified: Secondary | ICD-10-CM | POA: Diagnosis not present

## 2023-01-28 DIAGNOSIS — N186 End stage renal disease: Secondary | ICD-10-CM | POA: Diagnosis not present

## 2023-01-28 DIAGNOSIS — Z4931 Encounter for adequacy testing for hemodialysis: Secondary | ICD-10-CM | POA: Diagnosis not present

## 2023-01-28 DIAGNOSIS — D689 Coagulation defect, unspecified: Secondary | ICD-10-CM | POA: Diagnosis not present

## 2023-01-29 ENCOUNTER — Encounter: Payer: Self-pay | Admitting: *Deleted

## 2023-01-29 DIAGNOSIS — E119 Type 2 diabetes mellitus without complications: Secondary | ICD-10-CM | POA: Diagnosis not present

## 2023-01-29 DIAGNOSIS — E8770 Fluid overload, unspecified: Secondary | ICD-10-CM | POA: Diagnosis not present

## 2023-01-29 DIAGNOSIS — Z992 Dependence on renal dialysis: Secondary | ICD-10-CM | POA: Diagnosis not present

## 2023-01-29 DIAGNOSIS — E1140 Type 2 diabetes mellitus with diabetic neuropathy, unspecified: Secondary | ICD-10-CM | POA: Diagnosis not present

## 2023-01-29 DIAGNOSIS — N2581 Secondary hyperparathyroidism of renal origin: Secondary | ICD-10-CM | POA: Diagnosis not present

## 2023-01-29 DIAGNOSIS — Z4931 Encounter for adequacy testing for hemodialysis: Secondary | ICD-10-CM | POA: Diagnosis not present

## 2023-01-29 DIAGNOSIS — D649 Anemia, unspecified: Secondary | ICD-10-CM | POA: Diagnosis not present

## 2023-01-29 DIAGNOSIS — N186 End stage renal disease: Secondary | ICD-10-CM | POA: Diagnosis not present

## 2023-01-29 DIAGNOSIS — D689 Coagulation defect, unspecified: Secondary | ICD-10-CM | POA: Diagnosis not present

## 2023-01-29 DIAGNOSIS — D631 Anemia in chronic kidney disease: Secondary | ICD-10-CM | POA: Diagnosis not present

## 2023-01-31 ENCOUNTER — Other Ambulatory Visit: Payer: Self-pay | Admitting: Cardiology

## 2023-02-01 DIAGNOSIS — Z992 Dependence on renal dialysis: Secondary | ICD-10-CM | POA: Diagnosis not present

## 2023-02-01 DIAGNOSIS — N186 End stage renal disease: Secondary | ICD-10-CM | POA: Diagnosis not present

## 2023-02-01 DIAGNOSIS — E119 Type 2 diabetes mellitus without complications: Secondary | ICD-10-CM | POA: Diagnosis not present

## 2023-02-01 DIAGNOSIS — D631 Anemia in chronic kidney disease: Secondary | ICD-10-CM | POA: Diagnosis not present

## 2023-02-01 DIAGNOSIS — E1140 Type 2 diabetes mellitus with diabetic neuropathy, unspecified: Secondary | ICD-10-CM | POA: Diagnosis not present

## 2023-02-01 DIAGNOSIS — N2581 Secondary hyperparathyroidism of renal origin: Secondary | ICD-10-CM | POA: Diagnosis not present

## 2023-02-01 DIAGNOSIS — Z4931 Encounter for adequacy testing for hemodialysis: Secondary | ICD-10-CM | POA: Diagnosis not present

## 2023-02-01 DIAGNOSIS — E8770 Fluid overload, unspecified: Secondary | ICD-10-CM | POA: Diagnosis not present

## 2023-02-01 DIAGNOSIS — D689 Coagulation defect, unspecified: Secondary | ICD-10-CM | POA: Diagnosis not present

## 2023-02-01 DIAGNOSIS — D649 Anemia, unspecified: Secondary | ICD-10-CM | POA: Diagnosis not present

## 2023-02-02 DIAGNOSIS — D689 Coagulation defect, unspecified: Secondary | ICD-10-CM | POA: Diagnosis not present

## 2023-02-02 DIAGNOSIS — E119 Type 2 diabetes mellitus without complications: Secondary | ICD-10-CM | POA: Diagnosis not present

## 2023-02-02 DIAGNOSIS — D649 Anemia, unspecified: Secondary | ICD-10-CM | POA: Diagnosis not present

## 2023-02-02 DIAGNOSIS — E8770 Fluid overload, unspecified: Secondary | ICD-10-CM | POA: Diagnosis not present

## 2023-02-02 DIAGNOSIS — Z4931 Encounter for adequacy testing for hemodialysis: Secondary | ICD-10-CM | POA: Diagnosis not present

## 2023-02-02 DIAGNOSIS — N2581 Secondary hyperparathyroidism of renal origin: Secondary | ICD-10-CM | POA: Diagnosis not present

## 2023-02-02 DIAGNOSIS — E1140 Type 2 diabetes mellitus with diabetic neuropathy, unspecified: Secondary | ICD-10-CM | POA: Diagnosis not present

## 2023-02-02 DIAGNOSIS — D631 Anemia in chronic kidney disease: Secondary | ICD-10-CM | POA: Diagnosis not present

## 2023-02-02 DIAGNOSIS — Z992 Dependence on renal dialysis: Secondary | ICD-10-CM | POA: Diagnosis not present

## 2023-02-02 DIAGNOSIS — N186 End stage renal disease: Secondary | ICD-10-CM | POA: Diagnosis not present

## 2023-02-03 DIAGNOSIS — E1140 Type 2 diabetes mellitus with diabetic neuropathy, unspecified: Secondary | ICD-10-CM | POA: Diagnosis not present

## 2023-02-03 DIAGNOSIS — E119 Type 2 diabetes mellitus without complications: Secondary | ICD-10-CM | POA: Diagnosis not present

## 2023-02-03 DIAGNOSIS — D689 Coagulation defect, unspecified: Secondary | ICD-10-CM | POA: Diagnosis not present

## 2023-02-03 DIAGNOSIS — N186 End stage renal disease: Secondary | ICD-10-CM | POA: Diagnosis not present

## 2023-02-03 DIAGNOSIS — Z4931 Encounter for adequacy testing for hemodialysis: Secondary | ICD-10-CM | POA: Diagnosis not present

## 2023-02-03 DIAGNOSIS — D631 Anemia in chronic kidney disease: Secondary | ICD-10-CM | POA: Diagnosis not present

## 2023-02-03 DIAGNOSIS — Z992 Dependence on renal dialysis: Secondary | ICD-10-CM | POA: Diagnosis not present

## 2023-02-03 DIAGNOSIS — E8770 Fluid overload, unspecified: Secondary | ICD-10-CM | POA: Diagnosis not present

## 2023-02-03 DIAGNOSIS — D649 Anemia, unspecified: Secondary | ICD-10-CM | POA: Diagnosis not present

## 2023-02-03 DIAGNOSIS — N2581 Secondary hyperparathyroidism of renal origin: Secondary | ICD-10-CM | POA: Diagnosis not present

## 2023-02-04 DIAGNOSIS — Z992 Dependence on renal dialysis: Secondary | ICD-10-CM | POA: Diagnosis not present

## 2023-02-04 DIAGNOSIS — N2581 Secondary hyperparathyroidism of renal origin: Secondary | ICD-10-CM | POA: Diagnosis not present

## 2023-02-04 DIAGNOSIS — E1140 Type 2 diabetes mellitus with diabetic neuropathy, unspecified: Secondary | ICD-10-CM | POA: Diagnosis not present

## 2023-02-04 DIAGNOSIS — D689 Coagulation defect, unspecified: Secondary | ICD-10-CM | POA: Diagnosis not present

## 2023-02-04 DIAGNOSIS — Z4931 Encounter for adequacy testing for hemodialysis: Secondary | ICD-10-CM | POA: Diagnosis not present

## 2023-02-04 DIAGNOSIS — E8770 Fluid overload, unspecified: Secondary | ICD-10-CM | POA: Diagnosis not present

## 2023-02-04 DIAGNOSIS — E119 Type 2 diabetes mellitus without complications: Secondary | ICD-10-CM | POA: Diagnosis not present

## 2023-02-04 DIAGNOSIS — N186 End stage renal disease: Secondary | ICD-10-CM | POA: Diagnosis not present

## 2023-02-04 DIAGNOSIS — D649 Anemia, unspecified: Secondary | ICD-10-CM | POA: Diagnosis not present

## 2023-02-04 DIAGNOSIS — D631 Anemia in chronic kidney disease: Secondary | ICD-10-CM | POA: Diagnosis not present

## 2023-02-05 DIAGNOSIS — D631 Anemia in chronic kidney disease: Secondary | ICD-10-CM | POA: Diagnosis not present

## 2023-02-05 DIAGNOSIS — E8770 Fluid overload, unspecified: Secondary | ICD-10-CM | POA: Diagnosis not present

## 2023-02-05 DIAGNOSIS — E119 Type 2 diabetes mellitus without complications: Secondary | ICD-10-CM | POA: Diagnosis not present

## 2023-02-05 DIAGNOSIS — N186 End stage renal disease: Secondary | ICD-10-CM | POA: Diagnosis not present

## 2023-02-05 DIAGNOSIS — N2581 Secondary hyperparathyroidism of renal origin: Secondary | ICD-10-CM | POA: Diagnosis not present

## 2023-02-05 DIAGNOSIS — Z4931 Encounter for adequacy testing for hemodialysis: Secondary | ICD-10-CM | POA: Diagnosis not present

## 2023-02-05 DIAGNOSIS — Z992 Dependence on renal dialysis: Secondary | ICD-10-CM | POA: Diagnosis not present

## 2023-02-05 DIAGNOSIS — D689 Coagulation defect, unspecified: Secondary | ICD-10-CM | POA: Diagnosis not present

## 2023-02-05 DIAGNOSIS — D649 Anemia, unspecified: Secondary | ICD-10-CM | POA: Diagnosis not present

## 2023-02-05 DIAGNOSIS — E1140 Type 2 diabetes mellitus with diabetic neuropathy, unspecified: Secondary | ICD-10-CM | POA: Diagnosis not present

## 2023-02-07 DIAGNOSIS — D631 Anemia in chronic kidney disease: Secondary | ICD-10-CM | POA: Diagnosis not present

## 2023-02-07 DIAGNOSIS — Z992 Dependence on renal dialysis: Secondary | ICD-10-CM | POA: Diagnosis not present

## 2023-02-07 DIAGNOSIS — Z4931 Encounter for adequacy testing for hemodialysis: Secondary | ICD-10-CM | POA: Diagnosis not present

## 2023-02-07 DIAGNOSIS — D689 Coagulation defect, unspecified: Secondary | ICD-10-CM | POA: Diagnosis not present

## 2023-02-07 DIAGNOSIS — E1140 Type 2 diabetes mellitus with diabetic neuropathy, unspecified: Secondary | ICD-10-CM | POA: Diagnosis not present

## 2023-02-07 DIAGNOSIS — N2581 Secondary hyperparathyroidism of renal origin: Secondary | ICD-10-CM | POA: Diagnosis not present

## 2023-02-07 DIAGNOSIS — N186 End stage renal disease: Secondary | ICD-10-CM | POA: Diagnosis not present

## 2023-02-07 DIAGNOSIS — E8770 Fluid overload, unspecified: Secondary | ICD-10-CM | POA: Diagnosis not present

## 2023-02-07 DIAGNOSIS — E119 Type 2 diabetes mellitus without complications: Secondary | ICD-10-CM | POA: Diagnosis not present

## 2023-02-07 DIAGNOSIS — D649 Anemia, unspecified: Secondary | ICD-10-CM | POA: Diagnosis not present

## 2023-02-11 DIAGNOSIS — E8770 Fluid overload, unspecified: Secondary | ICD-10-CM | POA: Diagnosis not present

## 2023-02-11 DIAGNOSIS — N2581 Secondary hyperparathyroidism of renal origin: Secondary | ICD-10-CM | POA: Diagnosis not present

## 2023-02-11 DIAGNOSIS — E1140 Type 2 diabetes mellitus with diabetic neuropathy, unspecified: Secondary | ICD-10-CM | POA: Diagnosis not present

## 2023-02-11 DIAGNOSIS — N186 End stage renal disease: Secondary | ICD-10-CM | POA: Diagnosis not present

## 2023-02-11 DIAGNOSIS — D689 Coagulation defect, unspecified: Secondary | ICD-10-CM | POA: Diagnosis not present

## 2023-02-11 DIAGNOSIS — D649 Anemia, unspecified: Secondary | ICD-10-CM | POA: Diagnosis not present

## 2023-02-11 DIAGNOSIS — E119 Type 2 diabetes mellitus without complications: Secondary | ICD-10-CM | POA: Diagnosis not present

## 2023-02-11 DIAGNOSIS — D631 Anemia in chronic kidney disease: Secondary | ICD-10-CM | POA: Diagnosis not present

## 2023-02-11 DIAGNOSIS — Z992 Dependence on renal dialysis: Secondary | ICD-10-CM | POA: Diagnosis not present

## 2023-02-11 DIAGNOSIS — Z4931 Encounter for adequacy testing for hemodialysis: Secondary | ICD-10-CM | POA: Diagnosis not present

## 2023-02-12 DIAGNOSIS — N2581 Secondary hyperparathyroidism of renal origin: Secondary | ICD-10-CM | POA: Diagnosis not present

## 2023-02-12 DIAGNOSIS — D649 Anemia, unspecified: Secondary | ICD-10-CM | POA: Diagnosis not present

## 2023-02-12 DIAGNOSIS — D689 Coagulation defect, unspecified: Secondary | ICD-10-CM | POA: Diagnosis not present

## 2023-02-12 DIAGNOSIS — E8770 Fluid overload, unspecified: Secondary | ICD-10-CM | POA: Diagnosis not present

## 2023-02-12 DIAGNOSIS — E1140 Type 2 diabetes mellitus with diabetic neuropathy, unspecified: Secondary | ICD-10-CM | POA: Diagnosis not present

## 2023-02-12 DIAGNOSIS — Z992 Dependence on renal dialysis: Secondary | ICD-10-CM | POA: Diagnosis not present

## 2023-02-12 DIAGNOSIS — N186 End stage renal disease: Secondary | ICD-10-CM | POA: Diagnosis not present

## 2023-02-12 DIAGNOSIS — E119 Type 2 diabetes mellitus without complications: Secondary | ICD-10-CM | POA: Diagnosis not present

## 2023-02-12 DIAGNOSIS — Z4931 Encounter for adequacy testing for hemodialysis: Secondary | ICD-10-CM | POA: Diagnosis not present

## 2023-02-12 DIAGNOSIS — D631 Anemia in chronic kidney disease: Secondary | ICD-10-CM | POA: Diagnosis not present

## 2023-02-13 DIAGNOSIS — Z992 Dependence on renal dialysis: Secondary | ICD-10-CM | POA: Diagnosis not present

## 2023-02-13 DIAGNOSIS — N186 End stage renal disease: Secondary | ICD-10-CM | POA: Diagnosis not present

## 2023-02-13 DIAGNOSIS — N281 Cyst of kidney, acquired: Secondary | ICD-10-CM | POA: Diagnosis not present

## 2023-02-14 DIAGNOSIS — D631 Anemia in chronic kidney disease: Secondary | ICD-10-CM | POA: Diagnosis not present

## 2023-02-14 DIAGNOSIS — N2581 Secondary hyperparathyroidism of renal origin: Secondary | ICD-10-CM | POA: Diagnosis not present

## 2023-02-14 DIAGNOSIS — E1140 Type 2 diabetes mellitus with diabetic neuropathy, unspecified: Secondary | ICD-10-CM | POA: Diagnosis not present

## 2023-02-14 DIAGNOSIS — N186 End stage renal disease: Secondary | ICD-10-CM | POA: Diagnosis not present

## 2023-02-14 DIAGNOSIS — E8770 Fluid overload, unspecified: Secondary | ICD-10-CM | POA: Diagnosis not present

## 2023-02-14 DIAGNOSIS — Z23 Encounter for immunization: Secondary | ICD-10-CM | POA: Diagnosis not present

## 2023-02-14 DIAGNOSIS — Z992 Dependence on renal dialysis: Secondary | ICD-10-CM | POA: Diagnosis not present

## 2023-02-14 DIAGNOSIS — E119 Type 2 diabetes mellitus without complications: Secondary | ICD-10-CM | POA: Diagnosis not present

## 2023-02-14 DIAGNOSIS — D689 Coagulation defect, unspecified: Secondary | ICD-10-CM | POA: Diagnosis not present

## 2023-02-14 DIAGNOSIS — D649 Anemia, unspecified: Secondary | ICD-10-CM | POA: Diagnosis not present

## 2023-03-01 ENCOUNTER — Emergency Department (HOSPITAL_COMMUNITY): Payer: HMO

## 2023-03-01 ENCOUNTER — Emergency Department (HOSPITAL_COMMUNITY)
Admission: EM | Admit: 2023-03-01 | Discharge: 2023-03-01 | Disposition: A | Discharge status: Home / Self Care | Payer: HMO

## 2023-03-01 ENCOUNTER — Encounter (HOSPITAL_COMMUNITY): Payer: Self-pay

## 2023-03-01 ENCOUNTER — Other Ambulatory Visit: Payer: Self-pay

## 2023-03-01 DIAGNOSIS — R0789 Other chest pain: Secondary | ICD-10-CM | POA: Diagnosis not present

## 2023-03-01 DIAGNOSIS — E1122 Type 2 diabetes mellitus with diabetic chronic kidney disease: Secondary | ICD-10-CM | POA: Insufficient documentation

## 2023-03-01 DIAGNOSIS — Z992 Dependence on renal dialysis: Secondary | ICD-10-CM | POA: Diagnosis not present

## 2023-03-01 DIAGNOSIS — Z79899 Other long term (current) drug therapy: Secondary | ICD-10-CM | POA: Diagnosis not present

## 2023-03-01 DIAGNOSIS — R791 Abnormal coagulation profile: Secondary | ICD-10-CM | POA: Insufficient documentation

## 2023-03-01 DIAGNOSIS — R1011 Right upper quadrant pain: Secondary | ICD-10-CM | POA: Diagnosis not present

## 2023-03-01 DIAGNOSIS — N281 Cyst of kidney, acquired: Secondary | ICD-10-CM | POA: Diagnosis not present

## 2023-03-01 DIAGNOSIS — N186 End stage renal disease: Secondary | ICD-10-CM | POA: Diagnosis not present

## 2023-03-01 DIAGNOSIS — Z7982 Long term (current) use of aspirin: Secondary | ICD-10-CM | POA: Diagnosis not present

## 2023-03-01 DIAGNOSIS — R079 Chest pain, unspecified: Secondary | ICD-10-CM | POA: Diagnosis not present

## 2023-03-01 DIAGNOSIS — Z9049 Acquired absence of other specified parts of digestive tract: Secondary | ICD-10-CM | POA: Diagnosis not present

## 2023-03-01 DIAGNOSIS — R0602 Shortness of breath: Secondary | ICD-10-CM | POA: Diagnosis not present

## 2023-03-01 DIAGNOSIS — I120 Hypertensive chronic kidney disease with stage 5 chronic kidney disease or end stage renal disease: Secondary | ICD-10-CM | POA: Insufficient documentation

## 2023-03-01 DIAGNOSIS — I700 Atherosclerosis of aorta: Secondary | ICD-10-CM | POA: Diagnosis not present

## 2023-03-01 LAB — BASIC METABOLIC PANEL
Anion gap: 12 (ref 5–15)
BUN: 54 mg/dL — ABNORMAL HIGH (ref 8–23)
CO2: 19 mmol/L — ABNORMAL LOW (ref 22–32)
Calcium: 8.4 mg/dL — ABNORMAL LOW (ref 8.9–10.3)
Chloride: 101 mmol/L (ref 98–111)
Creatinine, Ser: 8.5 mg/dL — ABNORMAL HIGH (ref 0.44–1.00)
GFR, Estimated: 5 mL/min — ABNORMAL LOW (ref >60)
Glucose, Bld: 133 mg/dL — ABNORMAL HIGH (ref 70–99)
Potassium: 5.3 mmol/L — ABNORMAL HIGH (ref 3.5–5.1)
Sodium: 132 mmol/L — ABNORMAL LOW (ref 135–145)

## 2023-03-01 LAB — HEPATIC FUNCTION PANEL
ALT: 65 U/L — ABNORMAL HIGH (ref 0–44)
AST: 75 U/L — ABNORMAL HIGH (ref 15–41)
Albumin: 3.3 g/dL — ABNORMAL LOW (ref 3.5–5.0)
Alkaline Phosphatase: 133 U/L — ABNORMAL HIGH (ref 38–126)
Bilirubin, Direct: 0.3 mg/dL — ABNORMAL HIGH (ref 0.0–0.2)
Indirect Bilirubin: 0.6 mg/dL (ref 0.3–0.9)
Total Bilirubin: 0.9 mg/dL (ref 0.0–1.2)
Total Protein: 7 g/dL (ref 6.5–8.1)

## 2023-03-01 LAB — TROPONIN I (HIGH SENSITIVITY)
Troponin I (High Sensitivity): 8 ng/L (ref <18)
Troponin I (High Sensitivity): 6 ng/L (ref <18)

## 2023-03-01 LAB — CBC
HCT: 38.5 % (ref 36.0–46.0)
Hemoglobin: 12.2 g/dL (ref 12.0–15.0)
MCH: 29.3 pg (ref 26.0–34.0)
MCHC: 31.7 g/dL (ref 30.0–36.0)
MCV: 92.5 fL (ref 80.0–100.0)
Platelets: 191 10*3/uL (ref 150–400)
RBC: 4.16 MIL/uL (ref 3.87–5.11)
RDW: 15.7 % — ABNORMAL HIGH (ref 11.5–15.5)
WBC: 5.3 10*3/uL (ref 4.0–10.5)
nRBC: 0 % (ref 0.0–0.2)

## 2023-03-01 LAB — D-DIMER, QUANTITATIVE: D-Dimer, Quant: 1.7 ug{FEU}/mL — ABNORMAL HIGH (ref 0.00–0.50)

## 2023-03-01 LAB — POTASSIUM: Potassium: 4.9 mmol/L (ref 3.5–5.1)

## 2023-03-01 LAB — LIPASE, BLOOD: Lipase: 42 U/L (ref 11–51)

## 2023-03-01 MED ORDER — ACETAMINOPHEN 325 MG PO TABS
650.0000 mg | ORAL_TABLET | Freq: Once | ORAL | Status: AC
  Administered 2023-03-01: 650 mg via ORAL
  Filled 2023-03-01: qty 2

## 2023-03-01 MED ORDER — ASPIRIN 81 MG PO CHEW
324.0000 mg | CHEWABLE_TABLET | Freq: Once | ORAL | Status: AC
  Administered 2023-03-01: 324 mg via ORAL
  Filled 2023-03-01: qty 4

## 2023-03-01 MED ORDER — IOHEXOL 350 MG/ML SOLN
75.0000 mL | Freq: Once | INTRAVENOUS | Status: AC | PRN
  Administered 2023-03-01: 75 mL via INTRAVENOUS

## 2023-03-01 MED ORDER — ONDANSETRON HCL 4 MG/2ML IJ SOLN
4.0000 mg | Freq: Once | INTRAMUSCULAR | Status: AC
  Administered 2023-03-01: 4 mg via INTRAVENOUS
  Filled 2023-03-01: qty 2

## 2023-03-01 NOTE — ED Provider Notes (Signed)
[place] [facility] AT [facility] Provider Note   CSN: [id number] Arrival date & time: 03/01/23  0900     History  Chief Complaint  Patient presents with   Chest Pain    Margaret Huerta is a 78 y.o. female with a history significant for end-stage renal disease on home HD, type 2 diabetes, hypertension, hyperlipidemia, also states she currently has liver congestion, with recent liver biopsy pending results.  Presenting with a 1 hour history of midsternal chest pain, described as sharp, woke her from sleep and is associated with shortness of breath and pain with deep inspiration.  Her pain radiates into her left neck region.  She took 3 nitroglycerin prior to arrival which did not improve her symptoms.  She also tried an antacid as she originally thought perhaps this was reflux, this was also not helpful.  She does endorse nausea without emesis, no palpitations, no diaphoresis.  Review of chart indicating normal echocardiogram 3 months ago, negative heart caths most recently in 2016, she had a nuclear medicine stress test June 2021 however showing minimal apical disease.  Of note, patient is supersensitive to narcotics, states the only pain medicine she is willing to take is Tylenol, she is given a dose here.  The history is provided by the patient.       Home Medications Prior to Admission medications   Medication Sig Start Date End Date Taking? Authorizing Provider  acetaminophen (TYLENOL) 500 MG tablet Take 1,000 mg by mouth every 6 (six) hours as needed (for pain.).   Yes [provider]  allopurinol (ZYLOPRIM) 300 MG tablet Take 300 mg by mouth in the morning. 05/09/17  Yes [provider]  ALPRAZolam Prudy Feeler) 0.5 MG tablet Take by mouth.   Yes [provider]  aspirin EC 81 MG tablet Take 81 mg by mouth in the morning. Swallow whole.   Yes [provider]  atenolol (TENORMIN) 50 MG tablet Take 0.5 tablets (25 mg total) by  mouth 2 (two) times daily. 06/29/22  Yes BranchDorothe Pea, MD  calcitRIOL (ROCALTROL) 0.25 MCG capsule Take 0.25 mcg by mouth in the morning.   Yes [provider]  Cholecalciferol (VITAMIN D3) 250 MCG (10000 UT) TABS Take 10,000 Units by mouth in the morning.   Yes [provider]  cinacalcet (SENSIPAR) 30 MG tablet Take 30 mg by mouth See admin instructions. Take 1 tablet by mouth on Monday,Wednesday and Friday   Yes [provider]  DULoxetine (CYMBALTA) 60 MG capsule Take 60 mg by mouth in the morning.   Yes [provider]  gabapentin (NEURONTIN) 100 MG capsule Take 100 mg by mouth daily as needed.   Yes [provider]  hyoscyamine (LEVSIN) 0.125 MG tablet Take 1 tablet (0.125 mg total) by mouth every 8 (eight) hours as needed (abdominal pain). 10/18/22  Yes Dolores Frame, MD  isosorbide dinitrate (ISORDIL) 10 MG tablet Take 1 tablet by mouth twice daily 02/01/23  Yes Branch, Dorothe Pea, MD  Methoxy PEG-Epoetin Beta (MIRCERA IJ) Inject into the skin. As needed 11/28/22 11/27/23 Yes [provider]  midodrine (PROAMATINE) 5 MG tablet Take 5 mg by mouth See admin instructions. Take 1 tablet 30 minutes prior to dialysis and then take 1 tablet half way through dialysis 01/17/23  Yes [provider]  NITROSTAT 0.4 MG SL tablet Place 1 tablet (0.4 mg total) under the tongue every 5 (five) minutes x 3 doses as needed. 09/24/22  Yes  Antoine Poche, MD  rosuvastatin (CRESTOR) 10 MG tablet Take 1 tablet (10 mg total) by mouth daily. 12/01/19  Yes Netta Neat., NP  sevelamer carbonate (RENVELA) 800 MG tablet Take 2,400 mg by mouth 3 (three) times daily. 09/13/21  Yes [provider]  sodium bicarbonate 650 MG tablet Take 650 mg by mouth 2 (two) times daily. 04/15/21  Yes [provider]      Allergies    Demerol [meperidine], Lyrica [pregabalin], Vicodin [hydrocodone-acetaminophen], Ace inhibitors, Aleve  [naproxen], Codeine, Contrast media [iodinated contrast media], Fentanyl, Gluten meal, Lactose intolerance (gi), Morphine, Neurontin [gabapentin], Norvasc [amlodipine], Nsaids, Other, and Dilaudid [hydromorphone hcl]    Review of Systems   Review of Systems  Constitutional:  Negative for chills and fever.  HENT:  Negative for congestion and sore throat.   Eyes: Negative.   Respiratory:  Positive for shortness of breath. Negative for chest tightness.        No acute unilateral leg swelling, she did have a fall several weeks ago with a sustained contusion to her left lateral upper thigh.  Cardiovascular:  Positive for chest pain. Negative for palpitations and leg swelling.  Gastrointestinal:  Positive for nausea. Negative for abdominal pain and vomiting.  Genitourinary: Negative.   Musculoskeletal:  Negative for arthralgias, joint swelling and neck pain.  Skin: Negative.  Negative for rash and wound.  Neurological:  Negative for dizziness, weakness, light-headedness, numbness and headaches.  Psychiatric/Behavioral: Negative.      Physical Exam Updated Vital Signs BP 124/64   Pulse 66   Temp 98.3 F (36.8 C) (Oral)   Resp (!) 22   Ht 5\' 2"  (1.575 m)   Wt 74 kg   SpO2 93%   BMI 29.84 kg/m  Physical Exam Vitals and nursing note reviewed.  Constitutional:      Appearance: She is well-developed. She is not ill-appearing or diaphoretic.     Comments: Appears uncomfortable  HENT:     Head: Normocephalic and atraumatic.  Eyes:     Conjunctiva/sclera: Conjunctivae normal.  Cardiovascular:     Rate and Rhythm: Normal rate and regular rhythm.     Heart sounds: Normal heart sounds.  Pulmonary:     Effort: Pulmonary effort is normal.     Breath sounds: Normal breath sounds. No decreased breath sounds or wheezing.  Abdominal:     General: Bowel sounds are normal.     Palpations: Abdomen is soft.     Tenderness: There is abdominal tenderness in the right upper quadrant.   Musculoskeletal:        General: Normal range of motion.     Cervical back: Normal range of motion.     Right lower leg: No edema.     Left lower leg: No edema.  Skin:    General: Skin is warm and dry.  Neurological:     Mental Status: She is alert.     ED Results / Procedures / Treatments   Labs (all labs ordered are listed, but only abnormal results are displayed) Labs Reviewed  BASIC METABOLIC PANEL - Abnormal; Notable for the following components:      Result Value   Sodium 132 (*)    Potassium 5.3 (*)    CO2 19 (*)    Glucose, Bld 133 (*)    BUN 54 (*)    Creatinine, Ser 8.50 (*)    Calcium 8.4 (*)    GFR, Estimated 5 (*)    All other components within  normal limits  CBC - Abnormal; Notable for the following components:   RDW 15.7 (*)    All other components within normal limits  D-DIMER, QUANTITATIVE - Abnormal; Notable for the following components:   D-Dimer, Quant 1.70 (*)    All other components within normal limits  HEPATIC FUNCTION PANEL - Abnormal; Notable for the following components:   Albumin 3.3 (*)    AST 75 (*)    ALT 65 (*)    Alkaline Phosphatase 133 (*)    Bilirubin, Direct 0.3 (*)    All other components within normal limits  LIPASE, BLOOD  POTASSIUM  TROPONIN I (HIGH SENSITIVITY)  TROPONIN I (HIGH SENSITIVITY)    EKG EKG Interpretation Date/Time:  Friday March 01 2023 09:06:52 EST Ventricular Rate:  63 PR Interval:  138 QRS Duration:  76 QT Interval:  426 QTC Calculation: 433 R Axis:   40  Text Interpretation: Sinus rhythm Low voltage, precordial leads Nonspecific T abnormalities, lateral leads Confirmed by Estanislado Pandy [phone]) on 03/01/2023 9:10:58 AM  Radiology CT Angio Chest PE W and/or Wo Contrast Result Date: 03/01/2023 CLINICAL DATA:  Pulmonary embolism (PE) suspected, high prob. Left-sided chest pain. EXAM: CT ANGIOGRAPHY CHEST WITH CONTRAST TECHNIQUE: Multidetector CT imaging of the chest was performed using the standard  protocol during bolus administration of intravenous contrast. Multiplanar CT image reconstructions and MIPs were obtained to evaluate the vascular anatomy. RADIATION DOSE REDUCTION: This exam was performed according to the departmental dose-optimization program which includes automated exposure control, adjustment of the mA and/or kV according to patient size and/or use of iterative reconstruction technique. CONTRAST:  75mL OMNIPAQUE IOHEXOL 350 MG/ML SOLN COMPARISON:  CT scan chest from 05/30/2017. FINDINGS: Cardiovascular: No evidence of embolism to the proximal subsegmental pulmonary artery level. Normal cardiac size. No pericardial effusion. No aortic aneurysm. There are mild peripheral atherosclerotic vascular calcifications of thoracic aorta and its major branches. Mediastinum/Nodes: Visualized thyroid gland appears grossly unremarkable. No solid / cystic mediastinal masses. The esophagus is nondistended precluding optimal assessment. There is mild circumferential thickening of the lower thoracic esophagus, which is most likely seen in the settings of chronic gastroesophageal reflux disease versus esophagitis. No axillary, mediastinal or hilar lymphadenopathy by size criteria. Lungs/Pleura: The central tracheo-bronchial tree is patent. There are patchy areas of linear, plate-like atelectasis and/or scarring throughout bilateral lungs. There are also dependent changes in bilateral lungs. No mass or consolidation. No pleural effusion or pneumothorax. No suspicious lung nodules. Upper Abdomen: Postsurgical changes from prior gastric bypass noted. Surgically absent gallbladder. There is a nearly completely exophytic cyst arising from the left kidney upper pole, posteriorly. Remaining visualized upper abdominal viscera within normal limits. Musculoskeletal: Right IJ hemodialysis catheter noted with its tip in the right atrium. The visualized soft tissues of the chest wall are grossly unremarkable. No suspicious  osseous lesions. There are mild multilevel degenerative changes in the visualized spine. Review of the MIP images confirms the above findings. IMPRESSION: 1. No embolism to the proximal subsegmental pulmonary artery level. 2. No lung mass, consolidation, pleural effusion or pneumothorax. No suspicious lung nodule. 3. Multiple other nonacute observations, as described above. Aortic Atherosclerosis (ICD10-I70.0). Electronically Signed   By: Jules Schick M.D.   On: 03/01/2023 13:23   DG Chest Port 1 View Result Date: 03/01/2023 CLINICAL DATA:  Chest pain.  Shortness of breath. EXAM: PORTABLE CHEST 1 VIEW COMPARISON:  Chest radiograph dated Jun 23, 2022. FINDINGS: The heart size and mediastinal contours are within normal limits. Right-sided CVC catheter in  similar position. No focal consolidation, sizeable pleural effusion, or pneumothorax. No acute osseous abnormality. IMPRESSION: No acute cardiopulmonary findings. Electronically Signed   By: Hart Robinsons M.D.   On: 03/01/2023 09:52    Procedures Procedures    Medications Ordered in ED Medications  aspirin chewable tablet 324 mg (324 mg Oral Given 03/01/23 0927)  acetaminophen (TYLENOL) tablet 650 mg (650 mg Oral Given 03/01/23 0933)  ondansetron (ZOFRAN) injection 4 mg (4 mg Intravenous Given 03/01/23 1112)  iohexol (OMNIPAQUE) 350 MG/ML injection 75 mL (75 mLs Intravenous Contrast Given 03/01/23 1121)    ED Course/ Medical Decision Making/ A&P             HEART Score: 5                    Medical Decision Making Pt woke with sharp midsternal chest pain, pleuritic with sob prior to arrival,  ddx including ACS, PE, acute bronchitis, pneumonia, pleurisy, msk source.   Labs and imaging reassuring , although initial d dimer elevated,  Ct angio chest negative for acute chest findings, no PE,  no subtle pneumonia, or fluid collections.   Pt with a moderate heart score at 5 but with negative troponins,  stable EKG ,  also negative PE or other  thoracic findings on CT angio chest.    Amount and/or Complexity of Data Reviewed Labs: ordered.    Details: Labs are stable includingTroponins, initialShe had an elevated potassium level at 5.3,I suspect this was a hemolyzed speciman since her repeat potassium was 4.9.Her hepatic function panel abnormal AST is 75 and ALT of 65, these numbers are Consistent with her recent Hepatic Enzymes and underchanged per patient report.   Radiology: ordered.    Details: Ct angio chest reviewed,  negative for acute findings. Discussion of management or test interpretation with external provider(s): Pt discussed Dr Wyline Mood including sx, lab results and CT imaging.  Pt does not require admission,  query MSK / inflammatory source.  Will f/u outpt within several week.   Risk OTC drugs.           Final Clinical Impression(s) / ED Diagnoses Final diagnoses:  Atypical chest pain    Rx / DC Orders ED Discharge Orders     None         Victoriano Lain 03/01/23 1429    Coral Spikes, DO 03/01/23 1617

## 2023-03-01 NOTE — ED Notes (Signed)
Patient transported to CT 

## 2023-03-01 NOTE — ED Notes (Signed)
X-Ray at bedside.

## 2023-03-01 NOTE — ED Triage Notes (Signed)
Pt arrived via POV c/o left chest pain that woke her up. Pt reports thinking it may be heartburn, so she took Tums w/o relief. Pt then reports she took 3 NTG PTA w/o relief. Pt present SOB, and endorses some nausea.

## 2023-03-01 NOTE — ED Notes (Signed)
During Triage, Pt reports her chest pain has begun radiating to her neck and throat.

## 2023-03-01 NOTE — Discharge Instructions (Signed)
Your lab tests, EKG and CT chest are all negative today suggesting this may be inflammation such as chest wall pain as the source of your symptoms.  Continue using tylenol as needed for pain relief.  Plan recheck  with your cardiologist within the next several weeks.  Return here for any worsening symptoms.

## 2023-03-01 NOTE — ED Notes (Signed)
Patient resting in bed, just got back from CT. VSS. A/Ox4. Pain comes and goes rating it a 3/10 intermittently in chest & up neck. HD for 18 months now, 4 days a week. Has HD machine @ home.

## 2023-03-01 NOTE — ED Notes (Signed)
ED Provider at bedside. 

## 2023-03-12 DIAGNOSIS — E1129 Type 2 diabetes mellitus with other diabetic kidney complication: Secondary | ICD-10-CM | POA: Diagnosis not present

## 2023-03-16 DIAGNOSIS — N281 Cyst of kidney, acquired: Secondary | ICD-10-CM | POA: Diagnosis not present

## 2023-03-16 DIAGNOSIS — Z992 Dependence on renal dialysis: Secondary | ICD-10-CM | POA: Diagnosis not present

## 2023-03-16 DIAGNOSIS — N186 End stage renal disease: Secondary | ICD-10-CM | POA: Diagnosis not present

## 2023-03-18 DIAGNOSIS — N186 End stage renal disease: Secondary | ICD-10-CM | POA: Diagnosis not present

## 2023-03-18 DIAGNOSIS — E1140 Type 2 diabetes mellitus with diabetic neuropathy, unspecified: Secondary | ICD-10-CM | POA: Diagnosis not present

## 2023-03-18 DIAGNOSIS — Z992 Dependence on renal dialysis: Secondary | ICD-10-CM | POA: Diagnosis not present

## 2023-03-18 DIAGNOSIS — E8770 Fluid overload, unspecified: Secondary | ICD-10-CM | POA: Diagnosis not present

## 2023-03-18 DIAGNOSIS — D509 Iron deficiency anemia, unspecified: Secondary | ICD-10-CM | POA: Diagnosis not present

## 2023-03-18 DIAGNOSIS — N2581 Secondary hyperparathyroidism of renal origin: Secondary | ICD-10-CM | POA: Diagnosis not present

## 2023-03-18 DIAGNOSIS — E119 Type 2 diabetes mellitus without complications: Secondary | ICD-10-CM | POA: Diagnosis not present

## 2023-03-18 DIAGNOSIS — D631 Anemia in chronic kidney disease: Secondary | ICD-10-CM | POA: Diagnosis not present

## 2023-03-18 DIAGNOSIS — D689 Coagulation defect, unspecified: Secondary | ICD-10-CM | POA: Diagnosis not present

## 2023-03-20 DIAGNOSIS — N186 End stage renal disease: Secondary | ICD-10-CM | POA: Diagnosis not present

## 2023-03-20 DIAGNOSIS — F0150 Vascular dementia without behavioral disturbance: Secondary | ICD-10-CM | POA: Diagnosis not present

## 2023-03-20 DIAGNOSIS — E1122 Type 2 diabetes mellitus with diabetic chronic kidney disease: Secondary | ICD-10-CM | POA: Diagnosis not present

## 2023-03-20 DIAGNOSIS — I959 Hypotension, unspecified: Secondary | ICD-10-CM | POA: Diagnosis not present

## 2023-03-24 NOTE — Progress Notes (Deleted)
   Cardiology Office Note    Date:  03/24/2023  ID:  Margaret Huerta, DOB 29-Mar-1945, MRN [id number] PCP:  Carylon Perches, MD  Cardiologist:  Dina Rich, MD  Electrophysiologist:  None   Chief Complaint: ***  History of Present Illness: .    Margaret Huerta is a 78 y.o. female with visit-pertinent history of chest pain, HTN with hx of orthostasis and low BP at HD, HLD, ESRD on HD, DM2, OSA (not comfortable with CPAP), orthostatic syncope, elevated liver enzymes seen for ER follow-up. Per notes patient has long history of chest pain and had [facility] in 2003 and 2016 for abnormal stress test, both showing normal coronary arteries. Last nuc 2021 showed mild apical ischemia but felt to be low risk and benign by Dr. Wyline Mood; some CP occuring in context of HD, complicated by low BP in the past. 2D echo 11/2022 EF 60-65%, G1DD. Had ER visit 02/2023 for atypical chest pain with negative troponins, D-dimer elevated; CTA no PE, noncardiac ancillary nonacute findings seen.  See pcp for other findings, esophagitis?  Chronic chest pain Essential HTN with h/o orthostasis ESRD on HD HLD   Labwork independently reviewed: 02/2023 K 4.9 recheck OK, trops ok, lipase ok, LFTs abnormal with possible hemolysis, Hgb 12.2, plt 1911 08/2022 tsh ok No recent lipids  ROS: .    Please see the history of present illness. Otherwise, review of systems is positive for ***.  All other systems are reviewed and otherwise negative.  Studies Reviewed: Marland Kitchen    EKG:  EKG is ordered today, personally reviewed, demonstrating ***  CV Studies: Cardiac studies reviewed are outlined and summarized above. Otherwise please see EMR for full report.   Current Reported Medications:.    No outpatient medications have been marked as taking for the 03/25/23 encounter (Appointment) with Laurann Montana, PA-C.    Physical Exam:    VS:  There were no vitals taken for this visit.   Wt Readings from Last 3 Encounters:  03/01/23 163 lb 2.3 oz (74  kg)  12/25/22 163 lb 9.6 oz (74.2 kg)  12/11/22 168 lb 12.8 oz (76.6 kg)    GEN: Well nourished, well developed in no acute distress NECK: No JVD; No carotid bruits CARDIAC: ***RRR, no murmurs, rubs, gallops RESPIRATORY:  Clear to auscultation without rales, wheezing or rhonchi  ABDOMEN: Soft, non-tender, non-distended EXTREMITIES:  No edema; No acute deformity   Asessement and Plan:.     ***     Disposition: F/u with ***  Signed, Laurann Montana, PA-C

## 2023-03-25 ENCOUNTER — Ambulatory Visit: Payer: Self-pay | Admitting: Physician Assistant

## 2023-03-25 DIAGNOSIS — E785 Hyperlipidemia, unspecified: Secondary | ICD-10-CM

## 2023-03-25 DIAGNOSIS — G8929 Other chronic pain: Secondary | ICD-10-CM

## 2023-03-25 DIAGNOSIS — I951 Orthostatic hypotension: Secondary | ICD-10-CM

## 2023-03-25 DIAGNOSIS — I10 Essential (primary) hypertension: Secondary | ICD-10-CM

## 2023-03-25 DIAGNOSIS — N186 End stage renal disease: Secondary | ICD-10-CM

## 2023-03-25 NOTE — Progress Notes (Unsigned)
Margaret Huerta, female    DOB: 1945/12/24    MRN: [id number]   Brief patient profile:  47  yowf never smoker on home  HD since Aug 2023  referred to pulmonary clinic in [place]  03/27/2023 by Dr Wolfgang Phoenix  for midline cp worse with deep breath 03/01/23 woke up with 10/10.  Had been having trouble with  RUQ dx liver congestion x 10/2022 > bx 11/28/22 showing congestive hepatopathy > echo 12/12/22 with Mild MR and nl RA pressures.     History of Present Illness  03/27/2023  Pulmonary/ 1st office eval/ Danayah Smyre / [place] [facility]  No chief complaint on file. Cp still 2-6 to 10 continues worse at hs  Dyspnea:  does [employer] shopping x one week prior to OV  walking with cane Limited by light headed  Cough: a week after onset of cp cough started > white assoc with chronic dyshagia with neg egd 9//3/24  Sleep:  30 degrees and pillows so has to sit up but not as high as at onset of cp  Does ok p xanax / tylenol  02: none     No obvious day to day or daytime pattern/variability or assoc excess/ purulent sputum or mucus plugs or hemoptysis or cp or chest tightness, subjective wheeze or overt sinus or hb symptoms.    Also denies any obvious fluctuation of symptoms with weather or environmental changes or other aggravating or alleviating factors except as outlined above   No unusual exposure hx or h/o childhood pna/ asthma or knowledge of premature birth.  Current Allergies, Complete Past Medical History, Past Surgical History, Family History, and Social History were reviewed in [employer] record.  ROS  The following are not active complaints unless bolded Hoarseness, sore throat, dysphagia, dental problems, itching, sneezing,  nasal congestion or discharge of excess mucus or purulent secretions, ear ache,   fever, chills, sweats, unintended wt loss or wt gain, classically pleuritic or exertional cp,  orthopnea pnd or arm/hand swelling  or leg swelling, presyncope,  palpitations, abdominal pain, anorexia, nausea, vomiting, diarrhea  or change in bowel habits or change in bladder habits, change in stools or change in urine, dysuria, hematuria,  rash, arthralgias, visual complaints, headache, numbness, weakness or ataxia or problems with walking or coordination,  change in mood or  memory.            Outpatient Medications Prior to Visit  Medication Sig Dispense Refill   acetaminophen (TYLENOL) 500 MG tablet Take 1,000 mg by mouth every 6 (six) hours as needed (for pain.).     allopurinol (ZYLOPRIM) 300 MG tablet Take 300 mg by mouth in the morning.  12   ALPRAZolam (XANAX) 0.5 MG tablet Take by mouth.     aspirin EC 81 MG tablet Take 81 mg by mouth in the morning. Swallow whole.     atenolol (TENORMIN) 50 MG tablet Take 0.5 tablets (25 mg total) by mouth 2 (two) times daily. 90 tablet 1   calcitRIOL (ROCALTROL) 0.25 MCG capsule Take 0.25 mcg by mouth in the morning.     Cholecalciferol (VITAMIN D3) 250 MCG (10000 UT) TABS Take 10,000 Units by mouth in the morning.     cinacalcet (SENSIPAR) 30 MG tablet Take 30 mg by mouth See admin instructions. Take 1 tablet by mouth on Monday,Wednesday and Friday     DULoxetine (CYMBALTA) 60 MG capsule Take 60 mg by mouth in the morning.     gabapentin (NEURONTIN) 100  MG capsule Take 100 mg by mouth daily as needed.     hyoscyamine (LEVSIN) 0.125 MG tablet Take 1 tablet (0.125 mg total) by mouth every 8 (eight) hours as needed (abdominal pain). 60 tablet 1   isosorbide dinitrate (ISORDIL) 10 MG tablet Take 1 tablet by mouth twice daily 180 tablet 0   Methoxy PEG-Epoetin Beta (MIRCERA IJ) Inject into the skin. As needed     midodrine (PROAMATINE) 5 MG tablet Take 5 mg by mouth See admin instructions. Take 1 tablet 30 minutes prior to dialysis and then take 1 tablet half way through dialysis     NITROSTAT 0.4 MG SL tablet Place 1 tablet (0.4 mg total) under the tongue every 5 (five) minutes x 3 doses as needed. 25 tablet 3    rosuvastatin (CRESTOR) 10 MG tablet Take 1 tablet (10 mg total) by mouth daily.     sevelamer carbonate (RENVELA) 800 MG tablet Take 2,400 mg by mouth 3 (three) times daily.     sodium bicarbonate 650 MG tablet Take 650 mg by mouth 2 (two) times daily.     No facility-administered medications prior to visit.    Past Medical History:  Diagnosis Date   Anemia    Anxiety    Cervical cancer (HCC)    Chronic bronchitis (HCC)    "get it q yr"   Chronic lower back pain    Depression    Febrile seizure (HCC)    "as a child"   Fibromyalgia    GERD (gastroesophageal reflux disease)    Gout    Hemodialysis patient (HCC)    History of blood transfusion    "S/P tonsillectomy"   History of hiatal hernia    Hx of cardiovascular stress test 05/14/2014   false positive Myoview   Hyperlipidemia    Hypertension    Migraine    "monthly" (05/26/2014)   Neuropathy    Osteoporosis    Parathyroid disease (HCC)    "my PHT levels run high; I can't take calcium"   Pneumonia "several times"   Renal cancer (HCC)    "right"-s/p nephrectomy   Renal insufficiency    "left kidney works at 40-60%" (05/26/2014)   Sleep apnea    "wore mask for 2 months; could not take it" (05/26/2014)   Type II diabetes mellitus (HCC)    Typhus fever    "as a child"      Objective:     There were no vitals taken for this visit.         Assessment   No problem-specific Assessment & Plan notes found for this encounter.     Sandrea Hughs, MD 03/25/2023

## 2023-03-27 ENCOUNTER — Encounter: Payer: Self-pay | Admitting: Internal Medicine

## 2023-03-27 ENCOUNTER — Ambulatory Visit: Payer: HMO | Admitting: Internal Medicine

## 2023-03-27 VITALS — BP 125/77 | HR 64 | Ht 62.0 in | Wt 166.0 lb

## 2023-03-27 DIAGNOSIS — R0609 Other forms of dyspnea: Secondary | ICD-10-CM

## 2023-03-27 MED ORDER — PREDNISONE 10 MG PO TABS: ORAL_TABLET | ORAL | 0 refills | Status: AC

## 2023-03-27 MED ORDER — FAMOTIDINE 20 MG PO TABS: ORAL_TABLET | ORAL | 11 refills | Status: DC

## 2023-03-27 MED ORDER — PANTOPRAZOLE SODIUM 40 MG PO TBEC: 40.0000 mg | DELAYED_RELEASE_TABLET | Freq: Every day | ORAL | 2 refills | Status: DC

## 2023-03-27 NOTE — Patient Instructions (Addendum)
Pantoprazole (protonix) 40 mg   Take  30-60 min before first meal of the day and Pepcid (famotidine)  20 mg after supper until return to office - this is the best way to tell whether stomach acid is contributing to your problem.         Prednisone 10 mg take  4 each am x 2 days,   2 each am x 2 days,  1 each am x 2 days and stop   Please remember to go to the lab department   for your tests - we will call you with the results when they are available.     I will ask Dr Wyline Mood to review your case of possible pericarditis

## 2023-03-28 LAB — CBC WITH DIFFERENTIAL/PLATELET
Basophils Absolute: 0.1 10*3/uL (ref 0.0–0.2)
Basos: 1 %
EOS (ABSOLUTE): 0.2 10*3/uL (ref 0.0–0.4)
Eos: 3 %
Hematocrit: 36.9 % (ref 34.0–46.6)
Hemoglobin: 11.8 g/dL (ref 11.1–15.9)
Immature Grans (Abs): 0 10*3/uL (ref 0.0–0.1)
Immature Granulocytes: 1 %
Lymphocytes Absolute: 1.8 10*3/uL (ref 0.7–3.1)
Lymphs: 29 %
MCH: 29.5 pg (ref 26.6–33.0)
MCHC: 32 g/dL (ref 31.5–35.7)
MCV: 92 fL (ref 79–97)
Monocytes Absolute: 0.8 10*3/uL (ref 0.1–0.9)
Monocytes: 12 %
Neutrophils Absolute: 3.6 10*3/uL (ref 1.4–7.0)
Neutrophils: 54 %
Platelets: 193 10*3/uL (ref 150–450)
RBC: 4 x10E6/uL (ref 3.77–5.28)
RDW: 15.1 % (ref 11.7–15.4)
WBC: 6.4 10*3/uL (ref 3.4–10.8)

## 2023-03-28 LAB — SEDIMENTATION RATE: Sed Rate: 26 mm/h (ref 0–40)

## 2023-03-28 NOTE — Assessment & Plan Note (Addendum)
Abrupt onset 03/01/23 while supine  assoc with acute on chronic dysphagia with esophagitis on CTa chest 03/01/23  -  Echo ok 12/12/22 x for mild MR/ nl RA pressures  with evidence of congestive hepatopathy on Bx  11/28/22  -  ESR 03/27/2023 >>>     Her doe is more of a weakness/ presyncope pattern when she over dialyzes at home but her pain is more typical of pericarditis than esophageal in origin despite the CTa findings of no effusions but evidence of esophagitis  There is no evidence of a significant pulmonary source for her symptoms.  Rec Trial of max gerd rx and start prednisone pending ESR noting her pain started within a week or two of finishing a course of prednisone for gout   > defer whether needs repeat ECHO to Dr Wyline Mood.      Each maintenance medication was reviewed in detail including emphasizing most importantly the difference between maintenance and prns and under what circumstances the prns are to be triggered using an action plan format where appropriate.  Total time for H and P, chart review, counseling,   and generating customized AVS unique to this office visit / same day charting  > 45 min extremely complex HD pt who is new to me

## 2023-04-09 ENCOUNTER — Telehealth: Payer: Self-pay

## 2023-04-09 NOTE — Telephone Encounter (Signed)
 Melissa from Dr. Lucio Edward office called to inform pt's cardiology provider that pt has been having syncopal episodes. Last episode happened on 2/22 where pt blacked out on her porch and fell off of porch. Pt declined to seek medical emergency help. Melissa stated that pt's bp while receiving treatment at Nephrology office has been low and pt' is having to use Midodrine 10 mg ( 30 mins prior to treatment and then again 30 minutes into treatment run) to help keep bp's elevated for treatment. Treatment bp's run in the 120's: 127/73, 128/80, 125/79.   Post treatment bp's: 85-90's systolic and hr upper 50's, low 60's.   Pt has a f/u appt with B. Strader, PA on 3/5.   Please advise.

## 2023-04-13 DIAGNOSIS — N2581 Secondary hyperparathyroidism of renal origin: Secondary | ICD-10-CM | POA: Diagnosis not present

## 2023-04-13 DIAGNOSIS — E119 Type 2 diabetes mellitus without complications: Secondary | ICD-10-CM | POA: Diagnosis not present

## 2023-04-13 DIAGNOSIS — Z992 Dependence on renal dialysis: Secondary | ICD-10-CM | POA: Diagnosis not present

## 2023-04-13 DIAGNOSIS — E1140 Type 2 diabetes mellitus with diabetic neuropathy, unspecified: Secondary | ICD-10-CM | POA: Diagnosis not present

## 2023-04-13 DIAGNOSIS — E8770 Fluid overload, unspecified: Secondary | ICD-10-CM | POA: Diagnosis not present

## 2023-04-13 DIAGNOSIS — Z23 Encounter for immunization: Secondary | ICD-10-CM | POA: Diagnosis not present

## 2023-04-13 DIAGNOSIS — D649 Anemia, unspecified: Secondary | ICD-10-CM | POA: Diagnosis not present

## 2023-04-13 DIAGNOSIS — N281 Cyst of kidney, acquired: Secondary | ICD-10-CM | POA: Diagnosis not present

## 2023-04-13 DIAGNOSIS — N186 End stage renal disease: Secondary | ICD-10-CM | POA: Diagnosis not present

## 2023-04-13 DIAGNOSIS — D631 Anemia in chronic kidney disease: Secondary | ICD-10-CM | POA: Diagnosis not present

## 2023-04-15 NOTE — Telephone Encounter (Signed)
 Noted.

## 2023-04-17 ENCOUNTER — Ambulatory Visit: Payer: Self-pay | Attending: Student | Admitting: Student

## 2023-04-17 ENCOUNTER — Encounter: Payer: Self-pay | Admitting: Student

## 2023-04-17 VITALS — BP 120/84 | HR 66 | Ht 62.0 in | Wt 170.8 lb

## 2023-04-17 DIAGNOSIS — E782 Mixed hyperlipidemia: Secondary | ICD-10-CM | POA: Diagnosis not present

## 2023-04-17 DIAGNOSIS — N186 End stage renal disease: Secondary | ICD-10-CM

## 2023-04-17 DIAGNOSIS — R55 Syncope and collapse: Secondary | ICD-10-CM | POA: Diagnosis not present

## 2023-04-17 DIAGNOSIS — R0781 Pleurodynia: Secondary | ICD-10-CM

## 2023-04-17 DIAGNOSIS — I951 Orthostatic hypotension: Secondary | ICD-10-CM

## 2023-04-17 MED ORDER — MIDODRINE HCL 5 MG PO TABS: 5.0000 mg | ORAL_TABLET | ORAL | 3 refills | Status: AC

## 2023-04-17 NOTE — Progress Notes (Signed)
 Cardiology Office Note    Date:  04/17/2023  ID:  Margaret Huerta, DOB 05/12/1945, MRN [id number] Cardiologist: Dina Rich, MD    History of Present Illness:    Margaret Huerta is a 78 y.o. female with past medical history of chest pain (normal coronary arteries by cardiac catheterizations in 2003 and 2016, low-risk NST in 07/2019), HTN, HLD, Type II DM, orthostatic syncope and ESRD who presents to the office today for follow-up from a recent Emergency Department visit.  She was last examined by Dr. Wyline Mood in 11/2022 and had been experiencing intermittent episodes of hypotension and Hydralazine had recently been stopped and Atenolol lowered to 25 mg twice daily. Orthostatics had been checked during dialysis and SBP was still dropping by 20-30 points. She had been on nitrates for chronic chest pain and low-dose Atenolol for migraine headaches, therefore was recommend to stop Lasix on non-HD days and increase oral hydration. A follow-up echocardiogram was recommended and this showed a preserved EF of 60 to 65% with no regional wall motion abnormalities. She did have mild LVH, grade 1 diastolic dysfunction, normal RV function and trivial MR.  In the interim, she did present to [facility] ED on 03/01/2023 for evaluation of chest pain. Pain lasted for over an hour and did not improve with Tums or nitroglycerin. Hs troponin values were negative and EKG was without acute ST changes. D-dimer was elevated but CTA was negative for PE. She was discharged home and informed to follow-up with Cardiology as an outpatient.  The office did receive messages from Dr. Lucio Edward office in the interim reporting she had a syncopal episode on 04/06/2023 and had been experiencing orthostasis even with the use of Midodrine during treatments.  In talking with the patient today, she reports having chronic chest wall pain for the past few months. This is exacerbated with coughing or deep breathing. No association with exertion.  She does undergo hemodialysis at home 4 days a week and says that she experienced worsening abdominal distention after Lasix was stopped and this was restarted by Dr. Wolfgang Phoenix in the interim at 40 mg twice daily. Says they did increase her dry weight but she is still been having episodes of dizziness and syncope. Reports at least 4 episodes of syncope within the past few months. Episodes occur with positional changes. No associated palpitations with these.  Studies Reviewed:   EKG: EKG is not ordered today. EKG from 03/01/2023 is reviewed and shows normal sinus rhythm, heart rate 63 with baseline artifact. No acute ST changes.  NST: 07/2019 There was no ST segment deviation noted during stress. Findings consistent with mild apical ischemia. This is a low risk study. The left ventricular ejection fraction is hyperdynamic (>65%).  Echocardiogram: 11/2022 IMPRESSIONS     1. Left ventricular ejection fraction, by estimation, is 60 to 65%. The  left ventricle has normal function. The left ventricle has no regional  wall motion abnormalities. There is mild left ventricular hypertrophy.  Left ventricular diastolic parameters  are consistent with Grade I diastolic dysfunction (impaired relaxation).   2. Right ventricular systolic function is normal. The right ventricular  size is normal.   3. The mitral valve is normal in structure. Trivial mitral valve  regurgitation. No evidence of mitral stenosis.   4. The tricuspid valve is abnormal.   5. The aortic valve is tricuspid. There is mild calcification of the  aortic valve. There is mild thickening of the aortic valve. Aortic valve  regurgitation is not  visualized. No aortic stenosis is present.   6. The inferior vena cava is normal in size with greater than 50%  respiratory variability, suggesting right atrial pressure of 3 mmHg.   Comparison(s): EF 60-65%. Mild mitral regurgitation.    Physical Exam:   VS:  BP 120/84 (Cuff Size: Normal)    Pulse 66   Ht 5\' 2"  (1.575 m)   Wt 170 lb 12.8 oz (77.5 kg)   SpO2 98%   BMI 31.24 kg/m    Wt Readings from Last 3 Encounters:  04/17/23 170 lb 12.8 oz (77.5 kg)  03/27/23 166 lb (75.3 kg)  03/01/23 163 lb 2.3 oz (74 kg)     GEN: Well nourished, well developed female appearing in no acute distress NECK: No JVD; No carotid bruits CARDIAC: RRR, no murmurs, rubs, gallops. No friction rub.  RESPIRATORY:  Clear to auscultation without rales, wheezing or rhonchi  ABDOMEN: Appears non-distended. No obvious abdominal masses. EXTREMITIES: No clubbing or cyanosis. No pitting edema.  Distal pedal pulses are 2+ bilaterally.   Assessment and Plan:   1. Syncope - She reports at least 4 syncopal episodes within the past few months and these have occurred with positional changes. She has a history of orthostasis and when rechecked today, SBP dropped by 22 points. She does take Midodrine on the days of dialysis and reports her episodes have occurred on nondialysis days. Given this, I recommended that she try taking Midodrine 5 mg twice daily on nondialysis days. Will defer dose adjustment of Midodrine on dialysis days to Nephrology. If she develops resting hypertension with Midodrine, could reduce Midodrine to 2.5 mg twice daily or may need to try a lower dose of Atenolol (has been on this since the 1980's per her report for headaches). Could also consider a Zio patch in the future but her episodes overall seem atypical for a cardiac arrhythmia at this time.  2. Pleuritic Chest Pain - By review of her chart, she has a longstanding history of chest pain with normal coronary arteries by prior cardiac catheterizations in 2003 and 2016. Most recent ischemic evaluation was a low-risk NST in 07/2019. - Her recent episodes of chest pain overall seem atypical for coronary artery disease as she describes a chronic chest wall pain which been present for over a month and is worse with inspiration or deep breathing.  Hs troponin values checked during ED evaluation were reassuring. Sed rate also checked by Pulmonology and normal. She is concerned as Pulmonology mentioned that she could have pericarditis but history seems atypical for this as well and also less likely given normal sed rate. Reviewed options with the patient and will plan to obtain a limited echocardiogram for reassessment of EF and to rule out a pericardial effusion. No friction rub appreciated by examination.  3. Orthostatic Hypotension - Suspect orthostasis is contributing to her dizziness and syncopal episodes as discussed above. SBP did drop by 20 points today. She remains on Atenolol 25 mg twice daily as she has been on this for headaches and is also on Isordil 10 mg twice daily for chronic chest pain. Will adjust Midodrine as discussed above. If unable to tolerate, may need to consider dose reduction of Atenolol to 25 mg daily.  4. HLD - Followed by her PCP. Remains on Crestor 10 mg daily.  5. ESRD - Followed by Dr. Wolfgang Phoenix and she undergoes dialysis 4 days a week at home. Reports her dry weight has been increased over the past few months. Will  defer continuation of Lasix to Nephrology as she is currently on 40 mg twice daily.  Signed, Ellsworth Lennox, PA-C

## 2023-04-17 NOTE — Patient Instructions (Signed)
 Medication Instructions:  Your physician has recommended you make the following change in your medication:   Add Midodrine 5 mg Two Times Daily on Non-dialysis days   *If you need a refill on your cardiac medications before your next appointment, please call your pharmacy*   Lab Work: NONE   If you have labs (blood work) drawn today and your tests are completely normal, you will receive your results only by: MyChart Message (if you have MyChart) OR A paper copy in the mail If you have any lab test that is abnormal or we need to change your treatment, we will call you to review the results.   Testing/Procedures: Your physician has requested that you have an echocardiogram. Echocardiography is a painless test that uses sound waves to create images of your heart. It provides your doctor with information about the size and shape of your heart and how well your heart's chambers and valves are working. This procedure takes approximately one hour. There are no restrictions for this procedure. Please do NOT wear cologne, perfume, aftershave, or lotions (deodorant is allowed). Please arrive 15 minutes prior to your appointment time.  Please note: We ask at that you not bring children with you during ultrasound (echo/ vascular) testing. Due to room size and safety concerns, children are not allowed in the ultrasound rooms during exams. Our front office staff cannot provide observation of children in our lobby area while testing is being conducted. An adult accompanying a patient to their appointment will only be allowed in the ultrasound room at the discretion of the ultrasound technician under special circumstances. We apologize for any inconvenience.    Follow-Up: At [facility], you and your health needs are our priority.  As part of our continuing mission to provide you with exceptional heart care, we have created designated Provider Care Teams.  These Care Teams include your primary  Cardiologist (physician) and Advanced Practice Providers (APPs -  Physician Assistants and Nurse Practitioners) who all work together to provide you with the care you need, when you need it.  We recommend signing up for the patient portal called "MyChart".  Sign up information is provided on this After Visit Summary.  MyChart is used to connect with patients for Virtual Visits (Telemedicine).  Patients are able to view lab/test results, encounter notes, upcoming appointments, etc.  Non-urgent messages can be sent to your provider as well.   To learn more about what you can do with MyChart, go to [email or web address].    Your next appointment:   3 month(s)  Provider:   You may see Dina Rich, MD or one of the following Advanced Practice Providers on your designated Care Team:   Randall An, PA-C  Jacolyn Reedy, [place]     Other Instructions Thank you for choosing  [facility]!

## 2023-04-18 ENCOUNTER — Other Ambulatory Visit: Payer: Self-pay | Admitting: Cardiology

## 2023-04-22 ENCOUNTER — Other Ambulatory Visit (HOSPITAL_COMMUNITY): Payer: Self-pay | Admitting: Internal Medicine

## 2023-04-22 ENCOUNTER — Ambulatory Visit (HOSPITAL_COMMUNITY)
Admission: RE | Admit: 2023-04-22 | Discharge: 2023-04-22 | Disposition: A | Discharge status: Home / Self Care | Source: Ambulatory Visit | Attending: Internal Medicine | Admitting: Internal Medicine

## 2023-04-22 DIAGNOSIS — Z9181 History of falling: Secondary | ICD-10-CM | POA: Insufficient documentation

## 2023-04-22 DIAGNOSIS — I878 Other specified disorders of veins: Secondary | ICD-10-CM | POA: Diagnosis not present

## 2023-04-22 DIAGNOSIS — M779 Enthesopathy, unspecified: Secondary | ICD-10-CM | POA: Diagnosis not present

## 2023-04-22 DIAGNOSIS — M25511 Pain in right shoulder: Secondary | ICD-10-CM | POA: Diagnosis not present

## 2023-04-22 DIAGNOSIS — M25551 Pain in right hip: Secondary | ICD-10-CM | POA: Diagnosis not present

## 2023-04-22 DIAGNOSIS — M1611 Unilateral primary osteoarthritis, right hip: Secondary | ICD-10-CM | POA: Diagnosis not present

## 2023-04-23 ENCOUNTER — Institutional Professional Consult (permissible substitution): Payer: HMO | Admitting: Internal Medicine

## 2023-04-23 NOTE — Progress Notes (Unsigned)
 Margaret Huerta, female    DOB: 1946-01-19    MRN: [id number]   Brief patient profile:  64  yowf never smoker on home  HD since Aug 2023  referred to pulmonary clinic in [place]  03/27/2023 by Dr Wolfgang Phoenix  for midline cp worse with deep breath 03/01/23 woke up with 10/10.  Had been having trouble with  RUQ dx liver congestion x 10/2022 > bx 11/28/22 showing congestive hepatopathy > echo 12/12/22 with Mild MR and nl RA pressures.     History of Present Illness  03/27/2023  Pulmonary/ 1st office eval/ Oney Folz / [place] [facility]  Chief Complaint  Patient presents with   Consult  Cp still 2-6 to 10 continues worse at hs  Dyspnea:  does [employer] shopping x one week prior to OV  walking with cane Limited by light headed > sob  Cough: a week after onset of cp cough started > white assoc with chronic dyshagia with neg egd 9//3/24 / worse since onset of cp  Sleep:  30 degrees and pillows so has to sit up but not as high as at onset of cp when it was really painful to lie back at all  Now sleeps  ok p xanax / tylenol   Says pain started within a week or two of finishing a course of prednisone for gout  which has resolved 02: none  Rec Pantoprazole (protonix) 40 mg   Take  30-60 min before first meal of the day and Pepcid (famotidine)  20 mg after supper until return to office Prednisone 10 mg take  4 each am x 2 days,   2 each am x 2 days,  1 each am x 2 days and stop   I will ask Dr Wyline Mood to review your case of possible pericarditis    04/24/2023  f/u ov/[place] office/Jahron Hunsinger re: cp maint on gerd rx/ short term presnisone lots better   Chief Complaint  Patient presents with   Follow-up    4 week follow up from short of breath   Dyspnea:  more light headed than doe /  can do 3/4 [employer] leaning on cart  Cough:  most bothersome hs and some noct  (1/week) scan muocoid  Sleeping: 30 degrees s sob/ any less has trouble  breathing and more midline  SABA use: none  02: none     No obvious  day to day or daytime variability or assoc excess/ purulent sputum or mucus plugs or hemoptysis or  subjective wheeze or overt  hb symptoms.    Also denies any obvious fluctuation of symptoms with weather or environmental changes or other aggravating or alleviating factors except as outlined above   No unusual exposure hx or h/o childhood pna/ asthma or knowledge of premature birth.  Current Allergies, Complete Past Medical History, Past Surgical History, Family History, and Social History were reviewed in [employer] record.  ROS  The following are not active complaints unless bolded Hoarseness, sore throat, dysphagia, dental problems, itching, sneezing,  nasal congestion or discharge of excess mucus or purulent secretions, ear ache,   fever, chills, sweats, unintended wt loss or wt gain, classically pleuritic or exertional cp,  orthopnea pnd or arm/hand swelling  or leg swelling, presyncope, palpitations, abdominal pain, anorexia, nausea, vomiting, diarrhea  or change in bowel habits or change in bladder habits, change in stools or change in urine, dysuria, hematuria,  rash, arthralgias, visual complaints, headache, numbness, weakness or ataxia or problems with  walking or coordination,  change in mood or  memory.        Current Meds  Medication Sig   acetaminophen (TYLENOL) 500 MG tablet Take 1,000 mg by mouth every 6 (six) hours as needed (for pain.).   allopurinol (ZYLOPRIM) 300 MG tablet Take 300 mg by mouth in the morning.   ALPRAZolam (XANAX) 0.5 MG tablet Take by mouth.   aspirin EC 81 MG tablet Take 81 mg by mouth in the morning. Swallow whole.   atenolol (TENORMIN) 50 MG tablet Take 0.5 tablets (25 mg total) by mouth 2 (two) times daily.   calcitRIOL (ROCALTROL) 0.25 MCG capsule Take 0.25 mcg by mouth in the morning.   Cholecalciferol (VITAMIN D3) 250 MCG (10000 UT) TABS Take 10,000 Units by mouth in the morning.   cinacalcet (SENSIPAR) 30 MG tablet Take 30 mg by  mouth See admin instructions. Take 1 tablet by mouth on Monday,Wednesday and Friday   DULoxetine (CYMBALTA) 60 MG capsule Take 60 mg by mouth in the morning.   famotidine (PEPCID) 20 MG tablet One after supper   gabapentin (NEURONTIN) 100 MG capsule Take 100 mg by mouth daily as needed.   hyoscyamine (LEVSIN) 0.125 MG tablet Take 1 tablet (0.125 mg total) by mouth every 8 (eight) hours as needed (abdominal pain).   isosorbide dinitrate (ISORDIL) 10 MG tablet Take 1 tablet by mouth twice daily   Methoxy PEG-Epoetin Beta (MIRCERA IJ) Inject into the skin. As needed   midodrine (PROAMATINE) 5 MG tablet Take 1 tablet (5 mg total) by mouth See admin instructions. Take 1 tablet 30 minutes prior to dialysis and then take 1 tablet half way through dialysis. Add Midodrine 5 mg Two Times Daily on Non-dialysis days   NITROSTAT 0.4 MG SL tablet Place 1 tablet (0.4 mg total) under the tongue every 5 (five) minutes x 3 doses as needed.   pantoprazole (PROTONIX) 40 MG tablet Take 1 tablet (40 mg total) by mouth daily. Take 30-60 min before first meal of the day   predniSONE (DELTASONE) 10 MG tablet Take  4 each am x 2 days,   2 each am x 2 days,  1 each am x 2 days and stop   rosuvastatin (CRESTOR) 10 MG tablet Take 1 tablet (10 mg total) by mouth daily.   sevelamer carbonate (RENVELA) 800 MG tablet Take 2,400 mg by mouth 3 (three) times daily.   sodium bicarbonate 650 MG tablet Take 650 mg by mouth 2 (two) times daily.             Past Medical History:  Diagnosis Date   Anemia    Anxiety    Cervical cancer (HCC)    Chronic bronchitis (HCC)    "get it q yr"   Chronic lower back pain    Depression    Febrile seizure (HCC)    "as a child"   Fibromyalgia    GERD (gastroesophageal reflux disease)    Gout    Hemodialysis patient (HCC)    History of blood transfusion    "S/P tonsillectomy"   History of hiatal hernia    Hx of cardiovascular stress test 05/14/2014   false positive Myoview    Hyperlipidemia    Hypertension    Migraine    "monthly" (05/26/2014)   Neuropathy    Osteoporosis    Parathyroid disease (HCC)    "my PHT levels run high; I can't take calcium"   Pneumonia "several times"   Renal cancer (HCC)    "  right"-s/p nephrectomy   Renal insufficiency    "left kidney works at 40-60%" (05/26/2014)   Sleep apnea    "wore mask for 2 months; could not take it" (05/26/2014)   Type II diabetes mellitus (HCC)    Typhus fever    "as a child"      Objective:    Wts  04/24/2023       166   04/17/23 170 lb 12.8 oz (77.5 kg)  03/27/23 166 lb (75.3 kg)  03/01/23 163 lb 2.3 oz (74 kg)     Vital signs reviewed  04/24/2023  - Note at rest 02 sats  93% on RA   General appearance:    amb somber female nad      HEENT : Oropharynx  clear      Nasal turbinates nl    NECK :  without  apparent JVD/ palpable Nodes/TM    LUNGS: no acc muscle use,  Nl contour chest which is clear to A and P bilaterally without cough on insp or exp maneuvers   CV:  RRR  no s3 or murmur or increase in P2, and no edema   ABD:  soft and nontender   MS:  Gait nl   ext warm without deformities Or obvious joint restrictions  calf tenderness, cyanosis or clubbing    SKIN: warm and dry without lesions    NEURO:  alert, approp, nl sensorium with  no motor or cerebellar deficits apparent.    I personally reviewed images and agree with radiology impression as follows:   Chest CTa  03/01/23    Neg PE/ no effusion or as dz   Visualized thyroid gland appears grossly unremarkable. No solid / cystic mediastinal masses. The esophagus is nondistended precluding optimal assessment. There is mild circumferential thickening of the lower thoracic esophagus, which is most likely seen in the settings of chronic gastroesophageal reflux disease versus esophagitis. No axillary, mediastinal or hilarlymphadenopathy by size criteria.       Assessment

## 2023-04-24 ENCOUNTER — Ambulatory Visit: Payer: HMO | Admitting: Internal Medicine

## 2023-04-24 ENCOUNTER — Encounter: Payer: Self-pay | Admitting: Internal Medicine

## 2023-04-24 VITALS — BP 149/79 | HR 58 | Ht 62.0 in | Wt 166.4 lb

## 2023-04-24 DIAGNOSIS — R0609 Other forms of dyspnea: Secondary | ICD-10-CM

## 2023-04-24 NOTE — Patient Instructions (Signed)
No change in recommendations   Pulmonary follow up is as needed

## 2023-04-24 NOTE — Assessment & Plan Note (Signed)
 Abrupt onset 03/01/23 while supine  assoc with acute on chronic dysphagia with esophagitis on CTa chest 03/01/23  -  Echo ok 12/12/22 x for mild MR/ nl RA pressures  with evidence of congestive hepatopathy on Bx  11/28/22  -  ESR 03/27/2023 = 26   More than likely her cp is related to esophagitis and not pericarditis with GI and cards f/u planned   No further pulmonary eval needed  - f/u prn          Each maintenance medication was reviewed in detail including emphasizing most importantly the difference between maintenance and prns and under what circumstances the prns are to be triggered using an action plan format where appropriate.  Total time for H and P, chart review, counseling,  and generating customized AVS unique to this office visit / same day charting = 26 min final summary f/u ov

## 2023-04-25 ENCOUNTER — Ambulatory Visit (INDEPENDENT_AMBULATORY_CARE_PROVIDER_SITE_OTHER): Payer: Medicare Other | Admitting: Gastroenterology

## 2023-05-03 ENCOUNTER — Ambulatory Visit (HOSPITAL_COMMUNITY)
Admission: RE | Admit: 2023-05-03 | Discharge: 2023-05-03 | Disposition: A | Discharge status: Home / Self Care | Source: Ambulatory Visit | Attending: Student | Admitting: Student

## 2023-05-03 DIAGNOSIS — E119 Type 2 diabetes mellitus without complications: Secondary | ICD-10-CM | POA: Diagnosis not present

## 2023-05-03 DIAGNOSIS — I119 Hypertensive heart disease without heart failure: Secondary | ICD-10-CM | POA: Insufficient documentation

## 2023-05-03 DIAGNOSIS — R0781 Pleurodynia: Secondary | ICD-10-CM | POA: Insufficient documentation

## 2023-05-03 DIAGNOSIS — R0789 Other chest pain: Secondary | ICD-10-CM

## 2023-05-03 DIAGNOSIS — G4730 Sleep apnea, unspecified: Secondary | ICD-10-CM | POA: Insufficient documentation

## 2023-05-03 DIAGNOSIS — E785 Hyperlipidemia, unspecified: Secondary | ICD-10-CM | POA: Diagnosis not present

## 2023-05-03 LAB — ECHOCARDIOGRAM LIMITED: S' Lateral: 2.1 cm

## 2023-05-03 NOTE — Progress Notes (Signed)
*  PRELIMINARY RESULTS* Echocardiogram Limited 2-D Echocardiogram  has been performed.  Margaret Huerta 05/03/2023, 2:49 PM

## 2023-05-09 DIAGNOSIS — Z992 Dependence on renal dialysis: Secondary | ICD-10-CM | POA: Diagnosis not present

## 2023-05-09 DIAGNOSIS — N281 Cyst of kidney, acquired: Secondary | ICD-10-CM | POA: Diagnosis not present

## 2023-05-09 DIAGNOSIS — N186 End stage renal disease: Secondary | ICD-10-CM | POA: Diagnosis not present

## 2023-05-14 DIAGNOSIS — Z992 Dependence on renal dialysis: Secondary | ICD-10-CM | POA: Diagnosis not present

## 2023-05-14 DIAGNOSIS — N2581 Secondary hyperparathyroidism of renal origin: Secondary | ICD-10-CM | POA: Diagnosis not present

## 2023-05-14 DIAGNOSIS — D631 Anemia in chronic kidney disease: Secondary | ICD-10-CM | POA: Diagnosis not present

## 2023-05-14 DIAGNOSIS — E8770 Fluid overload, unspecified: Secondary | ICD-10-CM | POA: Diagnosis not present

## 2023-05-14 DIAGNOSIS — E1140 Type 2 diabetes mellitus with diabetic neuropathy, unspecified: Secondary | ICD-10-CM | POA: Diagnosis not present

## 2023-05-14 DIAGNOSIS — E119 Type 2 diabetes mellitus without complications: Secondary | ICD-10-CM | POA: Diagnosis not present

## 2023-05-14 DIAGNOSIS — N281 Cyst of kidney, acquired: Secondary | ICD-10-CM | POA: Diagnosis not present

## 2023-05-14 DIAGNOSIS — D649 Anemia, unspecified: Secondary | ICD-10-CM | POA: Diagnosis not present

## 2023-05-14 DIAGNOSIS — N186 End stage renal disease: Secondary | ICD-10-CM | POA: Diagnosis not present

## 2023-05-30 ENCOUNTER — Telehealth: Payer: Self-pay | Admitting: Gastroenterology

## 2023-05-30 NOTE — Telephone Encounter (Signed)
Request received to transfer GI care from outside practice to  GI.  We appreciate the interest in our practice, however due to high demand from patients without established GI providers, we cannot accommodate this transfer.    - H. Myrtie Neither, MD

## 2023-05-30 NOTE — Telephone Encounter (Signed)
 Good morning Dr. Dominic Friendly,   Doc of Day AM 05/30/23   We received a call from this patient's referring provider wishing to schedule an appointment for this patient for chronic abdominal pain and nausea. Patient was last seen by [facility] in 12/2022. She states she is not happy with the care she received there and would like a second opinion.  Records from previous GI history are in Epic for you to review. Would you please advise on scheduling?  Thank you.

## 2023-06-13 DIAGNOSIS — E119 Type 2 diabetes mellitus without complications: Secondary | ICD-10-CM | POA: Diagnosis not present

## 2023-06-13 DIAGNOSIS — N186 End stage renal disease: Secondary | ICD-10-CM | POA: Diagnosis not present

## 2023-06-13 DIAGNOSIS — N281 Cyst of kidney, acquired: Secondary | ICD-10-CM | POA: Diagnosis not present

## 2023-06-13 DIAGNOSIS — N2581 Secondary hyperparathyroidism of renal origin: Secondary | ICD-10-CM | POA: Diagnosis not present

## 2023-06-13 DIAGNOSIS — Z992 Dependence on renal dialysis: Secondary | ICD-10-CM | POA: Diagnosis not present

## 2023-06-13 DIAGNOSIS — D649 Anemia, unspecified: Secondary | ICD-10-CM | POA: Diagnosis not present

## 2023-06-13 DIAGNOSIS — E8770 Fluid overload, unspecified: Secondary | ICD-10-CM | POA: Diagnosis not present

## 2023-06-13 DIAGNOSIS — D631 Anemia in chronic kidney disease: Secondary | ICD-10-CM | POA: Diagnosis not present

## 2023-06-13 DIAGNOSIS — E1140 Type 2 diabetes mellitus with diabetic neuropathy, unspecified: Secondary | ICD-10-CM | POA: Diagnosis not present

## 2023-06-13 DIAGNOSIS — Z23 Encounter for immunization: Secondary | ICD-10-CM | POA: Diagnosis not present

## 2023-06-17 DIAGNOSIS — E65 Localized adiposity: Secondary | ICD-10-CM | POA: Diagnosis not present

## 2023-06-18 DIAGNOSIS — Z79899 Other long term (current) drug therapy: Secondary | ICD-10-CM | POA: Diagnosis not present

## 2023-06-18 DIAGNOSIS — I953 Hypotension of hemodialysis: Secondary | ICD-10-CM | POA: Diagnosis not present

## 2023-06-18 DIAGNOSIS — F419 Anxiety disorder, unspecified: Secondary | ICD-10-CM | POA: Diagnosis not present

## 2023-06-18 DIAGNOSIS — E1129 Type 2 diabetes mellitus with other diabetic kidney complication: Secondary | ICD-10-CM | POA: Diagnosis not present

## 2023-06-18 DIAGNOSIS — N186 End stage renal disease: Secondary | ICD-10-CM | POA: Diagnosis not present

## 2023-06-20 ENCOUNTER — Ambulatory Visit: Admitting: Cardiology

## 2023-06-20 NOTE — Telephone Encounter (Signed)
 Called patient to advised on previous recommendations but patient was not available.

## 2023-06-25 DIAGNOSIS — E1122 Type 2 diabetes mellitus with diabetic chronic kidney disease: Secondary | ICD-10-CM | POA: Diagnosis not present

## 2023-06-25 DIAGNOSIS — R945 Abnormal results of liver function studies: Secondary | ICD-10-CM | POA: Diagnosis not present

## 2023-06-25 DIAGNOSIS — N186 End stage renal disease: Secondary | ICD-10-CM | POA: Diagnosis not present

## 2023-07-14 DIAGNOSIS — Z992 Dependence on renal dialysis: Secondary | ICD-10-CM | POA: Diagnosis not present

## 2023-07-14 DIAGNOSIS — N186 End stage renal disease: Secondary | ICD-10-CM | POA: Diagnosis not present

## 2023-07-14 DIAGNOSIS — N281 Cyst of kidney, acquired: Secondary | ICD-10-CM | POA: Diagnosis not present

## 2023-07-15 DIAGNOSIS — D631 Anemia in chronic kidney disease: Secondary | ICD-10-CM | POA: Diagnosis not present

## 2023-07-15 DIAGNOSIS — D649 Anemia, unspecified: Secondary | ICD-10-CM | POA: Diagnosis not present

## 2023-07-15 DIAGNOSIS — N186 End stage renal disease: Secondary | ICD-10-CM | POA: Diagnosis not present

## 2023-07-15 DIAGNOSIS — Z23 Encounter for immunization: Secondary | ICD-10-CM | POA: Diagnosis not present

## 2023-07-15 DIAGNOSIS — Z992 Dependence on renal dialysis: Secondary | ICD-10-CM | POA: Diagnosis not present

## 2023-07-15 DIAGNOSIS — N2581 Secondary hyperparathyroidism of renal origin: Secondary | ICD-10-CM | POA: Diagnosis not present

## 2023-07-15 DIAGNOSIS — E8770 Fluid overload, unspecified: Secondary | ICD-10-CM | POA: Diagnosis not present

## 2023-07-18 ENCOUNTER — Ambulatory Visit: Admitting: Student

## 2023-08-13 DIAGNOSIS — N2581 Secondary hyperparathyroidism of renal origin: Secondary | ICD-10-CM | POA: Diagnosis not present

## 2023-08-13 DIAGNOSIS — D649 Anemia, unspecified: Secondary | ICD-10-CM | POA: Diagnosis not present

## 2023-08-13 DIAGNOSIS — N186 End stage renal disease: Secondary | ICD-10-CM | POA: Diagnosis not present

## 2023-08-13 DIAGNOSIS — Z992 Dependence on renal dialysis: Secondary | ICD-10-CM | POA: Diagnosis not present

## 2023-08-13 DIAGNOSIS — D631 Anemia in chronic kidney disease: Secondary | ICD-10-CM | POA: Diagnosis not present

## 2023-08-13 DIAGNOSIS — E8770 Fluid overload, unspecified: Secondary | ICD-10-CM | POA: Diagnosis not present

## 2023-08-13 DIAGNOSIS — N281 Cyst of kidney, acquired: Secondary | ICD-10-CM | POA: Diagnosis not present

## 2023-09-10 ENCOUNTER — Ambulatory Visit: Payer: Self-pay | Attending: Cardiology | Admitting: Cardiology

## 2023-09-10 ENCOUNTER — Ambulatory Visit: Admitting: Cardiology

## 2023-09-10 ENCOUNTER — Encounter: Payer: Self-pay | Admitting: Cardiology

## 2023-09-10 VITALS — BP 136/82 | HR 64 | Ht 62.0 in | Wt 164.2 lb

## 2023-09-10 DIAGNOSIS — I10 Essential (primary) hypertension: Secondary | ICD-10-CM

## 2023-09-10 DIAGNOSIS — R0789 Other chest pain: Secondary | ICD-10-CM

## 2023-09-10 DIAGNOSIS — E782 Mixed hyperlipidemia: Secondary | ICD-10-CM

## 2023-09-10 NOTE — Progress Notes (Signed)
 Clinical Summary Ms. Nie is a 78 y.o.female seen today for follow up of the following medical problems.      1. History of chest pain Patient had left heart cath in 2003 (a J.  [facility]an[facility]) and in 2016.  For abnormal stress test.  These both showed normal coronary arteries   07/2019 mild ischemia, low risk - previously has responved to [facility] NG, symptoms improved on isordil    04/2023 echo: LVEF 60-65%, no effusion - chronic chest pains at times, overall unchanged.   2. HTN Compliant with meds - has had chronic issues with orthostatic hypotension, appear somewhat worsened since starting dialysis - low bp's at times, some orthostatic symptoms.    - low bp's during HD, nephrology had recommended stopping hydralazine . Ongoing orthostatic symptoms, had orthostatic fall after HD.    -last visit we stopped hydralazine , then lowered atenolol  25mg  bid - ongoing orthostatic dizziness. BP's still low with HD - nephrology had raised her dry weight previously, lowered amount of fluid removal - atenolol  helps with migraines.  - orthostatics abnormal in HD, drops 20-30s.   - home bp's during HD 137/79 before HD. On HD on average 105/76. She does take her atenolol  mornings before dialysis. -      3. Hyperlipidemia - labs followed by pcp - she is on crestor  08/2021 TC 127 TG 121 HDL 53 LDL 53           4. ESRD - Dr Rachele follows - left sided fistula developed steal syndrome. - doing home peritoneal HD. Low bp's at times to 80s to 90s with standing       4. DM2   5. OSA  - not comfortable with cpap, not using.    6. Orthostatic syncope -  chronic orthostatic dizzienss at times - 04/2023 appt with PA Strader discussed recent episodes of syncope. Was orthostatic in clinic, SBP dropping 22 points with standing.  - was already on midodrine  on HD days, asked to start taking daily   - she reports she lowered the amount of fluid removed on HD, did not start    7. Mild MR - by echo  2021   8.Elevated liver enzymes - recent liver biopsy   SH: husband diagnosed colon cancer, due for surgery this month June 2023 Past Medical History:  Diagnosis Date   Anemia    Anxiety    Cervical cancer (HCC)    Chronic bronchitis (HCC)    get it q yr   Chronic lower back pain    Depression    Febrile seizure (HCC)    as a child   Fibromyalgia    GERD (gastroesophageal reflux disease)    Gout    Hemodialysis patient (HCC)    History of blood transfusion    S/P tonsillectomy   History of hiatal hernia    Hx of cardiovascular stress test 05/14/2014   false positive Myoview    Hyperlipidemia    Hypertension    Migraine    monthly (05/26/2014)   Neuropathy    Osteoporosis    Parathyroid disease (HCC)    my PHT levels run high; I can't take calcium    Pneumonia several times   Renal cancer (HCC)    right-s/p nephrectomy   Renal insufficiency    left kidney works at 40-60% (05/26/2014)   Sleep apnea    wore mask for 2 months; could not take it (05/26/2014)   Type II diabetes mellitus (HCC)    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 Shaky, nausea   Fentanyl  Itching   Gluten Meal Diarrhea    GI-upset   Lactose Intolerance (Gi) Other (See Comments)    Gi upset   Morphine Nausea And Vomiting   Neurontin  [Gabapentin ] Nausea Only, Swelling and Other (See Comments)    Confusion. Felt like in a dream. foggy   Norvasc  [Amlodipine ] Swelling   Nsaids Other (See Comments)    Severe chronic kidney disease   Other  Other (See Comments)    Anesthesia--prolonged sleep inertia (grogginess/disorientation/cognitive impairment)   Dilaudid [Hydromorphone Hcl] Itching, Nausea And Vomiting and Rash     Current Outpatient Medications  Medication Sig Dispense Refill   acetaminophen  (TYLENOL ) 500 MG tablet Take 1,000 mg by mouth every 6 (six) hours as needed (for pain.).     allopurinol  (ZYLOPRIM ) 300 MG tablet Take 300 mg by mouth in the morning.  12   ALPRAZolam (XANAX) 0.5 MG tablet Take by mouth.     aspirin  EC 81 MG tablet Take 81 mg by mouth in the morning. Swallow whole.     atenolol  (TENORMIN ) 50 MG tablet Take 0.5 tablets (25 mg total) by mouth 2 (two) times daily. 90 tablet 1   calcitRIOL (ROCALTROL) 0.25 MCG capsule Take 0.25 mcg by mouth in the morning.     Cholecalciferol  (VITAMIN D3) 250 MCG (10000 UT) TABS Take 10,000 Units by mouth in the morning.     cinacalcet  (SENSIPAR ) 30 MG tablet Take 30 mg by mouth See admin instructions. Take 1 tablet by mouth on Monday,Wednesday and Friday     DULoxetine  (CYMBALTA ) 60 MG capsule Take 60 mg by mouth in the morning.     famotidine  (PEPCID ) 20 MG tablet One after supper 30 tablet 11   gabapentin  (NEURONTIN ) 100 MG capsule Take 100 mg by mouth daily as needed.     hyoscyamine  (LEVSIN ) 0.125 MG tablet Take 1 tablet (0.125 mg total) by mouth every 8 (eight) hours as needed (abdominal pain). 60 tablet 1   isosorbide  dinitrate (ISORDIL ) 10 MG tablet Take 1 tablet by mouth twice daily 180 tablet 3   Methoxy PEG-Epoetin  Beta (MIRCERA IJ) Inject into the skin. As needed     midodrine  (PROAMATINE ) 5 MG tablet Take 1 tablet (5 mg total) by mouth See admin instructions. Take 1 tablet 30 minutes prior to dialysis and then take 1 tablet half way through dialysis. Add Midodrine  5 mg Two Times Daily on Non-dialysis days 180 tablet 3   NITROSTAT  0.4 MG SL tablet Place 1 tablet (0.4 mg total) under the tongue every 5 (five) minutes x 3 doses as needed. 25 tablet 3    pantoprazole  (PROTONIX ) 40 MG tablet Take 1 tablet (40 mg total) by mouth daily. Take 30-60 min before first meal of the day 30 tablet 2   predniSONE  (DELTASONE ) 10 MG tablet Take  4 each am x 2 days,   2 each am x 2 days,  1 each am x  2 days and stop 14 tablet 0   rosuvastatin  (CRESTOR ) 10 MG tablet Take 1 tablet (10 mg total) by mouth daily.     sevelamer carbonate (RENVELA) 800 MG tablet Take 2,400 mg by mouth 3 (three) times daily.     sodium bicarbonate 650 MG tablet Take 650 mg by mouth 2 (two) times daily.     No current facility-administered medications for this visit.     Past Surgical History:  Procedure Laterality Date   ABDOMINAL HERNIA REPAIR  02/2013   in [place]   ABDOMINAL HYSTERECTOMY  1977   partial   APPENDECTOMY     AV FISTULA PLACEMENT Left 06/13/2021   Procedure: LEFT ARM ARTERIOVENOUS FISTULA CREATION;  Surgeon: Oris Krystal FALCON, MD;  Location: [facility];  Service: Vascular;  Laterality: Left;   BACK SURGERY     BILATERAL SALPINGOOPHORECTOMY Bilateral ~ 1993   BIOPSY  10/16/2022   Procedure: BIOPSY;  Surgeon: Eartha Angelia Sieving, MD;  Location: [facility];  Service: Gastroenterology;;   CARDIAC CATHETERIZATION  2003, April 2016   normal coronaries   CATARACT EXTRACTION W/ INTRAOCULAR LENS  IMPLANT, BILATERAL  2015   COLONOSCOPY N/A 11/25/2014   Procedure: COLONOSCOPY;  Surgeon: Claudis RAYMOND Rivet, MD;  Location: [facility];  Service: Endoscopy;  Laterality: N/A;   COLONOSCOPY WITH PROPOFOL  N/A 12/12/2021   Procedure: COLONOSCOPY WITH PROPOFOL ;  Surgeon: Eartha Angelia Sieving, MD;  Location: [facility];  Service: Gastroenterology;  Laterality: N/A;  130 ASA 3 patient has dialysis Mon Wed & Fri, per Anette Caldron pt knows new arrival time   CYSTOSCOPY W/ [place] MANIPULATION  1988   DIAGNOSTIC LAPAROSCOPY  1975   DILATION AND CURETTAGE OF UTERUS     ESOPHAGOGASTRODUODENOSCOPY N/A 11/25/2014   Procedure: ESOPHAGOGASTRODUODENOSCOPY (EGD);  Surgeon: Claudis RAYMOND Rivet, MD;  Location: [facility];  Service: Endoscopy;  Laterality: N/A;  9:30 - moved to 11:25 - [name] to notify pt   ESOPHAGOGASTRODUODENOSCOPY (EGD) WITH ESOPHAGEAL DILATION  1997   ESOPHAGOGASTRODUODENOSCOPY (EGD) WITH PROPOFOL  N/A 10/16/2022   Procedure: ESOPHAGOGASTRODUODENOSCOPY (EGD) WITH PROPOFOL ;  Surgeon: Eartha Angelia Sieving, MD;  Location: [facility];  Service: Gastroenterology;  Laterality: N/A;  9:30am;asa 3   EYE SURGERY     GASTRIC BYPASS  2012   HERNIA REPAIR     2015   HIATAL HERNIA REPAIR  1996   INCONTINENCE SURGERY  1983   INSERTION OF DIALYSIS CATHETER Right 09/19/2021   Procedure: INSERTION OF TUNNELED DIALYSIS CATHETER;  Surgeon: Oris Krystal FALCON, MD;  Location: [facility];  Service: Vascular;  Laterality: Right;   KNEE ARTHROSCOPY Left 2000's   LAPAROSCOPIC CHOLECYSTECTOMY  1990's   LEFT HEART CATHETERIZATION WITH CORONARY ANGIOGRAM N/A 05/27/2014   Procedure: LEFT HEART CATHETERIZATION WITH CORONARY ANGIOGRAM;  Surgeon: Dorn JINNY Lesches, MD;  Location: [facility] C[facility];  Service: Cardiovascular;  Laterality: N/A;   LIGATION OF ARTERIOVENOUS  FISTULA Left 09/19/2021   Procedure: LIGATION OF ARTERIOVENOUS  FISTULA;  Surgeon: Oris Krystal FALCON, MD;  Location: [facility];  Service: Vascular;  Laterality: Left;   LUMBAR DISC SURGERY  2001   herniated discs   MOUTH SURGERY  1991   drilled into gum and put tooth implants uppers   NEPHRECTOMY Right 2002   cancer   POLYPECTOMY  12/12/2021   Procedure: POLYPECTOMY;  Surgeon: Eartha Angelia Sieving, MD;  Location: [facility];  Service: Gastroenterology;;   TMJ ARTHROPLASTY  1988   TONSILLECTOMY  1976   UPPER GI ENDOSCOPY  Allergies  Allergen Reactions   Demerol [Meperidine] Other (See Comments)    Stop breathing   Lyrica [Pregabalin] Swelling    Stomach lesion   Vicodin [Hydrocodone-Acetaminophen ] Other (See Comments)    Stop breathing   Ace Inhibitors Cough   Aleve [Naproxen] Other (See Comments)    stomach  lesion   Codeine Other (See Comments)    hallucination, confused   Contrast Media [Iodinated Contrast Media] Nausea Only and Other (See Comments)    MRI dye. Shaky, nausea   Fentanyl  Itching   Gluten Meal Diarrhea    GI-upset   Lactose Intolerance (Gi) Other (See Comments)    Gi upset   Morphine Nausea And Vomiting   Neurontin  [Gabapentin ] Nausea Only, Swelling and Other (See Comments)    Confusion. Felt like in a dream. foggy   Norvasc  [Amlodipine ] Swelling   Nsaids Other (See Comments)    Severe chronic kidney disease   Other Other (See Comments)    Anesthesia--prolonged sleep inertia (grogginess/disorientation/cognitive impairment)   Dilaudid [Hydromorphone Hcl] Itching, Nausea And Vomiting and Rash      Family History  Problem Relation Age of Onset   Emphysema Father    Heart disease Father    Clotting disorder Mother    Diabetes Mother    Kidney disease Mother    Allergies Other        whole family per pt   Lung cancer Maternal Uncle    Alcohol abuse Maternal Uncle      Social History Ms. Aman reports that she has never smoked. She has never been exposed to tobacco smoke. She has never used smokeless tobacco. Ms. Manfredi reports no history of alcohol use.   Physical Examination Today's Vitals   09/10/23 1540  BP: 136/82  Pulse: 64  SpO2: 94%  Weight: 164 lb 3.2 oz (74.5 kg)  Height: 5' 2 (1.575 m)   Body mass index is 30.03 kg/m.  Gen: resting comfortably, no acute distress HEENT: no scleral icterus, pupils equal round and reactive, no palptable cervical adenopathy,  CV: RRR, no m/rg, no jvd Resp: Clear to auscultation bilaterally GI: abdomen is soft, non-tender, non-distended, normal bowel sounds, no hepatosplenomegaly MSK: extremities are warm, no edema.  Skin: warm, no rash Neuro:  no focal deficits Psych: appropriate affect   Diagnostic Studies  Nuclear stress test 08/05/2019 There was no ST segment deviation noted during stress. Findings  consistent with mild apical ischemia. This is a low risk study. The left ventricular ejection fraction is hyperdynamic (>65%).   Echo 08/05/19 . Left ventricular ejection fraction, by estimation, is 60 to 65%. The  left ventricle has normal function. The left ventricle has no regional  wall motion abnormalities. There is mild left ventricular hypertrophy.  Left ventricular diastolic parameters  are consistent with Grade I diastolic dysfunction (impaired relaxation).   2. Right ventricular systolic function is normal. The right ventricular  size is normal. There is mildly elevated pulmonary artery systolic  pressure.   3. Left atrial size was severely dilated.   4. Right atrial size was mildly dilated.   5. The mitral valve is normal in structure. Mild mitral valve  regurgitation. No evidence of mitral stenosis.   6. The aortic valve is tricuspid. Aortic valve regurgitation is not  visualized. No aortic stenosis is present.   7. The inferior vena cava is normal in size with greater than 50%  respiratory variability, suggesting right atrial pressure of 3 mmHg.   8. Left to right atrial shunting  noted by color Doppler, probable PFO.      11/2019 renal artery US  Summary:  Largest Aortic Diameter: 2.2 cm     Renal:     Right: Right kidney removed 2002.  Left:  Normal size of left kidney. Abnormal left Resisitve Index.         Normal cortical thickness of the left kidney. No evidence of         left renal artery stenosis. LRV flow present.  Mesenteric:  Normal Celiac artery and Superior Mesenteric artery findings.      Assessment and Plan  1. Chest pain - long history of symptoms with overall benign ischemic testing, most recently 07/2019 -has improved in general on nitrates.  - chronic stable symptoms, continue to monitor   2. HTN -ongoing issues with low bp's and orthostatic falls, particularly after HD -prior vitals have shown orthostatic changes - bp's overall doing well,  continue current regimen.    3. Hyperlipidemia - continue current meds, has been at goal.            Dorn PHEBE Ross, M.D.

## 2023-09-10 NOTE — Patient Instructions (Signed)
 Medication Instructions:  Continue all current medications.   Labwork: none  Testing/Procedures: none  Follow-Up: 6 months   Any Other Special Instructions Will Be Listed Below (If Applicable).   If you need a refill on your cardiac medications before your next appointment, please call your pharmacy.

## 2023-09-13 DIAGNOSIS — Z992 Dependence on renal dialysis: Secondary | ICD-10-CM | POA: Diagnosis not present

## 2023-09-13 DIAGNOSIS — E1140 Type 2 diabetes mellitus with diabetic neuropathy, unspecified: Secondary | ICD-10-CM | POA: Diagnosis not present

## 2023-09-13 DIAGNOSIS — N2581 Secondary hyperparathyroidism of renal origin: Secondary | ICD-10-CM | POA: Diagnosis not present

## 2023-09-13 DIAGNOSIS — D631 Anemia in chronic kidney disease: Secondary | ICD-10-CM | POA: Diagnosis not present

## 2023-09-13 DIAGNOSIS — Z23 Encounter for immunization: Secondary | ICD-10-CM | POA: Diagnosis not present

## 2023-09-13 DIAGNOSIS — E8770 Fluid overload, unspecified: Secondary | ICD-10-CM | POA: Diagnosis not present

## 2023-09-13 DIAGNOSIS — E119 Type 2 diabetes mellitus without complications: Secondary | ICD-10-CM | POA: Diagnosis not present

## 2023-09-13 DIAGNOSIS — N186 End stage renal disease: Secondary | ICD-10-CM | POA: Diagnosis not present

## 2023-09-13 DIAGNOSIS — N281 Cyst of kidney, acquired: Secondary | ICD-10-CM | POA: Diagnosis not present

## 2023-09-13 DIAGNOSIS — D689 Coagulation defect, unspecified: Secondary | ICD-10-CM | POA: Diagnosis not present

## 2023-09-13 DIAGNOSIS — D649 Anemia, unspecified: Secondary | ICD-10-CM | POA: Diagnosis not present

## 2023-10-02 DIAGNOSIS — E1129 Type 2 diabetes mellitus with other diabetic kidney complication: Secondary | ICD-10-CM | POA: Diagnosis not present

## 2023-10-09 DIAGNOSIS — E1122 Type 2 diabetes mellitus with diabetic chronic kidney disease: Secondary | ICD-10-CM | POA: Diagnosis not present

## 2023-10-09 DIAGNOSIS — Z79899 Other long term (current) drug therapy: Secondary | ICD-10-CM | POA: Diagnosis not present

## 2023-10-09 DIAGNOSIS — G4700 Insomnia, unspecified: Secondary | ICD-10-CM | POA: Diagnosis not present

## 2023-10-09 DIAGNOSIS — N186 End stage renal disease: Secondary | ICD-10-CM | POA: Diagnosis not present

## 2023-10-09 DIAGNOSIS — Z23 Encounter for immunization: Secondary | ICD-10-CM | POA: Diagnosis not present

## 2023-10-14 DIAGNOSIS — N2581 Secondary hyperparathyroidism of renal origin: Secondary | ICD-10-CM | POA: Diagnosis not present

## 2023-10-14 DIAGNOSIS — D631 Anemia in chronic kidney disease: Secondary | ICD-10-CM | POA: Diagnosis not present

## 2023-10-14 DIAGNOSIS — N186 End stage renal disease: Secondary | ICD-10-CM | POA: Diagnosis not present

## 2023-10-14 DIAGNOSIS — N281 Cyst of kidney, acquired: Secondary | ICD-10-CM | POA: Diagnosis not present

## 2023-10-14 DIAGNOSIS — Z992 Dependence on renal dialysis: Secondary | ICD-10-CM | POA: Diagnosis not present

## 2023-10-14 DIAGNOSIS — D649 Anemia, unspecified: Secondary | ICD-10-CM | POA: Diagnosis not present

## 2023-10-14 DIAGNOSIS — E8770 Fluid overload, unspecified: Secondary | ICD-10-CM | POA: Diagnosis not present

## 2023-11-13 DIAGNOSIS — N281 Cyst of kidney, acquired: Secondary | ICD-10-CM | POA: Diagnosis not present

## 2023-11-13 DIAGNOSIS — Z992 Dependence on renal dialysis: Secondary | ICD-10-CM | POA: Diagnosis not present

## 2023-11-13 DIAGNOSIS — N186 End stage renal disease: Secondary | ICD-10-CM | POA: Diagnosis not present

## 2023-11-16 DIAGNOSIS — N2581 Secondary hyperparathyroidism of renal origin: Secondary | ICD-10-CM | POA: Diagnosis not present

## 2023-11-16 DIAGNOSIS — E8770 Fluid overload, unspecified: Secondary | ICD-10-CM | POA: Diagnosis not present

## 2023-11-16 DIAGNOSIS — N186 End stage renal disease: Secondary | ICD-10-CM | POA: Diagnosis not present

## 2023-11-16 DIAGNOSIS — D649 Anemia, unspecified: Secondary | ICD-10-CM | POA: Diagnosis not present

## 2023-11-16 DIAGNOSIS — D631 Anemia in chronic kidney disease: Secondary | ICD-10-CM | POA: Diagnosis not present

## 2023-11-16 DIAGNOSIS — Z992 Dependence on renal dialysis: Secondary | ICD-10-CM | POA: Diagnosis not present

## 2023-11-27 ENCOUNTER — Encounter (INDEPENDENT_AMBULATORY_CARE_PROVIDER_SITE_OTHER): Payer: Self-pay | Admitting: Gastroenterology

## 2023-11-29 DIAGNOSIS — M25552 Pain in left hip: Secondary | ICD-10-CM | POA: Diagnosis not present

## 2023-11-29 DIAGNOSIS — M7062 Trochanteric bursitis, left hip: Secondary | ICD-10-CM | POA: Diagnosis not present

## 2023-12-05 DIAGNOSIS — Z23 Encounter for immunization: Secondary | ICD-10-CM | POA: Diagnosis not present

## 2023-12-09 ENCOUNTER — Other Ambulatory Visit (HOSPITAL_COMMUNITY): Payer: Self-pay | Admitting: Internal Medicine

## 2023-12-09 DIAGNOSIS — Z1231 Encounter for screening mammogram for malignant neoplasm of breast: Secondary | ICD-10-CM

## 2023-12-10 ENCOUNTER — Other Ambulatory Visit: Payer: Self-pay | Admitting: Medical Genetics

## 2023-12-10 DIAGNOSIS — Z006 Encounter for examination for normal comparison and control in clinical research program: Secondary | ICD-10-CM

## 2023-12-13 ENCOUNTER — Encounter (HOSPITAL_COMMUNITY)

## 2023-12-13 ENCOUNTER — Encounter (HOSPITAL_COMMUNITY): Payer: Self-pay

## 2023-12-13 DIAGNOSIS — Z1231 Encounter for screening mammogram for malignant neoplasm of breast: Secondary | ICD-10-CM

## 2023-12-14 DIAGNOSIS — N186 End stage renal disease: Secondary | ICD-10-CM | POA: Diagnosis not present

## 2023-12-14 DIAGNOSIS — Z992 Dependence on renal dialysis: Secondary | ICD-10-CM | POA: Diagnosis not present

## 2023-12-16 ENCOUNTER — Other Ambulatory Visit (HOSPITAL_COMMUNITY): Payer: Self-pay | Admitting: Internal Medicine

## 2023-12-16 DIAGNOSIS — E8770 Fluid overload, unspecified: Secondary | ICD-10-CM | POA: Diagnosis not present

## 2023-12-16 DIAGNOSIS — N2581 Secondary hyperparathyroidism of renal origin: Secondary | ICD-10-CM | POA: Diagnosis not present

## 2023-12-16 DIAGNOSIS — N63 Unspecified lump in unspecified breast: Secondary | ICD-10-CM

## 2023-12-16 DIAGNOSIS — Z992 Dependence on renal dialysis: Secondary | ICD-10-CM | POA: Diagnosis not present

## 2023-12-16 DIAGNOSIS — D689 Coagulation defect, unspecified: Secondary | ICD-10-CM | POA: Diagnosis not present

## 2023-12-16 DIAGNOSIS — E119 Type 2 diabetes mellitus without complications: Secondary | ICD-10-CM | POA: Diagnosis not present

## 2023-12-16 DIAGNOSIS — E1140 Type 2 diabetes mellitus with diabetic neuropathy, unspecified: Secondary | ICD-10-CM | POA: Diagnosis not present

## 2023-12-16 DIAGNOSIS — N186 End stage renal disease: Secondary | ICD-10-CM | POA: Diagnosis not present

## 2023-12-16 DIAGNOSIS — D631 Anemia in chronic kidney disease: Secondary | ICD-10-CM | POA: Diagnosis not present

## 2023-12-16 DIAGNOSIS — D649 Anemia, unspecified: Secondary | ICD-10-CM | POA: Diagnosis not present

## 2023-12-18 DIAGNOSIS — H26491 Other secondary cataract, right eye: Secondary | ICD-10-CM | POA: Diagnosis not present

## 2023-12-18 DIAGNOSIS — Z961 Presence of intraocular lens: Secondary | ICD-10-CM | POA: Diagnosis not present

## 2023-12-18 DIAGNOSIS — H43812 Vitreous degeneration, left eye: Secondary | ICD-10-CM | POA: Diagnosis not present

## 2023-12-18 DIAGNOSIS — H10413 Chronic giant papillary conjunctivitis, bilateral: Secondary | ICD-10-CM | POA: Diagnosis not present

## 2023-12-18 DIAGNOSIS — E113293 Type 2 diabetes mellitus with mild nonproliferative diabetic retinopathy without macular edema, bilateral: Secondary | ICD-10-CM | POA: Diagnosis not present

## 2023-12-24 ENCOUNTER — Ambulatory Visit (HOSPITAL_COMMUNITY)
Admission: RE | Admit: 2023-12-24 | Discharge: 2023-12-24 | Disposition: A | Discharge status: Home / Self Care | Source: Ambulatory Visit | Attending: Internal Medicine | Admitting: Internal Medicine

## 2023-12-24 ENCOUNTER — Encounter (HOSPITAL_COMMUNITY): Payer: Self-pay

## 2023-12-24 DIAGNOSIS — N63 Unspecified lump in unspecified breast: Secondary | ICD-10-CM | POA: Insufficient documentation

## 2023-12-24 DIAGNOSIS — R92323 Mammographic fibroglandular density, bilateral breasts: Secondary | ICD-10-CM | POA: Diagnosis not present

## 2023-12-24 DIAGNOSIS — N6325 Unspecified lump in the left breast, overlapping quadrants: Secondary | ICD-10-CM | POA: Insufficient documentation

## 2023-12-31 DIAGNOSIS — E1129 Type 2 diabetes mellitus with other diabetic kidney complication: Secondary | ICD-10-CM | POA: Diagnosis not present

## 2023-12-31 DIAGNOSIS — Z79899 Other long term (current) drug therapy: Secondary | ICD-10-CM | POA: Diagnosis not present

## 2023-12-31 DIAGNOSIS — M1000 Idiopathic gout, unspecified site: Secondary | ICD-10-CM | POA: Diagnosis not present

## 2023-12-31 DIAGNOSIS — N186 End stage renal disease: Secondary | ICD-10-CM | POA: Diagnosis not present

## 2023-12-31 DIAGNOSIS — I10 Essential (primary) hypertension: Secondary | ICD-10-CM | POA: Diagnosis not present

## 2024-01-07 DIAGNOSIS — E785 Hyperlipidemia, unspecified: Secondary | ICD-10-CM | POA: Diagnosis not present

## 2024-01-07 DIAGNOSIS — M109 Gout, unspecified: Secondary | ICD-10-CM | POA: Diagnosis not present

## 2024-01-07 DIAGNOSIS — D696 Thrombocytopenia, unspecified: Secondary | ICD-10-CM | POA: Diagnosis not present

## 2024-01-07 DIAGNOSIS — N186 End stage renal disease: Secondary | ICD-10-CM | POA: Diagnosis not present

## 2024-01-07 DIAGNOSIS — G4700 Insomnia, unspecified: Secondary | ICD-10-CM | POA: Diagnosis not present

## 2024-01-07 DIAGNOSIS — E1129 Type 2 diabetes mellitus with other diabetic kidney complication: Secondary | ICD-10-CM | POA: Diagnosis not present

## 2024-01-27 ENCOUNTER — Ambulatory Visit (HOSPITAL_COMMUNITY)

## 2024-02-17 NOTE — Progress Notes (Signed)
 [name] CONSULT NOTE  Patient Care Team: Sheryle Carwin, MD as PCP - General (Internal Medicine) Alvan Dorn FALCON, MD as PCP - Cardiology (Cardiology) Rachele Gaynell RAMAN, MD as Referring Physician (Nephrology) Darlean Ozell NOVAK, MD as Consulting Physician (Pulmonary Disease)  ASSESSMENT & PLAN 79 y.o.female with history of hypertension, ESRD on HD, DM2, diabetic neuropathy, OSA, gastric bypass surgery referred to Hematology for thrombocytopenia.  Discussed differential diagnosis today.  Records show mild borderline thrombocytopenia.  Previously US  suggests early hepatocellular disease such as hepatic steatosis and/or cirrhosis and abnormal liver biopsy with fibrosis and elevated LFT.  Previously with positive mitochondrial antibody.  Hepatitis B and C were not negative in 2024.  HH genetics was normal.  Will obtain additional blood testing, and follow-up.  On exam she has abdominal tenderness.  Report of persistent abdominal pain in the upper quadrants.  Will obtain ultrasound for evaluation.  Assessment & Plan Thrombocytopenia CBC, LFT, GGT, PT B12, copper , folate Mitochondrial antibody Upper abdominal pain LFT today. US  abdomen   Orders Placed This Encounter  Procedures   US  Abdomen Complete    Standing Status:   Future    Expected Date:   03/03/2024    Expiration Date:   02/17/2025    Reason for Exam (SYMPTOM  OR DIAGNOSIS REQUIRED):   persistent upper abdominal pain, elevated LFT r/o steatosis/lesion    Preferred imaging location?:   [facility]   CBC with Differential (Cancer Center Only)    Standing Status:   Future    Number of Occurrences:   1    Expiration Date:   02/17/2025   Gamma GT    Standing Status:   Future    Number of Occurrences:   1    Expiration Date:   02/17/2025   Hepatic function panel    Standing Status:   Future    Number of Occurrences:   1    Expiration Date:   02/17/2025   Vitamin B12    Standing Status:   Future    Number of  Occurrences:   1    Expiration Date:   02/17/2025   Folate    Standing Status:   Future    Number of Occurrences:   1    Expiration Date:   02/17/2025   Methylmalonic acid, serum    Standing Status:   Future    Number of Occurrences:   1    Expiration Date:   03/20/2024   Protime-INR    Standing Status:   Future    Number of Occurrences:   1    Expiration Date:   02/17/2025   Copper , serum    Standing Status:   Future    Number of Occurrences:   1    Expiration Date:   02/17/2025   Mitochondrial/smooth muscle ab pnl    Standing Status:   Future    Number of Occurrences:   1    Expiration Date:   02/17/2025   All questions were answered. The patient knows to call the clinic with any problems, questions or concerns. No barriers to learning was detected.  Margaret JAYSON Chihuahua, MD 02/18/2024 11:27 AM  CHIEF COMPLAINTS/PURPOSE OF CONSULTATION:  Thrombocytopenia  HISTORY OF PRESENTING ILLNESS:  Margaret Huerta 79 y.o. female is here because of thrombocytopenia.  Available outside records reviewed.  Records show mild thrombocytopenia from outside records 10/17/23 platelet 136 WBC 4.3 hemoglobin 11.8 ferritin 253 calcium  9.9 11/18/23 platelet 110 WBC 3.37 hemoglobin  11.7 12/26/23 platelet 112 WBC 4.4 hemoglobin 11.2  Patient reports she was found to have thrombocytopenia and referred here.  She has no prolonged bleeding episodes.  There is no bloody stool, melena, hematochezia or other bleeding.  She has no night sweats, weight loss, but does have decreased appetite with stomach pain.  She reports persistent upper quadrant abdominal pain over the past year.  She previously has seen GI for abnormal LFT.  She had imaging and biopsy.  She was told to continue follow-up however her gastroenterologist left arm.  She takes aspirin  daily.  Report of easy bruising. Denies other new medication.  Other relevant labs including negative hepatitis B and C.  Mildly elevated mitochondrial antibody.  She denies chronic  alcohol use.  Reported that her diet is imbalance.  She does not have good appetite.  MEDICAL HISTORY:  Past Medical History:  Diagnosis Date   Anemia    Anxiety    Cervical cancer (HCC)    Chronic bronchitis (HCC)    get it q yr   Chronic lower back pain    Depression    Febrile seizure (HCC)    as a child   Fibromyalgia    GERD (gastroesophageal reflux disease)    Gout    Hemodialysis patient    History of blood transfusion    S/P tonsillectomy   History of hiatal hernia    Hx of cardiovascular stress test 05/14/2014   false positive Myoview    Hyperlipidemia    Hypertension    Migraine    monthly (05/26/2014)   Neuropathy    Osteoporosis    Parathyroid disease    my PHT levels run high; I can't take calcium    Pneumonia several times   Renal cancer (HCC)    right-s/p nephrectomy   Renal insufficiency    left kidney works at 40-60% (05/26/2014)   Sleep apnea    wore mask for 2 months; could not take it (05/26/2014)   Type II diabetes mellitus (HCC)    Typhus fever    as a child    SURGICAL HISTORY: Past Surgical History:  Procedure Laterality Date   ABDOMINAL HERNIA REPAIR  02/2013   in [place]   ABDOMINAL HYSTERECTOMY  1977   partial   APPENDECTOMY     AV FISTULA PLACEMENT Left 06/13/2021   Procedure: LEFT ARM ARTERIOVENOUS FISTULA CREATION;  Surgeon: Oris Krystal FALCON, MD;  Location: [facility];  Service: Vascular;  Laterality: Left;   BACK SURGERY     BILATERAL SALPINGOOPHORECTOMY Bilateral ~ 1993   BIOPSY  10/16/2022   Procedure: BIOPSY;  Surgeon: Eartha Angelia Sieving, MD;  Location: [facility];  Service: Gastroenterology;;   CARDIAC CATHETERIZATION  2003, April 2016   normal coronaries   CATARACT EXTRACTION W/ INTRAOCULAR LENS  IMPLANT, BILATERAL  2015   COLONOSCOPY N/A 11/25/2014   Procedure: COLONOSCOPY;  Surgeon: Claudis RAYMOND Rivet, MD;  Location: [facility];  Service: Endoscopy;  Laterality: N/A;   COLONOSCOPY WITH PROPOFOL  N/A  12/12/2021   Procedure: COLONOSCOPY WITH PROPOFOL ;  Surgeon: Eartha Angelia Sieving, MD;  Location: [facility];  Service: Gastroenterology;  Laterality: N/A;  130 ASA 3 patient has dialysis Mon Wed & Fri, per Anette Caldron pt knows new arrival time   CYSTOSCOPY W/ Bottino MANIPULATION  1988   DIAGNOSTIC LAPAROSCOPY  1975   DILATION AND CURETTAGE OF UTERUS     ESOPHAGOGASTRODUODENOSCOPY N/A 11/25/2014   Procedure: ESOPHAGOGASTRODUODENOSCOPY (EGD);  Surgeon: Claudis RAYMOND Rivet, MD;  Location:  [facility];  Service: Endoscopy;  Laterality: N/A;  9:30 - moved to 11:25 - [name] to notify pt   ESOPHAGOGASTRODUODENOSCOPY (EGD) WITH ESOPHAGEAL DILATION  1997   ESOPHAGOGASTRODUODENOSCOPY (EGD) WITH PROPOFOL  N/A 10/16/2022   Procedure: ESOPHAGOGASTRODUODENOSCOPY (EGD) WITH PROPOFOL ;  Surgeon: Eartha Angelia Sieving, MD;  Location: [facility];  Service: Gastroenterology;  Laterality: N/A;  9:30am;asa 3   EYE SURGERY     GASTRIC BYPASS  2012   HERNIA REPAIR     2015   HIATAL HERNIA REPAIR  1996   INCONTINENCE SURGERY  1983   INSERTION OF DIALYSIS CATHETER Right 09/19/2021   Procedure: INSERTION OF TUNNELED DIALYSIS CATHETER;  Surgeon: Oris Krystal FALCON, MD;  Location: [facility];  Service: Vascular;  Laterality: Right;   KNEE ARTHROSCOPY Left 2000's   LAPAROSCOPIC CHOLECYSTECTOMY  1990's   LEFT HEART CATHETERIZATION WITH CORONARY ANGIOGRAM N/A 05/27/2014   Procedure: LEFT HEART CATHETERIZATION WITH CORONARY ANGIOGRAM;  Surgeon: Dorn JINNY Lesches, MD;  Location: [facility] CATH LAB;  Service: Cardiovascular;  Laterality: N/A;   LIGATION OF ARTERIOVENOUS  FISTULA Left 09/19/2021   Procedure: LIGATION OF ARTERIOVENOUS  FISTULA;  Surgeon: Oris Krystal FALCON, MD;  Location: [facility];  Service: Vascular;  Laterality: Left;   LUMBAR DISC SURGERY  2001   herniated discs   MOUTH SURGERY  1991   drilled into gum and put tooth implants uppers   NEPHRECTOMY Right 2002   cancer   POLYPECTOMY  12/12/2021   Procedure: POLYPECTOMY;   Surgeon: Eartha Angelia Sieving, MD;  Location: [facility];  Service: Gastroenterology;;   TMJ ARTHROPLASTY  1988   TONSILLECTOMY  1976   UPPER GI ENDOSCOPY      SOCIAL HISTORY: Social History   Socioeconomic History   Marital status: Married    Spouse name: Ronalee Scheunemann   Number of children: 1   Years of education: 12   Highest education level: 12th grade  Occupational History   Occupation: Housewife  Tobacco Use   Smoking status: Never    Passive exposure: Never   Smokeless tobacco: Never  Vaping Use   Vaping status: Never Used  Substance and Sexual Activity   Alcohol use: No    Alcohol/week: 0.0 standard drinks of alcohol   Drug use: No   Sexual activity: Yes    Partners: Male    Birth control/protection: Surgical  Other Topics Concern   Not on file  Social History Narrative   Not on file   Social Drivers of Health   Tobacco Use: Low Risk (09/10/2023)   Patient History    Smoking Tobacco Use: Never    Smokeless Tobacco Use: Never    Passive Exposure: Never  Financial Resource Strain: Low Risk (11/06/2021)   Received from [facility]   Overall Financial Resource Strain (CARDIA)    Difficulty of Paying Living Expenses: Not hard at all  Food Insecurity: No Food Insecurity (02/18/2024)   Epic    Worried About Programme Researcher, Broadcasting/film/video in the Last Year: Never true    Ran Out of Food in the Last Year: Never true  Transportation Needs: No Transportation Needs (02/18/2024)   Epic    Lack of Transportation (Medical): No    Lack of Transportation (Non-Medical): No  Physical Activity: Inactive (11/06/2021)   Received from [facility]   Exercise Vital Sign    On average, how many days per week do you engage in moderate to strenuous exercise (like a brisk walk)?: 0 days    On  average, how many minutes do you engage in exercise at this level?: 0 min  Stress: No Stress Concern Present (11/14/2021)   Received from [facility] -  Occupational Stress Questionnaire    Feeling of Stress : Only a little  Social Connections: Moderately Integrated (11/06/2021)   Received from [facility]   Social Network    How would you rate your social network (family, work, friends)?: Adequate participation with social networks  Intimate Partner Violence: Not At Risk (02/18/2024)   Epic    Fear of Current or Ex-Partner: No    Emotionally Abused: No    Physically Abused: No    Sexually Abused: No  Depression (PHQ2-9): Low Risk (02/18/2024)   Depression (PHQ2-9)    PHQ-2 Score: 0  Alcohol Screen: Low Risk (09/18/2021)   Alcohol Screen    Last Alcohol Screening Score (AUDIT): 1  Housing: Unknown (02/18/2024)   Epic    Unable to Pay for Housing in the Last Year: No    Number of Times Moved in the Last Year: Not on file    Homeless in the Last Year: No  Utilities: Not At Risk (02/18/2024)   Epic    Threatened with loss of utilities: No  Health Literacy: Not on file    FAMILY HISTORY: Family History  Problem Relation Age of Onset   Emphysema Father    Heart disease Father    Clotting disorder Mother    Diabetes Mother    Kidney disease Mother    Allergies Other        whole family per pt   Lung cancer Maternal Uncle    Alcohol abuse Maternal Uncle     ALLERGIES:  is allergic to demerol [meperidine], lyrica [pregabalin], vicodin [hydrocodone-acetaminophen ], ace inhibitors, aleve [naproxen], codeine, contrast media [iodinated contrast media], fentanyl , gluten meal, lactose intolerance (gi), morphine, neurontin  [gabapentin ], norvasc  [amlodipine ], nsaids, other, and dilaudid [hydromorphone hcl].  MEDICATIONS:  Current Outpatient Medications  Medication Sig Dispense Refill   acetaminophen  (TYLENOL ) 500 MG tablet Take 1,000 mg by mouth every 6 (six) hours as needed (for pain.).     allopurinol  (ZYLOPRIM ) 300 MG tablet Take 300 mg by mouth in the morning.  12   ALPRAZolam (XANAX) 0.5 MG tablet Take by mouth.     aspirin  EC 81 MG  tablet Take 81 mg by mouth in the morning. Swallow whole.     atenolol  (TENORMIN ) 50 MG tablet Take 0.5 tablets (25 mg total) by mouth 2 (two) times daily. 90 tablet 1   calcitRIOL (ROCALTROL) 0.25 MCG capsule Take 0.25 mcg by mouth in the morning.     Cholecalciferol  (VITAMIN D3) 250 MCG (10000 UT) TABS Take 10,000 Units by mouth in the morning.     docusate sodium  (COLACE) 50 MG capsule Take 50 mg by mouth daily as needed.     DULoxetine  (CYMBALTA ) 60 MG capsule Take 60 mg by mouth in the morning.     furosemide  (LASIX ) 40 MG tablet Take 40 mg by mouth 3 (three) times daily.     gabapentin  (NEURONTIN ) 100 MG capsule Take 100 mg by mouth daily as needed.     hyoscyamine  (LEVSIN ) 0.125 MG tablet Take 1 tablet (0.125 mg total) by mouth every 8 (eight) hours as needed (abdominal pain). 60 tablet 1   isosorbide  dinitrate (ISORDIL ) 10 MG tablet Take 1 tablet by mouth twice daily 180 tablet 3   midodrine  (PROAMATINE ) 5 MG tablet Take 1 tablet (5  mg total) by mouth See admin instructions. Take 1 tablet 30 minutes prior to dialysis and then take 1 tablet half way through dialysis. Add Midodrine  5 mg Two Times Daily on Non-dialysis days 180 tablet 3   NITROSTAT  0.4 MG SL tablet Place 1 tablet (0.4 mg total) under the tongue every 5 (five) minutes x 3 doses as needed. 25 tablet 3   predniSONE  (DELTASONE ) 10 MG tablet Take  4 each am x 2 days,   2 each am x 2 days,  1 each am x 2 days and stop 14 tablet 0   rosuvastatin  (CRESTOR ) 10 MG tablet Take 1 tablet (10 mg total) by mouth daily.     sevelamer carbonate (RENVELA) 800 MG tablet Take 2,400 mg by mouth 3 (three) times daily.     sodium bicarbonate 650 MG tablet Take 650 mg by mouth 2 (two) times daily.     No current facility-administered medications for this visit.    REVIEW OF SYSTEMS:   All relevant systems were reviewed with the patient and are negative.  PHYSICAL EXAMINATION: ECOG PERFORMANCE STATUS: 1  Vitals:   02/18/24 1031 02/18/24 1033   BP: (!) 140/73 (!) 163/72  Pulse: 60   Resp: 17   Temp: 98 F (36.7 C)   SpO2: 95%    Filed Weights   02/18/24 1031  Weight: 165 lb (74.8 kg)    GENERAL: alert, no distress and comfortable.  Walking slowly SKIN: skin color normal and no petechiae on exposed skin EYES:  sclera clear OROPHARYNX: Dry mucosa NECK: No palpable mass LYMPH:  no palpable cervical, axillary lymphadenopathy  LUNGS: clear to auscultation and percussion with normal breathing effort HEART: regular rate & rhythm and no murmurs  ABDOMEN: Upper abdominal tenderness.  Nondistended.   LABORATORY DATA:  I have reviewed the data relevant to her visit and ordered new labs.  RADIOGRAPHIC STUDIES: I have personally reviewed the radiological images as listed and agreed with the findings in the report. New imaging ordered.

## 2024-02-18 ENCOUNTER — Inpatient Hospital Stay

## 2024-02-18 VITALS — BP 163/72 | HR 60 | Temp 98.0°F | Resp 17 | Ht 62.0 in | Wt 165.0 lb

## 2024-02-18 DIAGNOSIS — Z992 Dependence on renal dialysis: Secondary | ICD-10-CM | POA: Insufficient documentation

## 2024-02-18 DIAGNOSIS — R1010 Upper abdominal pain, unspecified: Secondary | ICD-10-CM | POA: Insufficient documentation

## 2024-02-18 DIAGNOSIS — D696 Thrombocytopenia, unspecified: Secondary | ICD-10-CM | POA: Diagnosis not present

## 2024-02-18 DIAGNOSIS — E1140 Type 2 diabetes mellitus with diabetic neuropathy, unspecified: Secondary | ICD-10-CM | POA: Diagnosis not present

## 2024-02-18 DIAGNOSIS — Z79899 Other long term (current) drug therapy: Secondary | ICD-10-CM | POA: Insufficient documentation

## 2024-02-18 DIAGNOSIS — Z801 Family history of malignant neoplasm of trachea, bronchus and lung: Secondary | ICD-10-CM | POA: Diagnosis not present

## 2024-02-18 DIAGNOSIS — I120 Hypertensive chronic kidney disease with stage 5 chronic kidney disease or end stage renal disease: Secondary | ICD-10-CM | POA: Insufficient documentation

## 2024-02-18 DIAGNOSIS — E1122 Type 2 diabetes mellitus with diabetic chronic kidney disease: Secondary | ICD-10-CM | POA: Diagnosis not present

## 2024-02-18 DIAGNOSIS — G4733 Obstructive sleep apnea (adult) (pediatric): Secondary | ICD-10-CM | POA: Insufficient documentation

## 2024-02-18 DIAGNOSIS — N186 End stage renal disease: Secondary | ICD-10-CM | POA: Insufficient documentation

## 2024-02-18 DIAGNOSIS — Z7982 Long term (current) use of aspirin: Secondary | ICD-10-CM | POA: Insufficient documentation

## 2024-02-18 LAB — CBC WITH DIFFERENTIAL (CANCER CENTER ONLY)
Abs Immature Granulocytes: 0.01 K/uL (ref 0.00–0.07)
Basophils Absolute: 0 K/uL (ref 0.0–0.1)
Basophils Relative: 1 %
Eosinophils Absolute: 0.1 K/uL (ref 0.0–0.5)
Eosinophils Relative: 2 %
HCT: 35.9 % — ABNORMAL LOW (ref 36.0–46.0)
Hemoglobin: 11.6 g/dL — ABNORMAL LOW (ref 12.0–15.0)
Immature Granulocytes: 0 %
Lymphocytes Relative: 30 %
Lymphs Abs: 1.3 K/uL (ref 0.7–4.0)
MCH: 29.4 pg (ref 26.0–34.0)
MCHC: 32.3 g/dL (ref 30.0–36.0)
MCV: 91.1 fL (ref 80.0–100.0)
Monocytes Absolute: 0.3 K/uL (ref 0.1–1.0)
Monocytes Relative: 7 %
Neutro Abs: 2.5 K/uL (ref 1.7–7.7)
Neutrophils Relative %: 60 %
Platelet Count: 106 K/uL — ABNORMAL LOW (ref 150–400)
RBC: 3.94 MIL/uL (ref 3.87–5.11)
RDW: 15.7 % — ABNORMAL HIGH (ref 11.5–15.5)
Smear Review: NORMAL
WBC Count: 4.2 K/uL (ref 4.0–10.5)
nRBC: 0 % (ref 0.0–0.2)

## 2024-02-18 LAB — HEPATIC FUNCTION PANEL
ALT: 49 U/L — ABNORMAL HIGH (ref 0–44)
AST: 60 U/L — ABNORMAL HIGH (ref 15–41)
Albumin: 4.4 g/dL (ref 3.5–5.0)
Alkaline Phosphatase: 151 U/L — ABNORMAL HIGH (ref 38–126)
Bilirubin, Direct: 0.2 mg/dL (ref 0.0–0.2)
Indirect Bilirubin: 0.3 mg/dL (ref 0.3–0.9)
Total Bilirubin: 0.4 mg/dL (ref 0.0–1.2)
Total Protein: 7 g/dL (ref 6.5–8.1)

## 2024-02-18 LAB — PROTIME-INR
INR: 1 (ref 0.8–1.2)
Prothrombin Time: 13.9 s (ref 11.4–15.2)

## 2024-02-18 LAB — FOLATE: Folate: 11.5 ng/mL

## 2024-02-18 LAB — VITAMIN B12: Vitamin B-12: >4000 pg/mL — ABNORMAL HIGH (ref 180–914)

## 2024-02-18 NOTE — Assessment & Plan Note (Addendum)
 CBC, LFT, GGT, PT B12, copper , folate Mitochondrial antibody

## 2024-02-19 LAB — COPPER, SERUM: Copper: 69 ug/dL — ABNORMAL LOW (ref 80–158)

## 2024-02-19 LAB — GAMMA GT: GGT: 9 U/L (ref 7–50)

## 2024-02-20 LAB — MITOCHONDRIAL/SMOOTH MUSCLE AB PNL
F-Actin IgG: 7 U (ref 0–19)
Mitochondrial M2 Ab, IgG: <20 U (ref 0.0–20.0)

## 2024-02-22 LAB — METHYLMALONIC ACID, SERUM: Methylmalonic Acid, Quantitative: 917 nmol/L — ABNORMAL HIGH (ref 0–378)

## 2024-02-24 ENCOUNTER — Ambulatory Visit: Payer: Self-pay

## 2024-02-25 ENCOUNTER — Telehealth: Payer: Self-pay

## 2024-02-25 NOTE — Telephone Encounter (Signed)
 Scheduled patient for a phone visit this Thursday. Called and spoke with the patient, she is aware.

## 2024-02-26 NOTE — Progress Notes (Addendum)
 [facility] OFFICE PROGRESS NOTE  Patient Care Team: Sheryle Carwin, MD as PCP - General (Internal Medicine) Alvan Dorn FALCON, MD as PCP - Cardiology (Cardiology) Rachele Gaynell RAMAN, MD as Referring Physician (Nephrology) Darlean Ozell NOVAK, MD as Consulting Physician (Pulmonary Disease)  Visit type: Telephone call Physician location: [facility] Patient location: Home Total time for the encounter: 14 minutes.   79 y.o.female with history of hypertension, ESRD on HD, DM2, diabetic neuropathy, OSA, gastric bypass surgery referred to Hematology for thrombocytopenia.   Discussed results of persistent thrombocytopenia.  No nutritional deficiency clearly.  We will obtain bone marrow biopsy.  She also has a scheduled ultrasound to assess her liver.  Will follow-up after above testing.  She understands. Assessment & Plan Thrombocytopenia Ultrasound abdomen Bone marrow aspiration and biopsy Follow-up in 2/6 to go over results.  Orders Placed This Encounter  Procedures   CT BONE MARROW BIOPSY & ASPIRATION    Standing Status:   Future    Expected Date:   03/12/2024    Expiration Date:   02/26/2025    Reason for Exam (SYMPTOM  OR DIAGNOSIS REQUIRED):   persistent thrombocytopenia    Preferred imaging location?:   [facility]    Radiology Contrast Protocol - do NOT remove file path:   [email or web address]     [name], MD  INTERVAL HISTORY: Patient is being follow-up for recent results for thrombocytopenia.  Repeat testing showed hemoglobin 11.6 MCV 91.  Normal WBC.  Platelet 106.  B12 was elevated.  Borderline decreased copper  level.  Normal folate.  LFT was mildly elevated.  Bilirubin was normal.  Alkaline phosphatase was borderline elevated.  INR was normal.  Mitochondrial antibody was negative.  She has no night sweats, unexpected weight loss,   Relevant data reviewed during this visit included labs.  New labs  ordered.

## 2024-02-27 ENCOUNTER — Inpatient Hospital Stay (HOSPITAL_BASED_OUTPATIENT_CLINIC_OR_DEPARTMENT_OTHER)

## 2024-02-27 DIAGNOSIS — D696 Thrombocytopenia, unspecified: Secondary | ICD-10-CM | POA: Diagnosis not present

## 2024-02-27 NOTE — Assessment & Plan Note (Signed)
 Ultrasound abdomen Bone marrow aspiration and biopsy Follow-up in 2/6 to go over results.

## 2024-03-04 ENCOUNTER — Ambulatory Visit (HOSPITAL_COMMUNITY)

## 2024-03-04 ENCOUNTER — Encounter (HOSPITAL_COMMUNITY): Payer: Self-pay

## 2024-03-06 ENCOUNTER — Ambulatory Visit (HOSPITAL_COMMUNITY): Admission: RE | Admit: 2024-03-06 | Discharge: 2024-03-06 | Disposition: A | Source: Ambulatory Visit

## 2024-03-06 DIAGNOSIS — R1010 Upper abdominal pain, unspecified: Secondary | ICD-10-CM | POA: Insufficient documentation

## 2024-03-06 DIAGNOSIS — D696 Thrombocytopenia, unspecified: Secondary | ICD-10-CM | POA: Diagnosis present

## 2024-03-13 ENCOUNTER — Inpatient Hospital Stay

## 2024-03-16 ENCOUNTER — Other Ambulatory Visit: Payer: Self-pay | Admitting: Radiology

## 2024-03-16 DIAGNOSIS — D696 Thrombocytopenia, unspecified: Secondary | ICD-10-CM

## 2024-03-17 ENCOUNTER — Encounter (HOSPITAL_COMMUNITY): Payer: Self-pay

## 2024-03-17 ENCOUNTER — Ambulatory Visit (HOSPITAL_COMMUNITY): Admission: RE | Admit: 2024-03-17 | Discharge: 2024-03-17 | Disposition: A | Source: Ambulatory Visit

## 2024-03-17 ENCOUNTER — Other Ambulatory Visit: Payer: Self-pay

## 2024-03-17 ENCOUNTER — Ambulatory Visit: Payer: Self-pay

## 2024-03-17 DIAGNOSIS — D696 Thrombocytopenia, unspecified: Secondary | ICD-10-CM

## 2024-03-17 LAB — CBC WITH DIFFERENTIAL/PLATELET
Abs Immature Granulocytes: 0 10*3/uL (ref 0.00–0.07)
Basophils Absolute: 0 10*3/uL (ref 0.0–0.1)
Basophils Relative: 1 %
Eosinophils Absolute: 0.1 10*3/uL (ref 0.0–0.5)
Eosinophils Relative: 2 %
HCT: 36 % (ref 36.0–46.0)
Hemoglobin: 11 g/dL — ABNORMAL LOW (ref 12.0–15.0)
Immature Granulocytes: 0 %
Lymphocytes Relative: 29 %
Lymphs Abs: 1.2 10*3/uL (ref 0.7–4.0)
MCH: 29.2 pg (ref 26.0–34.0)
MCHC: 30.6 g/dL (ref 30.0–36.0)
MCV: 95.5 fL (ref 80.0–100.0)
Monocytes Absolute: 0.4 10*3/uL (ref 0.1–1.0)
Monocytes Relative: 9 %
Neutro Abs: 2.3 10*3/uL (ref 1.7–7.7)
Neutrophils Relative %: 59 %
Platelets: 101 10*3/uL — ABNORMAL LOW (ref 150–400)
RBC: 3.77 MIL/uL — ABNORMAL LOW (ref 3.87–5.11)
RDW: 15.3 % (ref 11.5–15.5)
WBC: 4 10*3/uL (ref 4.0–10.5)
nRBC: 0 % (ref 0.0–0.2)

## 2024-03-17 LAB — GLUCOSE, CAPILLARY: Glucose-Capillary: 142 mg/dL — ABNORMAL HIGH (ref 70–99)

## 2024-03-17 MED ORDER — MIDAZOLAM HCL 2 MG/2ML IJ SOLN
INTRAMUSCULAR | Status: AC
  Filled 2024-03-17: qty 2

## 2024-03-17 MED ORDER — MIDAZOLAM HCL (PF) 2 MG/2ML IJ SOLN
INTRAMUSCULAR | Status: AC | PRN
  Administered 2024-03-17 (×2): .5 mg via INTRAVENOUS

## 2024-03-17 MED ORDER — SODIUM CHLORIDE 0.9 % IV SOLN: INTRAVENOUS | Status: DC

## 2024-03-17 NOTE — Discharge Instructions (Addendum)
Discharge Instructions:   Please call Interventional Radiology clinic [phone] with any questions or concerns.  You may remove your dressing and shower tomorrow.    Bone Marrow Aspiration and Bone Marrow Biopsy, Adult, Care After This sheet gives you information about how to care for yourself after your procedure. Your health care provider may also give you more specific instructions. If you have problems or questions, contact your health care provider. What can I expect after the procedure? After the procedure, it is common to have: Mild pain and tenderness. Swelling. Bruising. Follow these instructions at home: Puncture site care  Follow instructions from your health care provider about how to take care of the puncture site. Make sure you: Wash your hands with soap and water before and after you change your bandage (dressing). If soap and water are not available, use hand sanitizer. Change your dressing as told by your health care provider. Check your puncture site every day for signs of infection. Check for: More redness, swelling, or pain. Fluid or blood. Warmth. Pus or a bad smell. Activity Return to your normal activities as told by your health care provider. Ask your health care provider what activities are safe for you. Do not lift anything that is heavier than 10 lb (4.5 kg), or the limit that you are told, until your health care provider says that it is safe. Do not drive for 24 hours if you were given a sedative during your procedure. General instructions  Take over-the-counter and prescription medicines only as told by your health care provider. Do not take baths, swim, or use a hot tub until your health care provider approves. Ask your health care provider if you may take showers. You may only be allowed to take sponge baths. If directed, put ice on the affected area. To do this: Put ice in a plastic bag. Place a towel between your skin and the bag. Leave the ice  on for 20 minutes, 2-3 times a day. Keep all follow-up visits as told by your health care provider. This is important. Contact a health care provider if: Your pain is not controlled with medicine. You have a fever. You have more redness, swelling, or pain around the puncture site. You have fluid or blood coming from the puncture site. Your puncture site feels warm to the touch. You have pus or a bad smell coming from the puncture site. Summary After the procedure, it is common to have mild pain, tenderness, swelling, and bruising. Follow instructions from your health care provider about how to take care of the puncture site and what activities are safe for you. Take over-the-counter and prescription medicines only as told by your health care provider. Contact a health care provider if you have any signs of infection, such as fluid or blood coming from the puncture site. This information is not intended to replace advice given to you by your health care provider. Make sure you discuss any questions you have with your health care provider. Document Revised: 06/17/2018 Document Reviewed: 06/17/2018 Elsevier Patient Education  2023 Elsevier Inc.   Moderate Conscious Sedation, Adult, Care After This sheet gives you information about how to care for yourself after your procedure. Your health care provider may also give you more specific instructions. If you have problems or questions, contact your health care provider. What can I expect after the procedure? After the procedure, it is common to have: Sleepiness for several hours. Impaired judgment for several hours. Difficulty with balance. Vomiting if   you eat too soon. Follow these instructions at home: For the time period you were told by your health care provider: Rest. Do not participate in activities where you could fall or become injured. Do not drive or use machinery. Do not drink alcohol. Do not take sleeping pills or medicines that  cause drowsiness. Do not make important decisions or sign legal documents. Do not take care of children on your own. Eating and drinking  Follow the diet recommended by your health care provider. Drink enough fluid to keep your urine pale yellow. If you vomit: Drink water, juice, or soup when you can drink without vomiting. Make sure you have little or no nausea before eating solid foods. General instructions Take over-the-counter and prescription medicines only as told by your health care provider. Have a responsible adult stay with you for the time you are told. It is important to have someone help care for you until you are awake and alert. Do not smoke. Keep all follow-up visits as told by your health care provider. This is important. Contact a health care provider if: You are still sleepy or having trouble with balance after 24 hours. You feel light-headed. You keep feeling nauseous or you keep vomiting. You develop a rash. You have a fever. You have redness or swelling around the IV site. Get help right away if: You have trouble breathing. You have new-onset confusion at home. Summary After the procedure, it is common to feel sleepy, have impaired judgment, or feel nauseous if you eat too soon. Rest after you get home. Know the things you should not do after the procedure. Follow the diet recommended by your health care provider and drink enough fluid to keep your urine pale yellow. Get help right away if you have trouble breathing or new-onset confusion at home. This information is not intended to replace advice given to you by your health care provider. Make sure you discuss any questions you have with your health care provider. Document Revised: 05/29/2019 Document Reviewed: 12/25/2018 Elsevier Patient Education  2023 Elsevier Inc.  

## 2024-03-17 NOTE — Procedures (Signed)
Interventional Radiology Procedure:   Indications: Thrombocytopenia  Procedure: CT guided bone marrow biopsy  Findings: 2 aspirates and 1 core from right ilium  Complications: None     EBL: Minimal, less than 10 ml  Plan: Discharge to home in one hour.   Mekala Winger R. Sequita Wise, MD  Pager: [phone]   

## 2024-03-17 NOTE — Progress Notes (Signed)
 1030 Ice bag given to use as needed for comfort to port-a-cath site and upper neck as instructed.

## 2024-03-20 ENCOUNTER — Inpatient Hospital Stay: Payer: Self-pay

## 2024-03-20 LAB — SURGICAL PATHOLOGY

## 2024-04-06 ENCOUNTER — Inpatient Hospital Stay
